# Patient Record
Sex: Male | Born: 1935
Health system: Southern US, Community
[De-identification: ages and names within clinical notes are randomized; demographics above are authoritative.]

## PROBLEM LIST (undated history)

## (undated) DIAGNOSIS — R7301 Impaired fasting glucose: Secondary | ICD-10-CM

## (undated) DIAGNOSIS — H9190 Unspecified hearing loss, unspecified ear: Secondary | ICD-10-CM

## (undated) DIAGNOSIS — I2510 Atherosclerotic heart disease of native coronary artery without angina pectoris: Secondary | ICD-10-CM

## (undated) DIAGNOSIS — Z973 Presence of spectacles and contact lenses: Secondary | ICD-10-CM

## (undated) DIAGNOSIS — I629 Nontraumatic intracranial hemorrhage, unspecified: Secondary | ICD-10-CM

## (undated) DIAGNOSIS — K5790 Diverticulosis of intestine, part unspecified, without perforation or abscess without bleeding: Secondary | ICD-10-CM

## (undated) DIAGNOSIS — I447 Left bundle-branch block, unspecified: Secondary | ICD-10-CM

## (undated) DIAGNOSIS — G479 Sleep disorder, unspecified: Secondary | ICD-10-CM

## (undated) DIAGNOSIS — M4306 Spondylolysis, lumbar region: Secondary | ICD-10-CM

## (undated) DIAGNOSIS — K2970 Gastritis, unspecified, without bleeding: Secondary | ICD-10-CM

## (undated) DIAGNOSIS — I5032 Chronic diastolic (congestive) heart failure: Secondary | ICD-10-CM

## (undated) DIAGNOSIS — G43909 Migraine, unspecified, not intractable, without status migrainosus: Secondary | ICD-10-CM

## (undated) DIAGNOSIS — N289 Disorder of kidney and ureter, unspecified: Secondary | ICD-10-CM

## (undated) DIAGNOSIS — D696 Thrombocytopenia, unspecified: Secondary | ICD-10-CM

## (undated) DIAGNOSIS — Z87898 Personal history of other specified conditions: Secondary | ICD-10-CM

## (undated) DIAGNOSIS — R51 Headache: Secondary | ICD-10-CM

## (undated) DIAGNOSIS — I77810 Thoracic aortic ectasia: Secondary | ICD-10-CM

## (undated) DIAGNOSIS — K279 Peptic ulcer, site unspecified, unspecified as acute or chronic, without hemorrhage or perforation: Secondary | ICD-10-CM

## (undated) DIAGNOSIS — E785 Hyperlipidemia, unspecified: Secondary | ICD-10-CM

## (undated) DIAGNOSIS — I119 Hypertensive heart disease without heart failure: Secondary | ICD-10-CM

## (undated) DIAGNOSIS — K635 Polyp of colon: Secondary | ICD-10-CM

## (undated) DIAGNOSIS — I10 Essential (primary) hypertension: Secondary | ICD-10-CM

## (undated) DIAGNOSIS — R251 Tremor, unspecified: Secondary | ICD-10-CM

## (undated) DIAGNOSIS — I639 Cerebral infarction, unspecified: Secondary | ICD-10-CM

## (undated) DIAGNOSIS — G4700 Insomnia, unspecified: Secondary | ICD-10-CM

## (undated) DIAGNOSIS — K449 Diaphragmatic hernia without obstruction or gangrene: Secondary | ICD-10-CM

## (undated) DIAGNOSIS — R338 Other retention of urine: Secondary | ICD-10-CM

## (undated) DIAGNOSIS — G20A1 Parkinson's disease without dyskinesia, without mention of fluctuations: Secondary | ICD-10-CM

## (undated) DIAGNOSIS — M1990 Unspecified osteoarthritis, unspecified site: Secondary | ICD-10-CM

## (undated) DIAGNOSIS — G20 Parkinson's disease: Secondary | ICD-10-CM

## (undated) DIAGNOSIS — N390 Urinary tract infection, site not specified: Secondary | ICD-10-CM

## (undated) DIAGNOSIS — N401 Enlarged prostate with lower urinary tract symptoms: Secondary | ICD-10-CM

## (undated) HISTORY — PX: ESOPHAGOGASTRODUODENOSCOPY: SHX1529

## (undated) HISTORY — DX: Cerebral infarction, unspecified: I63.9

## (undated) HISTORY — DX: Chronic diastolic (congestive) heart failure: I50.32

## (undated) HISTORY — DX: Diverticulosis of intestine, part unspecified, without perforation or abscess without bleeding: K57.90

## (undated) HISTORY — DX: Peptic ulcer, site unspecified, unspecified as acute or chronic, without hemorrhage or perforation: K27.9

## (undated) HISTORY — DX: Tremor, unspecified: R25.1

## (undated) HISTORY — DX: Personal history of other specified conditions: Z87.898

## (undated) HISTORY — DX: Atherosclerotic heart disease of native coronary artery without angina pectoris: I25.10

## (undated) HISTORY — DX: Thrombocytopenia, unspecified: D69.6

## (undated) HISTORY — DX: Urinary tract infection, site not specified: N39.0

## (undated) HISTORY — DX: Hypertensive heart disease without heart failure: I11.9

## (undated) HISTORY — DX: Essential (primary) hypertension: I10

## (undated) HISTORY — DX: Parkinson's disease without dyskinesia, without mention of fluctuations: G20.A1

## (undated) HISTORY — DX: Hyperlipidemia, unspecified: E78.5

## (undated) HISTORY — DX: Migraine, unspecified, not intractable, without status migrainosus: G43.909

## (undated) HISTORY — PX: CORONARY ANGIOPLASTY: SHX604

## (undated) HISTORY — DX: Parkinson's disease: G20

## (undated) HISTORY — DX: Left bundle-branch block, unspecified: I44.7

## (undated) HISTORY — DX: Disorder of kidney and ureter, unspecified: N28.9

## (undated) HISTORY — DX: Gastritis, unspecified, without bleeding: K29.70

## (undated) HISTORY — DX: Insomnia, unspecified: G47.00

## (undated) HISTORY — PX: WISDOM TOOTH EXTRACTION: SHX21

## (undated) HISTORY — DX: Nontraumatic intracranial hemorrhage, unspecified: I62.9

## (undated) HISTORY — DX: Sleep disorder, unspecified: G47.9

## (undated) HISTORY — PX: CATARACT EXTRACTION, BILATERAL: SHX1313

## (undated) HISTORY — DX: Thoracic aortic ectasia: I77.810

## (undated) HISTORY — DX: Spondylolysis, lumbar region: M43.06

## (undated) HISTORY — DX: Diaphragmatic hernia without obstruction or gangrene: K44.9

## (undated) HISTORY — DX: Impaired fasting glucose: R73.01

## (undated) HISTORY — PX: OTHER SURGICAL HISTORY: SHX169

## (undated) HISTORY — DX: Polyp of colon: K63.5

---

## 1958-06-29 HISTORY — PX: APPENDECTOMY: SHX54

## 1996-06-29 HISTORY — PX: CHOLECYSTECTOMY: SHX55

## 2001-10-27 HISTORY — PX: COLONOSCOPY: SHX174

## 2001-10-31 ENCOUNTER — Ambulatory Visit (HOSPITAL_COMMUNITY): Admission: RE | Admit: 2001-10-31 | Discharge: 2001-10-31 | Payer: Self-pay | Admitting: *Deleted

## 2002-06-02 ENCOUNTER — Encounter: Payer: Self-pay | Admitting: Internal Medicine

## 2002-06-03 ENCOUNTER — Inpatient Hospital Stay (HOSPITAL_COMMUNITY): Admission: EM | Admit: 2002-06-03 | Discharge: 2002-06-07 | Payer: Self-pay | Admitting: Emergency Medicine

## 2002-06-04 ENCOUNTER — Encounter: Payer: Self-pay | Admitting: Internal Medicine

## 2002-06-06 ENCOUNTER — Encounter: Payer: Self-pay | Admitting: Internal Medicine

## 2002-06-08 ENCOUNTER — Emergency Department (HOSPITAL_COMMUNITY): Admission: EM | Admit: 2002-06-08 | Discharge: 2002-06-09 | Payer: Self-pay | Admitting: Emergency Medicine

## 2003-03-04 ENCOUNTER — Encounter: Payer: Self-pay | Admitting: Family Medicine

## 2003-03-04 ENCOUNTER — Encounter: Admission: RE | Admit: 2003-03-04 | Discharge: 2003-03-04 | Payer: Self-pay | Admitting: Family Medicine

## 2003-03-14 ENCOUNTER — Encounter: Admission: RE | Admit: 2003-03-14 | Discharge: 2003-06-12 | Payer: Self-pay | Admitting: Obstetrics and Gynecology

## 2003-06-30 HISTORY — PX: CORONARY ARTERY BYPASS GRAFT: SHX141

## 2003-07-30 ENCOUNTER — Ambulatory Visit (HOSPITAL_COMMUNITY): Admission: RE | Admit: 2003-07-30 | Discharge: 2003-07-30 | Payer: Self-pay | Admitting: Family Medicine

## 2003-11-30 ENCOUNTER — Inpatient Hospital Stay (HOSPITAL_COMMUNITY): Admission: EM | Admit: 2003-11-30 | Discharge: 2003-12-11 | Payer: Self-pay | Admitting: Emergency Medicine

## 2004-01-07 ENCOUNTER — Encounter
Admission: RE | Admit: 2004-01-07 | Discharge: 2004-01-07 | Payer: Self-pay | Admitting: Thoracic Surgery (Cardiothoracic Vascular Surgery)

## 2004-01-14 ENCOUNTER — Encounter (HOSPITAL_COMMUNITY): Admission: RE | Admit: 2004-01-14 | Discharge: 2004-04-13 | Payer: Self-pay | Admitting: Cardiology

## 2004-01-22 ENCOUNTER — Inpatient Hospital Stay (HOSPITAL_COMMUNITY): Admission: EM | Admit: 2004-01-22 | Discharge: 2004-01-24 | Payer: Self-pay | Admitting: *Deleted

## 2004-05-07 ENCOUNTER — Ambulatory Visit (HOSPITAL_COMMUNITY): Admission: RE | Admit: 2004-05-07 | Discharge: 2004-05-07 | Payer: Self-pay | Admitting: Specialist

## 2004-05-10 ENCOUNTER — Inpatient Hospital Stay (HOSPITAL_COMMUNITY): Admission: EM | Admit: 2004-05-10 | Discharge: 2004-05-15 | Payer: Self-pay | Admitting: Emergency Medicine

## 2004-06-25 ENCOUNTER — Ambulatory Visit (HOSPITAL_COMMUNITY): Admission: RE | Admit: 2004-06-25 | Discharge: 2004-06-25 | Payer: Self-pay | Admitting: Specialist

## 2004-06-30 ENCOUNTER — Observation Stay (HOSPITAL_COMMUNITY): Admission: EM | Admit: 2004-06-30 | Discharge: 2004-07-01 | Payer: Self-pay | Admitting: Emergency Medicine

## 2004-09-26 IMAGING — CR DG CHEST 2V
2 series · 2 of 2 positions shown · non-contrast
Comparison: none

CLINICAL DATA: 68 year old status post CABG.  
 TWO VIEW CHEST 
 Comparison 12/08/03.
 Patient has had median sternotomy and CABG.  The cardiac size is upper limits of normal.  No focal consolidations or pleural effusions are identified.  There is minimal bibasilar atelectasis.
 IMPRESSION
 Minimal bibasilar atelectasis.

[view not recorded (1 of 2)]
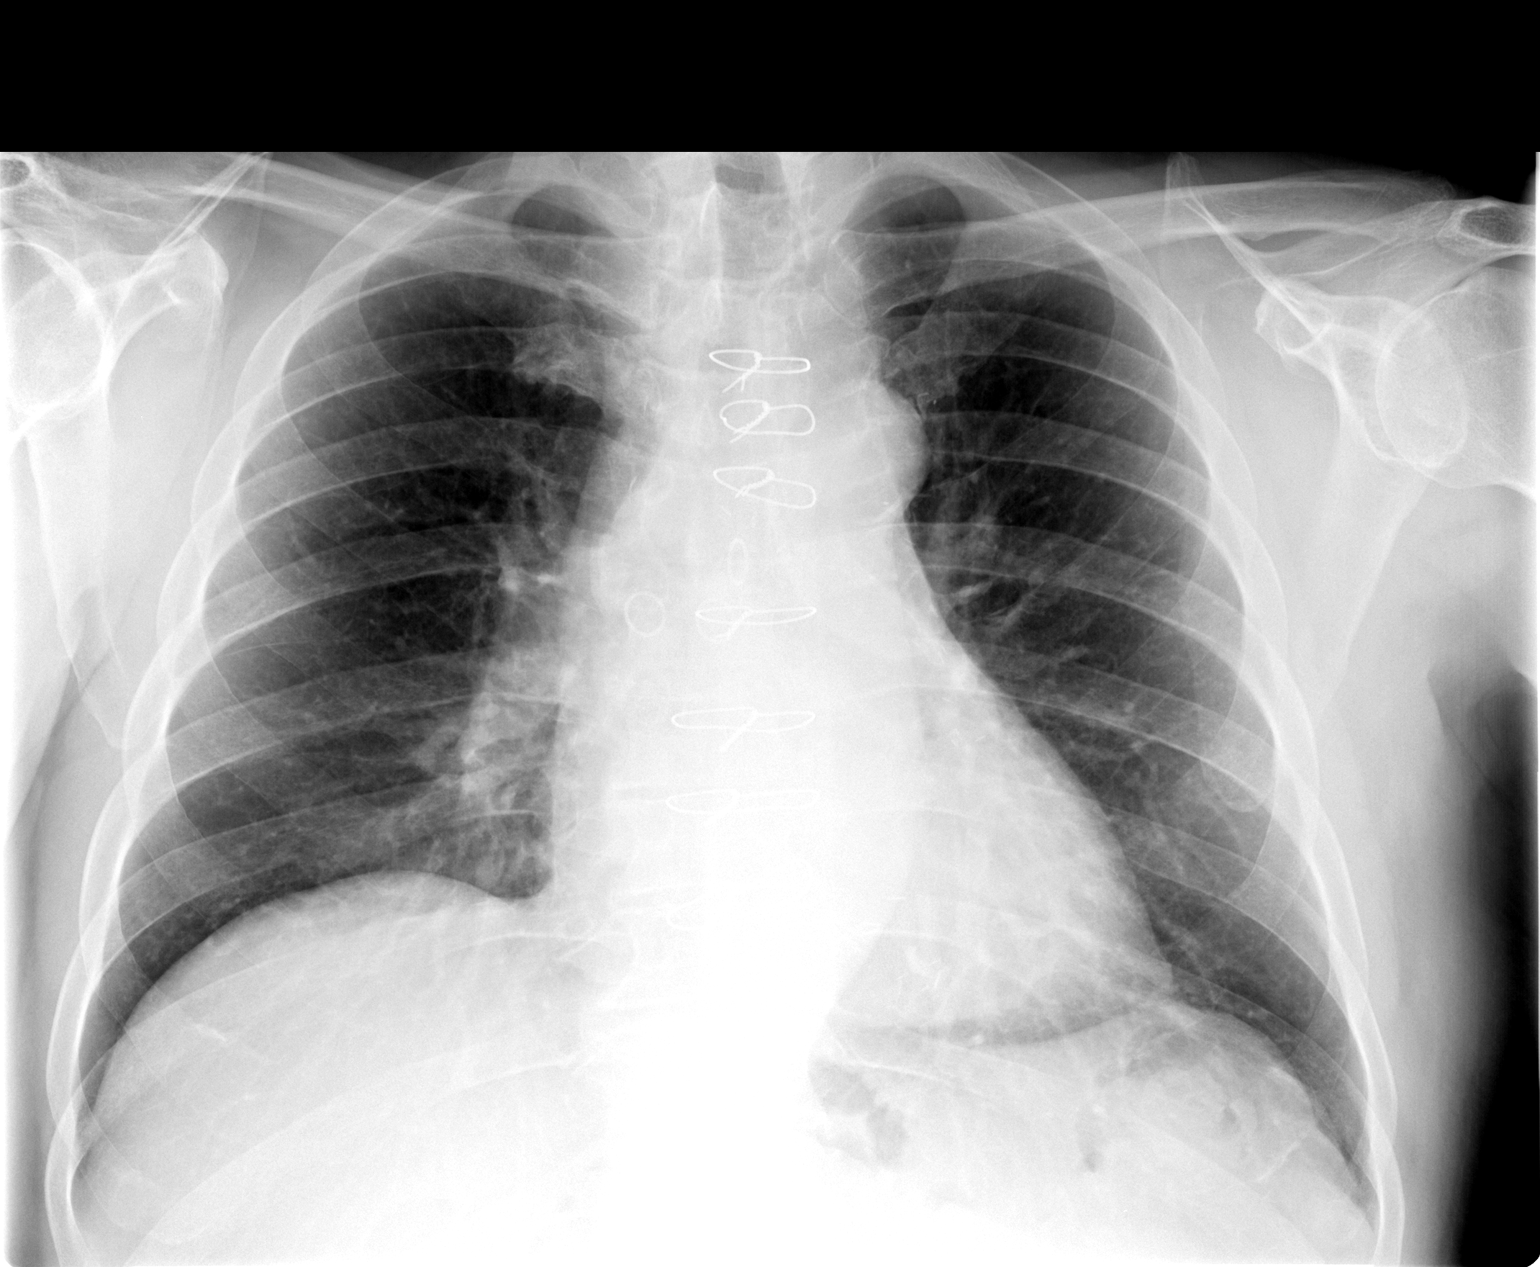

[view not recorded (2 of 2)]
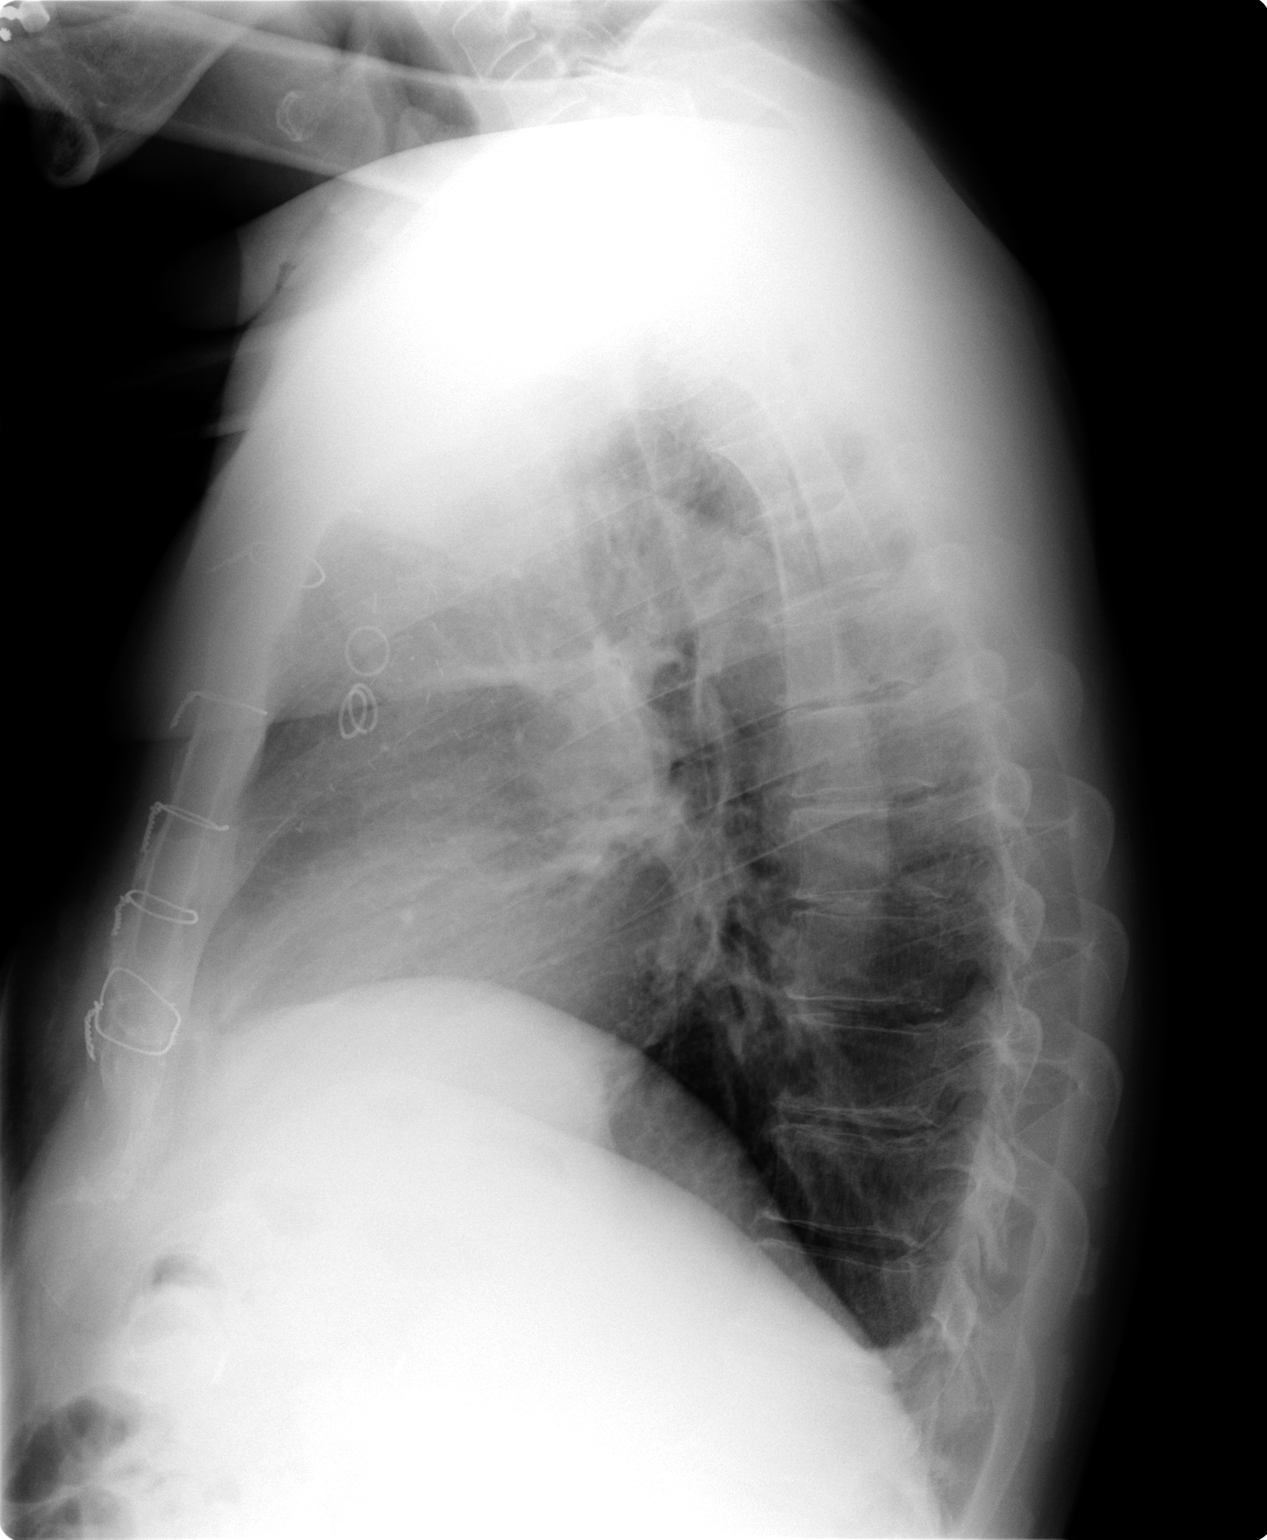

[2 of 2 positions shown; findings below may reference images not displayed]

## 2004-10-06 ENCOUNTER — Observation Stay (HOSPITAL_COMMUNITY): Admission: EM | Admit: 2004-10-06 | Discharge: 2004-10-07 | Payer: Self-pay | Admitting: Emergency Medicine

## 2008-03-08 ENCOUNTER — Encounter: Admission: RE | Admit: 2008-03-08 | Discharge: 2008-03-08 | Payer: Self-pay | Admitting: Gastroenterology

## 2008-03-14 HISTORY — PX: COLON SURGERY: SHX602

## 2008-04-08 ENCOUNTER — Encounter: Admission: RE | Admit: 2008-04-08 | Discharge: 2008-04-08 | Payer: Self-pay | Admitting: Gastroenterology

## 2008-11-15 ENCOUNTER — Encounter: Admission: RE | Admit: 2008-11-15 | Discharge: 2008-11-15 | Payer: Self-pay | Admitting: Cardiology

## 2008-11-19 ENCOUNTER — Ambulatory Visit (HOSPITAL_COMMUNITY): Admission: RE | Admit: 2008-11-19 | Discharge: 2008-11-19 | Payer: Self-pay | Admitting: Cardiology

## 2008-11-29 ENCOUNTER — Ambulatory Visit (HOSPITAL_COMMUNITY): Admission: RE | Admit: 2008-11-29 | Discharge: 2008-11-30 | Payer: Self-pay | Admitting: Interventional Cardiology

## 2009-02-27 HISTORY — PX: SHOULDER ARTHROSCOPY: SHX128

## 2009-03-28 ENCOUNTER — Ambulatory Visit (HOSPITAL_COMMUNITY): Admission: RE | Admit: 2009-03-28 | Discharge: 2009-03-28 | Payer: Self-pay | Admitting: Orthopaedic Surgery

## 2009-03-29 ENCOUNTER — Emergency Department (HOSPITAL_COMMUNITY): Admission: EM | Admit: 2009-03-29 | Discharge: 2009-03-29 | Payer: Self-pay | Admitting: Emergency Medicine

## 2010-07-19 ENCOUNTER — Encounter: Payer: Self-pay | Admitting: Family Medicine

## 2010-07-20 ENCOUNTER — Encounter: Payer: Self-pay | Admitting: Endocrinology

## 2010-09-03 ENCOUNTER — Other Ambulatory Visit: Payer: Self-pay | Admitting: Family Medicine

## 2010-09-03 DIAGNOSIS — R1030 Lower abdominal pain, unspecified: Secondary | ICD-10-CM

## 2010-09-08 ENCOUNTER — Ambulatory Visit
Admission: RE | Admit: 2010-09-08 | Discharge: 2010-09-08 | Disposition: A | Discharge status: Home / Self Care | Payer: 59 | Source: Ambulatory Visit | Attending: Family Medicine | Admitting: Family Medicine

## 2010-09-08 DIAGNOSIS — R1030 Lower abdominal pain, unspecified: Secondary | ICD-10-CM

## 2010-09-08 MED ORDER — IOHEXOL 300 MG/ML  SOLN
125.0000 mL | Freq: Once | INTRAMUSCULAR | Status: AC | PRN
  Administered 2010-09-08: 125 mL via INTRAVENOUS

## 2010-10-02 LAB — URINALYSIS, ROUTINE W REFLEX MICROSCOPIC
Glucose, UA: NEGATIVE mg/dL
Ketones, ur: NEGATIVE mg/dL
pH: 6 (ref 5.0–8.0)

## 2010-10-03 LAB — BASIC METABOLIC PANEL
BUN: 20 mg/dL (ref 6–23)
Chloride: 104 mEq/L (ref 96–112)
GFR calc Af Amer: >60 mL/min (ref >60)
Potassium: 4.4 mEq/L (ref 3.5–5.1)
Sodium: 141 mEq/L (ref 135–145)

## 2010-10-03 LAB — CBC
HCT: 46.9 % (ref 39.0–52.0)
Hemoglobin: 16.3 g/dL (ref 13.0–17.0)
MCV: 95.7 fL (ref 78.0–100.0)
RBC: 4.9 MIL/uL (ref 4.22–5.81)
WBC: 7 10*3/uL (ref 4.0–10.5)

## 2010-10-06 LAB — BASIC METABOLIC PANEL
BUN: 16 mg/dL (ref 6–23)
CO2: 28 mEq/L (ref 19–32)
Chloride: 109 mEq/L (ref 96–112)
Creatinine, Ser: 1.33 mg/dL (ref 0.4–1.5)

## 2010-10-06 LAB — CBC
HCT: 41.1 % (ref 39.0–52.0)
MCHC: 34.8 g/dL (ref 30.0–36.0)
MCV: 95.4 fL (ref 78.0–100.0)
Platelets: 107 10*3/uL — ABNORMAL LOW (ref 150–400)

## 2010-10-07 LAB — CREATININE, SERUM: GFR calc Af Amer: >60 mL/min (ref >60)

## 2010-11-11 NOTE — Cardiovascular Report (Signed)
NAME:  Darryl [name], DIPIERO NO.:  [id number]   MEDICAL RECORD NO.:  [id number]          PATIENT TYPE:  INP   LOCATION:  2506                         FACILITY:  [facility]   PHYSICIAN:  Lyn Records, M.D.   DATE OF BIRTH:  09/08/1934   DATE OF PROCEDURE:  DATE OF DISCHARGE:                            CARDIAC CATHETERIZATION   INDICATION FOR THIS PROCEDURE:  Abnormal Cardiolite with inferior wall  ischemia in the distribution of a large obtuse marginal branch in the  distal circumflex territory supplied by a 90% obstructed vessel.   PROCEDURE PERFORMED:  Bare-metal stent distal circumflex/obtuse marginal  #3.   DESCRIPTION:  The patient and wife had conference with me approximately  1 hour prior to the procedure.  He received 100 mg of Plavix in the  holding area/short stay.  We discussed the possibility of drug-eluting  versus bare-metal stents.  He preferred to do whatever would be best for  him and left it to my judgment.   We placed a 6-French sheath using the modified Seldinger technique into  the right femoral artery after 1% Xylocaine.  A 6-French CLS coronary  guide system was used for the left coronary intervention.  An Angiomax  bolus and infusion was started.  ACT was appropriately elevated above  300.   Asahi Prowater wire was then used to cross the stenosis in the distal  circumflex/third obtuse marginal.  This wire was positioned distally.  We predilated with the 20-mm long Apex 2.5-mm diameter balloon.  We then  deployed a 28-mm long Liberte bare-metal stent.  We postdilated with a  3.25 x 20 mm long Voyager [place] balloon to 13 atmospheres.  We also  postdilated the proximal one half of the stent with a 12-mm long, 3.5-mm  diameter Voyager Nord to 13 atmospheres.  Flow was brisk.  After much  discussion, we did not perform PCI on the mid circumflex because of  multiple branches that arise from this region and also because we did  not feel this lesion was  clinically significant.  The patient tolerated  the procedure without complications.  Occlusion of this vessel produced  no symptoms.  Angio-Seal was used with hemostasis.   CONCLUSION:  1. Successful distal circumflex/obtuse marginal #3 bare-metal stenting      up to 3.5 mm in diameter in the proximal portion of the stent and      3.25 distally with TIMI grade 3 flow.  2. Residual 50% midcircumflex stenosis, not felt to be hemodynamically      significant.  3. No development of symptoms with vessel occlusion.   PLAN:  Aspirin and Plavix for 60 days.  Plavix therapy could then  potentially be discontinued, and the patient's shoulder could be  operated upon.  Further management per Dr. Armanda Magic.      Lyn Records, M.D.  Electronically Signed     HWS/MEDQ  D:  11/29/2008  T:  11/30/2008  Job:  [id number]   cc:   Armanda Magic, M.D.  Anna Genre Little, M.D.

## 2010-11-11 NOTE — Discharge Summary (Signed)
NAME:  Darryl [name], Darryl [name] NO.:  [id number]   MEDICAL RECORD NO.:  [id number]          PATIENT TYPE:  OIB   LOCATION:  2506                         FACILITY:  [facility]   PHYSICIAN:  Lyn Records, M.D.   DATE OF BIRTH:  1934-12-07   DATE OF ADMISSION:  11/29/2008  DATE OF DISCHARGE:  06/74/2010                               DISCHARGE SUMMARY   DISCHARGE DIAGNOSES:  1. Coronary artery disease status post percutaneous coronary      intervention to the circumflex with a bare-metal stent on November 29, 2008, by Dr. Verdis Prime.  2. Dyslipidemia.  3. Hypertension.  4. Known coronary artery disease.  5. History of gastric ulcer.  6. Chronic left bundle-branch block.  7. Normal ejection fraction.   Mr. Code is a 76 year old male patient who had recently been having  exertional fatigue.  He had a nuclear stress test which showed  reversible defect in the inferior wall extending from the base of the  heart.  He initially came into the hospital for cardiac catheterization  on Nov 19, 2008, and this showed three-vessel disease.  Of note, the  patient has coronary artery bypass grafts:  LIMA to LAD, SVG to  diagonal, and these were both patent.  The saphenous vein graft to the  PDA was occluded.  The radial artery to the obtuse marginal 2 was also  occluded.  There was obstruction of the distal circumflex of 90%.  The  patient was brought back to the hospital on June 3, 74 2010, and a bare-  metal stent was implanted in to this area without difficulty.   The patient remained in the hospital overnight and was ready for  discharge to home the following day.   DISCHARGE LABORATORY DATA:  Sodium 140, potassium 4.1, BUN 6, creatinine  1.3.  Hemoglobin 14.3, hematocrit 41.9, white count 6.8, platelets 107.  Last chest x-ray was on Nov 15, 2008, that showed no active  cardiopulmonary disease.   DISCHARGE MEDICATIONS:  1. Plavix 75 mg a day.  2. Enteric-coated aspirin 325 mg a  day.  3. Protonix 40 mg a day.  4. Xanax p.r.n.  5. Avapro daily.  6. Hydrochlorothiazide 25 mg.  7. Lopressor as before.  8. Lisinopril daily.  9. Hytrin 1 a day.  10.Clonidine as before.  11.Fish oil daily.  12.Temazepam 15 mg at bedtime.  13.All vitamins daily.  14.Sublingual nitroglycerin as needed.  15.Imdur 30 mg a day.   The patient is to remain on a low-sodium, heart-healthy diet.  Clean  cath site gently with soap and water.  No scrubbing.  Increase activity  slowly as per Cardiac Rehabilitation.  No lifting over 1 week over 10  pounds.  No driving for 2 days.  Call Dr. Mayford Knife with any further  questions or concerns.  The patient has a followup appointment with her  on December 14, 2008, at 12:45 p.m.  The patient is being discharged to home  in stable but improved condition.      Guy Franco, P.A.      Lyn Records,  M.D.  Electronically Signed    LB/MEDQ  D:  06/74/2010  T:  06/74/2010  Job:  [id number]   cc:   Armanda Magic, M.D.  Anna Genre Little, M.D.

## 2010-11-11 NOTE — Cardiovascular Report (Signed)
NAME:  Darryl [name], Darryl [name] NO.:  [id number]   MEDICAL RECORD NO.:  [id number]          PATIENT TYPE:  OIB   LOCATION:  2899                         FACILITY:  [facility]   PHYSICIAN:  Armanda Magic, M.D.     DATE OF BIRTH:  03-10-1935   DATE OF PROCEDURE:  11/19/2008  DATE OF DISCHARGE:  11/19/2008                            CARDIAC CATHETERIZATION   PROCEDURES:  Left heart catheterization, coronary angiography, LIMA  angiography, saphenous vein graft angiography.   OPERATOR:  Armanda Magic, M.D.   INDICATIONS:  Exertional fatigue, CAD, abnormal myocardial perfusion  scan.   COMPLICATIONS:  None.   IV ACCESS:  Via right femoral artery 6-French sheath.   IV MEDICATIONS:  Versed 1 mg, fentanyl 25 mcg.   This is a very pleasant 76 year old male who has a history of coronary  artery disease, status post coronary artery bypass grafting in early  2005 and then 6 months later was found to have 2 occluded grafts and  underwent radial artery to the OM, PCI as well as saphenous vein graft  to the PDA, PCI by Dr. Katrinka Blazing.  He now presents with exertional fatigue  and a myocardial perfusion scan which showed a reversible defect in the  inferior wall.  He presents now for cardiac catheterization.   The patient was brought to the cardiac catheterization laboratory in the  fasting nonsedated state.  Informed consent was obtained.  The patient  was connected to continuous heart rate and pulse oximetry monitoring and  intermittent blood pressure monitoring.  The right groin was prepped and  draped in a sterile fashion.  Xylocaine 1% was used for local  anesthesia.  Using the modified Seldinger technique, a 5-French sheath  was placed in right femoral artery.  Under fluoroscopic guidance, a 5-  [place] JL4 catheter was placed in the vicinity of the left coronary  artery, but could not engage the coronary ostium.  The catheter was  exchanged out for a 5-French JL5 catheter which  successfully engaged the  coronary ostium.  Multiple cine films were taken at 30-degree RAO and 40-  degree LAO views.  This catheter was then exchanged out over a guidewire  for a 5-French JR4 catheter which engaged the right coronary ostium.  Multiple cine films were taken at 30-degree RAO and 40-degree LAO views.  This catheter was then pulled back into the ostium of the saphenous vein  graft to the obtuse marginal and cine angiography revealed an occluded  vein graft.  Catheter was then pulled out of that graft and placed into  the saphenous vein graft to the diagonal.  Multiple cine films were  taken at 30-degree RAO and 40-degree LAO views.  This catheter was then  pulled out of the vein graft ostium and attempted to be placed into the  vein graft to the PDA without success.  The catheter was then pulled  into the aortic arch and advanced into the left subclavian artery.  It  could not be advanced further than about a centimeter and a long  exchange wire was used again with difficulty in trying to extend the  wire into the subclavian artery.  The catheter was removed over a wire.  A 5-French pigtail catheter was placed under fluoroscopic guidance in  the left ventricular cavity.  Left ventricular pressure was measured.  The catheter was then pulled back across the aortic valve with no  significant gradient noted.  The catheter was then removed over a  guidewire.  Left ventriculography was not performed because of the  amount of contrast that was used for this procedure and the need to  still find the LIMA graft as well as the saphenous vein graft to the  PDA.  After multiple attempts with a 5-French RCB catheter, the vein  graft to the PDA could not be cannulated.  My colleague, Dr. Katrinka Blazing,  stepped in and placed a 5-French multipurpose catheter into the vicinity  of the saphenous vein graft to the PDA which successfully cannulated and  multiple cine films were taken at 30-degree RAO  and 40-degree LAO views  revealing an occluded ostium to the vein graft.  This catheter was then  exchanged out over a guidewire and the femoral sheath was upgraded to a  6-French sheath.  A 6-French JR4 catheter was placed into the aortic  arch and advanced into the left subclavian artery.  Still after multiple  attempts at passing a guidewire, it could not be passed through the  sheath in the subclavian artery.  The wire was exchanged out for a  glidewire which successfully was able to extend into the left subclavian  artery and the catheter was exchanged out for a 5-French LIMA catheter.  After multiple attempts at cannulating the LIMA graft, it was  successfully cannulated.  Multiple cine films were taken at 30-degree  RAO and 40-degree LAO views.  This catheter was then removed over a  guidewire.  At the end of procedure, all catheters and sheaths were  removed.  Manual compression was performed, adequate hemostasis was  obtained.  The patient was transferred back to room in stable condition.   RESULTS:  Left ventricular pressure 157/70 mmHg, aortic pressure 151/72  mmHg, LVEDP 14 mmHg.   The right coronary artery was diffusely diseased throughout the entire  vessel.  In the proximal portion, there was a large aneurysmal component  and proximal and distal to this was diffusely diseased up to 80%.  In  the distal portion, there was an 80% discrete lesion.   The left main coronary artery was widely patent and bifurcated into left  anterior descending artery and left circumflex artery.   Left anterior descending artery had 2 proximal sequential 80% stenoses  and then gave rise to a first diagonal branch.  The diagonal branch was  heavily diseased up to 80% in the proximal portion.  There was no  evidence of retrograde flow through this via saphenous vein graft to the  diagonal graft.  The ongoing LAD was occluded just past the diagonal.   The left circumflex traversed the AV groove  and gave rise to a first  obtuse marginal branch which was with a moderate-sized vessel and widely  patent.  The second obtuse marginal had a 50% ostial stenosis and then  an aneurysmal segment which was rather long and then a 70% stenosis  distal to that.  The left circumflex then appeared to terminate in a  very large posterior descending artery branch where at least a vessel  that had septal perforators coming off of this and extended across the  inferior wall.   The  LIMA to the LAD was widely patent and the LAD was patent past the  graft insertion site.  This radial artery to the OM-2 was occluded.  The  saphenous vein graft to the PDA was occluded.  The saphenous vein graft  to the diagonal was widely patent.   ASSESSMENT:  1. Severe three-vessel coronary artery disease, status post coronary      artery bypass graft, left internal mammary artery graft to the left      anterior descending patent, saphenous vein graft to the diagonal      patent, saphenous vein graft to the posterior descending artery      occluded, radial artery to the obtuse marginal 2 occluded.  2. Mild left ventricular dysfunction by Cardiolite, ejection fraction      48%.  3. Exertional fatigue secondary to severe three-vessel coronary artery      disease.   PLAN:  Films were reviewed with Dr. Katrinka Blazing.  The patient has not had any  angina and only has exertional fatigue.  At this time, since his LIMA  graft and diagonal are patent and there does appear to be flow down the  circumflex and OM system, would manage with anti-anginal medications at  this time.  We will continue beta-blocker, ARB, and  ACE inhibitor.  I will add Imdur 30 mg a day to maximize coronary blood  flow.  He will be discharged to home after IV fluid and bedrest, and  follow up with me in 2 weeks.  If he did not get significant improvement  after adding Imdur, may consider Ranexa.      Armanda Magic, M.D.  Electronically Signed      TT/MEDQ  D:  11/20/2008  T:  11/21/2008  Job:  [id number]   cc:   Caryn Bee L. Little, M.D.

## 2010-11-14 NOTE — H&P (Signed)
NAME:  [name], [name] NO.:  [id number]   MEDICAL RECORD NO.:  [id number]          PATIENT TYPE:  INP   LOCATION:  1832                         FACILITY:  [facility]   PHYSICIAN:  Ulyses Amor, MD DATE OF BIRTH:  Apr 13, 1935   DATE OF ADMISSION:  10/06/2004  DATE OF DISCHARGE:                                HISTORY & PHYSICAL   HISTORY OF PRESENT ILLNESS:  Darryl [name] is a 76 year old white man who  was admitted to [facility] for further evaluation of chest pain.  The patient has a history of coronary artery disease which dates back to  June of 2005.  At that time, he underwent four vessel coronary artery bypass  graft surgery.  In November of 2005, he returned with chest pain.  He  underwent stent placement in the radial graft to the second obtuse marginal  as well as the saphenous vein graft to the right coronary artery.  His  cardiac course has been uncomplicated since then, though, he has continued  to complain of chest pain.  The chest pain that he has complained of in  recent months is a soreness in a focal area in his left anterior upper  chest.  The patient experienced a similar such pain tonight.  However, it  was associated with an ache in the center of his chest.  This began this  morning while he was sitting and lasted throughout the course of the day.  There were no exacerbating or ameliorating factors.  The discomfort appeared  not to be related to position, activity, meals, or respirations.  He  presented to the emergency department.  His discomfort has largely resolved  since then.  What prompted the patient to present to the emergency  department was the associated central component which was a new addition to  his usual left anterior chest discomfort.   The patient has no history of congestive heart failure or arrhythmia.  He  did have a heart attack prior to his coronary artery bypass graft surgery.   The patient has a history of  hypertension.  There is also a strong family  history of early coronary artery disease (father in his 62's as well as a  sister).  There is no history of diabetes mellitus, smoking, or  dyslipidemia.   PAST MEDICAL HISTORY:  Also notable for a gastrointestinal bleed in July of  2005 which was found to be due to gastric ulcers.  He was hospitalized in  January of this year for gastritis, though, he did not bleed at that time.   Also notable for a basal ganglia stroke.   PAST SURGICAL HISTORY:  Cholecystectomy, appendectomy, coronary artery  bypass graft surgery.   FAMILY HISTORY:  His mother's history is unknown.  His father died of a  myocardial infarction in his 35s as noted above.  His sister also has  coronary artery disease.   SOCIAL HISTORY:  The patient lives with his wife.  He neither smokes nor  drinks.  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   MEDICATIONS:  1.  Avapro.  2.  Metoprolol.  3.  Terazosin.  4.  Hydrochlorothiazide.  5.  Plavix.  6.  Protonix.  7.  Darvocet-N 100.   ALLERGIES:  No known drug allergies.   REVIEW OF SYSTEMS:  No new problems related to his head, eyes, ears, nose,  mouth, throat, lungs, gastrointestinal system, genitourinary system, or  extremities.  There is no history of neurologic or psychiatric disorder.  There is no history of fever, chills, or weight loss.   PHYSICAL EXAMINATION:  VITAL SIGNS:  Blood pressure 132/82, pulse 71 and  regular, respirations 18, temperature 98.7.  GENERAL:  The patient is an older white man in no discomfort.  He is alert,  oriented, appropriate, and responsive.  HEENT:  Normal.  NECK:  Without thyromegaly or adenopathy.  Carotid pulses were palpable  bilaterally and without bruits.  HEART:  Normal S1 and S2.  There was no S3, S4, murmur, rub, or click.  Cardiac rhythm was regular.  Palpation of the left anterior upper chest  reproduced the patient's chest discomfort exactly.  LUNGS:  Clear.  ABDOMEN:  Soft and  nontender.  There was no mass, hepatosplenomegaly, bruit,  distention, rebound, guarding, or rigidity.  Bowel sounds were normal.  RECTAL:  GENITOURINARY:  Not performed as they were not pertinent to the  reason for acute care hospitalization.  EXTREMITIES:  Without edema, deviation, or deformity.  Radial and dorsalis  pedal pulses were palpable bilaterally.  NEUROLOGY:  Brief screening neurologic survey was unremarkable.   The electrocardiogram revealed normal sinus rhythm and left bundle branch  block.  Left axis deviation was noted.  The chest radiograph, according to  the radiologist, was normal.   LABORATORY DATA:  Potassium 3.5, BUN 28, and creatinine 2.0.  Initial set of  cardiac markers revealed CK-MB of 1.6, myoglobin 188, and troponin less than  0.05.  The second set revealed the CK-MB of 1.6, myoglobin 171, and troponin  less than 0.05.  The third set revealed a CK-MB of 1.4, myoglobin 176, and  troponin less than 0.05.  The remaining studies were pending at the time of  this dictation.   IMPRESSION:  1.  Chest pain; rule out cardiac ischemia.  There is a musculoskeletal      component.  2.  Coronary artery disease, status post myocardial infarction, status post      coronary artery bypass graft surgery with four grafts in June of 2005,      status post stents to the saphenous vein graft to radial artery graft in      November of 2005.  3.  Hypertension.  4.  Status post gastrointestinal bleed in July of 2005 due to gastric      ulcers; status post gastritis in January of 2006.  5.  Status post basal ganglia stroke.  6.  Renal insufficiency.   PLAN:  1.  Telemetry.  2.  Serial cardiac enzymes.  3.  Aspirin.  4.  Lovenox.  5.  Continue metoprolol and Plavix.  6.  Intravenous nitroglycerin.  7.  Further measures per Dr. Mayford Knife.      MSC/MEDQ  D:  10/06/2004  T:  10/06/2004  Job:  [id number]   cc:   Armanda Magic, M.D. [address], [address]  [place],  [place] [address]  Fax: [phone]

## 2010-11-14 NOTE — H&P (Signed)
NAME:  Darryl [name], Darryl [name] NO.:  [id number]   MEDICAL RECORD NO.:  [id number]                   PATIENT TYPE:  INP   LOCATION:  1825                                 FACILITY:  [facility]   PHYSICIAN:  Melissa L. Ladona Ridgel, MD               DATE OF BIRTH:  1935/06/02   DATE OF ADMISSION:  01/22/2004  DATE OF DISCHARGE:                                HISTORY & PHYSICAL   PRIMARY CARE PHYSICIAN:  C. Duane Lope, M.D.   CHIEF COMPLAINT:  Vomiting coffee-grounds x1 and black stools x3.   HISTORY OF PRESENT ILLNESS:  The patient is a 76 year old white male with a  past medical history for bleeding ulcers approximately four years ago,  related to Celebrex use.  He presents today with feeling quite badly this  morning, followed by two bowel movements that were black in color, followed  around 8:15 to 8:30 a.m. by a third movement and nausea with vomiting of  coffee-grounds.  He states last night he had a headache but felt generally  okay.  The patient is status post a coronary artery bypass graft surgery on  December 05, 2003, and had been taking a baby aspirin since his operation.  It  does not appear that he was taking any PPI's postoperatively.   REVIEW OF SYSTEMS:  Negative for fever, chills, nausea, vomiting.  He did  have diaphoresis with the events today.  He has had no constipation or  diarrhea.  He is sleeping okay.  He has had a weight loss of about 25-30  pounds over the last six months, which has been purposeful.  He has been  participating in cardiac rehab and denies all other review of systems, being  negative.  In particular, he states he has had no shortness of breath or  chest pain.  He does have occasional headaches.   PAST MEDICAL HISTORY:  1. Bleeding ulcers about three to four years ago, seen by Dr. Hyacinth Meeker.  This     was related to Celebrex use.  2. He recently had an MRI of his renal artery to rule out renal artery     stenosis as a secondary cause for  his hypertension.  It was reported by     the patient to be negative.  3. Hypertension.  No diabetes.  4. He has a history of a basal ganglia stroke.   PAST SURGICAL HISTORY:  1. He had a coronary artery bypass graft surgery on December 05, 2003, of two     vessels.  2. His gallbladder was removed five years ago.  3. He has had an appendectomy at age 24.  4. He had a facial melanoma removed.   ALLERGIES:  No known drug allergies.   MEDICATIONS:  1. Centrum one daily.  2. Fish oil 1000 mg daily.  3. Darvocet p.r.n.  4. Aspirin 81 mg daily.  5. Vitamin E just started this  week.  6. Restoril p.r.n.  7. Avapro 300 mg daily.  8. Clonidine 0.3 mg b.i.d.  9. Metoprolol 25 mg b.i.d.  10.      Sular 20 mg p.o. daily at lunch time.   PHYSICAL EXAMINATION:  VITAL SIGNS:  Temperature 98.3 degrees, blood  pressure 146/89, pulse 60, respirations 16.  Saturation 100% on 2 L.  Orthostatics have not been performed at this time.  GENERAL:  He is in no acute distress.  HEENT:  Normocephalic, atraumatic.  Pupils equal, round, reactive to light.  Extraocular muscles intact.  Mucous membranes moist.  His posterior pharynx  is minimally erythematous, status post vomiting.  NECK:  Supple.  No jugular venous distention, no lymph nodes.  CHEST:  Clear to auscultation.  No rales, rhonchi, or wheezes.  Please note  that he has a well-healed sternotomy scar related to his coronary artery  bypass graft surgery.  CARDIOVASCULAR:  A regular rate and rhythm.  Positive S1 and S2.  No S3 or  S4.  No murmurs, rubs, or gallops.  ABDOMEN:  Soft, nontender, nondistended, with positive bowel sounds.  No  guarding or rebound.  RECTAL:  Black stool, no masses noted.  Prostate is smooth.  There is a  little bit of perirectal irritation.  EXTREMITIES:  Show no clubbing, cyanosis, or edema.  He has a well-healed  left forearm scar and right inner thigh scar, related to his coronary artery  bypass graft surgery.   NEUROLOGIC:  Power is 5/5.  Deep tendon reflexes are 2+.  Sensation is  intact grossly.   LABORATORY DATA:  Sodium 141, potassium 4.6, chloride 108, CO2 of 23.9, BUN  43, creatinine 1.1, glucose 106.  White count 10.9, hemoglobin 11.6,  hematocrit 33.1, platelets 143.  PT and INR 13.7 and 1.1 respectively, PTT  25.  His electrocardiogram is pending.   ASSESSMENT/PLAN:  This is a 76 year old white male with new onset melena and  coffee-ground emesis, with a history of bleeding ulcers many years ago,  related to using Celebrex.  Recently he had to restart the aspirin, status  post coronary artery bypass graft surgery, and this week started vitamin E,  when today he developed melanotic stool and nausea with one episode of  vomiting.  1. Gastrointestinal:  The patient is typed and screened.  We will replete     his hemoglobin and hematocrit if his hematocrit drops to less than 25.     We have consulted [facility] for a possible     esophagogastroduodenoscopy.  Protonix intravenously has been started     b.i.d.  He is n.p.o. except for medications now.  2. Genitourinary:  There are no current issues.  I do not feel that a     urinalysis is necessary at this time.  3. Endocrine:  I spoke with Dr. Reather Littler who will hold off on any testing     for adrenal causes for elevation of his blood pressure at this time,     because the patient is under stress.  We will send a discharge summary to     Dr. Lucianne Muss on discharge, so that he can continue to work up the patient's     secondary causes for hypertension.  4. Cardiovascular:  Will continue his home medications with parameters, to     hold for decreased blood pressure or heart rate.  I have spoken with Dr.     Armanda Magic, who has okay'd him for an esophagogastroduodenoscopy at  this time.  Obviously will have to hold his aspirin. 5. Pulmonary:  There appear to be no active issues; however, with the     vomiting episode, I would  like to check a chest x-ray, to rule out     aspiration.  6. PAS hose for deep vein thrombosis prophylaxis at this time.  7. The patient is a full code and full care.                                                Melissa L. Ladona Ridgel, MD    MLT/MEDQ  D:  01/22/2004  T:  01/22/2004  Job:  [id number]   cc:   C. Duane Lope, M.D.  [address]  [place]  [place] [address]  Fax: [phone]   Armanda Magic, M.D.  [address]  [place], [place] [address]  Fax: [phone]   Petra Kuba, M.D.  [address], [address]  [place]  [place] [address]  Fax: [phone]   Reather Littler, M.D.  [address]  [place]  [place] [address]  Fax: [phone]

## 2010-11-14 NOTE — Consult Note (Signed)
NAME:  Darryl [name], Darryl [name] NO.:  [id number]   MEDICAL RECORD NO.:  [id number]                   PATIENT TYPE:  INP   LOCATION:  2313                                 FACILITY:  [facility]   PHYSICIAN:  Salvatore Decent. Cornelius Moras, M.D.              DATE OF BIRTH:  Oct 20, 1934   DATE OF CONSULTATION:  12/04/2003  DATE OF DISCHARGE:                                   CONSULTATION   REFERRING PHYSICIAN:  Dr. Armanda Magic.   PRIMARY CARE PHYSICIAN:  Dr. Vianne Bulls.   REASON FOR CONSULTATION:  Severe three-vessel coronary artery disease with  class IV unstable angina.   HISTORY OF PRESENT ILLNESS:  Darryl [name] is a 76 year old gentleman from  [place] with no previous history of coronary artery disease but risk  factors including history of hypertension, borderline hyperlipidemia, and a  strong family history of coronary artery disease.  He was in his usual state  of good health until this past Friday.  That afternoon, he had been working  in his yard, gardening, doing some fairly strenuous work.  He developed a  prolonged episode of tingling and sharp pain radiating across his chest to  both arms.  He stopped and rested and the pain seemed to go away.  He again  started to work and the pain returned.  It was associated with severe  shortness of breath and prolonged episode of pain.  He subsequently  presented to his primary care physician's office and was referred directly  to the emergency department for further management.  On arrival, he was  noted to have mildly elevated cardiac enzymes with a new left bundle branch  block on 12-lead electrocardiogram.  He was admitted to the hospital.  He  was also notably severely hypertensive.  His blood pressure was brought  under control and he was treated with heparin.  His chest pain symptoms all  resolved.  His cardiac enzymes peaked and were mildly abnormal with a peak  CK-MB fraction of 5.2, with a peak troponin I of 1.14.   Darryl [name]  underwent elective cardiac catheterization yesterday by Dr. Mayford Knife.  Findings at the time of catheterization are notable for severe three-vessel  coronary artery disease with mild left ventricular dysfunction.  This  afternoon, cardiac surgical consultation has been requested to consider  surgical revascularization.   REVIEW OF SYSTEMS:  GENERAL:  The patient reports feeling well up until this  past Friday.  He has had a good appetite.  He has lost over 20 pounds in  weight over the last 6 months on a new diet program.  CARDIAC:  The patient  denies any episodes of similar chest pain prior to that which developed this  week.  He remains active physically and has not had exertional chest pain,  chest tightness, nor shortness of breath.  He states that he only gets short  of breath if he pushes himself  with strenuous activity.  He denies any PND,  orthopnea, or lower extremity edema.  He denies any palpitations or syncopal  episode.  RESPIRATORY:  Negative.  The patient has no recent productive  cough, hemoptysis, wheezing.  GASTROINTESTINAL:  Negative.  The patient  reports normal bowel function.  He has no difficulty swallowing.  He denies  recent history of hematochezia, hematemesis, melena.  MUSCULOSKELETAL:  Negative.  The patient reports only mild arthralgias in both hands.  NEUROLOGIC:  Negative.  The patient denies any episodes of transient  monocular blindness or transient numbness or weakness involving either upper  or lower extremities.  The patient denies a history of seizures.  ENDOCRINE:  Negative.  The patient denies a history of diabetes or thyroid disorder.  INFECTIOUS:  Negative.  The patient denies recent fevers or chills.  PSYCHIATRIC:  Negative.  The patient denies recent problems with stress or  anxiety.  HEENT:  Negative.   PAST MEDICAL HISTORY:  1. Hypertension, difficult to control.  2. Remote history of peptic ulcer disease.  3. Borderline  hyperlipidemia.  4. History of reported tiny lacunar cerebral infarction related to     hypertension several years ago with no residual neurologic sequelae.   PAST SURGICAL HISTORY:  1. Appendectomy.  2. Cholecystectomy.   FAMILY HISTORY:  Family history is notable for the patient's father passed  away from acute myocardial infarction at the age of 8.  The patient's  sister has had myocardial infarction and is age 59.   SOCIAL HISTORY:  The patient is married and lives with his wife and 1  daughter here in [place].  He is semi-retired, although he still works  part-time in [employer].  He is a nonsmoker and  reports that he smoked a very brief period of time in his youth but has been  a nonsmoker for most of his life.  He denies history of excessive alcohol  consumption.   MEDICATIONS PRIOR TO ADMISSION:  1. Clonidine 0.3 mg p.o. t.i.d.  2. Avapro 300 mg p.o. daily.  3. Aspirin 81 mg p.o. every Monday, Wednesday and Friday.   DRUG ALLERGIES:  None known.   PHYSICAL EXAM:  GENERAL:  Physical exam is notable for a well-appearing male  who appears his stated age in no acute distress.  VITAL SIGNS:  Blood pressure is currently 150/80 and he is in normal sinus  rhythm by telemetry monitor.  He is 5-foot 8-1/2-inches tall and weighs 210  pounds.  HEENT:  Exam is grossly unrevealing.  NECK:  The neck is supple.  There is no cervical nor supraclavicular  lymphadenopathy.  There is no jugular venous distention.  No carotid bruits.  CHEST:  Auscultation of the chest includes clear and symmetrical breath  sounds bilaterally.  No wheezes or rhonchi are demonstrated.  CARDIOVASCULAR:  Exam reveals a regular rate and rhythm.  No murmurs, rubs,  or gallops are noted.  ABDOMEN:  The abdomen is slightly obese but soft and nontender.  The liver  edge is not palpable.  Bowel sounds are present.  There are no palpable  masses. EXTREMITIES:  The extremities are warm  and well-perfused.  There is no lower  extremity edema.  There is no sign of significant venous insufficiency.  Distal pulses are intact and easily palpable in both lower legs at the  ankle.  There is intact palmar arch circulation in the left hand with brisk  capillary refill during occlusion of the left  radial pulse.  RECTAL AND GU:  Exams are both deferred.  NEUROLOGICAL:  Examination is grossly nonfocal and symmetrical throughout.   DIAGNOSTIC TESTS:  Cardiac catheterization performed by Dr. Mayford Knife has been  reviewed.  This demonstrates critical three-vessel coronary artery disease  with mild left ventricular dysfunction.  Specifically, there is a long-  segment ulcerated 90% proximal stenosis of the left anterior descending  coronary artery, both before and after takeoff of the first diagonal branch.  There is 95% to 99% stenosis of the left anterior descending coronary artery  within the middle of this longer-segment tubular stenosis.  There is 70% to  80% proximal stenosis of a large second circumflex marginal branch.  There  is 50% to 60% proximal stenosis of the first diagonal branch.  There is 80%  to 90% ostial stenosis of the right coronary artery with large aneurysmal  post-stenotic dilatation of the proximal right coronary artery.  There is  70% to 80% stenosis in the midportion of this vessel.  There is 95% to 99%  high-grade stenosis at the proximal portion of the posterior descending  coronary artery.  Left ventricular function is mildly reduced with mild-to-  moderate inferoapical hypokinesis.  There is no mitral regurgitation.   IMPRESSION:  Critical three-vessel coronary artery disease with class IV  unstable angina, mild acute non-Q-wave myocardial infarction, longstanding  hypertension which has not been well-controlled at the time of this  hospitalization.  I believe that Mr. Cerasoli would best be treated with  elective coronary artery bypass grafting.   PLAN:  I  have outlined the options at length with Mr. Sudduth.  Alternative  treatments strategies have been reviewed.  He understands and accepts all  associated risks of surgery including, but not limited to, risks of death,  stroke, myocardial infarction, congestive heart failure, respiratory  failure, pneumonia, bleeding requiring blood transfusion, arrhythmia,  infection, and recurrent coronary artery disease.  All of his questions have  been addressed.                                               Salvatore Decent. Cornelius Moras, M.D.    CHO/MEDQ  D:  12/04/2003  T:  12/05/2003  Job:  [id number]   cc:   Armanda Magic, M.D.  [address]  [place], [place] [address]  Fax: [phone]   C. Duane Lope, M.D.  [address]  [place]  [place] [address]  Fax: [phone]

## 2010-11-14 NOTE — Cardiovascular Report (Signed)
NAME:  Darryl [name], Darryl [name] NO.:  [id number]   MEDICAL RECORD NO.:  [id number]          PATIENT TYPE:  INP   LOCATION:  6527                         FACILITY:  [facility]   PHYSICIAN:  Armanda Magic, M.D.     DATE OF BIRTH:  12/22/1934   DATE OF PROCEDURE:  05/10/2004  DATE OF DISCHARGE:  05/15/2004                              CARDIAC CATHETERIZATION   REFERRING PHYSICIAN:  C. Duane Lope, M.D.   PROCEDURES:  1.  Left heart catheterization.  2.  Coronary angiography.  3.  Left ventriculography.   OPERATOR:  Armanda Magic, M.D.   INDICATIONS:  Chest pain, abnormal Cardiolite.   COMPLICATIONS:  None.   INTRAVENOUS ACCESS:  The right femoral artery, 6-French sheath.   This is a very pleasant 76 year old white male with a previous history of  coronary disease.  He is status post coronary bypass grafting, in June 2005,  and now presents with recurrent chest pain.  A stress Cardiolite study  showed a mild reversible inferior wall defect.   The patient is brought to the cardiac catheterization laboratory in a  fasting nonsedated state.  Informed consent was obtained.  The patient was  connected to the continuous heart rate and pulse oximetry monitoring,  intermittent blood pressure monitoring.  The right groin was prepped and  draped in a sterile fashion.  Lidocaine 1% was used for local anesthesia.  Using the modified Seldinger technique, a 6-French sheath was placed in the  right femoral artery.  Under fluoroscopic guidance, a 6-French JL4 catheter  was placed in the left coronary artery.  Multiple cine films were taken at  30-degree RAO and 40-degree LAO views.  His catheter was then exchanged out  of the guidewire for a 6-French JR-4 catheter which was placed under  fluoroscopic guidance to the right coronary artery.  Multiple cine films  were taken at 30 degree RAO, 40 degree LAO views.  The catheter was then  pulled back and advanced into the saphenous vein graft of  the diagonal  graft.  Multiple cine films were taken at 30-degree RAO and 40-degree LAO  views.  This catheter was then pulled back and advanced into the saphenous  vein graft at the PDA.  Multiple cine films were taken at the 30-degree RAO  and 40-degree LAO views.  The catheter was then pulled back and advanced  into the radial artery graft of the OM-2 and multiple cine films were taken  at the 30-degree RAO and 40-degree LAO views.  The catheter was then  advanced into the left subclavian artery and then into the left internal  mammary artery.  Multiple cine films were taken in the 30-degree RAO and 40-  degree LAO views.  The catheter was then removed over a guidewire and a 6-  French angled pigtail catheter was placed under fluoroscopic guidance in the  left ventricular cavity.  Left ventriculography was performed in the 30  degree RAO view and a total of 30 mL of contrast at 15 mL/sec.  The catheter  was then pulled back across the aortic valve with no significant gradient  noted.  At the end of the procedure, all catheter sheaths were removed.  Manual compression was performed until adequate hemostasis was obtained.  The patient was transferred back to room in stable condition.   ASSESSMENT:  1.  Severe native three-vessel coronary disease.  2.  Saphenous vein graft to the diagonal widely patent with luminal      irregularities.  3.  Saphenous vein graft to the posterior descending artery with a 50-60%      proximal stenosis in the graft.  4.  Radial artery to the obtuse marginal 2, 70% ostial  stenosis at the      insertion of the aorta and the graft.  5.  Left internal mammary artery to the left anterior descending artery      widely patent.  6.  Normal left ventricular systolic function.  7.  Aortic pressure was 162/54-mmHg.  8.  Left ventricular pressure 162/4-mmHg.   ASSESSMENT:  1.  Severe three-vessel native coronary disease.  2.  Borderline stenosis of the saphenous  vein graft to the posterior      descending artery with obstructive lesions at the ostium of the radial      artery to the obtuse marginal 2.  3.  Normal left ventricular function.   PLAN:  1.  PCI of the radial artery to the OM-2 graft on May 13, 2005.  Due to      increased use of IV contrast today, would recommend proceeding in 24      hours, after IV fluid hydration.  2.  We will admit to a telemetry bed.  3.  Dr. Katrinka Blazing will perform PCI of the radial artery graft to the OM and      possible saphenous vein graft to the RCA.  He will determine at the time      of that cath whether that lesion is significant.      TT/MEDQ  D:  07/12/2004  T:  07/12/2004  Job:  [id number]   cc:   C. Duane Lope, M.D.  [address]  [place]  [place] [address]  Fax: [phone]

## 2010-11-14 NOTE — Discharge Summary (Signed)
NAME:  Darryl [name], Darryl [name] NO.:  [id number]   MEDICAL RECORD NO.:  [id number]          PATIENT TYPE:  INP   LOCATION:  6527                         FACILITY:  [facility]   PHYSICIAN:  Armanda Magic, M.D.     DATE OF BIRTH:  03-10-1935   DATE OF ADMISSION:  05/10/2004  DATE OF DISCHARGE:  05/15/2004                                 DISCHARGE SUMMARY   ADMITTING PHYSICIAN:  Dr. Amil Amen   CARDIOLOGIST:  Dr. Armanda Magic   CONSULTANTS:  Dr. Randa Evens with gastroenterology.   CHIEF COMPLAINT AND REASON FOR ADMISSION:  Darryl [name] is a 76 year old male  patient status post coronary artery bypass grafting in June of 2005  presented to the ER with chest tightness spontaneously.  Had had a similar  episode the day before and because he had an episode on the day of admission  he came to the ER.  He was given sublingual nitroglycerin x3 tablets with  very slow relief of chest discomfort.  Upon further questioning, the patient  revealed he had been having these spells of chest pain for at least 2-3  weeks, no associated symptoms, no radiation of the pain.   In addition to having the above-stated coronary history, he also has a  history of peptic ulcer induced by aspirin in July of 2005 and his  medications prior to admission did not include aspirin therapy because of  this.  On physical exam his initial blood pressure was somewhat elevated at  170/87 down to 140/90 after sublingual nitroglycerin but then back up again  to 190/107, heart rate 55, pulse rate 99 and he was saturating 99% on room  air.  Physical exam was otherwise unremarkable.  His EKG showed no ischemic  changes but a left bundle branch block, no significant abnormalities of his  CBC or BMET and point of care markers were negative x2 upon initial  evaluation.  Dr. Amil Amen admitted this patient to the medical telemetry unit  with the following diagnoses:  (1) Known coronary artery disease status post  coronary artery  bypass graft x4 in June of 2005 now with unstable angina  pectoris; (2) intolerance to aspirin secondary to peptic ulcer disease,  questionable contribute to possible early graft closures; (3) hypertension  currently uncontrolled.   HOSPITAL COURSE:  Problem 1.  UNSTABLE ANGINA IN A PATIENT WITH KNOWN  CORONARY ARTERY DISEASE:  The patient was admitted to the medical telemetry  unit.  He was started on transdermal nitroglycerin every 6 hours.  He was  given a dose of hydralazine initially to get his blood pressure down.  He  was started on Lovenox q.12h.  He was given one dose of aspirin and then  started on Plavix as per usual acute coronary syndrome rule out MI protocol.  Cardiac isoenzymes were also checked serially.  These were negative.  He was  still having mild episodes of chest discomfort throughout the weekend.  He  had a Cardiolite study done which showed a mild reversible inferior defect  and recommendations were to proceed with coronary angiography.  On May 12, 2004, Dr.  Turner performed a left heart catheterization on Darryl [name]  which showed severe three-vessel native disease as was known prior to CABG,  borderline stenosis of the saphenous vein graft to the PDA, as well as an  obstructive lesion at the ostium of the radial artery graft to the OM-2.  Because of this, interventional cardiology consult was obtained by Dr. Verdis Prime.  The angiograms were reviewed and again there were issues concerning  safety of antiplatelet treatment and duration given his issue of peptic  ulcer disease.  Therefore, Dr. Katrinka Blazing requested that GI evaluate the patient  to determine if it would be safe to proceed with anticoagulative therapy.  If not there were some concerns about whether PCI and stent placement should  even be considered.   On May 12, 2004, Dr. Randa Evens with gastroenterology did evaluate this  patient and on May 13, 2004 the patient was taken to the endoscopy  lab  where gastric erosion and some mild gastritis was found without significant  ulcers and Dr. Randa Evens deemed the patient appropriate to proceed with low-  dose aspirin, Plavix therapy and to proceed with stent placement as well, as  indicated, and on May 14, 2004 the patient was taken to the cath lab by  Dr. Katrinka Blazing at which point the patient had 2 TAXUS stents placed, one stent to  the radial artery graft of the obtuse marginal and another one to the SVG of  the RCA.  The patient tolerated the procedure well without chest pain or EKG  changes.  He received Angiomax in the cath lab and was started back on low-  dose aspirin and Plavix with recommendations from Dr. Katrinka Blazing to continue  Plavix for a minimum of 6 months preferably for 1 year as long as no  gastrointestinal problems such as GI bleeding or ulcer formation occurred.   Problem 2.  HYPERTENSION:  During the hospitalization, the patient's blood  pressure remained relatively well controlled except as upon initial  admission while he was having chest pain.  Again, he had some issues post  cath where he was unable to void and had a headache.  Both of the  intermittent increases in blood pressure responded well to p.r.n. dosing of  hydralazine.  On date of discharge, the patient's blood pressure was very  well controlled at 115/58.  His heart rate was slightly low at 52, thereby  limiting any possibility of increasing beta-blocker dosages if blood  pressure becomes uncontrolled.  Dr. Mayford Knife has recommended that if his blood  pressure becomes significantly elevated in the future that we can increase  his Hytrin.  We will continue same doses of clonidine, Maxzide, and Avapro  since these doses are at max level.   Problem 3.  HYPOKALEMIA:  The patient has demonstrated intermittent  hypokalemia with potassium's around 3.3 to 3.4 since admission.  He is on  diuretic therapy for hypertension and therefore low-dose potassium  repletion daily has been ordered at discharge.  The patient will have a BMET checked  in 1 week.   Problem 4.  GASTRIC EROSIONS:  Please see dictation under chest pain.  Again, Dr. Randa Evens did perform endoscopy on this patient which revealed  gastric erosions without ulcers.  He has been started on Protonix 40 mg  b.i.d.  The usual standard of care is to at least treat for 4 to 8 weeks and  follow up with endoscopy afterwards to determine if additional PPI therapy  is indicated.  I  suspect with the patient's need for continued aspirin and  Plavix he will at least be on a minimum dosage of Protonix 40 mg daily if  not b.i.d. for the next 12 months.  H. pylori biopsies were also taken in  the endoscopy lab and these were negative for any evidence of H. pylori  overgrowth.   Problem 5.  MILD THROMBOCYTOPENIA:  The patient presented with a normal  platelet count of 153,000.  He briefly began to trend down with a platelet  count as low as 136,000 on November 16th.  On the date of discharge, his  platelet count is 140,000.  No evidence of petechial rashes or any bleeding.  We will follow up on platelet count p.r.n. while the patient is on Plavix.   FINAL DISCHARGE DIAGNOSES:  1.  Unstable angina pectoris in a patient with known coronary artery bypass      grafting in June of 2005.  2.  Status post percutaneous coronary intervention and TAXUS stent placement      x2 to the radial graft to the second obtuse marginal as well as the      saphenous vein graft of the right coronary artery.  3.  Hypertension currently, controlled.  4.  Mild hypokalemia secondary to Maxzide.  5.  Mild thrombocytopenia, improving.  6.  Gastric erosions, resumed on b.i.d. proton-pump inhibitor by Dr.      Randa Evens.   DISCHARGE MEDICATIONS:  1.  Hytrin 1 mg at bedtime.  2.  Plavix 75 mg daily.  3.  Colace 100 mg daily, hold if loose stools, can probably change to p.r.n.  4.  Catapres 0.3 mg b.i.d.  5.  Maxzide  37.5/25 one daily.  6.  Avapro 300 mg daily.  7.  Lopressor 25 mg twice daily.  8.  Protonix 40 mg twice daily.  9.  Aspirin 81 mg daily.  10. K-Dur 20 mEq daily.   ACTIVITY:  No lifting greater than 10 pounds for the next 2 days.  Pending  discussion with Dr. Mayford Knife, the patient is awaiting decision if he can go  ahead and travel over the Thanksgiving holiday weekend.   DIET:  Heart healthy.   WOUND CARE:  Call our office if there is any increased swelling or bruising  at the catheterization site.   FOLLOW UP APPOINTMENTS:  You are to call Dr. Randa Evens office regarding follow  up appointments within the next 4-6 weeks.  With Dr. Mayford Knife, you have an  appointment to see her on Monday, November 28th, at 3 p.m.  Before that, you  are to report to the [facility] lab to have a BMET checked at 2:30 p.m.   ADDITIONAL RECOMMENDATIONS:  I do not have all the records from the office  but unclear if this patient has ever been on statin therapy in the past. Will defer follow up on this to Dr. Mayford Knife at the office.  If has not had a  fasting lipid panel checked recently, would recommend doing that at a time  to be determined later by Dr. Mayford Knife.      Alli   ALE/MEDQ  D:  05/15/2004  T:  05/15/2004  Job:  [id number]   cc:   Lyn Records III, M.D.  [address]  [place]  [place] [address]  Fax: [phone]   Llana Aliment. Malon Kindle., M.D.  [address]  [place]  [place] [address]  Fax: [phone]

## 2010-11-14 NOTE — Op Note (Signed)
NAME:  Darryl [name], Darryl [name] NO.:  [id number]   MEDICAL RECORD NO.:  [id number]                   PATIENT TYPE:  INP   LOCATION:  4732                                 FACILITY:  [facility]   PHYSICIAN:  Petra Kuba, M.D.                 DATE OF BIRTH:  [birth year]   DATE OF PROCEDURE:  01/22/2004  DATE OF DISCHARGE:                                 OPERATIVE REPORT   PROCEDURE:  Esophagogastroduodenoscopy with biopsy.   INDICATIONS FOR PROCEDURE:  Upper GI bleeding.  Consent was signed after  risks, benefits, methods, and options were thoroughly discussed.   PROCEDURE:  The video endoscope was inserted by direct vision.  The  esophagus was normal.  He did have a small hiatal hernia.  The scope was  passed into the stomach, no blood was seen.  It was advanced to the antrum  where two small distal antral ulcers were seen with no visible vessel.  These ulcers were washed and watched multiple times and could not be made to  bleed.  The water that was injected was then suctioned from the proximal  stomach.  The scope passed through a normal pylorus into a normal duodenal  bulb and around the C-loop to a normal second portion of the duodenum.  No  blood was seen distally.  The scope was then withdrawn back to the bulb.  I  could look there and rule out ulcers in that location.  The scope was  withdrawn back to the stomach and retroflexed.  Angularis, cardia, fundus,  lesser, and greater curve were all normal on retroflex visualization.  Straight visualization of the stomach did not reveal any additional  findings.  Again, we rewashed and watched the ulcers but could not make them  bleed.  Water was suctioned, the scope was slowly withdrawn again, a good  look at the esophagus was normal.  Prior to withdrawing the scope, we did  take one biopsy of the antrum for the CLOtest to rule out Helicobacter but  he was CLOtest negative a few years ago during his last endoscopy.   The  scope was removed.  The patient tolerated the procedure well.  There was no  obvious immediate complications.   ENDOSCOPIC DIAGNOSIS:  1. Small hiatal hernia.  2. Two antral ulcers with flat, white bases, minimal erythema around the     edges.  3. Otherwise, normal EGD without any signs of bleeding status post CLOtest     biopsy of the antrum.   PLAN:  Hold aspirin and begin pump inhibitors, await CLOtest biopsy, clear  liquids OK and if no signs of further bleeding, can advance tomorrow.  Repeat EGD p.r.n.  Will have to discuss with cardiology and CVTS how long it  is  OK to hold his aspirin for and then as long as on aspirin in the future,  will need pump inhibitors to prevent recurrence.  Possibly, if we can hold  the aspirin for a month or two, we can document healing with repeat  endoscopy and then restart it with pump inhibitor protection.                                               Petra Kuba, M.D.    MEM/MEDQ  D:  01/22/2004  T:  01/22/2004  Job:  [id number]   cc:   Armanda Magic, M.D.  [address]  [place], [place] [address]  Fax: [phone]   Salvatore Decent. Cornelius Moras, M.D.  [address]  [place]  [place] [address]

## 2010-11-14 NOTE — Discharge Summary (Signed)
NAME:  Darryl [name], Darryl [name] NO.:  [id number]   MEDICAL RECORD NO.:  [id number]                   PATIENT TYPE:  INP   LOCATION:  4732                                 FACILITY:  [facility]   PHYSICIAN:  Isla Pence, M.D.             DATE OF BIRTH:  Oct 20, 1934   DATE OF ADMISSION:  01/22/2004  DATE OF DISCHARGE:  01/24/2004                                 DISCHARGE SUMMARY   DISCHARGE DIAGNOSES:  1. Upper gastrointestinal bleed with two antral ulcers noted on     esophagogastroduodenoscopy that were not actively bleeding.  Note that     this current problem is also noted to be secondary to recent aspirin use.     Dr. Regino Schultze had recommended holding off the aspirin for a month or two if     possible, and if it needs to be restarted they can repeat his endoscopy     to document healing with him needing to be on proton pump inhibitors,     such as Protonix.  2. Previous history of bleeding ulcers about 3 or 4 years ago related to     Celebrex use.  3. History of hypertension with periods of uncontrolled hypertension that is     in the midst of being worked up by Dr. Reather Littler.  The patient has had a     MRI recently.  Please refer to the initial H&P dictated by Dr. Ladona Ridgel for     details.  4. History of basal ganglia stroke.  5. History of headaches for which he takes Darvocet.   PAST SURGICAL HISTORY:  1. Status post coronary artery bypass surgery on December 05, 2003.  2. History of gallbladder surgery, removed about five years ago.  3. Status post appendectomy at age 76.  4. Status post removal of facial melanoma.   DISCHARGE MEDICATIONS:  The patient is to resume all of his home medications  consisting of:  1. Centrum p.o. daily.  2. Fish oil 1000 mg p.o. daily.  3. Darvocet-N 100 one to two tablets p.o. q.4-6h. p.r.n. for headache.  4. Vitamin E that he had just started on daily.  5. Restoril p.r.n.  6. Avapro 300 mg p.o. daily.  7. Clonidine 0.3 mg  p.o. b.i.d.  8. Metoprolol 25 mg p.o. b.i.d.  9. Sular 20 mg p.o. daily at lunchtime.  10.      Protonix 40 mg p.o. b.i.d. x2 weeks, and then after that once a     day, and he will need to stay on this.  If the patient is going to be     placed back on aspirin he will need to be on Protonix for the entire     duration of aspirin or any anti-inflammatory medications.  11.      The patient has been advised to hold off on aspirin.  Further     discussion as for the  need for aspirin will be made with him and Dr.     Mayford Knife.  I have recommended to the patient in light of his coronary     artery disease that he would benefit from being on low dose aspirin after     the initial month or so.  Gastroenterology, Dr. Regino Schultze, who had     initially seen him and done his scope had also recommended being off of     aspirin for a month or two if that is possible.   ACTIVITY:  As tolerated.   DIET:  Low fat, low salt.   FOLLOWUP:  1. The patient is to keep his appointment with Dr. Armanda Magic with     cardiology this Monday, which is January 28, 2004.  2. The patient is to have a followup with his primary care physician, Dr.     Duane Lope, in one week.  3. The patient is to follow up with Dr. Reather Littler in about 3 to 4 weeks.   CONSULTATIONS:  [facility].  Dr. Regino Schultze had seen him and had done his EGD.  Endoscopy findings showed a small hiatal hernia, and two antral ulcers with  flat white spaces with minimal erythema around the edges.  Otherwise, it was  normal EGD without any signs of bleeding.  The patient is status post CLO  test biopsy of the antrum.  The CLO test biopsy is still pending at the time  of discharge, and this needs to be followed up by Dr. Tenny Craw, his primary care  physician.   HOSPITAL COURSE:  This 76 year old gentleman was admitted to the [facility] Service status post coffee-ground emesis and also melena.  Please refer to the initial H&P dictated by Dr. Derenda Mis  for details  of that presentation.  The patient was admitted to the [facility], was placed on telemetry, and had serial H&H done.  His initial  hemoglobin was 11.6 and 33.1.  He was also started on IV fluids, although  his blood pressure was holding up sufficiently with his initial blood  pressure of 146/89.  Gastroenterology was consulted on that first day  itself, and Dr. Regino Schultze who had seen him that same day took him to the  endoscopy suite for his upper GI endoscopy with findings as previously  listed.  His hemoglobin did trend downward to the lowest being 9.1, and in  light of this downward trend and in light of the coronary artery disease, he  was transfused 1 unit of blood.  Dr. Luther Parody from [facility] had seen him in  followup on January 23, 2004, and had recommended because the H&H was stable he  could be discharged.  The patient was not really symptomatic when his  hemoglobin had dropped to 9.1 last night at about 9 p.m.  The patient's  stools have lightened up in color and he has only had one very small bowel  movement today, and he reports that it is very light in color and it is  certainly not black and tarry as it was.  He has not had any further emesis  during his stay here.  Once again as mentioned, he did receive 1 unit of  packed red blood cells with his hemoglobin response being more than  sufficient.  His repeat H&H this morning post the 1 unit is 11.9 and 33.7.  This is higher than the expected result with 1 unit, therefore, suggesting  stabilization of  his bleed.  Once again, the patient is asymptomatic, denies  any chest pain or shortness of breath.  His blood pressure is holding up at  147/88, and he is stable for discharge.  Also of significance, his PT and  INR at the time of admission were normal at 13.7 and 1.1.  His PTT was  normal at 25.  The rest of his electrolytes on admission were fairly unremarkable.   In regards to his hypertension, this  has not been an issue during his  hospital stay here.  We have kept him on all of his usual blood pressure  medications.  It was held one day post-admission based on the parameters  that Dr. Ladona Ridgel had given if his blood pressure was low, but he has  subsequently been restarted back on that and is tolerated this very well.  Dr. Ladona Ridgel had spoken to Dr. Lucianne Muss when the patient was first admitted, and  he recommended just follow up as an outpatient.   Once again in regards to his need for aspirin use, this will have to be  discussed with Dr. Mayford Knife and the patient in light of his coronary artery  disease.  It it is the recommendation of GI to if possible hold the aspirin  for a month or two until the healing of his ulcers and if needed they are  willing to do a repeat endoscopy to document healing of the ulcers prior to  restarting back on his aspirin.  If he does get started back on aspirin, the  recommendation is that the patient will need to stay on Protonix for the  duration of his aspirin or any other anti-inflammatory use.  The patient has  been told this information of the fact that if he ever needs anti-  inflammatories, he will need to be on a Protonix-like medication.                                                Isla Pence, M.D.    RRV/MEDQ  D:  01/24/2004  T:  01/24/2004  Job:  [id number]   cc:   Armanda Magic, M.D.  [address]  [place], [place] [address]  Fax: [phone]   C. Duane Lope, M.D.  [address]  [place]  [place] [address]  Fax: [phone]   Reather Littler, M.D.  [address]  [place]  [place] [address]  Fax: [phone]   Petra Kuba, M.D.  [address]  [place]  [place] [address]  Fax: [phone]   Althea Grimmer. Luther Parody, M.D.  [address], [address]  [place]  [place] [address]  Fax: [phone]

## 2010-11-14 NOTE — Op Note (Signed)
NAME:  Darryl [name], HY NO.:  [id number]   MEDICAL RECORD NO.:  [id number]          PATIENT TYPE:  INP   LOCATION:  3730                         FACILITY:  [facility]   PHYSICIAN:  James L. Malon Kindle., M.D.DATE OF BIRTH:  1935-02-12   DATE OF PROCEDURE:  05/13/2004  DATE OF DISCHARGE:                                 OPERATIVE REPORT   PROCEDURE:  Esophagogastroduodenoscopy with biopsy.   MEDICATIONS GIVEN:  Fentanyl 50 mcg, Versed 5 mg IV, Cetacaine spray.   INDICATIONS FOR PROCEDURE:  The patient has coronary artery disease and will  likely need a stent placed.  He has had a history of ulcer disease in the  past and endoscopy is performed to rule out ulcers prior to initiating  anticoagulation.   DESCRIPTION OF PROCEDURE:  The procedure was explained to the patient and  consent obtained.  With the patient in the left lateral decubitus position,  the Olympus endoscope was inserted and advanced into the esophagus.  We were  able to advance down into the antrum and then advance over to the pyloric  channel and into the duodenum.  The duodenum was completely free of  ulceration and inflammation.  The scope was withdrawn back into the antrum.  Several small erosions were seen.  There were no large ulcers, no active  bleeding.  Biopsies were taken for Helicobacter.  The fundus and cardia were  seen well on the retroflex view and were normal.  The scope was withdrawn.  The patient tolerated the procedure well.   ASSESSMENT:  Gastritis and erosions, 535.00.   PLAN:  Will use b.i.d. PPIs for several weeks and go ahead with the stent  and angioplasty.       JLE/MEDQ  D:  05/13/2004  T:  05/13/2004  Job:  [id number]   cc:   Armanda Magic, M.D.  [address]  [place], [place] [address]  Fax: [phone]   Althea Grimmer. Luther Parody, M.D.  [address]  [place]  [place] [address]  Fax: [phone]

## 2010-11-14 NOTE — Discharge Summary (Signed)
NAME:  Darryl [name], Darryl [name] NO.:  [id number]   MEDICAL RECORD NO.:  [id number]                   PATIENT TYPE:  INP   LOCATION:  0364                                 FACILITY:  [facility]   PHYSICIAN:  Darryl [name], M.D.             DATE OF BIRTH:  12-30-1934   DATE OF ADMISSION:  06/02/2002  DATE OF DISCHARGE:  06/07/2002                                 DISCHARGE SUMMARY   PRIMARY CARE PHYSICIAN:  Dr. Tenny Craw of [facility].   DISCHARGE DIAGNOSES:  1. Hypertensive crisis:  resolved.  2. Uncontrolled hypertension:  Early followup.  3. Pontine intracranial hemorrhage:  Stable.  4. Obesity.   DISCHARGE MEDICATIONS:  1. Avapro 300 mg daily.  2. Catapres patch 0.2 mg in 24 hours, change every 7 days.  3. Norvasc 10 mg daily.  4. Darvocet-N 100 one to two tabs q.4-6 hours as needed for pain.   ALLERGIES:  NKDA.   PROCEDURES:  None.   HISTORY OF PRESENT ILLNESS:  A 76 year old man with a history of  hypertension which has always been difficult to control presents to the  emergency room with a 5-6 day history of worsening headache.  He saw his eye  doctor on day of presentation and at that time his systolic blood pressure  was noted to be in the 250 range.  The patient was sent to the emergency  department by the above-named doctor.  At the emergency department, initial  blood pressure was 230/138.  The patient received a total of 0.4 mg of  clonidine and Avapro 300 mg.  He denies chest pain, palpitations, shortness  of breath, extremity swelling, extremity weakness, or loss of consciousness.  His urine output has been baseline.  No new visual disturbances.  He is  admitted initially for treatment of hypertension for urgency.   HOSPITAL COURSE:  Vital signs as were noted above.  Two-view chest showed  low-volume film with pulmonary vascular congestion.  A CT of the head  demonstrated tiny pontine hemorrhage at the corticomedullary junction of  the  inferior parietal lobe.  Hemorrhage is not identified elsewhere.  Moderate  to severe changes of small vessel disease of the white matter.  Due to the  findings of his head CT, the patient's diagnosis was then changed to  hypertensive crisis.  He was admitted to telemetry bed, continued on Avapro  and clonidine as well as HCTZ.  Provided with supplemental oxygen.   The patient did not demonstrate any acute EKG changes in ST wave and/or  ectopy.  Maintained sinus rhythm throughout his admission.  Cardiac enzymes  on this visit were negative.  The patient's BNP was 75.7.   The patient's blood pressure was gradually brought to a more reasonable  level but is still poorly controlled at time of discharge.  Blood pressure  on day of discharge is 156/85.  The patient will need close followup with  his primary care [name] regarding his hypertension.  He has no  neurological deficit at time of discharge, and the pontine hemorrhage noted  on his CT is stable.  At time of discharge, the patient is free of headache,  chest pain, shortness of breath, dizziness, or palpitations.   DISCHARGE LABORATORY DATA:  WBC 7.8, hemoglobin 15.7, hematocrit 44.9,  platelet count 147,000.  Sodium 135, potassium 4.3, chloride 107, CO2 28,  glucose 110, BUN 17, creatinine 1.3, calcium 8.9.  UA on this visit was  negative.   CONSULTATIONS:  None.   CONDITION ON DISCHARGE:  Substantially improved.  Stable.   DISPOSITION:  Discharged to home with early followup.   FOLLOW UP:  The patient to see Dr. Tenny Craw on June 12, 2002 at 10:30 a.m.     Ellender Hose. Christian Mate, M.D.    SMD/MEDQ  D:  06/08/2002  T:  06/08/2002  Job:  [id number]   cc:   Dr. Tenny Craw at [facility]

## 2010-11-14 NOTE — Cardiovascular Report (Signed)
NAME:  Darryl [name], Darryl [name] NO.:  [id number]   MEDICAL RECORD NO.:  [id number]                   PATIENT TYPE:  INP   LOCATION:  2908                                 FACILITY:  [facility]   PHYSICIAN:  Armanda Magic, M.D.                  DATE OF BIRTH:  03-May-1935   DATE OF PROCEDURE:  12/03/2003  DATE OF DISCHARGE:                              CARDIAC CATHETERIZATION   PROCEDURE:  1. Left heart catheterization.  2. Coronary angiography.  3. Left ventriculography.   OPERATOR:  Armanda Magic, M.D.   INDICATIONS:  Chest pain, non-Q-wave myocardial infarction.   COMPLICATIONS:  None.   IV ACCESS:  Via right femoral artery with 6 French sheath.   This is a 76 year old white male with a previous history of poorly  controlled hypertension who presented with an episode of chest pain and  ruled in for a non-Q-wave myocardial infarction.  He now presents for  cardiac catheterization.   The patient is brought to the cardiac catheterization laboratory in a  fasting nonsedated state.  Informed consent was obtained.  The patient was  connected to continuous heart rate and pulse oximetry monitoring,  intermittent blood pressure monitoring.  The right groin was prepped and  draped in a sterile fashion.  1% Xylocaine was used for local anesthesia.  Using modified Seldinger technique, a 6 French sheath was placed in the  right femoral artery.  Under fluoroscopic guidance, a 6 [place] JL-4 catheter  was placed in the coronary ostium.  Adequate engagement was unable to be  performed and therefore the catheter was exchanged out for a 6 [place] JL-5  which successfully engaged in the coronary ostium.  Multiple cine films were  taken at 30-degree RAO, 40-degree LAO views.  This catheter was then  exchanged out over guide wire for 6 [place] JR-4 catheter which was placed  under fluoroscopic guidance in the right coronary artery.  Multiple cine  films were taken at 30-degree RAO,  40-degree LAO views.  The catheter was  then exchanged over guide wire for 6 French angled pigtail catheter which  was placed under fluoroscopic guidance in the left ventricular cavity.  Left  ventriculography was performed in 30-degree RAO view using total 30 ml  contrast at 15 ml per second.  At the end of the procedure, all catheters  and sheaths were removed.  Manual compression was performed until adequate  hemostasis was obtained.  The patient was transferred back to the room in  stable condition.   RESULTS:  Left main coronary artery is widely patent and bifurcates into a  left anterior descending artery and left circumflex artery.   The left anterior descending artery has a 90% proximal narrowing just after  the takeoff of the first diagonal branch.  The first diagonal has a 40%  proximal narrowing and it bifurcates into two daughter branches, both of  which are widely patent.  The mid and distal LAD are diffusely diseased up  to 50%.  There is a second diagonal which is patent.  The left circumflex  gives rise to a small obtuse marginal one which is patent.  The second  obtuse marginal branch is a very large branch with a 60-70% proximal  narrowing.  The ongoing circumflex has luminal irregularities up to 20%.  The right coronary artery has a very large aneurysm at its ostium and it was  difficult to determine if there was an ostial lesion, but appeared to be  proximal to the aneurysm.  Obscured most of the ostium from visualization.  The mid and distal RCA was diffusely diseased up to 50-60%.  It then  bifurcated into a posterior descending artery and posterior lateral artery.  The posterior descending artery was very small with an 80-90% narrowing.   Left ventricular function showed EF 55% with inferior apical akinesis.  Left  ventricular pressure 196 mmHg.  Aortic pressure 203/118 mmHg.  LVEDP 11  mmHg.   ASSESSMENT:  1. Severe two-vessel obstructive coronary disease.  2.  Normal ejection fraction with inferior apical akinesis.  3. Poorly controlled hypertension.   LABORATORY DATA:  CVTS consult and aggressive blood pressure management.                                               Armanda Magic, M.D.    TT/MEDQ  D:  12/03/2003  T:  12/03/2003  Job:  [id number]

## 2010-11-14 NOTE — Discharge Summary (Signed)
NAME:  JAIRON, RIPBERGER NO.:  [id number]   MEDICAL RECORD NO.:  [id number]                   PATIENT TYPE:  INP   LOCATION:  2024                                 FACILITY:  [facility]   PHYSICIAN:  Salvatore Decent. Cornelius Moras, M.D.              DATE OF BIRTH:  1934/08/12   DATE OF ADMISSION:  11/30/2003  DATE OF DISCHARGE:                                 DISCHARGE SUMMARY   CHIEF COMPLAINT:  Chest pain.   PAST MEDICAL HISTORY:  1. Hypertension, difficult to control.  2. Remote history of peptic ulcer disease.  3. Hyperlipidemia.  4. History of reported lacunar cerebral infarction related to hypertension     several years ago with no residual neurological effects.   PAST SURGICAL HISTORY:  1. Appendectomy.  2. Cholecystectomy.   ALLERGIES:  No known drug allergies.   DISCHARGE DIAGNOSES:  1. Severe three vessel coronary artery disease with class IV unstable     angina, status post coronary artery bypass grafting x 3.  2. Second degree arteriovenous block.  3. Left bundle branch block preoperative and postoperatively.   HISTORY OF PRESENT ILLNESS:  The patient is a 76 year old male from  [place] with no previous history of coronary artery disease.  Risk  factors include hypertension, hyperlipidemia, and a strong family history of  coronary artery disease.  On the Friday prior to admission, the patient had  been working in his yard gardening and doing fairly strenuous work when he  developed prolonged episode of tingling and sharp pain which radiated across  his chest to both arms.  The patient stopped and rested and the pain was  relieved; however, when he started to work again, the pain returned.  It was  associated with shortness of breath and prolonged pain.  The patient  subsequently presented to his primary care physician and was referred  directly to the emergency department for further management.   After arrival, the patient was noted to have mildly  elevated cardiac enzymes  with a new left bundle branch block found on 12-lead EKG.  He was admitted  to the hospital and was also noted to be severely hypertensive at the time.  The patient's blood pressure was brought under control and he was started on  heparin.  His chest pain resolved and his cardiac enzymes peaked with a CK-  MB fraction of 5.2 and a peak troponin I of 1.14.   The patient underwent elective cardiac catheterization on December 03, 2003 by  Dr. Armanda Magic.  This revealed severe three vessel coronary artery disease  with mild left ventricular dysfunction.   Dr. Salvatore Decent. Cornelius Moras of [facility] was subsequently consulted regarding surgical  revascularization.  It was Dr. Deliah Boston opinion that the patient  should undergo coronary artery bypass graft surgery.   HOSPITAL COURSE:  The patient presented to the emergency department on November 30, 2003 and was evaluated by  Dr. November 30, 2003 and was evaluated by Dr. Armanda Magic.  The patient complained of chest pain while working in his garden.  The patient was admitted to telemetry and cardiac enzymes were obtained.  EKG revealed a left bundle branch block.  The patient was subsequently set  up for cardiac catheterization. The patient was started on heparin and  maintained on routine hospital care.   The patient was found to have only elevated cardiac enzymes and was  diagnosed with a non-Q-wave myocardial infarction.  The patient's blood  pressure has remained elevated throughout the hospital stay.  It has been  somewhat difficult to control.   Secondary to the patient's elevated blood pressure and history of a small  intracranial bleed, a head CT was obtained.  This was found to be negative.   The patient was taken to the cardiac catheterization by Dr. Armanda Magic on  December 03, 2003.  This revealed severe three vessel coronary artery disease.  Dr. Salvatore Decent. Cornelius Moras of [facility] was subsequently called regarding surgical   revascularization.  Dr. Salvatore Decent. Cornelius Moras evaluated the patient on December 04, 2003 and it was his opinion that the patient should proceed with elective  coronary artery bypass graft surgery.   The patient was taken to the OR on December 05, 2003 for coronary artery bypass  grafting x 4.  The left internal mammary artery was grafted to the LAD,  saphenous vein was grafted to the diagonal, saphenous vein was grafted to  the PDA, and the left radial artery was grafted to the circumflex marginal.  The patient tolerated the procedure well.  He was hemodynamically  immediately postoperatively.  The patient was transferred from the OR to  SICU in stable condition.   The patient was extubated without complication.  He woke up from anesthesia  neurologically intact.  Postoperative day one, the patient was in stable  condition.  He was afebrile and maintained a normal sinus rhythm.  His blood  pressure was 130 to 170 systolic  and his cardiac index was greater than  4.2.  Physical exam was within normal limits.   Due to the patient's persistently elevated blood pressure, the patient was  restarted on his Clonidine.  The patient was also started on Imdur on  postoperative day one.  The patient maintained a secondary AV block  postoperatively.  This subsequently resolved approximately postoperative day  three.   Postoperative day four, the patient was in stable condition.  On telemetry,  he was converting between a sinus rhythm and a sinus tachycardia.  There  were no episodes of bradycardia or heart block evident.   The patient's CBGs remained elevated postoperatively.  On postoperative day  four, they were 141 and 166.  The patient's hemoglobin A1C was 5.0  preoperatively; therefore, the patient was told to monitor his diet.   On postoperative day five, the patient was feeling well and was wanting to go home.  The patient was voiding without difficulty and was maintained in  normal sinus rhythm at  the time.  The patient subsequently had 12 beats of  ventricular tachycardia on postoperative day five with a heart rate of  approximately 200.  The patient's blood pressure at the time was 166/112.  The patient was asymptomatic and resting comfortably in the chair.  A 12-  lead EKG was obtained and cardiac enzymes were ordered.   The patient's blood pressure was rechecked several hours later, after  multiple blood pressure  medications have been given, and it was 144/98.  The  patient has been in stable condition and maintained a normal sinus rhythm  since that time.  The patient is ambulating well and tolerates it without  difficulty.   If the patient is able to maintain a normal sinus rhythm for the next 24  hours, he will be felt to be ready for discharge within one to two days.   LABORATORY DATA:  BNP on December 08, 2003 shows a sodium of 139, potassium 3.7,  BUN 15, creatinine 1.2, glucose 119.  CBC on December 08, 2003 shows a  hemoglobin 9.7, hematocrit 28.3, white count 12.1, platelets 96,000.   CONDITION ON DISCHARGE:  Improved.   DISCHARGE MEDICATIONS:  1. Aspirin 81 mg p.o. q.d.  2. Toprol XL 50 mg p.o. q.d.  3. Clonidine 0.3 mg b.i.d.  4. Avapro 300 mg q.d.  5. Colace 100 mg 2 tablets q.d. p.r.n. constipation.  6. Tylox 1-2 p.o. q.4-6h. p.r.n. pain.   ACTIVITY:  No driving, no lifting more than 10 pounds, and the patient is to  continue daily breathing and walking exercises.   DIET:  Low salt and low fat with attention paid to carbohydrate modification  secondary to elevated blood sugars, status post CABG.   WOUND CARE:  The patient may shower daily and clean the incisions with soap  and water.  If the incisions become red, swollen, drain, or if the patient  has a fever of 101 degrees, he is to call the CVTS office at [phone].   FOLLOW UP:  The patient is to call Dr. Lindley Magnus office for an  appointment two weeks after discharge.  A chest x-ray will be taken at  the  appointment with Dr. Armanda Magic.  Follow-up with Dr. Salvatore Decent. Cornelius Moras on  December 08, 2003 at 1 p.m.  The patient is to bring the chest x-ray from Dr.  Armanda Magic to the appointment with Dr. Purcell Nails.      Pecola Leisure, PA                      Salvatore Decent. Cornelius Moras, M.D.    AY/MEDQ  D:  12/10/2003  T:  12/11/2003  Job:  [id number]   cc:   Salvatore Decent. Cornelius Moras, M.D.  [address]  [place]  [place] [address]   Armanda Magic, M.D.  [address], [address]  [place], [place] [address]  Fax: [phone]   Patient's chart

## 2010-11-14 NOTE — Consult Note (Signed)
NAME:  Darryl [name], CUTHRELL NO.:  [id number]   MEDICAL RECORD NO.:  [id number]                   PATIENT TYPE:  INP   LOCATION:  1825                                 FACILITY:  [facility]   PHYSICIAN:  Petra Kuba, M.D.                 DATE OF BIRTH:  January 01, 1935   DATE OF CONSULTATION:  01/22/2004  DATE OF DISCHARGE:                                   CONSULTATION   HISTORY:  The patient seen at the request of Derenda Mis, MD, for upper  GI bleeding.  He has had ulcers in the past, followed by Dr. Luther Parody, had  a CABG about a month ago and has been on an aspirin a day.  He has not had  any GI symptoms until he started throwing up some coffee-grounds and having  melena.  He is stable otherwise and has no other GI symptoms.  His BUN was  43.  His hemoglobin was 11.6, and I was asked to see the patient.  He has  had no previous bleeding problems.   PAST MEDICAL HISTORY:  1. Pertinent for the recent CABG.  2. Appendectomy.  3. Cholecystectomy.  4. Hypertension.  5. History of ulcers.  6. He also had a small intracranial hemorrhage in the past.   MEDICATIONS:  Lopressor, Avapro, Clonidine, and an aspirin a day.  No known  allergies.   SOCIAL HISTORY:  Does not drink and does not smoke.  Minimizes other over-  the-counter medicine use.   FAMILY HISTORY:  Negative for any GI problems.   REVIEW OF SYSTEMS:  Negative except above.   PHYSICAL EXAMINATION:  GENERAL:  No acute distress, lying comfortable in the  bed.  VITAL SIGNS:  Stable, afebrile.  LUNGS:  Clear.  HEART:  Regular rate and rhythm.  ABDOMEN:  Soft, nontender.  RECTAL:  Guaiac positive melena per Dr. Ladona Ridgel.   LABORATORY DATA:  Pertinent for white count of 10.9, hemoglobin 11.6, MCV  90, platelets 243, PT 13.7, BUN 43.  Sodium and potassium are normal.   ASSESSMENT:  Upper gastrointestinal bleed in a patient with history of  ulcers on an aspirin a day, status post recent CABG.   PLAN:   The risks, benefits, methods of endoscopy were discussed, and we will  proceed this afternoon with further work-up and plans pending those  findings.                                               Petra Kuba, M.D.    MEM/MEDQ  D:  01/22/2004  T:  01/22/2004  Job:  [id number]   cc:   Armanda Magic, M.D.  [address], [address]  [place], [place] [address]  Fax: [phone]   Melissa L. Ladona Ridgel, MD  [address], [place] [address]  Fax: [phone]   Salvatore Decent. Cornelius Moras, M.D.  [address]  [place]  [place] [address]

## 2010-11-14 NOTE — H&P (Signed)
NAME:  Darryl, [name] NO.:  [id number]   MEDICAL RECORD NO.:  [id number]          PATIENT TYPE:  EMS   LOCATION:  MAJO                         FACILITY:  [facility]   PHYSICIAN:  Francisca December, M.D.  DATE OF BIRTH:  12-May-1935   DATE OF ADMISSION:  05/10/2004  DATE OF DISCHARGE:                                HISTORY & PHYSICAL   REASON FOR ADMISSION AND CHIEF COMPLAINT:  Chest tightness.   HISTORY OF PRESENT ILLNESS:  Mr. Darryl [name] is a pleasant 76 year old  man who is now six months status post CABG.  This was a three-vessel bypass  grafting by Dr. Tressie Stalker.  He presented in early June 2005 with chest  pain and ruled in for non-ST-segment-elevation MI.  Subsequent cardiac  catheterization revealed severe two-vessel coronary artery disease with 90%  proximal LAD stenosis and a 60 to 70% stenosis in the marginal branch.  He  underwent bypass grafting of the left anterior descending artery and first  diagonal branch.  The latter was a vein graft.  He had a right radial free  graft placed to the second marginal branch.   Over the past two to three weeks, the patient has been having spontaneous  episodes of chest tightness.  This was in the upper mid chest, spreading  bilaterally.  It did not reach his shoulder or arms, does not radiation  through the back or into the neck, not associated with nausea, diaphoresis,  or significant dyspnea.  He has been engaging in his usual activity  including walking with arm exercises and not had symptoms with that.  However, he did develop a spontaneous episode beginning yesterday afternoon  that lasted through the evening and was present this morning.  Because of  that, he presented to the emergency room here at [facility], arriving at  0900.  He received sublingual nitroglycerin x 3 over approximately 1-1/2  hours with slow resolution of the discomfort.  At the time of my evaluation  at 1400, he has no discomfort.   He has not used nitroglycerin at home.  He  has not had any.  He has been having management of hypertension by Dr.  Mayford Knife.  She recently gave him a home blood pressure cuff because of chronic  elevation.   PAST MEDICAL HISTORY:  1.  Hypertension with history of poor control.  2.  Coronary artery bypass grafting x 3 as noted above June 2005.  3.  Peptic ulcer disease remotely as well as in July 2005 thought to be      induced or exacerbated by aspirin use.  No aspirin since.  4.  Remote history of basal ganglia stroke and excision of facial melanoma.   PAST SURGICAL HISTORY:  As above.  Remotely, he has had cholecystectomy and  appendectomy.   MEDICATIONS:  1.  Avapro 300 mg p.o. daily.  2.  Clonidine 0.3 mg p.o. daily.  3.  Maxzide generic 25/37.5 one p.o. daily.  4.  Metoprolol 25 mg p.o. b.i.d.  5.  Terazosin 1 mg p.o. q.h.s.  6.  Fish oil 1000 mg  p.o. daily.   DRUG ALLERGIES:  None, but he is sensitive to ASPIRIN as above.  SULAR and  NORVASC are known to cause lower extremity edema.   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  Does not use alcohol or tobacco.  Accompanied by his wife here in the  emergency room today.   FAMILY HISTORY:  Noncontributory.   REVIEW OF SYSTEMS:  Denies any cough or hemoptysis.  No fever or chills, no  visual changes or headaches.  No abdominal pain, diarrhea, constipation,  hematochezia or melena.  No dysuria, hematuria, or nocturia.  No muscle  aches or weakness.  No joint pain or swelling.  He has no neurologic  sequelae from remote CVA.   PHYSICAL EXAMINATION:  VITAL SIGNS:  Blood pressure on arrival in the  emergency room was 170/87, falling to 140/90 after sublingual nitroglycerin.  At the time of my evaluation, it was 190/107.  Pulse 55 and regular,  respiratory rate 16, temperature 99.0, O2 saturation on room air 99%.  GENERAL:  Well-appearing, 76 year old man, pleasant and in no distress.  HEENT:   Unremarkable.  Head is atraumatic and normocephalic.  Pupils equal,  round, and reactive to light and accommodation.  Extraocular movements are  intact.  Sclerae anicteric.  Oral mucosa pink and moist.  Teeth and gums in  good repair.  NECK:  Supple without thyromegaly or masses.  Carotid upstrokes are normal.  There is no bruit.  There is no jugular venous distention.  CHEST:  Clear with adequate excursion.  Normal vesicular breath sounds are  heard throughout.  The precordium is quiet. Normal S1 and S2 is heard.  No  S3, S4, murmur, click, or rub noted.  PMI is over left ventricular apex,  signal discrete and nondisplaced.  ABDOMEN:  Soft, flat, and nontender.  No hepatosplenomegaly or midline  pulsatile mass.  Bowel sounds are present in all quadrants.  EXTERNAL GENITALIA:  Normal male phallus, descended testicles, no lesions.  RECTAL:  Not performed.  EXTREMITIES:  Full range of motion, no edema, intact distal pulses.  NEUROLOGIC:  Cranial nerves II-XII intact.  Motor and sensory grossly  intact.  Gait not tested.  SKIN:  Warm, dry, and clear.   ACCESSORY CLINICAL DATA:  Electrocardiogram shows sinus bradycardia and left  bundle branch block.   White blood cell count 8500, hemoglobin 16.1, hematocrit 45%, platelet count  150,000.  Serum electrolytes:  BUN, creatinine, and glucose all within  normal limits.  Liver-associated enzymes normal.  Point-of-care cardiac  markers:  Myoglobin, CK-MB, and troponin negative x 2.   Chest x-ray results pending.   IMPRESSION:  1.  Symptoms are compatible with unstable angina pectoris.  Rather atypical      as I cannot discern it through exertional component.  2.  History of two-vessel coronary artery disease status post coronary      artery bypass grafting, right radial free graft place.  3.  ASPIRIN sensitivity with peptic ulcer disease.  Absence of aspirin in     his regimen may have increased his risk for early graft closure.  4.   Hypertension.  Certainly demonstrating this today, needs further      control.  5.  Peptic ulcer disease as noted.  Was treated with Protonix for six to      eight weeks.  6.  Sensitivity to DIHYDROPYRIDINE CALCIUM CHANNEL BLOCKERS with lower      extremity edema.  7.  On two alpha  blockers, high-dose clonidine and low-dose terazosin.   PLAN:  1.  Admit to telemetry for monitoring.  2.  Repeat CK-MB and troponin x 2.  3.  Repeat EKG x 1.  4.  Topical nitroglycerin 1 inch paste q.6h.  5.  Hydralazine 10 mg IV now.  6.  Otherwise continue outpatient medications.  7.  Lovenox 90 mg subcutaneously q.12h.  8.  A single dose of coated aspirin 325 mg.  9.  Begin Plavix 75 mg p.o. daily.  10. If patient rules out for myocardial infarction, then would plan for      exercise myocardial perfusion scintigraphy within the next 24 hours.       JHE/MEDQ  D:  05/10/2004  T:  05/10/2004  Job:  [id number]   cc:   Armanda Magic, M.D.  [address], [address]  [place], [place] [address]  Fax: [phone]   Miguel Aschoff, M.D.  [address], [address]  [place]  [place] [address]  Fax: [phone]

## 2010-11-14 NOTE — Op Note (Signed)
NAME:  Darryl [name], Darryl [name] NO.:  [id number]   MEDICAL RECORD NO.:  [id number]                   PATIENT TYPE:  INP   LOCATION:  2313                                 FACILITY:  [facility]   PHYSICIAN:  Salvatore Decent. Cornelius Moras, M.D.              DATE OF BIRTH:  [birth year]   DATE OF PROCEDURE:  12/05/2003  DATE OF DISCHARGE:                                 OPERATIVE REPORT   PREOPERATIVE DIAGNOSES:  Severe three-vessel coronary artery disease with  class I stable angina.   POSTOPERATIVE DIAGNOSES:  Severe three-vessel coronary artery disease with  class I stable angina.   PROCEDURE:  Median sternotomy for coronary artery bypass grafting x3 (left  internal mammary artery to distal left anterior descending coronary artery,  left radial artery to second circumflex marginal branch, saphenous vein  graft to first diagonal branch, saphenous vein graft to posterior descending  coronary artery, endoscopic saphenous vein harvest from right thigh).   SURGEON:  Salvatore Decent. Cornelius Moras, M.D.   ASSISTANT:  Toribio Harbour, N.P.   ANESTHESIA:  General.   BRIEF CLINICAL NOTE:  The patient is a 76 year old male with a history of  hypertension, who is followed by Dr. Judithann Sauger.  He presents with new-  onset symptoms of severe chest pain prompting admission to the hospital on  June 3.  The patient had mildly positive cardiac enzymes with a clinical  picture consistent with class I stable angina.  He underwent cardiac  catheterization by Dr. Armanda Magic and catheterization revealed the  presence of critical 3-vessel coronary artery disease with mild left  ventricular dysfunction.  A full consultation note has been dictated  previously.   OPERATIVE CONSENT:  The patient has been counseled at length regarding the  indications and potential benefits of coronary artery bypass grafting.  Alternative treatment strategies have been discussed.  He understands and  accepts all associated  risks of surgery and desires to proceed as described.   OPERATIVE NOTE IN DETAIL:  The patient is brought to the operating room on  the above mentioned date and central monitoring is established by the  anesthesia service under the care and direction of Dr. Bedelia Person,  specifically, a Swan-Ganz catheter is placed through the right internal  jugular approach.  A right radial arterial line is placed.  Intravenous  antibiotics are administered.  While the patient remains in the preoperative  holding area a pulse oximetry probe is placed on the patient's left index  finger.  The pulse oximetry wave form is continuously monitored while the  left radial pulse is briefly occluded.  There remains normal pulse oximetry  wave form, confirming the presence of intact palmar arch circulation.   The patient was brought to the operating room and general endotracheal  anesthesia is induced uneventfully under the care and direction of Dr.  Gypsy Balsam.  A Foley catheter is placed.  The patient's chest, left upper  extremity, abdomen, both groins, and both lower extremities are prepared and  draped in a sterile manner.   The left radial artery is harvested to be utilized as a conduit for bypass  grafting from the volar aspect of the left forearm.  Initially, an attempted  endoscopic radial artery harvest is performed, but this is aborted due to  some bleeding encountered during the dissection.  Subsequently the left  radial artery is harvested using an open technique through a longitudinal  incision on the volar aspect of the forearm.  All intervening side branches  of the radial artery and its associated veins are divided using the harmonic  scalpel.  The radial artery is harvested from just distal of the antecubital  fossa to just above the wrist.  It appears to be excellent quality vessel.  The distal end of the artery is transected and the distal stump is oversewn  with suture.  The proximal is then  transected and the proximal stump  oversewn with suture after the graft is flushed with heparinized normal  sterile solution.  The arterial conduit is then placed in a small container  of the patient's own heparinized blood with a small amount of papaverine for  storage.  The form is irrigated with saline solution, inspected for  hemostasis and subsequently closed using a 2 layer closure of running  absorbable suture.   Median sternotomy incision is performed and the left internal mammary artery  is dissected from the chest wall and prepared for bypass grafting.  The left  internal mammary artery is good-quality conduit. Simultaneously, saphenous  veins obtained from the patient's right thigh, using the endoscopic vein  harvest technique.  The saphenous vein is good quality conduit.  the  saphenous vein is removed from the thigh and the small surgical incision in  the thigh is closed in multiple layers with absorbable suture.  The patient  is heparinized systemically.   The pericardium is opened.  The ascending aorta and the right atrium are  cannulated for cardiopulmonary bypass.  Adequate heparinization is verified.  Cardiopulmonary bypass is begun and the surface of the heart is inspected.  There is moderate left ventricular hypertrophy and cardiomegaly.  Distal  sights are selected for coronary artery bypass grafting.  Portions of  saphenous vein, the left radial artery and the left internal mammary artery  are all trimmed and prepared for grafting.  A temperature probe is placed in  the left ventricular septum and a cardioplegia catheter is placed in the  ascending aorta.   The patient is allowed to cool passively to 32 degrees systemic temperature.  The aortic cross clamp is applied and cardioplegia is delivered initially in  an antegrade fashion through the aortic root.  Saline slush is applied for topical hypothermia.  The initial cardioplegic rest and myocardial cooling  are  felt to be excellent.  Repeat of doses of cardioplegia are administered  intermittently throughout the cross-clamp portion of the operation.  Both  through the aortic root and down the subsequently placed vein grafts to  maintain septal temperature above 15 degrees centigrade.  The following  distal coronary anastomoses are performed:  1)  The second circumflex  marginal branch is grafted with the left radial artery in an end-to-side  fashion.  This coordinate measures 2.0 mm in diameter and is of good  quality.  2)  The posterior descending coronary artery off of the distal  right coronary artery is grafted with the saphenous vein graft  in an end-to-  side fashion.  This coordinate measured 1.5 mm in diameter and is of fair  quality at the site of distal bypass.  It is diffusely diseased proximally  and distally.  3)  The first diagonal branch off the left anterior  descending coronary artery is grafted with the saphenous vein graft in an  end-to-side fashion.  This coordinate measures 1.5 mm in diameter and is of  good quality at the sight of distal bypass.  4)  The distal left anterior  descending coronary artery is grafted with the left internal mammary artery  in an end-to-side fashion.  This vessel measures 2.0 mm in diameter, and is  of good quality.   All three proximal anastomoses are performed directly to the ascending aorta  prior to removal of the aortic cross clamp.  A short segment of saphenous  vein is utilized to construct the proximal end of the left radial artery  graft, due to the fact that the left radial artery graft is not long enough  to reach all the way to the aorta.  The septal temperature is noted to rise  rapidly and dramatically upon reperfusion of the left internal mammary  artery.  The aortic cross-clamp is removed after a total cross-clamp time of  62 minutes.  Heart begins to beat spontaneously and a slow junctional rhythm  resumes.  All proximal and distal  anastomoses are inspected for hemostasis  and appropriate correct orientation.  After the cardiac pacing wires are  fixed to the right ventricular outflow tract into the right atrial  appendage, the patient is re-warmed at 37 degrees centigrade temperature.  The patient is weaned from cardiopulmonary bypass without difficulty.  The  patient's rhythm at separation from bypass is a slow junctional rhythm.  The  AB sequential pacing is employed.  The total cardiopulmonary bypass time for  the operation is 95 minutes.  No inotropic support is required.   The venous and arterial __________  are removed uneventfully. Protamine is  administered to her as the anticoagulation.  The mediastinum and the left  chest are irrigated with saline solution containing Vancomycin.  Meticulous  surgical hemostasis was ascertained.  The mediastinum and the left chest are drained with three __________  placed through separate stab incisions  inferiorly.  The median sternotomy is closed in a routine fashion.  The soft  tissue anterior to the sternum are closed in multiple layers in a routine  fashion.  All skin incisions were closed with subcuticular skin closures.   The patient tolerated the procedure well and is transported to the surgical  intensive care unit in stable condition.  There were no intraoperative  complications.  All sponge, instrument and needle counts are verified to be  correct at the completion of the operation.  No blood products were  administered.                                               Salvatore Decent. Cornelius Moras, M.D.    CHO/MEDQ  D:  12/05/2003  T:  12/06/2003  Job:  [id number]   cc:   Armanda Magic, M.D.  [address], [address]  [place], [place] [address]  Fax: [phone]   C. Duane Lope, M.D.  [address]  [place]  [place] [address]  Fax: [phone]

## 2010-11-14 NOTE — Discharge Summary (Signed)
NAME:  Darryl [name], Darryl [name] NO.:  [id number]   MEDICAL RECORD NO.:  [id number]          PATIENT TYPE:  INP   LOCATION:  3731                         FACILITY:  [facility]   PHYSICIAN:  Jackie Plum, M.D.DATE OF BIRTH:  05-10-1935   DATE OF ADMISSION:  06/29/2004  DATE OF DISCHARGE:  06/30/2004                                 DISCHARGE SUMMARY   PRIMARY CARE PHYSICIAN:  Miguel Aschoff, M.D.   DIAGNOSES:  1.  Acute gastritis significantly improved at the time of discharge.  2.  Dehydration.   HISTORY:  This patient has a history of coronary artery disease status post  bypass surgery and history of upper GI bleeding in 2005 and hypertension.   DISCHARGE MEDICATIONS:  The patient is to resume preadmission medications  including Protonix 40 daily.   DISCHARGE LABS:  CBC was requested to be repeated at 4:30 this morning so  that patient could leave by 7 in the morning as requested.  However, results  are still pending at 6:45 a.m.  Therefore, results are not available by the  time of discharge.   CONDITION ON DISCHARGE:  Improved.   REASON FOR ADMISSION:  Nausea and vomiting.  The patient presented with  nausea and vomiting which was not alleviated by symptomatic medication  control.  History and physical by Dr. Nehemiah Settle.  The patient's blood sugars  initially 151 with 102 and he was afebrile.  Examination was notable for  clinical dehydration which was mild.  X-ray was negative for acute  infiltrate.  The patient did not have leukocytosis and potassium was 3.7.  He had a mild leukocyte count of 12.6 which was probably secondary to stress  margination .  The patient was therefore, admitted for nausea and vomiting  with a diagnosis of acute gastritis versus gastroenteritis and dehydration.   HOSPITAL COURSE:  The patient was admitted to the hospital and started on IV  fluids with supportive measures.  This morning, the patient is fine and does  not have fever or chills.   The patient has been ambulatory without any  dizziness.  The patient has not had nausea or vomiting since yesterday  afternoon.  The patient is now ready for discharge.  The patient is not  dehydrated, lungs are clear with no gallops in cardiac rhythm.  The abdomen  remains soft and nontender without organomegaly.  Bowel sounds were present.  They were normoactive.  Extremities are negative for edema.  Neurological  exam was nonfocal.   The patient is therefore, being discharged home early this morning so he  could be followed up as an outpatient as needed.      Geor   GO/MEDQ  D:  07/01/2004  T:  07/01/2004  Job:  [id number]   cc:   Miguel Aschoff, M.D.  [address], [address]  [place]  [place] [address]  Fax: [phone]

## 2010-11-14 NOTE — Cardiovascular Report (Signed)
NAME:  Darryl [name], Darryl [name] NO.:  [id number]   MEDICAL RECORD NO.:  [id number]          PATIENT TYPE:  INP   LOCATION:  6527                         FACILITY:  [facility]   PHYSICIAN:  Lyn Records III, M.D.DATE OF BIRTH:  1934/11/08   DATE OF PROCEDURE:  05/14/2004  DATE OF DISCHARGE:                              CARDIAC CATHETERIZATION   INDICATIONS FOR PROCEDURE:  Identification of high grade obstructive lesions  in the saphenous vein graft to the right coronary artery and the radial  artery free graft to the obtuse marginal.   PROCEDURE:  1.  Taxus drug-eluding stent implantation into the saphenous vein graft to      the right.  2.  Taxus stent implantation in the radial artery bypass graft to the obtuse      marginal.   DESCRIPTION OF PROCEDURE:  After informed consent, the patient was brought  to the catheterization lab and a 6 French sheath started in the right  femoral artery after Xylocaine local infiltration.  A 6 [place] multipurpose  catheter was used to reangiogram the saphenous vein graft to the right  coronary artery which appeared to have a tight eccentric proximal stenosis  of at least 75%.  We then proceeded with the interventional procedure  starting first with the radial artery bypass graft, free, to the obtuse  marginal.  We used a 6 French side-hole left coronary bypass graft catheter  and used a Voyager 15 mm long x 2.5 mm diameter predilatation balloon.  Asahi medium wire was used.  We then stented with a 20 mm long x 3.0 mm  Taxus stent to 13 atmospheres with a beautiful angiographic result.  We then  turned our attention to the saphenous vein graft to the right coronary  artery.  The graft was direct stented with a 3.0 x 32 mm Taxus stent to 13  atmospheres with a beautiful angiographic result.  Angiomax was used for  antithrombotic therapy.  ACT was greater than 300.  Angiomax was  discontinued post procedure.  Angioseal arteriotomy closure was  performed  with success.   CONCLUSION:  1.  Successful drug-eluding stent implantation in the saphenous vein graft      to the right coronary artery, reducing the stenosis from 70% to 0%.  2.  Successful Taxus drug-eluding stent implantation in the proximal portion      of the free radiograph to the obtuse      marginal reducing the stenosis from greater than 80% to 0%.  3.  TIMI grade III flow was maintained in both bypass grafts.   PLAN:  Aspirin and Plavix for a year.  Further management per Dr. Mayford Knife.       HWS/MEDQ  D:  05/14/2004  T:  05/15/2004  Job:  [id number]   cc:   Miguel Aschoff, M.D.  [address]  [place]  [place] [address]  Fax: [phone]   Llana Aliment. Malon Kindle., M.D.  [address]  [place]  [place] [address]  Fax: [phone]   Armanda Magic, M.D.  [address], [address]  [place], [place] [address]  Fax: [phone]

## 2010-11-14 NOTE — H&P (Signed)
NAME:  Darryl, [name] NO.:  [id number]   MEDICAL RECORD NO.:  [id number]          PATIENT TYPE:  INP   LOCATION:  1824                         FACILITY:  [facility]   PHYSICIAN:  Deirdre Peer. Polite, M.D. DATE OF BIRTH:  Nov 05, 1934   DATE OF ADMISSION:  06/29/2004  DATE OF DISCHARGE:                                HISTORY & PHYSICAL   CHIEF COMPLAINT:  Nausea and vomiting x24 hours.   HISTORY OF PRESENT ILLNESS:  Darryl [name] is a 76 year old male with a known  history of hypertension, coronary artery disease, history of upper GI bleed  who presented to the ED with complaints of nausea and vomiting x24 hours.  The patient stated symptoms started Sunday morning.  Essentially he has had  nausea and vomiting greater than 10 times.  The patient denies any bloody  emesis.  Denies any unusual intakes, however, did have some fried seafood  which his wife had.  However, she has not had any symptoms.  Denies any  recent antibiotics.  Denies any similar symptoms like this before either.  The patient denies any chest pain, shortness of breath, just nausea and  vomiting.  No change in stool, no change in his urine.  He also denies any  new medications.  The patient was seen in the walk in clinic at another ED  facility and was given Phenergan.  Despite giving that, the patient  continued to have symptoms.  Therefore, the patient presented to the  emergency department for evaluation.  In the emergency department, the  patient was evaluated and found to be somewhat hypotensive with blood  pressure 100/51, pulse 102, and afebrile.  The patient had screening labs  which showed mild leukocytosis of 12.6 and elevated creatinine of 1.8.  We  were called for further evaluation and admission.   At the time of my evaluation, the patient is alert and oriented, no apparent  distress.  She has not had any emesis while in the emergency department.   The patient's labs show leukocytosis and  azotemia with creatinine of 1.8 on  admission and it was deemed necessary for further evaluation and treatment.   PAST MEDICAL HISTORY:  1.  As stated above.  2.  Significant for coronary artery disease, status CABG in June 2005,      status post stent  November 2005.  1.  Hypertension.  2.  History of upper GI bleed in July 2005.   MEDICATIONS:  1.  Avapro 30 mg every day.  2.  Clonidine 0.1 mg every day.  3.  Metoprolol 25 mg b.i.d.  4.  Hydrochlorothiazide 25 mg every day.  5.  Terazosin 1 mg every day.  6.  _________ mg every day.  7.  Protonix 40 mg every day.  8.  Darvocet p.r.n.  9.  Enteric-coated aspirin every day.   SOCIAL HISTORY:  Negative for tobacco, alcohol or drugs.   PAST SURGICAL HISTORY:  Significant for CABG in June 2005.  Status post  cholecystectomy  approximately 10 years ago.  Status post appendectomy  approximately 25 years ago.  Recent cataract surgery  on the left eye less  than one week ago.   ALLERGIES:  No known drug allergies.   FAMILY HISTORY:  Mother unknown.  Father deceased secondary to myocardial  infarction.  The patient has one sister with known history of coronary  artery disease.   REVIEW OF SYSTEMS:  As stated in the HPI.   PHYSICAL EXAMINATION:  VITAL SIGNS:  Temperature 97.0, blood pressure  100/51, pulse 102, respiratory rate of 18.  O2 saturation 98%.  HEENT:  Pupils equal, round, reactive to light.  Nonicteric.  The patient  does have mild subconjunctival hemorrhage in the left eye from prior  cataract surgery.  Oral mucosa is dry.  NECK:  No JVD.  CHEST:  Clear to auscultation bilaterally.  CARDIOVASCULAR:  Regular rate and rhythm.  No S3.  ABDOMEN:  Soft, nontender, no pain in the right upper quadrant or left lower  quadrant.  No hepatosplenomegaly.  EXTREMITIES:  No clubbing, cyanosis or edema.  Pulses 2+.  RECTAL:  Exam deferred.  NEUROLOGICAL:  Nonfocal.   LABORATORY DATA:  Chest x-ray showed signs of prior CABG,  otherwise without  infiltrate.  No CHF.  B-MET:  Sodium 139, potassium 3.7, chloride 107, BUN  31, creatinine 1.8.  CBC:  White count 12.6, hemoglobin 15.2, platelets  155,000.  Neutrophil count 93%.  Enzymes:  Myoglobin 163, CK-MB 1, troponin  less than 0.05.  Urinalysis is pending at the time of this dictation.   ASSESSMENT:  1.  Nausea and vomiting x24 hours.  Differential diagnosis includes      gastroenteritis versus gastritis versus gallbladder versus an occult      infection.  2.  Dehydration secondary to #1.  3.  Acute renal failure, creatinine 1.8.  4.  Coronary artery disease, status post coronary artery bypass graft in      June 2005 with repeat catheterization in November 2005 with stent      placement.  5.  History of upper gastrointestinal bleed July 2005.  Please note repeat      esophagogastroduodenoscopy November 2005 only showed gastric erosions.  6.  Hypertension.  7.  Leukocytosis.   PLAN:  The patient will be admitted to a telemetry floor bed.  The patient  will have serial cardiac enzymes and will be provided with antiemetics and  b.i.d. PPI.  We will also rule out an infectious etiology, a pneumonia  versus UTI.  The patient will be given cautious IV fluids.  Will have  followup B-MET.  We will obtain a complete C-MET and possibly abdominal  ultrasound to evaluate possible gallbladder disease although the patient  does not have any pain in his belly, particularly in the right upper  quadrant.  Currently, the patient's CBG is 182.  We will obtain a fasting  CBG and may need to update hemoglobin A1c to rule out possibility of  diabetes.  We will make further recommendations after review of above  studies.  In addition, we will also hold the patient's diuretic as he has  significant azotemia at this time.      Joen Laura   RDP/MEDQ  D:  06/30/2004  T:  06/30/2004  Job:  [id number]   cc:   _________ __________, M.D. [facility]

## 2011-07-15 DIAGNOSIS — Z125 Encounter for screening for malignant neoplasm of prostate: Secondary | ICD-10-CM | POA: Diagnosis not present

## 2011-07-15 DIAGNOSIS — I10 Essential (primary) hypertension: Secondary | ICD-10-CM | POA: Diagnosis not present

## 2011-07-15 DIAGNOSIS — Z0000 Encounter for general adult medical examination without abnormal findings: Secondary | ICD-10-CM | POA: Diagnosis not present

## 2011-07-15 DIAGNOSIS — Z23 Encounter for immunization: Secondary | ICD-10-CM | POA: Diagnosis not present

## 2011-07-15 DIAGNOSIS — Z79899 Other long term (current) drug therapy: Secondary | ICD-10-CM | POA: Diagnosis not present

## 2011-07-15 DIAGNOSIS — E7800 Pure hypercholesterolemia, unspecified: Secondary | ICD-10-CM | POA: Diagnosis not present

## 2011-07-15 DIAGNOSIS — R7301 Impaired fasting glucose: Secondary | ICD-10-CM | POA: Diagnosis not present

## 2011-07-15 DIAGNOSIS — R259 Unspecified abnormal involuntary movements: Secondary | ICD-10-CM | POA: Diagnosis not present

## 2012-04-09 DIAGNOSIS — Z23 Encounter for immunization: Secondary | ICD-10-CM | POA: Diagnosis not present

## 2012-05-19 DIAGNOSIS — H04129 Dry eye syndrome of unspecified lacrimal gland: Secondary | ICD-10-CM | POA: Diagnosis not present

## 2012-07-19 DIAGNOSIS — R6889 Other general symptoms and signs: Secondary | ICD-10-CM | POA: Diagnosis not present

## 2012-07-19 DIAGNOSIS — D696 Thrombocytopenia, unspecified: Secondary | ICD-10-CM | POA: Diagnosis not present

## 2012-07-19 DIAGNOSIS — H6120 Impacted cerumen, unspecified ear: Secondary | ICD-10-CM | POA: Diagnosis not present

## 2012-07-19 DIAGNOSIS — M549 Dorsalgia, unspecified: Secondary | ICD-10-CM | POA: Diagnosis not present

## 2012-07-19 DIAGNOSIS — I10 Essential (primary) hypertension: Secondary | ICD-10-CM | POA: Diagnosis not present

## 2012-07-19 DIAGNOSIS — Z125 Encounter for screening for malignant neoplasm of prostate: Secondary | ICD-10-CM | POA: Diagnosis not present

## 2012-07-19 DIAGNOSIS — Z0000 Encounter for general adult medical examination without abnormal findings: Secondary | ICD-10-CM | POA: Diagnosis not present

## 2012-07-19 DIAGNOSIS — E782 Mixed hyperlipidemia: Secondary | ICD-10-CM | POA: Diagnosis not present

## 2012-07-19 DIAGNOSIS — R7301 Impaired fasting glucose: Secondary | ICD-10-CM | POA: Diagnosis not present

## 2012-08-30 DIAGNOSIS — L819 Disorder of pigmentation, unspecified: Secondary | ICD-10-CM | POA: Diagnosis not present

## 2012-08-30 DIAGNOSIS — L570 Actinic keratosis: Secondary | ICD-10-CM | POA: Diagnosis not present

## 2012-10-05 DIAGNOSIS — G5710 Meralgia paresthetica, unspecified lower limb: Secondary | ICD-10-CM | POA: Diagnosis not present

## 2012-10-05 DIAGNOSIS — M5137 Other intervertebral disc degeneration, lumbosacral region: Secondary | ICD-10-CM | POA: Diagnosis not present

## 2012-10-05 DIAGNOSIS — IMO0002 Reserved for concepts with insufficient information to code with codable children: Secondary | ICD-10-CM | POA: Diagnosis not present

## 2012-10-05 DIAGNOSIS — M47817 Spondylosis without myelopathy or radiculopathy, lumbosacral region: Secondary | ICD-10-CM | POA: Diagnosis not present

## 2012-10-09 DIAGNOSIS — M545 Low back pain: Secondary | ICD-10-CM | POA: Diagnosis not present

## 2012-10-09 DIAGNOSIS — M47817 Spondylosis without myelopathy or radiculopathy, lumbosacral region: Secondary | ICD-10-CM | POA: Diagnosis not present

## 2012-10-09 DIAGNOSIS — M5126 Other intervertebral disc displacement, lumbar region: Secondary | ICD-10-CM | POA: Diagnosis not present

## 2012-10-19 DIAGNOSIS — IMO0002 Reserved for concepts with insufficient information to code with codable children: Secondary | ICD-10-CM | POA: Diagnosis not present

## 2012-10-19 DIAGNOSIS — M5137 Other intervertebral disc degeneration, lumbosacral region: Secondary | ICD-10-CM | POA: Diagnosis not present

## 2012-10-19 DIAGNOSIS — M47817 Spondylosis without myelopathy or radiculopathy, lumbosacral region: Secondary | ICD-10-CM | POA: Diagnosis not present

## 2012-11-02 DIAGNOSIS — IMO0002 Reserved for concepts with insufficient information to code with codable children: Secondary | ICD-10-CM | POA: Diagnosis not present

## 2012-11-02 DIAGNOSIS — M47817 Spondylosis without myelopathy or radiculopathy, lumbosacral region: Secondary | ICD-10-CM | POA: Diagnosis not present

## 2012-11-02 DIAGNOSIS — M5137 Other intervertebral disc degeneration, lumbosacral region: Secondary | ICD-10-CM | POA: Diagnosis not present

## 2012-11-30 DIAGNOSIS — IMO0002 Reserved for concepts with insufficient information to code with codable children: Secondary | ICD-10-CM | POA: Diagnosis not present

## 2012-11-30 DIAGNOSIS — M5137 Other intervertebral disc degeneration, lumbosacral region: Secondary | ICD-10-CM | POA: Diagnosis not present

## 2012-11-30 DIAGNOSIS — M47817 Spondylosis without myelopathy or radiculopathy, lumbosacral region: Secondary | ICD-10-CM | POA: Diagnosis not present

## 2012-12-14 DIAGNOSIS — M47817 Spondylosis without myelopathy or radiculopathy, lumbosacral region: Secondary | ICD-10-CM | POA: Diagnosis not present

## 2012-12-14 DIAGNOSIS — IMO0002 Reserved for concepts with insufficient information to code with codable children: Secondary | ICD-10-CM | POA: Diagnosis not present

## 2012-12-14 DIAGNOSIS — M5137 Other intervertebral disc degeneration, lumbosacral region: Secondary | ICD-10-CM | POA: Diagnosis not present

## 2013-01-06 DIAGNOSIS — M47817 Spondylosis without myelopathy or radiculopathy, lumbosacral region: Secondary | ICD-10-CM | POA: Diagnosis not present

## 2013-01-06 DIAGNOSIS — M5137 Other intervertebral disc degeneration, lumbosacral region: Secondary | ICD-10-CM | POA: Diagnosis not present

## 2013-01-06 DIAGNOSIS — IMO0002 Reserved for concepts with insufficient information to code with codable children: Secondary | ICD-10-CM | POA: Diagnosis not present

## 2013-01-24 DIAGNOSIS — IMO0002 Reserved for concepts with insufficient information to code with codable children: Secondary | ICD-10-CM | POA: Diagnosis not present

## 2013-01-27 DIAGNOSIS — IMO0002 Reserved for concepts with insufficient information to code with codable children: Secondary | ICD-10-CM | POA: Diagnosis not present

## 2013-01-27 DIAGNOSIS — M6281 Muscle weakness (generalized): Secondary | ICD-10-CM | POA: Diagnosis not present

## 2013-02-02 DIAGNOSIS — M6281 Muscle weakness (generalized): Secondary | ICD-10-CM | POA: Diagnosis not present

## 2013-02-02 DIAGNOSIS — IMO0002 Reserved for concepts with insufficient information to code with codable children: Secondary | ICD-10-CM | POA: Diagnosis not present

## 2013-02-07 DIAGNOSIS — IMO0002 Reserved for concepts with insufficient information to code with codable children: Secondary | ICD-10-CM | POA: Diagnosis not present

## 2013-02-17 DIAGNOSIS — M47817 Spondylosis without myelopathy or radiculopathy, lumbosacral region: Secondary | ICD-10-CM | POA: Diagnosis not present

## 2013-02-17 DIAGNOSIS — IMO0002 Reserved for concepts with insufficient information to code with codable children: Secondary | ICD-10-CM | POA: Diagnosis not present

## 2013-02-17 DIAGNOSIS — M5137 Other intervertebral disc degeneration, lumbosacral region: Secondary | ICD-10-CM | POA: Diagnosis not present

## 2013-03-26 DIAGNOSIS — R350 Frequency of micturition: Secondary | ICD-10-CM | POA: Diagnosis not present

## 2013-03-26 DIAGNOSIS — R319 Hematuria, unspecified: Secondary | ICD-10-CM | POA: Diagnosis not present

## 2013-03-31 DIAGNOSIS — N402 Nodular prostate without lower urinary tract symptoms: Secondary | ICD-10-CM | POA: Diagnosis not present

## 2013-03-31 DIAGNOSIS — E039 Hypothyroidism, unspecified: Secondary | ICD-10-CM | POA: Diagnosis not present

## 2013-03-31 DIAGNOSIS — R7301 Impaired fasting glucose: Secondary | ICD-10-CM | POA: Diagnosis not present

## 2013-03-31 DIAGNOSIS — R300 Dysuria: Secondary | ICD-10-CM | POA: Diagnosis not present

## 2013-03-31 DIAGNOSIS — E7800 Pure hypercholesterolemia, unspecified: Secondary | ICD-10-CM | POA: Diagnosis not present

## 2013-04-06 DIAGNOSIS — M5126 Other intervertebral disc displacement, lumbar region: Secondary | ICD-10-CM | POA: Diagnosis not present

## 2013-04-10 ENCOUNTER — Encounter: Payer: Self-pay | Admitting: *Deleted

## 2013-04-10 ENCOUNTER — Encounter: Payer: Self-pay | Admitting: Cardiology

## 2013-04-13 ENCOUNTER — Encounter: Payer: Self-pay | Admitting: Cardiology

## 2013-04-13 ENCOUNTER — Ambulatory Visit (INDEPENDENT_AMBULATORY_CARE_PROVIDER_SITE_OTHER): Payer: Medicare Other | Admitting: Cardiology

## 2013-04-13 VITALS — BP 132/80 | HR 56 | Ht 68.0 in | Wt 201.0 lb

## 2013-04-13 DIAGNOSIS — Z01818 Encounter for other preprocedural examination: Secondary | ICD-10-CM | POA: Diagnosis not present

## 2013-04-13 DIAGNOSIS — I2510 Atherosclerotic heart disease of native coronary artery without angina pectoris: Secondary | ICD-10-CM | POA: Diagnosis not present

## 2013-04-13 DIAGNOSIS — E785 Hyperlipidemia, unspecified: Secondary | ICD-10-CM | POA: Insufficient documentation

## 2013-04-13 DIAGNOSIS — I10 Essential (primary) hypertension: Secondary | ICD-10-CM | POA: Insufficient documentation

## 2013-04-13 DIAGNOSIS — M47816 Spondylosis without myelopathy or radiculopathy, lumbar region: Secondary | ICD-10-CM | POA: Insufficient documentation

## 2013-04-13 DIAGNOSIS — I447 Left bundle-branch block, unspecified: Secondary | ICD-10-CM | POA: Insufficient documentation

## 2013-04-13 MED ORDER — FAMOTIDINE 20 MG PO TABS: 20.0000 mg | ORAL_TABLET | Freq: Every day | ORAL | Status: DC

## 2013-04-13 NOTE — Patient Instructions (Addendum)
Your physician recommends that you continue on your current medications as directed. Please refer to the Current Medication list given to you today.  Your physician has requested that you have a lexiscan myoview. For further information please visit [email or web address] Please follow instruction sheet, as given. Please Schedule at [facility] for Tuesday 10/21 since Dr. Mayford Knife is DOD that day.   Your physician recommends that you schedule a follow-up appointment in: 1 year with Dr. Mayford Knife

## 2013-04-13 NOTE — Addendum Note (Signed)
Addended by: Nita Sells on: 04/13/2013 12:03 PM   Modules accepted: Orders, Medications

## 2013-04-13 NOTE — Addendum Note (Signed)
Addended by: Nita Sells on: 04/13/2013 02:09 PM   Modules accepted: Orders

## 2013-04-13 NOTE — Progress Notes (Signed)
[address] [place], [place]  [address] Phone: [phone] Fax:  [phone]  Date:  04/13/2013   ID:  Darryl [name], DOB 25-May-1935, MRN [id number]  PCP:  No primary provider on file.  Cardiologist:  Armanda Magic, MD    History of Present Illness: Darryl [name] is a 78 y.o. male with a history of CAD/HTN/chronic LBBB/dyslipidemia who presents today for preoperative clearance for back surgery due to back pain from DJD of the lumbar spine.  He denies any chest pain, SOB, DOE, LE edema, dizziness, palpitations or syncope.  His activity level is limited by his back pain.     Wt Readings from Last 3 Encounters:  No data found for Wt     Past Medical History  Diagnosis Date  . Hypercholesteremia   . LBBB (left bundle branch block)   . Sleep disturbance   . Insomnia   . Tremor   . HTN (hypertension)   . Dyslipidemia   . Thrombocytopenia   . Colon polyp   . Diverticulosis   . Renal insufficiency     renal duplex scan in 2012 showed no renal artery stenosis  . Coronary atherosclerosis of native coronary artery     ASCAD s/p CABG 6/200 a5, s/p PCI of the SVG to PDA and radial artery graft to OM-2 11/05, PCI left circumflex spring of 2010,   . Gastric ulcer   . Lumbar spondylolysis     multilevel DJD with mild spinal stenosis L3-L5    Current Outpatient Prescriptions  Medication Sig Dispense Refill  . ALPRAZolam (XANAX) 0.25 MG tablet Take 1 tablet by mouth daily as needed.      [name] aspirin 325 MG tablet Take 325 mg by mouth daily.      . cloNIDine (CATAPRES) 0.2 MG tablet Take 1 tablet by mouth 2 (two) times daily.      [name] HYDROcodone-acetaminophen (NORCO) 7.5-325 MG per tablet Take 1 tablet by mouth as needed.      . irbesartan (AVAPRO) 300 MG tablet Take 1 tablet by mouth daily.      [name] lisinopril (PRINIVIL,ZESTRIL) 20 MG tablet Take 1 tablet by mouth 2 (two) times daily.       . metoprolol tartrate (LOPRESSOR) 25 MG tablet Take 1 tablet by mouth 2 (two)  times daily.      . Omega-3 Fatty Acids (FISH OIL PO) Take by mouth daily.      . pantoprazole (PROTONIX) 40 MG tablet Take 1 tablet by mouth daily.      . temazepam (RESTORIL) 15 MG capsule Take 1 capsule by mouth daily.      [name] terazosin (HYTRIN) 1 MG capsule Take 1 capsule by mouth daily.      . traMADol (ULTRAM) 50 MG tablet Take 1 tablet by mouth as needed.       No current facility-administered medications for this visit.    Allergies:    Allergies  Allergen Reactions  . Altace [Ramipril]   . Biaxin [Clarithromycin]   . Celebrex [Celecoxib]   . Nsaids   . Tetracyclines & Related   . Toprol Xl [Metoprolol Tartrate]     Social History:  The patient     Family History:  The patient's family history includes CAD in an other family member.   ROS:  Please see the history of present illness.      All other systems reviewed and negative.   PHYSICAL EXAM: VS:  There were no vitals taken  for this visit. Well nourished, well developed, in no acute distress HEENT: normal Neck: no JVD Cardiac:  normal S1, S2; RRR; no murmur Lungs:  clear to auscultation bilaterally, no wheezing, rhonchi or rales Abd: soft, nontender, no hepatomegaly Ext: no edema Skin: warm and dry Neuro:  CNs 2-12 intact, no focal abnormalities noted  EKG:  Sinus bradycardia with LBBB     ASSESSMENT AND PLAN:  1. ASCAD s/p remote CABG and subsequent PCI  - continue ASA 2. Chronic LBBB 3. Dyslipidemia - LDL was 51 on labs 03/2013  - continue fish oil (statin intolerant) 4. HTN  - continue Clonidine/Avapro/Lisinopril/Metoprolol/Hytrin 5. Lumbar DJD with back pain 6. Preoperative cardiac clearance  - He has not had any noninvasive ischemic workup in sometime.  Since he is going to undergo general anesthesia I have recommended proceeding with Clarene Duke to rule out ischemia prior to undergoing surgery.  Followup with me in 1 year  Signed, Armanda Magic, MD 04/13/2013 11:10 AM

## 2013-04-14 ENCOUNTER — Other Ambulatory Visit: Payer: Self-pay | Admitting: Neurosurgery

## 2013-04-14 DIAGNOSIS — Z23 Encounter for immunization: Secondary | ICD-10-CM | POA: Diagnosis not present

## 2013-04-18 ENCOUNTER — Encounter (HOSPITAL_COMMUNITY)
Admission: RE | Admit: 2013-04-18 | Discharge: 2013-04-18 | Disposition: A | Discharge status: Home / Self Care | Payer: Medicare Other | Source: Ambulatory Visit | Attending: Cardiology | Admitting: Cardiology

## 2013-04-18 ENCOUNTER — Other Ambulatory Visit: Payer: Self-pay

## 2013-04-18 DIAGNOSIS — M5126 Other intervertebral disc displacement, lumbar region: Secondary | ICD-10-CM | POA: Insufficient documentation

## 2013-04-18 DIAGNOSIS — I2510 Atherosclerotic heart disease of native coronary artery without angina pectoris: Secondary | ICD-10-CM

## 2013-04-18 DIAGNOSIS — I10 Essential (primary) hypertension: Secondary | ICD-10-CM | POA: Diagnosis not present

## 2013-04-18 DIAGNOSIS — Z951 Presence of aortocoronary bypass graft: Secondary | ICD-10-CM | POA: Diagnosis not present

## 2013-04-18 DIAGNOSIS — R079 Chest pain, unspecified: Secondary | ICD-10-CM | POA: Insufficient documentation

## 2013-04-18 DIAGNOSIS — Z01818 Encounter for other preprocedural examination: Secondary | ICD-10-CM | POA: Diagnosis not present

## 2013-04-18 DIAGNOSIS — Z01810 Encounter for preprocedural cardiovascular examination: Secondary | ICD-10-CM | POA: Insufficient documentation

## 2013-04-18 DIAGNOSIS — I252 Old myocardial infarction: Secondary | ICD-10-CM | POA: Insufficient documentation

## 2013-04-18 MED ORDER — REGADENOSON 0.4 MG/5ML IV SOLN
INTRAVENOUS | Status: AC
  Filled 2013-04-18: qty 5

## 2013-04-18 MED ORDER — TECHNETIUM TC 99M SESTAMIBI GENERIC - CARDIOLITE
10.0000 | Freq: Once | INTRAVENOUS | Status: AC | PRN
  Administered 2013-04-18: 10 via INTRAVENOUS

## 2013-04-18 MED ORDER — TECHNETIUM TC 99M SESTAMIBI GENERIC - CARDIOLITE
30.0000 | Freq: Once | INTRAVENOUS | Status: AC | PRN
  Administered 2013-04-18: 30 via INTRAVENOUS

## 2013-04-18 MED ORDER — REGADENOSON 0.4 MG/5ML IV SOLN
0.4000 mg | Freq: Once | INTRAVENOUS | Status: AC
  Administered 2013-04-18: 0.4 mg via INTRAVENOUS

## 2013-04-27 ENCOUNTER — Other Ambulatory Visit (HOSPITAL_COMMUNITY): Payer: Self-pay | Admitting: *Deleted

## 2013-04-27 ENCOUNTER — Encounter (HOSPITAL_COMMUNITY)
Admission: RE | Admit: 2013-04-27 | Discharge: 2013-04-27 | Disposition: A | Discharge status: Home / Self Care | Payer: Medicare Other | Source: Ambulatory Visit | Attending: Anesthesiology | Admitting: Anesthesiology

## 2013-04-27 ENCOUNTER — Encounter (HOSPITAL_COMMUNITY)
Admission: RE | Admit: 2013-04-27 | Discharge: 2013-04-27 | Disposition: A | Discharge status: Home / Self Care | Payer: Medicare Other | Source: Ambulatory Visit | Attending: Neurosurgery | Admitting: Neurosurgery

## 2013-04-27 ENCOUNTER — Encounter (HOSPITAL_COMMUNITY): Payer: Self-pay

## 2013-04-27 DIAGNOSIS — Z01818 Encounter for other preprocedural examination: Secondary | ICD-10-CM | POA: Diagnosis not present

## 2013-04-27 DIAGNOSIS — R9389 Abnormal findings on diagnostic imaging of other specified body structures: Secondary | ICD-10-CM | POA: Diagnosis not present

## 2013-04-27 DIAGNOSIS — Z01812 Encounter for preprocedural laboratory examination: Secondary | ICD-10-CM | POA: Insufficient documentation

## 2013-04-27 HISTORY — DX: Headache: R51

## 2013-04-27 HISTORY — DX: Unspecified osteoarthritis, unspecified site: M19.90

## 2013-04-27 LAB — CBC
Hemoglobin: 15.2 g/dL (ref 13.0–17.0)
MCH: 32.9 pg (ref 26.0–34.0)
MCHC: 37.3 g/dL — ABNORMAL HIGH (ref 30.0–36.0)
Platelets: 114 10*3/uL — ABNORMAL LOW (ref 150–400)
RDW: 13.1 % (ref 11.5–15.5)

## 2013-04-27 LAB — BASIC METABOLIC PANEL
BUN: 17 mg/dL (ref 6–23)
Chloride: 106 mEq/L (ref 96–112)
Creatinine, Ser: 1.34 mg/dL (ref 0.50–1.35)
GFR calc Af Amer: 58 mL/min — ABNORMAL LOW (ref >90)
Sodium: 139 mEq/L (ref 135–145)

## 2013-04-27 LAB — SURGICAL PCR SCREEN: MRSA, PCR: NEGATIVE

## 2013-04-27 NOTE — Progress Notes (Signed)
Anesthesia Chart Review:  Patient is a 78 year old male posted for one level lumbar laminectomy/decompression microdiscectomy on 05/03/14 by Dr. Wynetta Emery.  History includes former smoker, hypercholesterolemia, left BBB, HTN, tremor, diverticulosis, renal insufficiency, thrombocytopenia, CAD s/p CABG (LIMA-LAD, left RA-OM2, SVG-D1) 11/2003 with DES to SVG-PDA and RA-OM2 04/2004 and BMS to distal CX/OM3 11/2008, gastric ulcer, arthritis, headaches, insomnia.  PCP is listed in Epic as Dr. Catha Gosselin.  Cardiologist is Dr. Armanda Magic who cleared patient following a non-ischemic stress test on 04/18/13 (see below).  EKG on 04/13/13 showed SR, left BBB.  Nuclear stress test on 04/18/13 showed: 1. No evidence for inducible ischemia.  2. Remote infarct involving the septum and inferior wall resulting and akinetic (septum and inferior wall) and dyskinetic (distal septum) segments.  3. Estimated ejection fraction of 58%, similar to the prior exam.   Cardiac cath on 11/19/08 showed severe 3V CAD with patent LIMA-LAD, patent SVG-DIAG, occluded SVG-PDA, and occluded RA-OM2.  EF 48%.  He subsequently underwent successful BMS to distal CX/OM3.  There was 50% mid-CX stenosis, but it was not felt to be hemodynamically significant. (Both cath reports found in E-chart and not in Epic.)  CXR on 04/27/13 showed no acute cardiopulmonary process.  Preoperative labs noted.  Cr 1.34, glucose 105.  WBC 6.5, H/H 15.2/40.8. PLT 114K which appears stable when compared to labs from 2010.  If no acute changes then I would anticipate that he could proceed as planned.  Velna Ochs [facility]/Anesthesiology Phone [phone] 04/27/2013 4:41 PM

## 2013-04-27 NOTE — Pre-Procedure Instructions (Addendum)
AADON GORELIK  04/27/2013   Your procedure is scheduled on:  05/03/13  Report to [facility]  2 * 3 at 630 AM.  Call this number if you have problems the morning of surgery: [phone]   Remember:   Do not eat food or drink liquids after midnight.   Take these medicines the morning of surgery with A SIP OF WATER: xanax, pain med if needed,clonidine,metoprolol,terazosin, protonix           STOP omega 3, aspirin 04/28/13   Do not wear jewelry, make-up or nail polish.  Do not wear lotions, powders, or perfumes. You may wear deodorant.  Do not shave 48 hours prior to surgery. Men may shave face and neck.  Do not bring valuables to the hospital.  [facility] is not responsible                  for any belongings or valuables.               Contacts, dentures or bridgework may not be worn into surgery.  Leave suitcase in the car. After surgery it may be brought to your room.  For patients admitted to the hospital, discharge time is determined by your                treatment team.               Patients discharged the day of surgery will not be allowed to drive  home.  Name and phone number of your driver:   Special Instructions: Shower using CHG 2 nights before surgery and the night before surgery.  If you shower the day of surgery use CHG.  Use special wash - you have one bottle of CHG for all showers.  You should use approximately 1/3 of the bottle for each shower.   Please read over the following fact sheets that you were given: Pain Booklet, Coughing and Deep Breathing, MRSA Information and Surgical Site Infection Prevention

## 2013-05-02 ENCOUNTER — Encounter (HOSPITAL_COMMUNITY): Payer: Self-pay | Admitting: Certified Registered Nurse Anesthetist

## 2013-05-02 MED ORDER — CEFAZOLIN SODIUM-DEXTROSE 2-3 GM-% IV SOLR
2.0000 g | INTRAVENOUS | Status: AC
  Administered 2013-05-03: 2 g via INTRAVENOUS
  Filled 2013-05-02 (×2): qty 50

## 2013-05-03 ENCOUNTER — Encounter (HOSPITAL_COMMUNITY)
Admission: RE | Disposition: A | Discharge status: Home / Self Care | Payer: Self-pay | Source: Ambulatory Visit | Attending: Neurosurgery

## 2013-05-03 ENCOUNTER — Encounter (HOSPITAL_COMMUNITY): Payer: Self-pay | Admitting: *Deleted

## 2013-05-03 ENCOUNTER — Encounter (HOSPITAL_COMMUNITY): Payer: Medicare Other | Admitting: Vascular Surgery

## 2013-05-03 ENCOUNTER — Inpatient Hospital Stay (HOSPITAL_COMMUNITY)
Admission: RE | Admit: 2013-05-03 | Discharge: 2013-05-04 | DRG: 520 | Disposition: A | Discharge status: Home / Self Care | Payer: Medicare Other | Source: Ambulatory Visit | Attending: Neurosurgery | Admitting: Neurosurgery

## 2013-05-03 ENCOUNTER — Inpatient Hospital Stay (HOSPITAL_COMMUNITY): Payer: Medicare Other | Admitting: Certified Registered Nurse Anesthetist

## 2013-05-03 ENCOUNTER — Inpatient Hospital Stay (HOSPITAL_COMMUNITY): Payer: Medicare Other

## 2013-05-03 DIAGNOSIS — Z881 Allergy status to other antibiotic agents status: Secondary | ICD-10-CM

## 2013-05-03 DIAGNOSIS — E7800 Pure hypercholesterolemia, unspecified: Secondary | ICD-10-CM | POA: Diagnosis present

## 2013-05-03 DIAGNOSIS — Z9089 Acquired absence of other organs: Secondary | ICD-10-CM | POA: Diagnosis not present

## 2013-05-03 DIAGNOSIS — Z79899 Other long term (current) drug therapy: Secondary | ICD-10-CM | POA: Diagnosis not present

## 2013-05-03 DIAGNOSIS — Z951 Presence of aortocoronary bypass graft: Secondary | ICD-10-CM | POA: Diagnosis not present

## 2013-05-03 DIAGNOSIS — E785 Hyperlipidemia, unspecified: Secondary | ICD-10-CM | POA: Diagnosis present

## 2013-05-03 DIAGNOSIS — M5126 Other intervertebral disc displacement, lumbar region: Principal | ICD-10-CM | POA: Diagnosis present

## 2013-05-03 DIAGNOSIS — Z7982 Long term (current) use of aspirin: Secondary | ICD-10-CM | POA: Diagnosis not present

## 2013-05-03 DIAGNOSIS — I10 Essential (primary) hypertension: Secondary | ICD-10-CM | POA: Diagnosis present

## 2013-05-03 DIAGNOSIS — Z886 Allergy status to analgesic agent status: Secondary | ICD-10-CM | POA: Diagnosis not present

## 2013-05-03 DIAGNOSIS — F411 Generalized anxiety disorder: Secondary | ICD-10-CM | POA: Diagnosis present

## 2013-05-03 DIAGNOSIS — I2510 Atherosclerotic heart disease of native coronary artery without angina pectoris: Secondary | ICD-10-CM | POA: Diagnosis present

## 2013-05-03 DIAGNOSIS — Z8249 Family history of ischemic heart disease and other diseases of the circulatory system: Secondary | ICD-10-CM | POA: Diagnosis not present

## 2013-05-03 DIAGNOSIS — Z888 Allergy status to other drugs, medicaments and biological substances status: Secondary | ICD-10-CM

## 2013-05-03 DIAGNOSIS — G4700 Insomnia, unspecified: Secondary | ICD-10-CM | POA: Diagnosis present

## 2013-05-03 DIAGNOSIS — Q762 Congenital spondylolisthesis: Secondary | ICD-10-CM | POA: Diagnosis not present

## 2013-05-03 DIAGNOSIS — Z9861 Coronary angioplasty status: Secondary | ICD-10-CM

## 2013-05-03 DIAGNOSIS — M519 Unspecified thoracic, thoracolumbar and lumbosacral intervertebral disc disorder: Secondary | ICD-10-CM | POA: Diagnosis not present

## 2013-05-03 DIAGNOSIS — D126 Benign neoplasm of colon, unspecified: Secondary | ICD-10-CM | POA: Diagnosis not present

## 2013-05-03 DIAGNOSIS — Z87891 Personal history of nicotine dependence: Secondary | ICD-10-CM | POA: Diagnosis not present

## 2013-05-03 DIAGNOSIS — M79609 Pain in unspecified limb: Secondary | ICD-10-CM | POA: Diagnosis not present

## 2013-05-03 HISTORY — PX: LUMBAR LAMINECTOMY/DECOMPRESSION MICRODISCECTOMY: SHX5026

## 2013-05-03 SURGERY — LUMBAR LAMINECTOMY/DECOMPRESSION MICRODISCECTOMY 1 LEVEL
Anesthesia: General | Site: Back | Wound class: Clean

## 2013-05-03 MED ORDER — ACETAMINOPHEN 325 MG PO TABS: 650.0000 mg | ORAL_TABLET | ORAL | Status: DC | PRN

## 2013-05-03 MED ORDER — ROCURONIUM BROMIDE 100 MG/10ML IV SOLN
INTRAVENOUS | Status: DC | PRN
  Administered 2013-05-03: 50 mg via INTRAVENOUS

## 2013-05-03 MED ORDER — IRBESARTAN 300 MG PO TABS
300.0000 mg | ORAL_TABLET | Freq: Every morning | ORAL | Status: DC
  Administered 2013-05-03: 300 mg via ORAL
  Filled 2013-05-03 (×2): qty 1

## 2013-05-03 MED ORDER — FENTANYL CITRATE 0.05 MG/ML IJ SOLN
INTRAMUSCULAR | Status: DC | PRN
  Administered 2013-05-03: 200 ug via INTRAVENOUS
  Administered 2013-05-03: 50 ug via INTRAVENOUS

## 2013-05-03 MED ORDER — PROPOFOL 10 MG/ML IV BOLUS
INTRAVENOUS | Status: DC | PRN
  Administered 2013-05-03: 200 mg via INTRAVENOUS

## 2013-05-03 MED ORDER — HYDROMORPHONE HCL PF 1 MG/ML IJ SOLN
0.2500 mg | INTRAMUSCULAR | Status: DC | PRN
  Administered 2013-05-03 (×2): 0.5 mg via INTRAVENOUS

## 2013-05-03 MED ORDER — THROMBIN 5000 UNITS EX SOLR
CUTANEOUS | Status: DC | PRN
  Administered 2013-05-03 (×2): 5000 [IU] via TOPICAL

## 2013-05-03 MED ORDER — TRAMADOL HCL 50 MG PO TABS: 50.0000 mg | ORAL_TABLET | Freq: Four times a day (QID) | ORAL | Status: DC | PRN

## 2013-05-03 MED ORDER — ONDANSETRON HCL 4 MG/2ML IJ SOLN
INTRAMUSCULAR | Status: DC | PRN
  Administered 2013-05-03: 4 mg via INTRAVENOUS

## 2013-05-03 MED ORDER — ONDANSETRON HCL 4 MG/2ML IJ SOLN: 4.0000 mg | INTRAMUSCULAR | Status: DC | PRN

## 2013-05-03 MED ORDER — OXYCODONE HCL 5 MG PO TABS: 5.0000 mg | ORAL_TABLET | Freq: Once | ORAL | Status: DC | PRN

## 2013-05-03 MED ORDER — CLONIDINE HCL 0.2 MG PO TABS
0.2000 mg | ORAL_TABLET | Freq: Two times a day (BID) | ORAL | Status: DC
  Administered 2013-05-03: 0.2 mg via ORAL
  Filled 2013-05-03 (×3): qty 1

## 2013-05-03 MED ORDER — BUPIVACAINE HCL (PF) 0.25 % IJ SOLN
INTRAMUSCULAR | Status: DC | PRN
  Administered 2013-05-03: 10 mL

## 2013-05-03 MED ORDER — PROMETHAZINE HCL 25 MG/ML IJ SOLN: 6.2500 mg | INTRAMUSCULAR | Status: DC | PRN

## 2013-05-03 MED ORDER — LIDOCAINE-EPINEPHRINE 1 %-1:100000 IJ SOLN
INTRAMUSCULAR | Status: DC | PRN
  Administered 2013-05-03: 10 mL

## 2013-05-03 MED ORDER — GLYCOPYRROLATE 0.2 MG/ML IJ SOLN
INTRAMUSCULAR | Status: DC | PRN
  Administered 2013-05-03: 0.6 mg via INTRAVENOUS

## 2013-05-03 MED ORDER — PHENOL 1.4 % MT LIQD: 1.0000 | OROMUCOSAL | Status: DC | PRN

## 2013-05-03 MED ORDER — LIDOCAINE HCL 2 % EX GEL
Freq: Once | CUTANEOUS | Status: DC
  Filled 2013-05-03: qty 5

## 2013-05-03 MED ORDER — CYCLOBENZAPRINE HCL 10 MG PO TABS
10.0000 mg | ORAL_TABLET | Freq: Three times a day (TID) | ORAL | Status: DC | PRN
  Administered 2013-05-03 – 2013-05-04 (×2): 10 mg via ORAL
  Filled 2013-05-03 (×2): qty 1

## 2013-05-03 MED ORDER — PANTOPRAZOLE SODIUM 40 MG PO TBEC: 40.0000 mg | DELAYED_RELEASE_TABLET | Freq: Every morning | ORAL | Status: DC

## 2013-05-03 MED ORDER — HYDROCODONE-ACETAMINOPHEN 5-325 MG PO TABS
1.0000 | ORAL_TABLET | Freq: Every day | ORAL | Status: DC | PRN
  Administered 2013-05-04: 1 via ORAL
  Filled 2013-05-03: qty 1

## 2013-05-03 MED ORDER — MIDAZOLAM HCL 5 MG/5ML IJ SOLN
INTRAMUSCULAR | Status: DC | PRN
  Administered 2013-05-03: 1 mg via INTRAVENOUS

## 2013-05-03 MED ORDER — LISINOPRIL 40 MG PO TABS
40.0000 mg | ORAL_TABLET | Freq: Two times a day (BID) | ORAL | Status: DC
  Administered 2013-05-03 (×2): 40 mg via ORAL
  Filled 2013-05-03 (×4): qty 1

## 2013-05-03 MED ORDER — HYDROMORPHONE HCL PF 1 MG/ML IJ SOLN: 0.5000 mg | INTRAMUSCULAR | Status: DC | PRN

## 2013-05-03 MED ORDER — HYDROMORPHONE HCL PF 1 MG/ML IJ SOLN
INTRAMUSCULAR | Status: AC
  Filled 2013-05-03: qty 1

## 2013-05-03 MED ORDER — CEFAZOLIN SODIUM 1-5 GM-% IV SOLN
1.0000 g | Freq: Three times a day (TID) | INTRAVENOUS | Status: AC
  Administered 2013-05-03 – 2013-05-04 (×2): 1 g via INTRAVENOUS
  Filled 2013-05-03 (×2): qty 50

## 2013-05-03 MED ORDER — BACITRACIN 50000 UNITS IM SOLR
INTRAMUSCULAR | Status: DC | PRN
  Administered 2013-05-03: 08:00:00

## 2013-05-03 MED ORDER — OXYCODONE-ACETAMINOPHEN 5-325 MG PO TABS
1.0000 | ORAL_TABLET | ORAL | Status: DC | PRN
  Administered 2013-05-03 – 2013-05-04 (×3): 2 via ORAL
  Filled 2013-05-03 (×3): qty 2

## 2013-05-03 MED ORDER — SODIUM CHLORIDE 0.9 % IV SOLN: 250.0000 mL | INTRAVENOUS | Status: DC

## 2013-05-03 MED ORDER — HEMOSTATIC AGENTS (NO CHARGE) OPTIME
TOPICAL | Status: DC | PRN
  Administered 2013-05-03: 1 via TOPICAL

## 2013-05-03 MED ORDER — MEPERIDINE HCL 25 MG/ML IJ SOLN: 6.2500 mg | INTRAMUSCULAR | Status: DC | PRN

## 2013-05-03 MED ORDER — MENTHOL 3 MG MT LOZG: 1.0000 | LOZENGE | OROMUCOSAL | Status: DC | PRN

## 2013-05-03 MED ORDER — ALPRAZOLAM 0.25 MG PO TABS
0.2500 mg | ORAL_TABLET | Freq: Every day | ORAL | Status: DC
  Filled 2013-05-03: qty 1

## 2013-05-03 MED ORDER — ACETAMINOPHEN 650 MG RE SUPP: 650.0000 mg | RECTAL | Status: DC | PRN

## 2013-05-03 MED ORDER — METOPROLOL TARTRATE 50 MG PO TABS
50.0000 mg | ORAL_TABLET | Freq: Two times a day (BID) | ORAL | Status: DC
  Administered 2013-05-03: 50 mg via ORAL
  Filled 2013-05-03 (×3): qty 1

## 2013-05-03 MED ORDER — SODIUM CHLORIDE 0.9 % IJ SOLN
3.0000 mL | Freq: Two times a day (BID) | INTRAMUSCULAR | Status: DC
  Administered 2013-05-03: 3 mL via INTRAVENOUS

## 2013-05-03 MED ORDER — OXYCODONE HCL 5 MG/5ML PO SOLN: 5.0000 mg | Freq: Once | ORAL | Status: DC | PRN

## 2013-05-03 MED ORDER — SODIUM CHLORIDE 0.9 % IJ SOLN: 3.0000 mL | INTRAMUSCULAR | Status: DC | PRN

## 2013-05-03 MED ORDER — TERAZOSIN HCL 1 MG PO CAPS
1.0000 mg | ORAL_CAPSULE | Freq: Every evening | ORAL | Status: DC
  Administered 2013-05-03: 1 mg via ORAL
  Filled 2013-05-03 (×2): qty 1

## 2013-05-03 MED ORDER — NEOSTIGMINE METHYLSULFATE 1 MG/ML IJ SOLN
INTRAMUSCULAR | Status: DC | PRN
  Administered 2013-05-03: 4 mg via INTRAVENOUS

## 2013-05-03 MED ORDER — ASPIRIN EC 325 MG PO TBEC
325.0000 mg | DELAYED_RELEASE_TABLET | Freq: Every day | ORAL | Status: DC
  Administered 2013-05-03: 325 mg via ORAL
  Filled 2013-05-03 (×3): qty 1

## 2013-05-03 MED ORDER — TAMSULOSIN HCL 0.4 MG PO CAPS
0.4000 mg | ORAL_CAPSULE | Freq: Every day | ORAL | Status: DC
  Administered 2013-05-03: 0.4 mg via ORAL
  Filled 2013-05-03 (×2): qty 1

## 2013-05-03 MED ORDER — EPHEDRINE SULFATE 50 MG/ML IJ SOLN
INTRAMUSCULAR | Status: DC | PRN
  Administered 2013-05-03: 10 mg via INTRAVENOUS
  Administered 2013-05-03: 5 mg via INTRAVENOUS

## 2013-05-03 MED ORDER — LACTATED RINGERS IV SOLN
INTRAVENOUS | Status: DC | PRN
  Administered 2013-05-03 (×2): via INTRAVENOUS

## 2013-05-03 MED ORDER — NITROGLYCERIN 0.4 MG SL SUBL
0.4000 mg | SUBLINGUAL_TABLET | SUBLINGUAL | Status: DC | PRN
Start: 2013-05-03 — End: 2013-05-04

## 2013-05-03 MED ORDER — LIDOCAINE HCL (CARDIAC) 20 MG/ML IV SOLN
INTRAVENOUS | Status: DC | PRN
  Administered 2013-05-03: 60 mg via INTRAVENOUS

## 2013-05-03 MED ORDER — TEMAZEPAM 15 MG PO CAPS: 15.0000 mg | ORAL_CAPSULE | Freq: Every day | ORAL | Status: DC

## 2013-05-03 SURGICAL SUPPLY — 51 items
ADH SKN CLS APL DERMABOND .7 (GAUZE/BANDAGES/DRESSINGS) ×1
APL SKNCLS STERI-STRIP NONHPOA (GAUZE/BANDAGES/DRESSINGS) ×1
BAG DECANTER FOR FLEXI CONT (MISCELLANEOUS) ×2 IMPLANT
BENZOIN TINCTURE PRP APPL 2/3 (GAUZE/BANDAGES/DRESSINGS) ×2 IMPLANT
BLADE SURG 11 STRL SS (BLADE) ×2 IMPLANT
BLADE SURG ROTATE 9660 (MISCELLANEOUS) IMPLANT
BRUSH SCRUB EZ PLAIN DRY (MISCELLANEOUS) ×2 IMPLANT
BUR MATCHSTICK NEURO 3.0 LAGG (BURR) ×2 IMPLANT
BUR PRECISION FLUTE 6.0 (BURR) ×2 IMPLANT
CANISTER SUCT 3000ML (MISCELLANEOUS) ×2 IMPLANT
CONT SPEC 4OZ CLIKSEAL STRL BL (MISCELLANEOUS) ×2 IMPLANT
DECANTER SPIKE VIAL GLASS SM (MISCELLANEOUS) ×2 IMPLANT
DERMABOND ADVANCED (GAUZE/BANDAGES/DRESSINGS) ×1
DERMABOND ADVANCED .7 DNX12 (GAUZE/BANDAGES/DRESSINGS) ×1 IMPLANT
DRAPE LAPAROTOMY 100X72X124 (DRAPES) ×2 IMPLANT
DRAPE MICROSCOPE ZEISS OPMI (DRAPES) ×2 IMPLANT
DRAPE POUCH INSTRU U-SHP 10X18 (DRAPES) ×2 IMPLANT
DRAPE PROXIMA HALF (DRAPES) IMPLANT
DRAPE SURG 17X23 STRL (DRAPES) ×2 IMPLANT
DRSG OPSITE 4X5.5 SM (GAUZE/BANDAGES/DRESSINGS) ×2 IMPLANT
DURAPREP 26ML APPLICATOR (WOUND CARE) ×2 IMPLANT
ELECT REM PT RETURN 9FT ADLT (ELECTROSURGICAL) ×2
ELECTRODE REM PT RTRN 9FT ADLT (ELECTROSURGICAL) ×1 IMPLANT
GAUZE SPONGE 4X4 16PLY XRAY LF (GAUZE/BANDAGES/DRESSINGS) IMPLANT
GLOVE BIO SURGEON STRL SZ8 (GLOVE) ×2 IMPLANT
GLOVE EXAM NITRILE LRG STRL (GLOVE) IMPLANT
GLOVE EXAM NITRILE MD LF STRL (GLOVE) IMPLANT
GLOVE EXAM NITRILE XL STR (GLOVE) IMPLANT
GLOVE EXAM NITRILE XS STR PU (GLOVE) IMPLANT
GLOVE INDICATOR 8.5 STRL (GLOVE) ×2 IMPLANT
GOWN BRE IMP SLV AUR LG STRL (GOWN DISPOSABLE) ×2 IMPLANT
GOWN BRE IMP SLV AUR XL STRL (GOWN DISPOSABLE) ×4 IMPLANT
GOWN STRL REIN 2XL LVL4 (GOWN DISPOSABLE) IMPLANT
KIT BASIN OR (CUSTOM PROCEDURE TRAY) ×2 IMPLANT
KIT ROOM TURNOVER OR (KITS) ×2 IMPLANT
NEEDLE HYPO 22GX1.5 SAFETY (NEEDLE) ×2 IMPLANT
NEEDLE SPNL 22GX3.5 QUINCKE BK (NEEDLE) ×2 IMPLANT
NS IRRIG 1000ML POUR BTL (IV SOLUTION) ×2 IMPLANT
PACK LAMINECTOMY NEURO (CUSTOM PROCEDURE TRAY) ×2 IMPLANT
RUBBERBAND STERILE (MISCELLANEOUS) ×4 IMPLANT
SPONGE GAUZE 4X4 12PLY (GAUZE/BANDAGES/DRESSINGS) IMPLANT
SPONGE SURGIFOAM ABS GEL SZ50 (HEMOSTASIS) ×2 IMPLANT
STRIP CLOSURE SKIN 1/2X4 (GAUZE/BANDAGES/DRESSINGS) ×2 IMPLANT
SUT VIC AB 0 CT1 18XCR BRD8 (SUTURE) ×1 IMPLANT
SUT VIC AB 0 CT1 8-18 (SUTURE) ×2
SUT VIC AB 2-0 CT1 18 (SUTURE) ×2 IMPLANT
SUT VICRYL 4-0 PS2 18IN ABS (SUTURE) ×2 IMPLANT
SYR 20ML ECCENTRIC (SYRINGE) ×2 IMPLANT
TOWEL OR 17X24 6PK STRL BLUE (TOWEL DISPOSABLE) ×2 IMPLANT
TOWEL OR 17X26 10 PK STRL BLUE (TOWEL DISPOSABLE) ×2 IMPLANT
WATER STERILE IRR 1000ML POUR (IV SOLUTION) ×2 IMPLANT

## 2013-05-03 NOTE — Anesthesia Postprocedure Evaluation (Signed)
  Anesthesia Post-op Note  Patient: Darryl [name]  Procedure(s) Performed: Procedure(s): LUMBAR LAMINECTOMY/DCOMPRESSION MICRODISCECTOMY RIGHT LUMBAR FOUR FIVE (N/A)  Patient Location: PACU  Anesthesia Type:General  Level of Consciousness: sedated, patient cooperative and responds to stimulation and voice  Airway and Oxygen Therapy: Patient Spontanous Breathing and Patient connected to nasal cannula oxygen  Post-op Pain: mild  Post-op Assessment: Post-op Vital signs reviewed, Patient's Cardiovascular Status Stable, Respiratory Function Stable, Patent Airway, No signs of Nausea or vomiting and Pain level controlled  Post-op Vital Signs: Reviewed and stable  Complications: No apparent anesthesia complications

## 2013-05-03 NOTE — Op Note (Signed)
Preoperative diagnosis: Right L4 radiculopathy from herniated nucleus pulposis extraforaminal he L4-5 right  Postoperative diagnosis: Same  Procedure: Extra foraminal discectomy right L4-5 with microdissection of the right L4 nerve root microscopic discectomy  Surgeon: Jillyn Hidden Zaiah Credeur  Anesthesia: Gen.  EBL: Minimal  History of present illness: Patient is a very pleasant 78 year old gentleman is a progress worsening back and right hip and leg pain rating down at L4 nerve root pattern workup revealed a large extra foraminal disc initial stenosis on right L4 patient's failed all forms of conservative treatment was recommended extraforaminal discectomy and decompression on the right at L4-5 extensively went over the risks and benefits of the operation the patient as well as perioperative course and expectations of outcome of her surgery he understood and agreed to proceed forward.  Operative procedure: Patient brought into the or was induced under general anesthesia positioned prone the Wilson frame his back was prepped and draped in routine sterile fashion preoperative x-ray localize the appropriate levels after infiltration of 10 cc lidocaine with epi a midline incision is made over the eye was taken subcutaneous tissue subperiosteal dissection carried lamina of L4 and L5 on the right the TPS of L4 was identified with intraoperative x-ray. Then the lateral pars superior aspect L4-67facet and inferior aspect of the L34  facet was drilled down a high-speed drill. The plane between the undersurface of the lateral pars was developed from the intertransverse ligament with a 4 Penfield and then the undersurface the lateral pars was aggressively under been with a 2 mm Kerrison punch and inner transverse ligament was removed exposing the L4 nerve root. The L4 nerve was then freed up of a large disc herniations still partially contained with a ligament this this was then incised lumbar scalpel and pituitary rongeurs  used to clean out the disc space with a large fragment removed my the facet complex. At the discectomy there is no further stenosis of the L4 nerve root the foramen was widely patent easily excepting a coronary dilator both proximally and distally. Was in to proceed irrigated meticulous hemostasis was maintained Gelfoam was overlaid top of the dura and the muscle fascia pressure layers with after Vicryl the skin was closed with a running 4 subcuticular. Benzoin and Steri-Strips were applied patient recovered in stable condition. At the end of case on it counts sponge counts were correct.

## 2013-05-03 NOTE — Preoperative (Signed)
Beta Blockers   Reason not to administer Beta Blockers:Not Applicable 

## 2013-05-03 NOTE — Progress Notes (Signed)
Pt unable to void, bladder scan >867mL. I&O cath attempted with no success, continued to meet resistance. Coude Cath placed. Will continue to monitor Pt. Rema Fendt, RN

## 2013-05-03 NOTE — H&P (Signed)
Darryl [name] is an 78 y.o. male.   Chief Complaint: Back and right leg pain HPI: Patient is a very pleasant 78 year old some is a progress worsening pain would radiate from the right side his back or his hip down the outside of his right thigh down below his knee the front of his shin consistent with an L4 nerve root pattern. Patient is been refractory to all forms of conservative treatment imaging findings showed a progression of this disc disease over 2000 and 2014 with foraminal compression of the L4 nerve root of the right with a broad-based disc bulge and severe stenosis with displacement of the L4 nerve. Due to patient's failure conservative treatment progression of clinical syndrome and imaging findings have recommended extraforaminal discectomy and decompression of the L4 nerve root on the right I extensively reviewed the risks and benefits of the operation with the patient as well as perioperative course expectations about alternatives of surgery and he understood and agreed to proceed forward.  Past Medical History  Diagnosis Date  . Hypercholesteremia   . LBBB (left bundle branch block)   . Sleep disturbance   . Insomnia   . Tremor   . HTN (hypertension)   . Dyslipidemia   . Thrombocytopenia   . Colon polyp   . Diverticulosis   . Renal insufficiency     renal duplex scan in 2012 showed no renal artery stenosis  . Coronary atherosclerosis of native coronary artery     ASCAD s/p CABG 6/200 a5, s/p PCI of the SVG to PDA and radial artery graft to OM-2 11/05, PCI left circumflex spring of 2010,   . Gastric ulcer   . Lumbar spondylolysis     multilevel DJD with mild spinal stenosis L3-L5  . Headache(784.0)   . Arthritis     Past Surgical History  Procedure Laterality Date  . Wisdom tooth extraction    . Esophagogastroduodenoscopy    . Coronary artery bypass graft  2005  . Shoulder arthroscopy Left 9/10  . Appendectomy  62  . Stones      hx  . Coronary angioplasty   6/10,10/05  . Cholecystectomy  89    Family History  Problem Relation Age of Onset  . CAD     Social History:  reports that he quit smoking about 54 years ago. His smoking use included Cigarettes. He has a 10 pack-year smoking history. He does not have any smokeless tobacco history on file. He reports that he does not drink alcohol or use illicit drugs.  Allergies:  Allergies  Allergen Reactions  . Aspirin Other (See Comments)    ulcers  . Altace [Ramipril] Other (See Comments)    REACTION: unknown  . Biaxin [Clarithromycin] Nausea Only  . Celebrex [Celecoxib] Other (See Comments)    Due to ulcer.  . Nsaids Other (See Comments)    Due to ulcer.  . Tetracyclines & Related Nausea Only  . Toprol Xl [Metoprolol Tartrate] Swelling    Medications Prior to Admission  Medication Sig Dispense Refill  . ALPRAZolam (XANAX) 0.25 MG tablet Take 0.25 mg by mouth daily.      [name] aspirin EC 325 MG tablet Take 325 mg by mouth daily.      . cloNIDine (CATAPRES) 0.2 MG tablet Take 0.2 mg by mouth 2 (two) times daily.      [name] HYDROcodone-acetaminophen (NORCO/VICODIN) 5-325 MG per tablet Take 1 tablet by mouth daily as needed for pain.      [name] irbesartan (AVAPRO)  300 MG tablet Take 300 mg by mouth every morning.      [name] lisinopril (PRINIVIL,ZESTRIL) 20 MG tablet Take 40 mg by mouth 2 (two) times daily.      . metoprolol tartrate (LOPRESSOR) 25 MG tablet Take 50 mg by mouth 2 (two) times daily.      . Omega-3 Fatty Acids (FISH OIL PO) Take 6 g by mouth daily. Carlson's Liquid      . pantoprazole (PROTONIX) 40 MG tablet Take 40 mg by mouth every morning.      . temazepam (RESTORIL) 15 MG capsule Take 15 mg by mouth daily.      [name] terazosin (HYTRIN) 1 MG capsule Take 1 mg by mouth every evening.      . traMADol (ULTRAM) 50 MG tablet Take 50 mg by mouth every 6 (six) hours as needed for pain.      . nitroGLYCERIN (NITROSTAT) 0.4 MG SL tablet Place 0.4 mg under the tongue every 5 (five) minutes as needed for  chest pain.        No results found for this or any previous visit (from the past 48 hour(s)). No results found.  Review of Systems  Constitutional: Negative.   HENT: Negative.   Eyes: Negative.   Respiratory: Negative.   Cardiovascular: Negative.   Gastrointestinal: Negative.   Genitourinary: Negative.   Musculoskeletal: Positive for back pain, joint pain and myalgias.  Skin: Negative.   Neurological: Positive for tingling.  Endo/Heme/Allergies: Negative.   Psychiatric/Behavioral: Negative.     Blood pressure 178/96, pulse 54, temperature 97.7 F (36.5 C), temperature source Oral, resp. rate 16, SpO2 99.00%. Physical Exam  Constitutional: He is oriented to person, place, and time. He appears well-developed and well-nourished.  HENT:  Head: Normocephalic.  Eyes: Pupils are equal, round, and reactive to light.  Neck: Normal range of motion.  Respiratory: Effort normal.  GI: Soft.  Neurological: He is alert and oriented to person, place, and time. He has normal strength. GCS eye subscore is 4. GCS verbal subscore is 5. GCS motor subscore is 6.  Reflex Scores:      Patellar reflexes are 0 on the right side and 0 on the left side.      Achilles reflexes are 0 on the right side and 0 on the left side. Strength is 5 out of 5 in his iliopsoas, quads, hamstrings, gastrocs, anterior tibialis, and EHL.  Skin: Skin is warm and dry.     Assessment/Plan 78 year old presents for right-sided L4-5 extraforaminal decompression discectomy  Kierah Goatley P 05/03/2013, 8:01 AM

## 2013-05-03 NOTE — Transfer of Care (Signed)
Immediate Anesthesia Transfer of Care Note  Patient: Darryl [name]  Procedure(s) Performed: Procedure(s): LUMBAR LAMINECTOMY/DCOMPRESSION MICRODISCECTOMY RIGHT LUMBAR FOUR FIVE (N/A)  Patient Location: PACU  Anesthesia Type:General  Level of Consciousness: awake, alert  and oriented  Airway & Oxygen Therapy: Patient Spontanous Breathing and Patient connected to face mask oxygen  Post-op Assessment: Report given to PACU RN  Post vital signs: Reviewed and stable  Complications: No apparent anesthesia complications

## 2013-05-03 NOTE — Anesthesia Preprocedure Evaluation (Addendum)
Anesthesia Evaluation  Patient identified by MRN, date of birth, ID band Patient awake    Reviewed: Allergy & Precautions, H&P , NPO status , Patient's Chart, lab work & pertinent test results, reviewed documented beta blocker date and time   History of Anesthesia Complications Negative for: history of anesthetic complications  Airway Mallampati: II TM Distance: >3 FB Neck ROM: Full    Dental  (+) Teeth Intact and Dental Advisory Given   Pulmonary neg pulmonary ROS, former smoker,    Pulmonary exam normal       Cardiovascular hypertension, Pt. on medications + CAD, + Cardiac Stents (Cx stent '10) and + CABG ('05) Rhythm:Regular Rate:Bradycardia  10/14 stress test: EF 58%, no ischemia   Neuro/Psych  Headaches, Anxiety    GI/Hepatic negative GI ROS, Neg liver ROS,   Endo/Other  negative endocrine ROS  Renal/GU Renal InsufficiencyRenal disease (creat 1.34)     Musculoskeletal   Abdominal Normal abdominal exam  (+) + obese,   Peds  Hematology   Anesthesia Other Findings   Reproductive/Obstetrics                          Anesthesia Physical Anesthesia Plan  ASA: III  Anesthesia Plan: General   Post-op Pain Management:    Induction: Intravenous  Airway Management Planned: Oral ETT  Additional Equipment:   Intra-op Plan:   Post-operative Plan: Extubation in OR  Informed Consent: I have reviewed the patients History and Physical, chart, labs and discussed the procedure including the risks, benefits and alternatives for the proposed anesthesia with the patient or authorized representative who has indicated his/her understanding and acceptance.   Dental advisory given  Plan Discussed with: CRNA, Anesthesiologist and Surgeon  Anesthesia Plan Comments: (Plan routine monitors, GETA)       Anesthesia Quick Evaluation

## 2013-05-04 MED ORDER — TAMSULOSIN HCL 0.4 MG PO CAPS: 0.4000 mg | ORAL_CAPSULE | Freq: Every day | ORAL | Status: DC

## 2013-05-04 MED ORDER — HYDROCODONE-ACETAMINOPHEN 5-325 MG PO TABS: 1.0000 | ORAL_TABLET | Freq: Every day | ORAL | Status: DC | PRN

## 2013-05-04 NOTE — Progress Notes (Signed)
Patient ID: Darryl [name], male   DOB: 1934-10-02, 77 y.o.   MRN: [id number] Doing well history of voiding difficulty will discharge her with the catheter in place the catheter care and arranged followup with urology otherwise his strength is 5 out of 5 his wound is clean and dry and is ready for discharge

## 2013-05-04 NOTE — Progress Notes (Signed)
Pt. Alert and oriented,follows simple instructions, denies pain. Incision area without swelling, redness or S/S of infection. Voiding adequate clear yellow urine. Moving all extremities well and vitals stable and documented. Patient discharged home with spouse.  Lumbar surgery notes instructions given to patient and family member for home safety and precautions. Pt. and family stated understanding of instructions given. Pain medication given to patient prior to discharged. 

## 2013-05-04 NOTE — Discharge Summary (Signed)
  Physician Discharge Summary  Patient ID: Darryl [name] MRN: [id number] DOB/AGE: 09/20/1934 78 y.o.  Admit date: 05/03/2013 Discharge date: 05/04/2013  Admission Diagnoses: Right L4 radiculopathy from herniated disc L4-5 extraforaminal  Discharge Diagnoses:  Active Problems:   * No active hospital problems. *   Discharged Condition: good  Hospital Course: Patient admitted hospital underwent a right L4-5 extraforaminal discectomy postop patient did very well had some initial difficulty voiding a catheter replaced has some chronic difficulty with this will discharge her with catheter placed each catheter care and arranged followup with urology. Patient is stable for discharge home.  Consults: Significant Diagnostic Studies Treatments: Right L4-5 extraforaminal discectomy Discharge Exam: Blood pressure 145/93, pulse 69, temperature 99.4 F (37.4 C), temperature source Oral, resp. rate 20, SpO2 94.00%. Strength out of 5 wound clean and dry  Disposition: Home     Medication List         ALPRAZolam 0.25 MG tablet  Commonly known as:  XANAX  Take 0.25 mg by mouth daily.     aspirin EC 325 MG tablet  Take 325 mg by mouth daily.     cloNIDine 0.2 MG tablet  Commonly known as:  CATAPRES  Take 0.2 mg by mouth 2 (two) times daily.     FISH OIL PO  Take 6 g by mouth daily. Carlson's Liquid     HYDROcodone-acetaminophen 5-325 MG per tablet  Commonly known as:  NORCO/VICODIN  Take 1 tablet by mouth daily as needed for moderate pain.     HYDROcodone-acetaminophen 5-325 MG per tablet  Commonly known as:  NORCO/VICODIN  Take 1 tablet by mouth daily as needed for pain.     irbesartan 300 MG tablet  Commonly known as:  AVAPRO  Take 300 mg by mouth every morning.     lisinopril 20 MG tablet  Commonly known as:  PRINIVIL,ZESTRIL  Take 40 mg by mouth 2 (two) times daily.     metoprolol tartrate 25 MG tablet  Commonly known as:  LOPRESSOR  Take 50 mg by mouth 2 (two)  times daily.     nitroGLYCERIN 0.4 MG SL tablet  Commonly known as:  NITROSTAT  Place 0.4 mg under the tongue every 5 (five) minutes as needed for chest pain.     pantoprazole 40 MG tablet  Commonly known as:  PROTONIX  Take 40 mg by mouth every morning.     temazepam 15 MG capsule  Commonly known as:  RESTORIL  Take 15 mg by mouth daily.     terazosin 1 MG capsule  Commonly known as:  HYTRIN  Take 1 mg by mouth every evening.     traMADol 50 MG tablet  Commonly known as:  ULTRAM  Take 50 mg by mouth every 6 (six) hours as needed for pain.           Follow-up Information   Follow up with [facility], MD.   Specialty:  Neurosurgery   Contact information:   [address] [place] [address] [phone]       Signed: Draden Cottingham P 05/04/2013, 7:18 AM

## 2013-05-05 ENCOUNTER — Encounter (HOSPITAL_COMMUNITY): Payer: Self-pay | Admitting: Neurosurgery

## 2013-05-08 DIAGNOSIS — N402 Nodular prostate without lower urinary tract symptoms: Secondary | ICD-10-CM | POA: Diagnosis not present

## 2013-05-08 DIAGNOSIS — N401 Enlarged prostate with lower urinary tract symptoms: Secondary | ICD-10-CM | POA: Diagnosis not present

## 2013-05-08 DIAGNOSIS — N139 Obstructive and reflux uropathy, unspecified: Secondary | ICD-10-CM | POA: Diagnosis not present

## 2013-05-08 DIAGNOSIS — R339 Retention of urine, unspecified: Secondary | ICD-10-CM | POA: Diagnosis not present

## 2013-05-16 DIAGNOSIS — N402 Nodular prostate without lower urinary tract symptoms: Secondary | ICD-10-CM | POA: Diagnosis not present

## 2013-05-16 DIAGNOSIS — R339 Retention of urine, unspecified: Secondary | ICD-10-CM | POA: Diagnosis not present

## 2013-05-16 DIAGNOSIS — N139 Obstructive and reflux uropathy, unspecified: Secondary | ICD-10-CM | POA: Diagnosis not present

## 2013-05-16 DIAGNOSIS — N401 Enlarged prostate with lower urinary tract symptoms: Secondary | ICD-10-CM | POA: Diagnosis not present

## 2013-06-13 ENCOUNTER — Other Ambulatory Visit: Payer: Self-pay | Admitting: Neurosurgery

## 2013-06-13 DIAGNOSIS — M545 Low back pain, unspecified: Secondary | ICD-10-CM

## 2013-06-21 ENCOUNTER — Ambulatory Visit
Admission: RE | Admit: 2013-06-21 | Discharge: 2013-06-21 | Disposition: A | Discharge status: Home / Self Care | Payer: Medicare Other | Source: Ambulatory Visit | Attending: Neurosurgery | Admitting: Neurosurgery

## 2013-06-21 DIAGNOSIS — M545 Low back pain: Secondary | ICD-10-CM | POA: Diagnosis not present

## 2013-06-21 MED ORDER — GADOBENATE DIMEGLUMINE 529 MG/ML IV SOLN
19.0000 mL | Freq: Once | INTRAVENOUS | Status: AC | PRN
  Administered 2013-06-21: 19 mL via INTRAVENOUS

## 2013-06-27 ENCOUNTER — Other Ambulatory Visit: Payer: Self-pay | Admitting: Neurosurgery

## 2013-06-27 DIAGNOSIS — M545 Low back pain: Secondary | ICD-10-CM

## 2013-07-03 ENCOUNTER — Ambulatory Visit
Admission: RE | Admit: 2013-07-03 | Discharge: 2013-07-03 | Disposition: A | Discharge status: Home / Self Care | Payer: Medicare Other | Source: Ambulatory Visit | Attending: Neurosurgery | Admitting: Neurosurgery

## 2013-07-03 DIAGNOSIS — M545 Low back pain, unspecified: Secondary | ICD-10-CM | POA: Diagnosis not present

## 2013-07-03 MED ORDER — IOHEXOL 180 MG/ML  SOLN
1.0000 mL | Freq: Once | INTRAMUSCULAR | Status: AC | PRN
  Administered 2013-07-03: 1 mL via EPIDURAL

## 2013-07-03 MED ORDER — METHYLPREDNISOLONE ACETATE 40 MG/ML INJ SUSP (RADIOLOG
120.0000 mg | Freq: Once | INTRAMUSCULAR | Status: AC
  Administered 2013-07-03: 120 mg via EPIDURAL

## 2013-07-03 NOTE — Discharge Instructions (Signed)

## 2013-07-04 ENCOUNTER — Other Ambulatory Visit: Payer: Medicare Other

## 2013-07-26 DIAGNOSIS — Z0000 Encounter for general adult medical examination without abnormal findings: Secondary | ICD-10-CM | POA: Diagnosis not present

## 2013-07-26 DIAGNOSIS — D696 Thrombocytopenia, unspecified: Secondary | ICD-10-CM | POA: Diagnosis not present

## 2013-07-26 DIAGNOSIS — E782 Mixed hyperlipidemia: Secondary | ICD-10-CM | POA: Diagnosis not present

## 2013-07-26 DIAGNOSIS — I447 Left bundle-branch block, unspecified: Secondary | ICD-10-CM | POA: Diagnosis not present

## 2013-07-26 DIAGNOSIS — Z23 Encounter for immunization: Secondary | ICD-10-CM | POA: Diagnosis not present

## 2013-07-26 DIAGNOSIS — N402 Nodular prostate without lower urinary tract symptoms: Secondary | ICD-10-CM | POA: Diagnosis not present

## 2013-07-26 DIAGNOSIS — R259 Unspecified abnormal involuntary movements: Secondary | ICD-10-CM | POA: Diagnosis not present

## 2013-07-26 DIAGNOSIS — I10 Essential (primary) hypertension: Secondary | ICD-10-CM | POA: Diagnosis not present

## 2013-07-26 DIAGNOSIS — R7301 Impaired fasting glucose: Secondary | ICD-10-CM | POA: Diagnosis not present

## 2013-08-11 ENCOUNTER — Encounter: Payer: Self-pay | Admitting: Neurology

## 2013-08-14 ENCOUNTER — Encounter (INDEPENDENT_AMBULATORY_CARE_PROVIDER_SITE_OTHER): Payer: Self-pay

## 2013-08-14 ENCOUNTER — Encounter: Payer: Self-pay | Admitting: Neurology

## 2013-08-14 ENCOUNTER — Ambulatory Visit (INDEPENDENT_AMBULATORY_CARE_PROVIDER_SITE_OTHER): Payer: Medicare Other | Admitting: Neurology

## 2013-08-14 VITALS — BP 152/102 | HR 64 | Ht 69.5 in | Wt 209.0 lb

## 2013-08-14 DIAGNOSIS — R259 Unspecified abnormal involuntary movements: Secondary | ICD-10-CM

## 2013-08-14 DIAGNOSIS — R251 Tremor, unspecified: Secondary | ICD-10-CM

## 2013-08-14 NOTE — Patient Instructions (Signed)
Overall you are doing fairly well but I do want to suggest a few things today:   Remember to drink plenty of fluid, eat healthy meals and do not skip any meals. Try to eat protein with a every meal and eat a healthy snack such as fruit or nuts in between meals. Try to keep a regular sleep-wake schedule and try to exercise daily, particularly in the form of walking, 20-30 minutes a day, if you can.   As far as diagnostic testing:  1)We will order a brain MRI. Someone should call you within the next few days to schedule this  We will hold off on starting any medication at this time. Please try to exercise on a regular basis, this is a great way to slow down the progression of your symptoms.   I would like to see you back in 4 months, sooner if we need to. Please call us with any interim questions, concerns, problems, updates or refill requests.   My clinical assistant and will answer any of your questions and relay your messages to me and also relay most of my messages to you.   Our phone number is [phone]. We also have an after hours call service for urgent matters and there is a physician on-call for urgent questions. For any emergencies you know to call 911 or go to the nearest emergency room

## 2013-08-14 NOTE — Progress Notes (Signed)
[facility]    Provider:  Dr Janann Colonel Referring Provider: Hulan Fess, MD Primary Care Physician:  Gennette Pac, MD  CC:  tremor  HPI:  Darryl [name] is a 79 y.o. male here as a referral from Dr. Rex Kras for tremor evaluation  First noticed tremor around 3 years ago, started on right but now involves both hands but is R>L. Described as both a rest and action/postural tremor. Bothers him the most when trying to eat. Handwriting has gotten messier, no change in size. Wife notes some generalized slowing down, some shuffling of his feet. No muscle stiffness or cramps. He does not some gait instability, has some trouble getting in and out of the car. Feels very unsteady going down stairs. Has not noticed anything that makes the tremor worse. No intake of caffeine or EtOH. Sleeps well, wife notes some REM behavior disorder (talking in his sleep). No known difficulty with sense of smell. No known stroke or TIA, has had CAD with CABG x 4.   He thinks his father may have had a tremor.    Review of Systems: Out of a complete 14 system review, the patient complains of only the following symptoms, and all other reviewed systems are negative. Positive fatigue easy bruising headache weakness tremor sleepiness restless legs anxiety decreased energy ringing in ears  History   Social History  . Marital Status: Married    Spouse Name: [name]    Number of Children: 4  . Years of Education: college   Occupational History  . Not on file.   Social History Main Topics  . Smoking status: Former Smoker -- 2.00 packs/day for 5 years    Types: Cigarettes    Quit date: 04/28/1959  . Smokeless tobacco: Never Used     Comment: 55+ yrs ago  . Alcohol Use: No  . Drug Use: No  . Sexual Activity: Not on file   Other Topics Concern  . Not on file   Social History Narrative   Patient is married to [name]), has 4 children   Patient is right handed   Education level is  college   Caffeine consumption is none    Family History  Problem Relation Age of Onset  . CAD Father 38    ASCAD  . CAD Sister     Past Medical History  Diagnosis Date  . Hypercholesteremia   . LBBB (left bundle branch block)   . Sleep disturbance   . Insomnia   . Tremor   . HTN (hypertension)     orthostatic  . Dyslipidemia   . Thrombocytopenia   . Colon polyp   . Diverticulosis   . Renal insufficiency     renal duplex scan in 2012 showed no renal artery stenosis  . Coronary atherosclerosis of native coronary artery     ASCAD s/p CABG 6/200 a5, s/p PCI of the SVG to PDA and radial artery graft to OM-2 11/05, PCI left circumflex spring of 2010,   . Gastric ulcer   . Lumbar spondylolysis     multilevel DJD with mild spinal stenosis L3-L5  . Headache(784.0)   . Arthritis   . Thrombocytopenia   . Elevated fasting glucose   . Gastritis   . Hiatal hernia   . Migraines   . H/O urinary retention     post surgical procedures    Past Surgical History  Procedure Laterality Date  . Wisdom tooth extraction    . Esophagogastroduodenoscopy  10/2001, 12/2003, 04/2004  . Coronary artery bypass graft  2005  . Shoulder arthroscopy Left 9/10  . Appendectomy  62    1963  . Stones      hx  . Coronary angioplasty  6/10,10/05  . Cholecystectomy  1998  . Lumbar laminectomy/decompression microdiscectomy N/A 05/03/2013    Procedure: LUMBAR LAMINECTOMY/DCOMPRESSION MICRODISCECTOMY RIGHT LUMBAR FOUR FIVE;  Surgeon: [name], MD;  Location: [facility];  Service: Neurosurgery;  Laterality: N/A;  . Colonoscopy  10/2001  . Colon surgery  03/14/2008    Current Outpatient Prescriptions  Medication Sig Dispense Refill  . ALPRAZolam (XANAX) 0.25 MG tablet Take 0.25 mg by mouth daily.      [name] aspirin EC 325 MG tablet Take 325 mg by mouth daily.      . cloNIDine (CATAPRES) 0.2 MG tablet Take 0.2 mg by mouth 2 (two) times daily.      [name] HYDROcodone-acetaminophen (NORCO/VICODIN)  5-325 MG per tablet Take 1 tablet by mouth daily as needed for pain.      [name] HYDROcodone-acetaminophen (NORCO/VICODIN) 5-325 MG per tablet Take 1 tablet by mouth daily as needed for moderate pain.  80 tablet  0  . irbesartan (AVAPRO) 300 MG tablet Take 300 mg by mouth every morning.      [name] lisinopril (PRINIVIL,ZESTRIL) 20 MG tablet Take 40 mg by mouth 2 (two) times daily.      . metoprolol tartrate (LOPRESSOR) 25 MG tablet Take 50 mg by mouth 2 (two) times daily.      . nitroGLYCERIN (NITROSTAT) 0.4 MG SL tablet Place 0.4 mg under the tongue every 5 (five) minutes as needed for chest pain.      . Omega-3 Fatty Acids (FISH OIL PO) Take 6 g by mouth daily. Carlson's Liquid      . pantoprazole (PROTONIX) 40 MG tablet Take 40 mg by mouth every morning.      . tamsulosin (FLOMAX) 0.4 MG CAPS capsule Take 1 capsule (0.4 mg total) by mouth daily.  40 capsule  0  . temazepam (RESTORIL) 15 MG capsule Take 15 mg by mouth daily.      [name] terazosin (HYTRIN) 1 MG capsule Take 1 mg by mouth every evening.      . traMADol (ULTRAM) 50 MG tablet Take 50 mg by mouth every 6 (six) hours as needed for pain.       No current facility-administered medications for this visit.    Allergies as of 08/14/2013 - Review Complete 08/14/2013  Allergen Reaction Noted  . Aspirin Other (See Comments) 04/27/2013  . Celebrex [celecoxib] Other (See Comments) 04/10/2013  . Nsaids Other (See Comments) 04/10/2013  . Toprol xl [metoprolol tartrate] Swelling 04/10/2013  . Altace [ramipril]  04/10/2013  . Levothyroxine Other (See Comments) 08/11/2013  . Biaxin [clarithromycin] Nausea Only 04/10/2013  . Tetracyclines & related Nausea Only 04/10/2013    Vitals: BP 152/102  Pulse 64  Ht 5' 9.5" (1.765 m)  Wt 209 lb (94.802 kg)  BMI 30.43 kg/m2 Last Weight:  Wt Readings from Last 1 Encounters:  08/14/13 209 lb (94.802 kg)   Last Height:   Ht Readings from Last 1 Encounters:  08/14/13 5' 9.5" (1.765 m)     Physical  exam: Exam: Gen: NAD, conversant Eyes: anicteric sclerae, moist conjunctivae, minimally decreased blink rate HENT: Atraumatic, oropharynx clear Neck: Trachea midline; supple,  Lungs: CTA, no wheezing, rales, rhonic  CV: RRR, no MRG Abdomen: Soft, non-tender;  Extremities: No peripheral edema  Skin: Normal temperature, no rash,  Psych: Appropriate affect, pleasant  Neuro: MS: AA&Ox3, appropriately interactive, normal affect   Speech: fluent w/o paraphasic error  Memory: good recent and remote recall  CN: PERRL, EOMI no nystagmus, no ptosis, sensation intact to LT V1-V3 bilat, face symmetric, no weakness, hearing grossly intact, palate elevates symmetrically, shoulder shrug 5/5 bilat,  tongue protrudes midline, no fasiculations noted.  Motor: Mild cogwheeling rigidity bilateral wrists Strength: 5/5  In all extremities  Coord: Rest tremor noted right hand greater than left, bilateral intention and postural tremor. Bradykinesia noted with finger tapping left greater than right, hand opening closing left greater than right and rapid alternating movement. Bradykinesia with tapping of left foot, right within normal limits. Patient's writing sample notable for normal size a legible signature, large irregular Archimedes spiral   Reflexes: symmetrical, bilat downgoing toes  Sens: LT intact in all extremities  Gait: With arms crossed able to stand slowly but without assistance, decreased arm swing on the left, mild shuffling of steps, re emergent tremor in the right, Romberg negative, multiple steps back with pull test    Assessment:  After physical and neurologic examination, review of laboratory studies, imaging, neurophysiology testing and pre-existing records, assessment will be reviewed on the problem list.  Plan:  Treatment plan and additional workup will be reviewed under Problem List.  110)Tremor  79 year old gentleman presenting for initial evaluation of  tremor, which is been ongoing for multiple years and is getting progressively worse. He describes both a rest and action component, he feels action component is causing the most difficulty. History also notable for some slowing down of walking, shuffling steps, generalized bradykinesia. Physical exam is pertinent for bilateral rest tremor, action tremor, postural tremor, bradykinesia, some cogwheeling rigidity, shuffling steps with decreased armswing. Patient has clinical features consistent with both a parkinsonism and an essentiatremor, but overall based on history and exam suspect this likely is a parkinsonism. Based on age, and overall slow progression this likely represent idiopathic Parkinson's disease. Discussed this diagnosis with patient and his wife extensively, all questions were answered. We discussed different therapeutic options, explained to them that at this time the medications are only able to treat the symptoms, there are no medications available that will reverse or fully resolve the symptoms. They expressed understanding. They wish to hold off on starting any medication at this time. At future visit will consider use of dopamine agonist vs L-dopa. Counseled patient on the importance of exercise. Will check brain MRI, follow up in 4 months, earlier if needed.    Jim Like, DO  [facility] [address] [place], [place] [address]  Phone [phone] Fax [phone]

## 2013-08-17 ENCOUNTER — Encounter: Payer: Self-pay | Admitting: Neurology

## 2013-08-22 ENCOUNTER — Inpatient Hospital Stay: Admission: RE | Admit: 2013-08-22 | Payer: Medicare Other | Source: Ambulatory Visit

## 2013-08-26 ENCOUNTER — Ambulatory Visit
Admission: RE | Admit: 2013-08-26 | Discharge: 2013-08-26 | Disposition: A | Discharge status: Home / Self Care | Payer: Medicare Other | Source: Ambulatory Visit | Attending: Neurology | Admitting: Neurology

## 2013-08-26 DIAGNOSIS — R251 Tremor, unspecified: Secondary | ICD-10-CM

## 2013-08-26 DIAGNOSIS — R279 Unspecified lack of coordination: Secondary | ICD-10-CM

## 2013-08-26 DIAGNOSIS — R259 Unspecified abnormal involuntary movements: Secondary | ICD-10-CM | POA: Diagnosis not present

## 2013-08-28 DIAGNOSIS — K5730 Diverticulosis of large intestine without perforation or abscess without bleeding: Secondary | ICD-10-CM | POA: Diagnosis not present

## 2013-08-28 DIAGNOSIS — Z09 Encounter for follow-up examination after completed treatment for conditions other than malignant neoplasm: Secondary | ICD-10-CM | POA: Diagnosis not present

## 2013-08-28 DIAGNOSIS — Z86010 Personal history of colonic polyps: Secondary | ICD-10-CM | POA: Diagnosis not present

## 2013-08-29 ENCOUNTER — Encounter: Payer: Self-pay | Admitting: Neurology

## 2013-09-13 ENCOUNTER — Ambulatory Visit (INDEPENDENT_AMBULATORY_CARE_PROVIDER_SITE_OTHER): Payer: Medicare Other | Admitting: Neurology

## 2013-09-13 ENCOUNTER — Encounter: Payer: Self-pay | Admitting: Neurology

## 2013-09-13 VITALS — BP 162/91 | HR 53 | Ht 69.75 in | Wt 210.0 lb

## 2013-09-13 DIAGNOSIS — R251 Tremor, unspecified: Secondary | ICD-10-CM

## 2013-09-13 DIAGNOSIS — R259 Unspecified abnormal involuntary movements: Secondary | ICD-10-CM

## 2013-09-13 DIAGNOSIS — I619 Nontraumatic intracerebral hemorrhage, unspecified: Secondary | ICD-10-CM

## 2013-09-13 NOTE — Progress Notes (Addendum)
[facility]    Provider:  Dr Janann Colonel Referring Provider: Hulan Fess, MD Primary Care Physician:  Gennette Pac, MD  CC:  tremor  HPI:  Darryl [name] is a 79 y.o. male here as a follow up from Dr. Rex Kras for tremor evaluation. Last visit was 07/2013 at which time there was concern for a parkinsonism vs essential tremor. A MRI of the brain was ordered to rule out vascular parkinsonism. MRI reviewed and showed multiple mico-bleeds concerning for possible amyloid angiopathy. Patient returns today reporting overall stability of his symptoms. Continues to have difficulty with tremor, predominantly when trying to eat. Notes a history of poorly controlled BP on multiple medications. Takes ASA daily, reports history of MI, CAD with CABG x 4 in the past.   Initial visit 07/2013: First noticed tremor around 3 years ago, started on right but now involves both hands but is R>L. Described as both a rest and action/postural tremor. Bothers him the most when trying to eat. Handwriting has gotten messier, no change in size. Wife notes some generalized slowing down, some shuffling of his feet. No muscle stiffness or cramps. He does not some gait instability, has some trouble getting in and out of the car. Feels very unsteady going down stairs. Has not noticed anything that makes the tremor worse. No intake of caffeine or EtOH. Sleeps well, wife notes some REM behavior disorder (talking in his sleep). No known difficulty with sense of smell. No known stroke or TIA, has had CAD with CABG x 4.   He thinks his father may have had a tremor.    Review of Systems: Out of a complete 14 system review, the patient complains of only the following symptoms, and all other reviewed systems are negative. Positive fatigue easy bruising headache weakness tremor   History   Social History  . Marital Status: Married    Spouse Name: [name]    Number of Children: 4  . Years of Education:  college   Occupational History  . Not on file.   Social History Main Topics  . Smoking status: Former Smoker -- 2.00 packs/day for 5 years    Types: Cigarettes    Quit date: 04/28/1959  . Smokeless tobacco: Never Used     Comment: 55+ yrs ago  . Alcohol Use: No  . Drug Use: No  . Sexual Activity: Not on file   Other Topics Concern  . Not on file   Social History Narrative   Patient is married to [name]), has 4 children   Patient is right handed   Education level is college   Caffeine consumption is none    Family History  Problem Relation Age of Onset  . CAD Father 16    ASCAD  . CAD Sister     Past Medical History  Diagnosis Date  . Hypercholesteremia   . LBBB (left bundle branch block)   . Sleep disturbance   . Insomnia   . Tremor   . HTN (hypertension)     orthostatic  . Dyslipidemia   . Thrombocytopenia   . Colon polyp   . Diverticulosis   . Renal insufficiency     renal duplex scan in 2012 showed no renal artery stenosis  . Coronary atherosclerosis of native coronary artery     ASCAD s/p CABG 6/200 a5, s/p PCI of the SVG to PDA and radial artery graft to OM-2 11/05, PCI left circumflex spring of 2010,   . Gastric ulcer   .  Lumbar spondylolysis     multilevel DJD with mild spinal stenosis L3-L5  . Headache(784.0)   . Arthritis   . Thrombocytopenia   . Elevated fasting glucose   . Gastritis   . Hiatal hernia   . Migraines   . H/O urinary retention     post surgical procedures    Past Surgical History  Procedure Laterality Date  . Wisdom tooth extraction    . Esophagogastroduodenoscopy      10/2001, 12/2003, 04/2004  . Coronary artery bypass graft  2005  . Shoulder arthroscopy Left 9/10  . Appendectomy  62    1963  . Stones      hx  . Coronary angioplasty  6/10,10/05  . Cholecystectomy  1998  . Lumbar laminectomy/decompression microdiscectomy N/A 05/03/2013    Procedure: LUMBAR LAMINECTOMY/DCOMPRESSION MICRODISCECTOMY RIGHT LUMBAR FOUR  FIVE;  Surgeon: [name], MD;  Location: [facility];  Service: Neurosurgery;  Laterality: N/A;  . Colonoscopy  10/2001  . Colon surgery  03/14/2008    Current Outpatient Prescriptions  Medication Sig Dispense Refill  . ALPRAZolam (XANAX) 0.25 MG tablet Take 0.25 mg by mouth daily.      [name] aspirin EC 325 MG tablet Take 325 mg by mouth daily.      . cloNIDine (CATAPRES) 0.2 MG tablet Take 0.2 mg by mouth 2 (two) times daily.      [name] HYDROcodone-acetaminophen (NORCO/VICODIN) 5-325 MG per tablet Take 1 tablet by mouth daily as needed for moderate pain.  80 tablet  0  . irbesartan (AVAPRO) 300 MG tablet Take 300 mg by mouth every morning.      [name] lisinopril (PRINIVIL,ZESTRIL) 20 MG tablet Take 40 mg by mouth 2 (two) times daily.      . metoprolol tartrate (LOPRESSOR) 25 MG tablet Take 50 mg by mouth 2 (two) times daily.      . nitroGLYCERIN (NITROSTAT) 0.4 MG SL tablet Place 0.4 mg under the tongue every 5 (five) minutes as needed for chest pain.      . Omega-3 Fatty Acids (FISH OIL PO) Take 6 g by mouth daily. Carlson's Liquid      . pantoprazole (PROTONIX) 40 MG tablet Take 40 mg by mouth every morning.      . tamsulosin (FLOMAX) 0.4 MG CAPS capsule Take 1 capsule (0.4 mg total) by mouth daily.  40 capsule  0  . temazepam (RESTORIL) 15 MG capsule Take 15 mg by mouth daily.      [name] terazosin (HYTRIN) 1 MG capsule Take 1 mg by mouth every evening.      . traMADol (ULTRAM) 50 MG tablet Take 50 mg by mouth every 6 (six) hours as needed for pain.       No current facility-administered medications for this visit.    Allergies as of 09/13/2013 - Review Complete 09/13/2013  Allergen Reaction Noted  . Aspirin Other (See Comments) 04/27/2013  . Celebrex [celecoxib] Other (See Comments) 04/10/2013  . Nsaids Other (See Comments) 04/10/2013  . Toprol xl [metoprolol tartrate] Swelling 04/10/2013  . Altace [ramipril]  04/10/2013  . Levothyroxine Other (See Comments) 08/11/2013  . Biaxin  [clarithromycin] Nausea Only 04/10/2013  . Tetracyclines & related Nausea Only 04/10/2013    Vitals: BP 162/91  Pulse 53  Ht 5' 9.75" (1.772 m)  Wt 210 lb (95.255 kg)  BMI 30.34 kg/m2 Last Weight:  Wt Readings from Last 1 Encounters:  09/13/13 210 lb (95.255 kg)   Last Height:   Ht Readings from  Last 1 Encounters:  09/13/13 5' 9.75" (1.772 m)     Physical exam: Exam: Gen: NAD, conversant Eyes: anicteric sclerae, moist conjunctivae, minimally decreased blink rate HENT: Atraumatic, oropharynx clear Neck: Trachea midline; supple,  Lungs: CTA, no wheezing, rales, rhonic                          CV: RRR, no MRG Abdomen: Soft, non-tender;  Extremities: No peripheral edema  Skin: Normal temperature, no rash,  Psych: Appropriate affect, pleasant  Neuro: MS: AA&Ox3, appropriately interactive, normal affect   Speech: fluent w/o paraphasic error  Memory: good recent and remote recall  CN: PERRL, EOMI no nystagmus, no ptosis, sensation intact to LT V1-V3 bilat, face symmetric, no weakness, hearing grossly intact, palate elevates symmetrically, shoulder shrug 5/5 bilat,  tongue protrudes midline, no fasiculations noted.  Motor: Mild cogwheeling rigidity bilateral wrists Strength: 5/5  In all extremities  Coord: Rest tremor noted right hand greater than left, bilateral intention and postural tremor. Bradykinesia noted with finger tapping left greater than right, hand opening closing left greater than right and rapid alternating movement. Bradykinesia with tapping of left foot, right within normal limits. Patient's writing sample notable for normal size a legible signature, large irregular Archimedes spiral   Reflexes: symmetrical, bilat downgoing toes  Sens: LT intact in all extremities  Gait: With arms crossed able to stand slowly but without assistance, decreased arm swing on the left, mild shuffling of steps, re emergent tremor in the right, Romberg negative, multiple steps  back with pull test    Assessment:  After physical and neurologic examination, review of laboratory studies, imaging, neurophysiology testing and pre-existing records, assessment will be reviewed on the problem list.  Plan:  Treatment plan and additional workup will be reviewed under Problem List.  1)Tremor 13)ICH  79 year old gentleman presenting for follow up evaluation of tremor, which is been ongoing for multiple years and is getting progressively worse. He describes both a rest and action component, he feels action component is causing the most difficulty. MRI brain reviewed showed findings concerning for possible amyloid angiopathy. He notes a history of poorly controlled BP on multiple medications. Counseled him on the importance of improved control of BP. He will follow up with Dr Rex Kras for BP medication management. From a neurological standpoint patient would benefit from discontinuing daily ASA, history of MI/CABG in the past makes this a more difficult decision. Will refer patient to stroke specialist, Dr Leonie Man, for 2nd opinion. Will also need to discuss with patients cardiologist after input from Dr Leonie Man. Follow up after appointment with Dr Leonie Man.   Addendum 09/15/2013:  Case briefly discussed with primary care physician, Dr Hulan Fess. Goal blood pressure control will be <130/80. Will call patient and discuss ASA, after review of imaging with stroke specialist I feel best course of action is to keep patient on low dose ASA, 81mg  daily. Will call patient and discuss plan.    Jim Like, DO  [facility] [address] [place], [place] [address]  Phone [phone] Fax [phone]

## 2013-09-19 DIAGNOSIS — L820 Inflamed seborrheic keratosis: Secondary | ICD-10-CM | POA: Diagnosis not present

## 2013-09-19 DIAGNOSIS — L821 Other seborrheic keratosis: Secondary | ICD-10-CM | POA: Diagnosis not present

## 2013-09-19 DIAGNOSIS — L570 Actinic keratosis: Secondary | ICD-10-CM | POA: Diagnosis not present

## 2013-09-19 DIAGNOSIS — D692 Other nonthrombocytopenic purpura: Secondary | ICD-10-CM | POA: Diagnosis not present

## 2013-10-19 DIAGNOSIS — N189 Chronic kidney disease, unspecified: Secondary | ICD-10-CM | POA: Diagnosis not present

## 2013-10-19 DIAGNOSIS — R7301 Impaired fasting glucose: Secondary | ICD-10-CM | POA: Diagnosis not present

## 2013-10-19 DIAGNOSIS — D696 Thrombocytopenia, unspecified: Secondary | ICD-10-CM | POA: Diagnosis not present

## 2013-10-19 DIAGNOSIS — R930 Abnormal findings on diagnostic imaging of skull and head, not elsewhere classified: Secondary | ICD-10-CM | POA: Diagnosis not present

## 2013-10-19 DIAGNOSIS — I10 Essential (primary) hypertension: Secondary | ICD-10-CM | POA: Diagnosis not present

## 2013-10-30 ENCOUNTER — Encounter: Payer: Self-pay | Admitting: Neurology

## 2013-10-30 ENCOUNTER — Ambulatory Visit (INDEPENDENT_AMBULATORY_CARE_PROVIDER_SITE_OTHER): Payer: Medicare Other | Admitting: Neurology

## 2013-10-30 VITALS — Ht 68.0 in | Wt 204.0 lb

## 2013-10-30 DIAGNOSIS — I619 Nontraumatic intracerebral hemorrhage, unspecified: Secondary | ICD-10-CM | POA: Diagnosis not present

## 2013-10-30 DIAGNOSIS — I6789 Other cerebrovascular disease: Secondary | ICD-10-CM

## 2013-10-30 DIAGNOSIS — I679 Cerebrovascular disease, unspecified: Secondary | ICD-10-CM | POA: Diagnosis not present

## 2013-10-30 NOTE — Patient Instructions (Signed)
I had a long discussion with the patient and his wife regarding the results of his brain MRI scan showing multiple cerebral micro-bleeds which are likely related to his long-standing uncontrolled hypertension related small vessel disease. Amyloid   angiopathy and multiple cavernous angiomas are less likely given his clinical presentation and absence of any other ongoing focal neurological symptoms.I encouraged him to see his family doctor and Dr. Radford Pax his cardiologist and discuss aggressive blood pressure management with goal to keep blood pressure below 130/90. I also advised him to change his aspirin to 81 mg every other day given the significant bruising that he is having. Check screening carotid ultrasound and after Doppler studies. Follow up with Dr. Janann Colonel for his tremors. No followup appointment is necessary with me.

## 2013-10-30 NOTE — Progress Notes (Signed)
[facility] [address] [place]. [place] [address] [phone]       OFFICE CONSULT NOTE  Mr. DRAY DENTE Date of Birth:  08/15/1934 Medical Record Number:  [id number]   Referring MD:  Dr Janann Colonel  Reason for Referral:  Cerebral microbleeds  HPI: 34 year Caucasian male referred by Dr. Janann Colonel for vascular neurologic opinion for abnormal MRI scan on 08/26/13 showing multiple cerebral micro-bleeds. Patient denies any known history of strokes, TIAs, significant neurovascular problems. He was evaluated for upper extremity tremors and MRI scan as per workup revealed multiple supratentorial as well as infratentorial cerebral micro-bleeds there is a concern for hypertensive small vessel disease, multiple cavernoma  Or amyloid angiopathy. The patient denies significant memory loss, history of migraines or seizures. He in fact has no prior neurological complaints except tremors. He does have vascular risk factors of uncontrolled hypertension and heart disease. He has no family history of brain hemorrhage or dementia at a young. He has been on aspirin 325 mg in the past which has now been reduced to 81 mg.  ROS:   14 system review of systems is positive for ringing in the ears, easy bruising, headache, tremor, anxiety, restless legs  PMH:  Past Medical History  Diagnosis Date  . Hypercholesteremia   . LBBB (left bundle branch block)   . Sleep disturbance   . Insomnia   . Tremor   . HTN (hypertension)     orthostatic  . Dyslipidemia   . Thrombocytopenia   . Colon polyp   . Diverticulosis   . Renal insufficiency     renal duplex scan in 2012 showed no renal artery stenosis  . Coronary atherosclerosis of native coronary artery     ASCAD s/p CABG 6/200 a5, s/p PCI of the SVG to PDA and radial artery graft to OM-2 11/05, PCI left circumflex spring of 2010,   . Gastric ulcer   . Lumbar spondylolysis     multilevel DJD with mild spinal stenosis L3-L5  . Headache(784.0)    . Arthritis   . Thrombocytopenia   . Elevated fasting glucose   . Gastritis   . Hiatal hernia   . Migraines   . H/O urinary retention     post surgical procedures    Social History:  History   Social History  . Marital Status: Married    Spouse Name: [name]    Number of Children: 4  . Years of Education: college   Occupational History  . Not on file.   Social History Main Topics  . Smoking status: Former Smoker -- 2.00 packs/day for 5 years    Types: Cigarettes    Quit date: 04/28/1959  . Smokeless tobacco: Never Used     Comment: 55+ yrs ago  . Alcohol Use: No  . Drug Use: No  . Sexual Activity: Not on file   Other Topics Concern  . Not on file   Social History Narrative   Patient is married to [name]), has 4 children   Patient is right handed   Education level is college   Caffeine consumption is none    Medications:   Current Outpatient Prescriptions on File Prior to Visit  Medication Sig Dispense Refill  . ALPRAZolam (XANAX) 0.25 MG tablet Take 0.25 mg by mouth daily.      [name] aspirin EC 325 MG tablet Take 325 mg by mouth daily.      . cloNIDine (CATAPRES) 0.2 MG tablet Take 0.2 mg by mouth  2 (two) times daily.      [name] HYDROcodone-acetaminophen (NORCO/VICODIN) 5-325 MG per tablet Take 1 tablet by mouth daily as needed for moderate pain.  80 tablet  0  . irbesartan (AVAPRO) 300 MG tablet Take 300 mg by mouth every morning.      [name] lisinopril (PRINIVIL,ZESTRIL) 20 MG tablet Take 40 mg by mouth 2 (two) times daily.      . metoprolol tartrate (LOPRESSOR) 25 MG tablet Take 50 mg by mouth 2 (two) times daily.      . Omega-3 Fatty Acids (FISH OIL PO) Take 6 g by mouth daily. Carlson's Liquid      . pantoprazole (PROTONIX) 40 MG tablet Take 40 mg by mouth every morning.      . tamsulosin (FLOMAX) 0.4 MG CAPS capsule Take 1 capsule (0.4 mg total) by mouth daily.  40 capsule  0  . temazepam (RESTORIL) 15 MG capsule Take 15 mg by mouth daily.      [name] terazosin (HYTRIN)  1 MG capsule Take 1 mg by mouth every evening.      . traMADol (ULTRAM) 50 MG tablet Take 50 mg by mouth every 6 (six) hours as needed for pain.       No current facility-administered medications on file prior to visit.    Allergies:   Allergies  Allergen Reactions  . Aspirin Other (See Comments)    ulcers  . Celebrex [Celecoxib] Other (See Comments)    Due to ulcer.  . Nsaids Other (See Comments)    Due to ulcer.  . Toprol Xl [Metoprolol Tartrate] Swelling  . Altace [Ramipril]   . Levothyroxine Other (See Comments)    "made him feel bad"  . Biaxin [Clarithromycin] Nausea Only  . Tetracyclines & Related Nausea Only    Physical Exam General: well developed, well nourished, seated, in no evident distress Head: head normocephalic and atraumatic. Orohparynx benign Neck: supple with no carotid or supraclavicular bruits Cardiovascular: regular rate and rhythm, no murmurs Musculoskeletal: no deformity Skin:  no rash/petichiae Vascular:  Normal pulses all extremities  Vitals ; pulse 60/min. BP 187/92. Wt  204 lbs. Ht 68 inches Neurologic Exam Mental Status: Awake and fully alert. Oriented to place and time. Recent and remote memory intact. Attention span, concentration and fund of knowledge appropriate. Mood and affect appropriate.  Cranial Nerves: Fundoscopic exam reveals sharp disc margins. Pupils equal, briskly reactive to light. Extraocular movements full without nystagmus. Visual fields full to confrontation. Hearing intact. Facial sensation intact. Face, tongue, palate moves normally and symmetrically.  Motor: Normal bulk and tone. Normal strength in all tested extremity muscles. Mild action tremor of outstretched upper extremity is which is absent at rest. Rapid repetitive movements are diminished on the left compared to the right. Minimal cogwheel rigidity left wrist. Sensory.: intact to tough and pinprick and vibratory.  Coordination: Rapid alternating movements normal in all  extremities. Finger-to-nose and heel-to-shin performed accurately bilaterally. Stable with pull test. No festination the Gait and Station: Arises from chair without difficulty. Stance is normal. Gait demonstrates normal stride length and balance . Able to heel, toe and tandem walk without difficulty.  Reflexes: 1+ and symmetric. Toes downgoing.   NIHSS  0 Modified Rankin 0  ASSESSMENT: 75 year Caucasian male with abnormal brain MRI scan showing multiple cortical and subcortical micro-hemorrhages likely related to chronic microvascular disease related to uncontrolled hypertension. Her blood angiopathy and multiple cavernoma is a less likely based on his clinical scenarios. Fortunately he has been asymptomatic  from these. Vascular risks factors of uncontrolled hypertension, heart disease and age. Mild upper extremity tremors possibly related to silent cerebrovascular disease.    PLAN: I had a long discussion with the patient and his wife regarding the results of his brain MRI scan showing multiple cerebral micro-bleeds which are likely related to his long-standing uncontrolled hypertension related small vessel disease. Amyloid   angiopathy and multiple cavernous angiomas are less likely given his clinical presentation and absence of any other ongoing focal neurological symptoms.I encouraged him to see his family doctor and Dr. Radford Pax his cardiologist and discuss aggressive blood pressure management with goal to keep blood pressure below 130/90. I also advised him to change his aspirin to 81 mg every other day given the significant bruising that he is having. Check screening carotid ultrasound and after Doppler studies. Follow up with Dr. Janann Colonel for his tremors. No followup appointment is necessary with me.    Note: This document was prepared with digital dictation and possible smart phrase technology. Any transcriptional errors that result from this process are unintentional.

## 2013-10-30 NOTE — Progress Notes (Signed)
Please have patient check BP daily for 1 week and call with results.  I received a message from his neurologist concerning needing tight BP control.  His BP was controlled on last OV

## 2013-10-31 NOTE — Progress Notes (Signed)
Pt is aware and agreed to Check BP daily and call us with results in a week.

## 2013-11-10 ENCOUNTER — Ambulatory Visit (INDEPENDENT_AMBULATORY_CARE_PROVIDER_SITE_OTHER): Payer: Medicare Other

## 2013-11-10 DIAGNOSIS — G9389 Other specified disorders of brain: Secondary | ICD-10-CM | POA: Diagnosis not present

## 2013-11-10 DIAGNOSIS — I619 Nontraumatic intracerebral hemorrhage, unspecified: Secondary | ICD-10-CM

## 2013-11-10 DIAGNOSIS — R0989 Other specified symptoms and signs involving the circulatory and respiratory systems: Secondary | ICD-10-CM

## 2013-11-10 DIAGNOSIS — I6789 Other cerebrovascular disease: Secondary | ICD-10-CM

## 2013-11-10 DIAGNOSIS — Z0289 Encounter for other administrative examinations: Secondary | ICD-10-CM

## 2013-11-22 DIAGNOSIS — F411 Generalized anxiety disorder: Secondary | ICD-10-CM | POA: Diagnosis not present

## 2013-11-22 DIAGNOSIS — I2510 Atherosclerotic heart disease of native coronary artery without angina pectoris: Secondary | ICD-10-CM | POA: Diagnosis not present

## 2013-11-22 DIAGNOSIS — G8929 Other chronic pain: Secondary | ICD-10-CM | POA: Diagnosis not present

## 2013-11-22 DIAGNOSIS — R51 Headache: Secondary | ICD-10-CM | POA: Diagnosis not present

## 2013-11-22 DIAGNOSIS — I10 Essential (primary) hypertension: Secondary | ICD-10-CM | POA: Diagnosis not present

## 2013-11-22 DIAGNOSIS — G4700 Insomnia, unspecified: Secondary | ICD-10-CM | POA: Diagnosis not present

## 2013-11-24 ENCOUNTER — Encounter: Payer: Self-pay | Admitting: Neurology

## 2013-11-27 ENCOUNTER — Encounter: Payer: Self-pay | Admitting: Neurology

## 2013-12-05 ENCOUNTER — Telehealth: Payer: Self-pay | Admitting: *Deleted

## 2013-12-05 NOTE — Telephone Encounter (Signed)
LMVM for pt that carotid doppler normal, TCD, limited study, due to absent temporal bony windows.  Mild hardening of the arteries, age related changes. (not worrisome finding) per Dr. Leonie Man.  I realize Dr. Janann Colonel gave you results thru email.  If questions to call back.

## 2013-12-06 DIAGNOSIS — N401 Enlarged prostate with lower urinary tract symptoms: Secondary | ICD-10-CM | POA: Diagnosis not present

## 2013-12-06 DIAGNOSIS — N402 Nodular prostate without lower urinary tract symptoms: Secondary | ICD-10-CM | POA: Diagnosis not present

## 2013-12-06 DIAGNOSIS — N139 Obstructive and reflux uropathy, unspecified: Secondary | ICD-10-CM | POA: Diagnosis not present

## 2013-12-06 DIAGNOSIS — N138 Other obstructive and reflux uropathy: Secondary | ICD-10-CM | POA: Diagnosis not present

## 2013-12-08 DIAGNOSIS — I10 Essential (primary) hypertension: Secondary | ICD-10-CM | POA: Diagnosis not present

## 2013-12-08 DIAGNOSIS — G4700 Insomnia, unspecified: Secondary | ICD-10-CM | POA: Diagnosis not present

## 2013-12-08 DIAGNOSIS — F411 Generalized anxiety disorder: Secondary | ICD-10-CM | POA: Diagnosis not present

## 2013-12-12 ENCOUNTER — Encounter: Payer: Self-pay | Admitting: Neurology

## 2013-12-12 ENCOUNTER — Ambulatory Visit (INDEPENDENT_AMBULATORY_CARE_PROVIDER_SITE_OTHER): Payer: Medicare Other | Admitting: Neurology

## 2013-12-12 VITALS — BP 189/90 | HR 57 | Ht 68.0 in | Wt 222.0 lb

## 2013-12-12 DIAGNOSIS — N402 Nodular prostate without lower urinary tract symptoms: Secondary | ICD-10-CM | POA: Diagnosis not present

## 2013-12-12 DIAGNOSIS — R259 Unspecified abnormal involuntary movements: Secondary | ICD-10-CM | POA: Diagnosis not present

## 2013-12-12 DIAGNOSIS — I619 Nontraumatic intracerebral hemorrhage, unspecified: Secondary | ICD-10-CM

## 2013-12-12 DIAGNOSIS — R972 Elevated prostate specific antigen [PSA]: Secondary | ICD-10-CM | POA: Diagnosis not present

## 2013-12-12 DIAGNOSIS — N139 Obstructive and reflux uropathy, unspecified: Secondary | ICD-10-CM | POA: Diagnosis not present

## 2013-12-12 DIAGNOSIS — N138 Other obstructive and reflux uropathy: Secondary | ICD-10-CM | POA: Diagnosis not present

## 2013-12-12 DIAGNOSIS — N401 Enlarged prostate with lower urinary tract symptoms: Secondary | ICD-10-CM | POA: Diagnosis not present

## 2013-12-12 DIAGNOSIS — R251 Tremor, unspecified: Secondary | ICD-10-CM

## 2013-12-12 NOTE — Progress Notes (Signed)
[facility]    Provider:  Dr Janann Colonel Referring Provider: Hulan Fess, MD Primary Care Physician:  Gennette Pac, MD  CC:  tremor  HPI:  Darryl [name] is a 79 y.o. male here as a follow up from Dr. Rex Kras for tremor evaluation. Last visit was 08/2013 at which time there was concern over possible amyloid angiopathy. He was evaluated by Dr Leonie Man with findings likely related to chronic HTN/small vessel disease. His ASA 81mg  was changed to every other day due to bruising. Overall doing well. Continues to have difficulties with his blood pressure. Currently working with Dr [name] (cardiology).   The tremor is stable, no worsening, remains stable. He currently reports some pain radiating from lower back down the posterior surface of his right leg. He has a history of lumbar disc herniation s/p surgery.    Initial visit 07/2013: First noticed tremor around 3 years ago, started on right but now involves both hands but is R>L. Described as both a rest and action/postural tremor. Bothers him the most when trying to eat. Handwriting has gotten messier, no change in size. Wife notes some generalized slowing down, some shuffling of his feet. No muscle stiffness or cramps. He does not some gait instability, has some trouble getting in and out of the car. Feels very unsteady going down stairs. Has not noticed anything that makes the tremor worse. No intake of caffeine or EtOH. Sleeps well, wife notes some REM behavior disorder (talking in his sleep). No known difficulty with sense of smell. No known stroke or TIA, has had CAD with CABG x 4.   He thinks his father may have had a tremor.    Review of Systems: Out of a complete 14 system review, the patient complains of only the following symptoms, and all other reviewed systems are negative. Positive fatigue easy bruising headache weakness tremor   History   Social History  . Marital Status: Married    Spouse Name:  [name]    Number of Children: 4  . Years of Education: college   Occupational History  . Not on file.   Social History Main Topics  . Smoking status: Former Smoker -- 2.00 packs/day for 5 years    Types: Cigarettes    Quit date: 04/28/1959  . Smokeless tobacco: Never Used     Comment: 55+ yrs ago  . Alcohol Use: No  . Drug Use: No  . Sexual Activity: Not on file   Other Topics Concern  . Not on file   Social History Narrative   Patient is married to [name]), has 4 children   Patient is right handed   Education level is college   Caffeine consumption is none    Family History  Problem Relation Age of Onset  . CAD Father 48    ASCAD  . CAD Sister     Past Medical History  Diagnosis Date  . Hypercholesteremia   . LBBB (left bundle branch block)   . Sleep disturbance   . Insomnia   . Tremor   . HTN (hypertension)     orthostatic  . Dyslipidemia   . Thrombocytopenia   . Colon polyp   . Diverticulosis   . Renal insufficiency     renal duplex scan in 2012 showed no renal artery stenosis  . Coronary atherosclerosis of native coronary artery     ASCAD s/p CABG 6/200 a5, s/p PCI of the SVG to PDA and radial artery graft to  OM-2 11/05, PCI left circumflex spring of 2010,   . Gastric ulcer   . Lumbar spondylolysis     multilevel DJD with mild spinal stenosis L3-L5  . Headache(784.0)   . Arthritis   . Thrombocytopenia   . Elevated fasting glucose   . Gastritis   . Hiatal hernia   . Migraines   . H/O urinary retention     post surgical procedures    Past Surgical History  Procedure Laterality Date  . Wisdom tooth extraction    . Esophagogastroduodenoscopy      10/2001, 12/2003, 04/2004  . Coronary artery bypass graft  2005  . Shoulder arthroscopy Left 9/10  . Appendectomy  62    1963  . Stones      hx  . Coronary angioplasty  6/10,10/05  . Cholecystectomy  1998  . Lumbar laminectomy/decompression microdiscectomy N/A 05/03/2013    Procedure: LUMBAR  LAMINECTOMY/DCOMPRESSION MICRODISCECTOMY RIGHT LUMBAR FOUR FIVE;  Surgeon: [name], MD;  Location: [facility];  Service: Neurosurgery;  Laterality: N/A;  . Colonoscopy  10/2001  . Colon surgery  03/14/2008    Current Outpatient Prescriptions  Medication Sig Dispense Refill  . aspirin EC 325 MG tablet Take 325 mg by mouth daily.      . citalopram (CELEXA) 20 MG tablet Take 20 mg by mouth daily.      . cloNIDine (CATAPRES) 0.2 MG tablet Take 0.2 mg by mouth 2 (two) times daily.      . irbesartan (AVAPRO) 300 MG tablet Take 300 mg by mouth every morning.      . metoprolol tartrate (LOPRESSOR) 25 MG tablet Take 50 mg by mouth 2 (two) times daily.      . Omega-3 Fatty Acids (FISH OIL PO) Take 6 g by mouth daily. Carlson's Liquid      . pantoprazole (PROTONIX) 40 MG tablet Take 40 mg by mouth every morning.      . tamsulosin (FLOMAX) 0.4 MG CAPS capsule Take 1 capsule (0.4 mg total) by mouth daily.  40 capsule  0  . terazosin (HYTRIN) 1 MG capsule Take 1 mg by mouth every evening.      . traMADol (ULTRAM) 50 MG tablet Take 50 mg by mouth every 6 (six) hours as needed for pain.       No current facility-administered medications for this visit.    Allergies as of 12/12/2013 - Review Complete 12/12/2013  Allergen Reaction Noted  . Aspirin Other (See Comments) 04/27/2013  . Celebrex [celecoxib] Other (See Comments) 04/10/2013  . Nsaids Other (See Comments) 04/10/2013  . Toprol xl [metoprolol tartrate] Swelling 04/10/2013  . Altace [ramipril]  04/10/2013  . Levothyroxine Other (See Comments) 08/11/2013  . Biaxin [clarithromycin] Nausea Only 04/10/2013  . Tetracyclines & related Nausea Only 04/10/2013    Vitals: BP 189/90  Pulse 57  Ht 5\' 8"  (1.727 m)  Wt 222 lb (100.699 kg)  BMI 33.76 kg/m2 Last Weight:  Wt Readings from Last 1 Encounters:  12/12/13 222 lb (100.699 kg)   Last Height:   Ht Readings from Last 1 Encounters:  12/12/13 5\' 8"  (1.727 m)     Physical  exam: Exam: Gen: NAD, conversant Eyes: anicteric sclerae, moist conjunctivae, minimally decreased blink rate HENT: Atraumatic, oropharynx clear Neck: Trachea midline; supple,  Lungs: CTA, no wheezing, rales, rhonic                          CV: RRR, no MRG  Abdomen: Soft, non-tender;  Extremities: No peripheral edema  Skin: Normal temperature, no rash,  Psych: Appropriate affect, pleasant  Neuro: MS: AA&Ox3, appropriately interactive, normal affect   Speech: fluent w/o paraphasic error  Memory: good recent and remote recall  CN: PERRL, EOMI no nystagmus, no ptosis, sensation intact to LT V1-V3 bilat, face symmetric, no weakness, hearing grossly intact, palate elevates symmetrically, shoulder shrug 5/5 bilat,  tongue protrudes midline, no fasiculations noted.  Motor: Mild cogwheeling rigidity bilateral wrists Strength: 5/5  In all extremities  Coord: Minimal rest tremor noted right hand greater than left, bilateral intention and postural tremor. Bradykinesia noted with finger tapping left greater than right, hand opening closing left greater than right and rapid alternating movement. Bradykinesia with tapping of left foot, right within normal limits. Patient's writing sample notable for normal size a legible signature, large irregular Archimedes spiral   Reflexes: symmetrical, bilat downgoing toes  Sens: LT intact in all extremities  Gait: With arms crossed able to stand slowly but without assistance, decreased arm swing on the left, mild shuffling of steps, re emergent tremor in the right, Romberg negative, multiple steps back with pull test    Assessment:  After physical and neurologic examination, review of laboratory studies, imaging, neurophysiology testing and pre-existing records, assessment will be reviewed on the problem list.  Plan:  Treatment plan and additional workup will be reviewed under Problem List.  1)Tremor 69)ICH  79 year old gentleman presenting for follow  up evaluation of tremor, which is been ongoing for multiple years and is getting progressively worse. He describes both a rest and action component, he feels action component is causing the most difficulty. MRI brain reviewed showed multiple micro-hemorrhages. Was evaluated by Dr Leonie Man who suggested aggressive BP control of <130/90. Currently working with Dr [name] to manage BP. Will hold off on starting medication to treat tremor at this time. Will follow up in 6 months or earlier if needed.    Jim Like, DO  [facility] [address] [place],  [address]  Phone [phone] Fax [phone]

## 2013-12-13 ENCOUNTER — Encounter: Payer: Self-pay | Admitting: Neurology

## 2013-12-25 DIAGNOSIS — K259 Gastric ulcer, unspecified as acute or chronic, without hemorrhage or perforation: Secondary | ICD-10-CM | POA: Diagnosis not present

## 2013-12-25 DIAGNOSIS — IMO0002 Reserved for concepts with insufficient information to code with codable children: Secondary | ICD-10-CM | POA: Diagnosis not present

## 2013-12-25 DIAGNOSIS — M5126 Other intervertebral disc displacement, lumbar region: Secondary | ICD-10-CM | POA: Diagnosis not present

## 2013-12-27 DIAGNOSIS — IMO0002 Reserved for concepts with insufficient information to code with codable children: Secondary | ICD-10-CM | POA: Diagnosis not present

## 2014-01-05 DIAGNOSIS — I10 Essential (primary) hypertension: Secondary | ICD-10-CM | POA: Diagnosis not present

## 2014-01-05 DIAGNOSIS — F411 Generalized anxiety disorder: Secondary | ICD-10-CM | POA: Diagnosis not present

## 2014-02-02 DIAGNOSIS — F411 Generalized anxiety disorder: Secondary | ICD-10-CM | POA: Diagnosis not present

## 2014-02-02 DIAGNOSIS — I10 Essential (primary) hypertension: Secondary | ICD-10-CM | POA: Diagnosis not present

## 2014-02-19 DIAGNOSIS — IMO0002 Reserved for concepts with insufficient information to code with codable children: Secondary | ICD-10-CM | POA: Diagnosis not present

## 2014-02-19 DIAGNOSIS — M5126 Other intervertebral disc displacement, lumbar region: Secondary | ICD-10-CM | POA: Diagnosis not present

## 2014-02-19 DIAGNOSIS — R51 Headache: Secondary | ICD-10-CM | POA: Diagnosis not present

## 2014-02-19 DIAGNOSIS — I10 Essential (primary) hypertension: Secondary | ICD-10-CM | POA: Diagnosis not present

## 2014-02-21 DIAGNOSIS — IMO0002 Reserved for concepts with insufficient information to code with codable children: Secondary | ICD-10-CM | POA: Diagnosis not present

## 2014-03-09 ENCOUNTER — Encounter: Payer: Self-pay | Admitting: *Deleted

## 2014-03-13 DIAGNOSIS — R972 Elevated prostate specific antigen [PSA]: Secondary | ICD-10-CM | POA: Diagnosis not present

## 2014-03-15 DIAGNOSIS — I10 Essential (primary) hypertension: Secondary | ICD-10-CM | POA: Diagnosis not present

## 2014-03-15 DIAGNOSIS — IMO0002 Reserved for concepts with insufficient information to code with codable children: Secondary | ICD-10-CM | POA: Diagnosis not present

## 2014-03-15 DIAGNOSIS — M5126 Other intervertebral disc displacement, lumbar region: Secondary | ICD-10-CM | POA: Diagnosis not present

## 2014-03-15 DIAGNOSIS — R51 Headache: Secondary | ICD-10-CM | POA: Diagnosis not present

## 2014-04-04 DIAGNOSIS — M5410 Radiculopathy, site unspecified: Secondary | ICD-10-CM | POA: Diagnosis not present

## 2014-04-04 DIAGNOSIS — M5116 Intervertebral disc disorders with radiculopathy, lumbar region: Secondary | ICD-10-CM | POA: Diagnosis not present

## 2014-04-16 ENCOUNTER — Ambulatory Visit: Payer: Medicare Other | Admitting: Cardiology

## 2014-04-17 ENCOUNTER — Ambulatory Visit (INDEPENDENT_AMBULATORY_CARE_PROVIDER_SITE_OTHER): Payer: Medicare Other | Admitting: Cardiology

## 2014-04-17 ENCOUNTER — Encounter: Payer: Self-pay | Admitting: Cardiology

## 2014-04-17 VITALS — BP 190/120 | HR 51 | Ht 68.0 in | Wt 205.0 lb

## 2014-04-17 DIAGNOSIS — E785 Hyperlipidemia, unspecified: Secondary | ICD-10-CM

## 2014-04-17 DIAGNOSIS — I447 Left bundle-branch block, unspecified: Secondary | ICD-10-CM

## 2014-04-17 DIAGNOSIS — I2584 Coronary atherosclerosis due to calcified coronary lesion: Secondary | ICD-10-CM

## 2014-04-17 DIAGNOSIS — I2510 Atherosclerotic heart disease of native coronary artery without angina pectoris: Secondary | ICD-10-CM | POA: Diagnosis not present

## 2014-04-17 DIAGNOSIS — I10 Essential (primary) hypertension: Secondary | ICD-10-CM

## 2014-04-17 DIAGNOSIS — G20 Parkinson's disease: Secondary | ICD-10-CM | POA: Insufficient documentation

## 2014-04-17 MED ORDER — TERAZOSIN HCL 1 MG PO CAPS: 2.0000 mg | ORAL_CAPSULE | Freq: Every evening | ORAL | Status: DC

## 2014-04-17 NOTE — Progress Notes (Signed)
[place], [place], [place]  [address] Phone: [phone] Fax:  [phone]  Date:  04/17/2014   ID:  Darryl [name], DOB July 21, 1934, MRN [id number]  PCP:  Gennette Pac, MD  Cardiologist:  [name], MD    History of Present Illness: Darryl [name] is a 79 y.o. male with a history of CAD/HTN/chronic LBBB/dyslipidemia who presents today for preoperative clearance for back surgery due to back pain from DJD of the lumbar spine. He denies any chest pain, LE edema, dizziness, palpitations or syncope. He has noticed some increase in DOE and it is intermittent and does not occur daily.  He says it does not occur often.  He walks for exercise but is limited by back problems.  He will walk about 15 minutes daily.      Wt Readings from Last 3 Encounters:  04/17/14 205 lb (92.987 kg)  12/12/13 222 lb (100.699 kg)  10/30/13 204 lb (92.534 kg)     Past Medical History  Diagnosis Date  . Hypercholesteremia   . LBBB (left bundle branch block)   . Sleep disturbance   . Insomnia   . Tremor   . HTN (hypertension)     orthostatic  . Dyslipidemia   . Thrombocytopenia   . Colon polyp   . Diverticulosis   . Renal insufficiency     renal duplex scan in 2012 showed no renal artery stenosis  . Coronary atherosclerosis of native coronary artery     ASCAD s/p CABG 6/200 a5, s/p PCI of the SVG to PDA and radial artery graft to OM-2 11/05, PCI left circumflex spring of 2010,   . Gastric ulcer   . Lumbar spondylolysis     multilevel DJD with mild spinal stenosis L3-L5  . Headache(784.0)   . Arthritis   . Thrombocytopenia   . Elevated fasting glucose   . Gastritis   . Hiatal hernia   . Migraines   . H/O urinary retention     post surgical procedures  . Parkinson's disease     Current Outpatient Prescriptions  Medication Sig Dispense Refill  . aspirin 81 MG tablet Take 81 mg by mouth daily.      . citalopram (CELEXA) 20 MG tablet Take 20 mg by mouth daily.       . cloNIDine (CATAPRES) 0.2 MG tablet Take 0.2 mg by mouth 2 (two) times daily. Takes 1 tablet in am and 1.5 tablets in PM daily      . irbesartan (AVAPRO) 300 MG tablet Take 300 mg by mouth every morning.      . metoprolol (LOPRESSOR) 50 MG tablet Take 50 mg by mouth 2 (two) times daily.      . Omega-3 Fatty Acids (FISH OIL PO) Take 800 mg by mouth daily. Carlson's Liquid      . pantoprazole (PROTONIX) 40 MG tablet Take 40 mg by mouth every morning.      . tamsulosin (FLOMAX) 0.4 MG CAPS capsule Take 1 capsule (0.4 mg total) by mouth daily.  40 capsule  0  . terazosin (HYTRIN) 1 MG capsule Take 1 mg by mouth every evening.      . traMADol (ULTRAM) 50 MG tablet Take 50 mg by mouth daily as needed.        No current facility-administered medications for this visit.    Allergies:    Allergies  Allergen Reactions  . Aspirin Other (See Comments)    ulcers  . Celebrex [Celecoxib] Other (See  Comments)    Due to ulcer.  . Nsaids Other (See Comments)    Due to ulcer.  . Toprol Xl [Metoprolol Tartrate] Swelling  . Altace [Ramipril]   . Levothyroxine Other (See Comments)    "made him feel bad"  . Biaxin [Clarithromycin] Nausea Only  . Tetracyclines & Related Nausea Only    Social History:  The patient  reports that he quit smoking about 55 years ago. His smoking use included Cigarettes. He has a 10 pack-year smoking history. He has never used smokeless tobacco. He reports that he does not drink alcohol or use illicit drugs.   Family History:  The patient's family history includes CAD in his sister; CAD (age of onset: 62) in his father.   ROS:  Please see the history of present illness.      All other systems reviewed and negative.   PHYSICAL EXAM: VS:  BP 190/120  Pulse 51  Ht 5\' 8"  (1.727 m)  Wt 205 lb (92.987 kg)  BMI 31.18 kg/m2 Well nourished, well developed, in no acute distress HEENT: normal Neck: no JVD Cardiac:  normal S1, S2; RRR; no murmur Lungs:  clear to auscultation  bilaterally, no wheezing, rhonchi or rales Abd: soft, nontender, no hepatomegaly Ext: no edema Skin: warm and dry Neuro:  CNs 2-12 intact, no focal abnormalities noted  EKG:    sinus bradycardia with LBBB - unchanged from prior EKG  ASSESSMENT AND PLAN:  1. ASCAD s/p remote CABG and subsequent PCI - continue ASA  2. Chronic LBBB 3. Dyslipidemia - LDL was 51 on labs 03/2013 - continue fish oil (statin intolerant)  - check FLP and ALT 4. HTN - ? White coat HTN - BP at his PCP earlier today was 157/33mmHg.  At home it runs 140/80-43mmHg.  I have asked him to check his BP twice daily for a week and call with the results.  Call if BP >180/178mMHg - continue Clonidine/Avapro/Lisinopril/Metoprolol/Hytrin  - I instructed him to increase his Terazosin 2mg  at bedtime.    Followup with me in 6 months    Signed, [name], MD [facility] 04/17/2014 3:10 PM

## 2014-04-17 NOTE — Patient Instructions (Signed)
Your physician has recommended you make the following change in your medication:  1) INCREASE your Hytrin to 2mg  NIGHTLY  Your physician wants you to follow-up in: 6 months with Dr. Radford Pax. You will receive a reminder letter in the mail two months in advance. If you don't receive a letter, please call our office to schedule the follow-up appointment.  Check BP twice daily and call [phone] with results.   Your physician recommends that you return for lab work in: 4 weeks. (FASTING LABS) ALT, LIPIDS

## 2014-04-20 ENCOUNTER — Other Ambulatory Visit: Payer: Self-pay

## 2014-04-20 ENCOUNTER — Telehealth: Payer: Self-pay | Admitting: Cardiology

## 2014-04-20 DIAGNOSIS — I10 Essential (primary) hypertension: Secondary | ICD-10-CM

## 2014-04-20 MED ORDER — HYDROCHLOROTHIAZIDE 12.5 MG PO CAPS
12.5000 mg | ORAL_CAPSULE | Freq: Every day | ORAL | Status: DC
Start: 2014-04-20 — End: 2014-04-25

## 2014-04-20 NOTE — Telephone Encounter (Signed)
Patient calling with recent BPs since office visit on 04/17/14 when Dr. Radford Pax INCREASED Hytrin to 2 mg qhs.  10/21: 218/108, HR=55 10/22AM: 174/113, HR=57 10/22PM: 187/116, HR=58 10/23: 170's/110's, HR=75.  Routing to Dr. Radford Pax and taking to Dr. Tamala Julian (DOD) for review and recommendations.

## 2014-04-20 NOTE — Telephone Encounter (Signed)
New message      Calling to give bp readings and have questions for the nurse

## 2014-04-20 NOTE — Telephone Encounter (Signed)
Spoke with DOD Dr. Tamala Julian.  Per Dr. Tamala Julian, instructed patient to START 12.5 mg HCTZ daily and made F/U appointment with Dayna Dunn 04/25/14 at 1345.  BMET ordered for 10/28.   Spoke with patient and his wife for clarification of orders. Both agree with treatment plan.

## 2014-04-25 ENCOUNTER — Other Ambulatory Visit (INDEPENDENT_AMBULATORY_CARE_PROVIDER_SITE_OTHER): Payer: Medicare Other | Admitting: *Deleted

## 2014-04-25 ENCOUNTER — Ambulatory Visit (INDEPENDENT_AMBULATORY_CARE_PROVIDER_SITE_OTHER): Payer: Medicare Other | Admitting: Physician Assistant

## 2014-04-25 ENCOUNTER — Encounter: Payer: Self-pay | Admitting: Physician Assistant

## 2014-04-25 VITALS — BP 150/92 | HR 62 | Ht 68.0 in | Wt 201.0 lb

## 2014-04-25 DIAGNOSIS — K279 Peptic ulcer, site unspecified, unspecified as acute or chronic, without hemorrhage or perforation: Secondary | ICD-10-CM | POA: Insufficient documentation

## 2014-04-25 DIAGNOSIS — K2970 Gastritis, unspecified, without bleeding: Secondary | ICD-10-CM | POA: Insufficient documentation

## 2014-04-25 DIAGNOSIS — N183 Chronic kidney disease, stage 3 unspecified: Secondary | ICD-10-CM

## 2014-04-25 DIAGNOSIS — I447 Left bundle-branch block, unspecified: Secondary | ICD-10-CM

## 2014-04-25 DIAGNOSIS — I10 Essential (primary) hypertension: Secondary | ICD-10-CM

## 2014-04-25 DIAGNOSIS — R0600 Dyspnea, unspecified: Secondary | ICD-10-CM

## 2014-04-25 DIAGNOSIS — E785 Hyperlipidemia, unspecified: Secondary | ICD-10-CM

## 2014-04-25 DIAGNOSIS — Z951 Presence of aortocoronary bypass graft: Secondary | ICD-10-CM | POA: Insufficient documentation

## 2014-04-25 DIAGNOSIS — I2510 Atherosclerotic heart disease of native coronary artery without angina pectoris: Secondary | ICD-10-CM

## 2014-04-25 DIAGNOSIS — I2584 Coronary atherosclerosis due to calcified coronary lesion: Secondary | ICD-10-CM

## 2014-04-25 DIAGNOSIS — I639 Cerebral infarction, unspecified: Secondary | ICD-10-CM | POA: Insufficient documentation

## 2014-04-25 LAB — BASIC METABOLIC PANEL
BUN: 17 mg/dL (ref 6–23)
CALCIUM: 9.3 mg/dL (ref 8.4–10.5)
CHLORIDE: 100 meq/L (ref 96–112)
CO2: 22 mEq/L (ref 19–32)
CREATININE: 1.5 mg/dL (ref 0.4–1.5)
GFR: 47.51 mL/min — ABNORMAL LOW (ref >60.00)
Glucose, Bld: 117 mg/dL — ABNORMAL HIGH (ref 70–99)
Potassium: 3.2 mEq/L — ABNORMAL LOW (ref 3.5–5.1)
Sodium: 136 mEq/L (ref 135–145)

## 2014-04-25 LAB — LIPID PANEL
Cholesterol: 160 mg/dL (ref 0–200)
HDL: 28.7 mg/dL — ABNORMAL LOW (ref >39.00)
NONHDL: 131.3
Total CHOL/HDL Ratio: 6
Triglycerides: 371 mg/dL — ABNORMAL HIGH (ref 0.0–149.0)
VLDL: 74.2 mg/dL — AB (ref 0.0–40.0)

## 2014-04-25 LAB — ALT: ALT: 20 U/L (ref 0–53)

## 2014-04-25 LAB — LDL CHOLESTEROL, DIRECT: LDL DIRECT: 45.7 mg/dL

## 2014-04-25 NOTE — Progress Notes (Signed)
Darryl [name], Darryl [name]  [address] Phone: [phone] Fax:  [phone]  Date:  04/25/2014   Patient ID:  Darryl [name] 02/01/1935, MRN [id number]   PCP:  Darryl Pac, MD  Cardiologist:  Dr. Radford [name]  History of Present Illness: Darryl [name] is a 79 y.o. male with history of CAD s/p multiple PCIs as below, HTN, dyslipidemia, prior PUD/gastritis, Parkinson's disease, intracranial hemorrhage who presents to clinic for follow-up.  He saw Dr. Radford [name] on 04/17/14 at which time he was doing well, but BP was elevated. She increased his terazosin. He has not previously tolerated increases in clonidine due to orthostasis-type symptoms. His HR in the 60's makes titration of beta blocker less desirable. His wife called in with increasing BPs in the [id number] systolic thus HCTZ 12.5mg  daily was added. He has been taking this at night. He denies any CP, near syncope, syncope, palpitations or edema. He does get rare intermittent dyspnea without any rhyme or reason - no orthopnea, and this doesn't necessarily always occur with exertion. This has not happened in the last few days. BP in office 150/92 with recheck by me 107/80 in the right arm, 102/75 in the left arm. He currently denies any complaints.  Recent Labs: 04/27/2013: Creatinine 1.34; Hemoglobin 15.2; Potassium 4.4   Wt Readings from Last 3 Encounters:  04/25/14 201 lb (91.173 kg)  04/17/14 205 lb (92.987 kg)  12/12/13 222 lb (100.699 kg)     Past Medical History  Diagnosis Date  . LBBB (left bundle branch block)   . Sleep disturbance   . Insomnia   . Tremor   . HTN (hypertension)     orthostatic  . Dyslipidemia   . Thrombocytopenia   . Colon polyp   . Diverticulosis   . Renal insufficiency     renal duplex scan in 2012 showed no renal artery stenosis  . Coronary atherosclerosis of native coronary artery     a. s/p CABG 11/2003. b. s/p DES to radial-OM and SVG-RCA 11/05. c. s/p BMS to LCx  11/2008.  . Lumbar spondylolysis     multilevel DJD with mild spinal stenosis L3-L5  . Headache(784.0)   . Arthritis   . Thrombocytopenia   . Elevated fasting glucose   . Gastritis   . Hiatal hernia   . Migraines   . H/O urinary retention     post surgical procedures  . Parkinson's disease   . Stroke     a. remote hx of stroke reportedly r/t HTN.  . Intracranial hemorrhage     a. small pontine [place] 2003.  2004 Peptic ulcer disease     a. prior hx of bleeding ulcers, including 12/2003 in setting of aspirin use.  . Gastritis     a. by EGD 04/2004.    Current Outpatient Prescriptions  Medication Sig Dispense Refill  . aspirin 81 MG tablet Take 81 mg by mouth daily.      . citalopram (CELEXA) 20 MG tablet Take 20 mg by mouth daily.      . cloNIDine (CATAPRES) 0.2 MG tablet Take 0.2 mg by mouth as directed. Takes 1 tablet in AM and 1.5 tablets in PM daily      . hydrochlorothiazide (MICROZIDE) 12.5 MG capsule Take 12.5 mg by mouth every morning.      . irbesartan (AVAPRO) 300 MG tablet Take 300 mg by mouth every morning.      . metoprolol (LOPRESSOR) 50 MG tablet Take 50  mg by mouth 2 (two) times daily.      . Omega-3 Fatty Acids (FISH OIL PO) Take 800 mg by mouth daily. Carlson's Liquid      . pantoprazole (PROTONIX) 40 MG tablet Take 40 mg by mouth every morning.      . tamsulosin (FLOMAX) 0.4 MG CAPS capsule Take 1 capsule (0.4 mg total) by mouth daily.  40 capsule  0  . terazosin (HYTRIN) 1 MG capsule Take 2 capsules (2 mg total) by mouth every evening.  60 capsule  6  . traMADol (ULTRAM) 50 MG tablet Take 50 mg by mouth daily as needed.        No current facility-administered medications for this visit.    Allergies:   Aspirin; Celebrex; Nsaids; Toprol xl; Altace; Levothyroxine; Biaxin; and Tetracyclines & related   Social History:  The patient  reports that he quit smoking about 55 years ago. His smoking use included Cigarettes. He has a 10 pack-year smoking history. He has never  used smokeless tobacco. He reports that he does not drink alcohol or use illicit drugs.   Family History:  The patient's family history includes CAD in his sister; CAD (age of onset: 15) in his father.   ROS:  Please see the history of present illness.     All other systems reviewed and negative.   PHYSICAL EXAM:  VS:  BP 150/92  Pulse 62  Ht 5\' 8"  (1.727 m)  Wt 201 lb (91.173 kg)  BMI 30.57 kg/m2 - recheck BP is 107/80 in the right arm, 102/75 in the left arm  Well nourished, well developed WM, in no acute distress HEENT: normal Neck: no JVD Cardiac:  normal S1, S2; RRR; no murmur Lungs:  clear to auscultation bilaterally, no wheezing, rhonchi or rales Abd: soft, nontender, no hepatomegaly Ext: no edema Skin: warm and dry Neuro:  moves all extremities spontaneously, no focal abnormalities noted, no baseline tremor  EKG:  NSR 62bpm LBBB, left axis deviation - no significant change  ASSESSMENT AND PLAN:  1. HTN - initial BP walking in the door was elevated, but recheck by me was on the softer side in low 100's in both arms.  I'm not sure if maybe his BP was lower since he was sitting on the exam table with his legs hanging off. Given these findings as well as his history of Parkinson's, will continue current regimen. Will check baseline echocardiogram to assess EF/LVH as this would guide choice of future agents. BMET pending from today. He was also taking HCTZ at night - since BP's at home have been running higher, will ask him to move this to the morning. His wife will check BPs daily over the next 5 days and call us with readings. 2. CAD s/p multiple PCIs - no recent angina. Continue aspirin and beta blocker. 3. Dyslipidemia - he is statin intolerant. Continue fish oil. Labs drawn today and pending. 4. LBBB - chronic. 5. CKD stage III - await BMET result.   Dispo: F/u 1 month with Dr. Radford [name] or myself for reassessment.  Signed, Darryl Copa, PA-C  04/25/2014 2:32 PM

## 2014-04-25 NOTE — Patient Instructions (Signed)
Your physician recommends that you continue on your current medications as directed. Please refer to the Current Medication list given to you today.    Your physician has requested that you have an echocardiogram. Echocardiography is a painless test that uses sound waves to create images of your heart. It provides your doctor with information about the size and shape of your heart and how well your heart's chambers and valves are working. This procedure takes approximately one hour. There are no restrictions for this procedure.    Your physician recommends that you return for lab work in [place] IN 5 DAYS

## 2014-04-26 ENCOUNTER — Telehealth: Payer: Self-pay

## 2014-04-26 NOTE — Telephone Encounter (Signed)
Gave patient recent lab results and Dr. Theodosia Blender recommendations.  Patient is annoyed and said he does not believe our testing is correct. Patient refuses to be referred to Lipid Clinic.

## 2014-04-26 NOTE — Telephone Encounter (Signed)
Message copied by Theodoro Parma on Thu Apr 26, 2014  5:41 PM ------      Message from: [name] R      Created: Thu Apr 26, 2014  1:30 PM       Lipids still not at goal.  Please forward to lipid clinic for further recommendations. ------

## 2014-05-01 ENCOUNTER — Ambulatory Visit (HOSPITAL_COMMUNITY): Payer: Medicare Other | Attending: Cardiology | Admitting: Cardiology

## 2014-05-01 ENCOUNTER — Other Ambulatory Visit: Payer: Self-pay

## 2014-05-01 ENCOUNTER — Telehealth: Payer: Self-pay

## 2014-05-01 DIAGNOSIS — I2510 Atherosclerotic heart disease of native coronary artery without angina pectoris: Secondary | ICD-10-CM | POA: Diagnosis not present

## 2014-05-01 DIAGNOSIS — I10 Essential (primary) hypertension: Secondary | ICD-10-CM | POA: Insufficient documentation

## 2014-05-01 NOTE — Telephone Encounter (Signed)
Patient in for ECHO and gave list of BP readings to technician. 10/21@1900 : BP 218/108 HR 55 10/22            : BP 174/112 HR 58        187/116 HR 58 10/25@1106 : BP 156/102 HR 69 (had not taken meds) 10/27@1233 : BP 150/105 HR 57 10/28@1400 : BP 150/92 then at rest 110/82 (this was taken at Dr.'s office) 10/29              BP 137/93 HR 62 10/31@1500 : BP 154/108 HR 54 11/1@1210 : BP 134/92 HR 58 (took meds at 1000)  Patient also requesting refills for terazosin and HCTZ. Done.   Routing to Dr. Radford Pax for review.

## 2014-05-01 NOTE — Progress Notes (Signed)
Echo performed. 

## 2014-05-01 NOTE — Telephone Encounter (Signed)
OK to refill Terazosin and HCTZ.  I would like him to come in for a BP check with the nurse and bring in his BP cuff to compare.  I am suspicious that his cuff may be reading high since his BP was normal at MDs office

## 2014-05-02 ENCOUNTER — Other Ambulatory Visit: Payer: Self-pay

## 2014-05-02 DIAGNOSIS — I10 Essential (primary) hypertension: Secondary | ICD-10-CM

## 2014-05-02 MED ORDER — HYDROCHLOROTHIAZIDE 12.5 MG PO CAPS: 12.5000 mg | ORAL_CAPSULE | ORAL | Status: DC

## 2014-05-02 MED ORDER — POTASSIUM CHLORIDE CRYS ER 20 MEQ PO TBCR: 40.0000 meq | EXTENDED_RELEASE_TABLET | Freq: Every day | ORAL | Status: DC

## 2014-05-02 MED ORDER — TERAZOSIN HCL 2 MG PO CAPS: 2.0000 mg | ORAL_CAPSULE | Freq: Every evening | ORAL | Status: DC

## 2014-05-02 NOTE — Telephone Encounter (Signed)
Clarified that Hytrin refill is to be called in to [facility] and HCTZ in to [facility]. Patient is refusing to be referred to Lipid Clinic at this time. St they will correct diet and exercise on their own.  Informed an appointment needs to be made to check BP and compare cuff readings. Pt st he will call back to make appointment.

## 2014-05-03 ENCOUNTER — Telehealth: Payer: Self-pay

## 2014-05-03 ENCOUNTER — Encounter: Payer: Self-pay | Admitting: Physician Assistant

## 2014-05-03 NOTE — Telephone Encounter (Signed)
-----   Message from Sueanne Margarita, MD sent at 05/02/2014  3:14 PM EST ----- Potassium is low.  Please start [place] 7meq - take 2 tablets now and then 2 tablets 4 hours later today then starting tomorrow, take 2 tablets daily and recheck BMET on Friday 11/6

## 2014-05-03 NOTE — Telephone Encounter (Signed)
Late entry.  Informed patient's wife that potassium is low and new prescription is being sent to pharmacy. Instructed to take 2 tablets (40 mEq total) when they get the medication and then 40 mEq 4 hours later. Instructed to take 2 tablets daily after that.  BMET to be rechecked Monday, 11/9. Ms. Costabile says they cannot come Friday.

## 2014-05-04 ENCOUNTER — Telehealth: Payer: Self-pay | Admitting: *Deleted

## 2014-05-04 NOTE — Telephone Encounter (Signed)
Called pt w/echo results; pt asked for me to please call his wife [name] on [phone] w/results. I lmom for wife to rtn my call to go over echo results.

## 2014-05-04 NOTE — Telephone Encounter (Signed)
pt's wife cb and has been notified of pt's echo results, see previos note where pt gave verbal permission to s/w wife about results. Wife verbalized understanding to results given today.

## 2014-05-07 ENCOUNTER — Other Ambulatory Visit: Payer: Self-pay

## 2014-05-07 ENCOUNTER — Other Ambulatory Visit (INDEPENDENT_AMBULATORY_CARE_PROVIDER_SITE_OTHER): Payer: Medicare Other | Admitting: *Deleted

## 2014-05-07 DIAGNOSIS — I10 Essential (primary) hypertension: Secondary | ICD-10-CM | POA: Diagnosis not present

## 2014-05-07 DIAGNOSIS — R7989 Other specified abnormal findings of blood chemistry: Secondary | ICD-10-CM

## 2014-05-07 LAB — BASIC METABOLIC PANEL
BUN: 21 mg/dL (ref 6–23)
CALCIUM: 9.5 mg/dL (ref 8.4–10.5)
CO2: 24 mEq/L (ref 19–32)
Chloride: 104 mEq/L (ref 96–112)
Creatinine, Ser: 1.6 mg/dL — ABNORMAL HIGH (ref 0.4–1.5)
GFR: 46.45 mL/min — ABNORMAL LOW (ref >60.00)
GLUCOSE: 127 mg/dL — AB (ref 70–99)
Potassium: 4.2 mEq/L (ref 3.5–5.1)
SODIUM: 138 meq/L (ref 135–145)

## 2014-05-09 ENCOUNTER — Telehealth: Payer: Self-pay | Admitting: Cardiology

## 2014-05-09 NOTE — Telephone Encounter (Signed)
Confirmed with pt's wife that patient is not to take HCTZ or Kdur per BMET result notes on 11/9. Meds removed from med list.   Per patient's wife, moved lab appointment and nurse visit for BP check to November 17.   Darryl [name] agrees with treatment plan.

## 2014-05-09 NOTE — Telephone Encounter (Signed)
Follow up:    Pt's wife called and would like a call back she has questions about her husband medications please.

## 2014-05-14 ENCOUNTER — Other Ambulatory Visit: Payer: Self-pay

## 2014-05-15 ENCOUNTER — Ambulatory Visit (INDEPENDENT_AMBULATORY_CARE_PROVIDER_SITE_OTHER): Payer: Medicare Other | Admitting: *Deleted

## 2014-05-15 ENCOUNTER — Other Ambulatory Visit: Payer: Medicare Other

## 2014-05-15 ENCOUNTER — Other Ambulatory Visit (INDEPENDENT_AMBULATORY_CARE_PROVIDER_SITE_OTHER): Payer: Medicare Other | Admitting: *Deleted

## 2014-05-15 VITALS — BP 150/78 | HR 60 | Wt 201.5 lb

## 2014-05-15 DIAGNOSIS — R7989 Other specified abnormal findings of blood chemistry: Secondary | ICD-10-CM

## 2014-05-15 DIAGNOSIS — I10 Essential (primary) hypertension: Secondary | ICD-10-CM | POA: Diagnosis not present

## 2014-05-15 DIAGNOSIS — R748 Abnormal levels of other serum enzymes: Secondary | ICD-10-CM | POA: Diagnosis not present

## 2014-05-15 NOTE — Progress Notes (Addendum)
The patient was here today for a BP check and to calibrate his home BP monitor. Left arm - 150/78 (manuel) and Right arm- 142/82 (manuel). His BP is much easier to hear on the left arm. HR- 60. Per the patient's home BP monitor- SBP left arm- 145 HR- 52. Medications verified in EPIC. He has taken his medications this morning. The patient and his wife state they have not checked his BP in a week. He does not complain of any other symptoms. He has a frequent headache, which is not new for him. I advised the patient and his wife I would forward these readings to Dr. Radford Pax for review and we will notify them of any changes that need to be made based off today's readings. I have also encouraged them to continue to monitor the patient's BP readings at home and record those. I advised no more than twice daily and after his AM meds have been taken. The patient had a repeat BMP today. He is scheduled for follow up with Dr. Radford Pax on 05/29/14 at 8:45 am.   Please have patient check BP daily for a week 2 hours after taking am meds and call with results.  Signed: [name], MD [facility] 05/15/2014

## 2014-05-15 NOTE — Progress Notes (Signed)
The patient is aware of Dr. Theodosia Blender recommendations.

## 2014-05-16 LAB — BASIC METABOLIC PANEL
BUN: 21 mg/dL (ref 6–23)
CO2: 25 mEq/L (ref 19–32)
CREATININE: 1.6 mg/dL — AB (ref 0.4–1.5)
Calcium: 9.3 mg/dL (ref 8.4–10.5)
Chloride: 111 mEq/L (ref 96–112)
GFR: 46.44 mL/min — ABNORMAL LOW (ref >60.00)
Glucose, Bld: 134 mg/dL — ABNORMAL HIGH (ref 70–99)
POTASSIUM: 4.4 meq/L (ref 3.5–5.1)
Sodium: 144 mEq/L (ref 135–145)

## 2014-05-17 ENCOUNTER — Telehealth: Payer: Self-pay

## 2014-05-17 DIAGNOSIS — E780 Pure hypercholesterolemia: Secondary | ICD-10-CM | POA: Diagnosis not present

## 2014-05-17 DIAGNOSIS — Z86010 Personal history of colonic polyps: Secondary | ICD-10-CM | POA: Diagnosis not present

## 2014-05-17 DIAGNOSIS — F411 Generalized anxiety disorder: Secondary | ICD-10-CM | POA: Diagnosis not present

## 2014-05-17 DIAGNOSIS — N189 Chronic kidney disease, unspecified: Secondary | ICD-10-CM | POA: Diagnosis not present

## 2014-05-17 DIAGNOSIS — I10 Essential (primary) hypertension: Secondary | ICD-10-CM | POA: Diagnosis not present

## 2014-05-17 DIAGNOSIS — I447 Left bundle-branch block, unspecified: Secondary | ICD-10-CM | POA: Diagnosis not present

## 2014-05-17 DIAGNOSIS — E669 Obesity, unspecified: Secondary | ICD-10-CM | POA: Diagnosis not present

## 2014-05-17 DIAGNOSIS — I2510 Atherosclerotic heart disease of native coronary artery without angina pectoris: Secondary | ICD-10-CM | POA: Diagnosis not present

## 2014-05-17 NOTE — Telephone Encounter (Signed)
Requesting lab work. Faxed to [phone] Attn: Manuela Schwartz.

## 2014-05-18 NOTE — Telephone Encounter (Signed)
Follow up  ° ° ° °Returning call back to nurse  °

## 2014-05-22 ENCOUNTER — Other Ambulatory Visit: Payer: Self-pay

## 2014-05-22 DIAGNOSIS — N183 Chronic kidney disease, stage 3 (moderate): Secondary | ICD-10-CM

## 2014-05-22 DIAGNOSIS — I10 Essential (primary) hypertension: Secondary | ICD-10-CM

## 2014-05-29 ENCOUNTER — Encounter: Payer: Self-pay | Admitting: Cardiology

## 2014-05-29 ENCOUNTER — Ambulatory Visit (INDEPENDENT_AMBULATORY_CARE_PROVIDER_SITE_OTHER): Payer: Medicare Other | Admitting: Cardiology

## 2014-05-29 VITALS — BP 148/90 | HR 54 | Ht 68.0 in | Wt 207.4 lb

## 2014-05-29 DIAGNOSIS — I2584 Coronary atherosclerosis due to calcified coronary lesion: Secondary | ICD-10-CM | POA: Diagnosis not present

## 2014-05-29 DIAGNOSIS — E785 Hyperlipidemia, unspecified: Secondary | ICD-10-CM

## 2014-05-29 DIAGNOSIS — I2510 Atherosclerotic heart disease of native coronary artery without angina pectoris: Secondary | ICD-10-CM | POA: Diagnosis not present

## 2014-05-29 DIAGNOSIS — I119 Hypertensive heart disease without heart failure: Secondary | ICD-10-CM

## 2014-05-29 DIAGNOSIS — I77810 Thoracic aortic ectasia: Secondary | ICD-10-CM | POA: Diagnosis not present

## 2014-05-29 DIAGNOSIS — I10 Essential (primary) hypertension: Secondary | ICD-10-CM

## 2014-05-29 HISTORY — DX: Thoracic aortic ectasia: I77.810

## 2014-05-29 NOTE — Progress Notes (Signed)
[place], [address], [place]  [address] Phone: [phone] Fax:  [phone]  Date:  05/29/2014   ID:  Darryl [name], DOB [birth year], MRN [id number]  PCP:  Gennette Pac, MD  Cardiologist:  [name], MD    History of Present Illness: Darryl [name] is a 79 y.o. male with a history of CAD/HTN/chronic LBBB/dyslipidemia who presents today for followup of his HTN.  He denies any chest pain, LE edema, dizziness, palpitations or syncope. He has some DOE and it is intermittent and does not occur daily. He says it does not occur often. He walks for exercise but is limited by back problems. He will walk about 15 minutes daily.  2D echo showed normal LVF with severe LVH and mildly dilated aortic root.   Wt Readings from Last 3 Encounters:  05/29/14 207 lb 6.4 oz (94.076 kg)  05/15/14 201 lb 8 oz (91.4 kg)  04/25/14 201 lb (91.173 kg)     Past Medical History  Diagnosis Date  . LBBB (left bundle branch block)   . Sleep disturbance   . Insomnia   . Tremor   . HTN (hypertension)     orthostatic  . Dyslipidemia   . Thrombocytopenia   . Colon polyp   . Diverticulosis   . Renal insufficiency     renal duplex scan in 2012 showed no renal artery stenosis  . Coronary atherosclerosis of native coronary artery     a. s/p CABG 11/2003. b. s/p DES to radial-OM and SVG-RCA 11/05. c. s/p BMS to LCx 11/2008.  . Lumbar spondylolysis     multilevel DJD with mild spinal stenosis L3-L5  . Headache(784.0)   . Arthritis   . Thrombocytopenia   . Elevated fasting glucose   . Gastritis   . Hiatal hernia   . Migraines   . H/O urinary retention     post surgical procedures  . Parkinson's disease   . Stroke     a. remote hx of stroke reportedly r/t HTN.  . Intracranial hemorrhage     a. small pontine [place] 2003.  [name] Peptic ulcer disease     a. prior hx of bleeding ulcers, including 12/2003 in setting of aspirin use.  . Gastritis     a. by EGD 04/2004.  [name] Hypertensive  heart disease     Current Outpatient Prescriptions  Medication Sig Dispense Refill  . aspirin 81 MG tablet Take one tablet (81 mg) by mouth daily on Monday, Wednesday, & fridays    . citalopram (CELEXA) 20 MG tablet Take 20 mg by mouth daily.    . cloNIDine (CATAPRES) 0.2 MG tablet Take 0.2 mg by mouth as directed. Takes 1 tablet in AM and 1.5 tablets in PM daily    . hydrochlorothiazide (MICROZIDE) 12.5 MG capsule     . irbesartan (AVAPRO) 300 MG tablet Take 300 mg by mouth every morning.    . metoprolol (LOPRESSOR) 50 MG tablet Take 50 mg by mouth 2 (two) times daily.    . Omega-3 Fatty Acids (FISH OIL PO) Take 800 mg by mouth daily. Carlson's Liquid    . pantoprazole (PROTONIX) 40 MG tablet Take 40 mg by mouth every morning.    . potassium chloride SA (K-DUR,KLOR-CON) 20 MEQ tablet     . tamsulosin (FLOMAX) 0.4 MG CAPS capsule Take 1 capsule (0.4 mg total) by mouth daily. 40 capsule 0  . terazosin (HYTRIN) 2 MG capsule Take 1 capsule (2 mg total) by  mouth every evening. 90 capsule 3  . traMADol (ULTRAM) 50 MG tablet Take 50 mg by mouth daily as needed.      No current facility-administered medications for this visit.    Allergies:    Allergies  Allergen Reactions  . Aspirin Other (See Comments)    ulcers  . Celebrex [Celecoxib] Other (See Comments)    Due to ulcer.  . Nsaids Other (See Comments)    Due to ulcer.  . Toprol Xl [Metoprolol Tartrate] Swelling  . Altace [Ramipril]   . Levothyroxine Other (See Comments)    "made him feel bad"  . Biaxin [Clarithromycin] Nausea Only  . Tetracyclines & Related Nausea Only    Social History:  The patient  reports that he quit smoking about 55 years ago. His smoking use included Cigarettes. He has a 10 pack-year smoking history. He has never used smokeless tobacco. He reports that he does not drink alcohol or use illicit drugs.   Family History:  The patient's family history includes CAD in his sister; CAD (age of onset: 2) in his  father.   ROS:  Please see the history of present illness.      All other systems reviewed and negative.   PHYSICAL EXAM: VS:  BP 148/90 mmHg  Pulse 54  Ht 5\' 8"  (1.727 m)  Wt 207 lb 6.4 oz (94.076 kg)  BMI 31.54 kg/m2  SpO2 97% Well nourished, well developed, in no acute distress HEENT: normal Neck: no JVD Cardiac:  normal S1, S2; RRR; no murmur Lungs:  clear to auscultation bilaterally, no wheezing, rhonchi or rales Abd: soft, nontender, no hepatomegaly Ext: no edema Skin: warm and dry Neuro:  CNs 2-12 intact, no focal abnormalities noted  EKG:       ASSESSMENT AND PLAN:  1. ASCAD s/p remote CABG and subsequent PCI - continue ASA  2. Chronic LBBB 3. Dyslipidemia - LDL was 45 on labs 03/2014.  TAG were elevated - continue fish oil (statin intolerant)        4.  HTN - ? White coat HTN - BP at home was 130/53mmHg - continue Clonidine/Avapro/Lisinopril/Metoprolol/Hytrin  - I instructed him to check his BP daily for a week and call with the results.  Given his LVH and dilated aortic root we need to be aggressive at treating his HTn        5.  Mildly dilated aortic root - recheck echo in 1 year  Followup with me in 6 months   Signed, [name], MD [facility] 05/29/2014 8:58 AM

## 2014-05-29 NOTE — Patient Instructions (Addendum)
Please check your blood pressure daily for one week and call [phone] with results.   Your physician wants you to follow-up in: 6 months with Dr. Radford Pax. You will receive a reminder letter in the mail two months in advance. If you don't receive a letter, please call our office to schedule the follow-up appointment.

## 2014-06-12 ENCOUNTER — Encounter: Payer: Self-pay | Admitting: Neurology

## 2014-06-12 ENCOUNTER — Ambulatory Visit (INDEPENDENT_AMBULATORY_CARE_PROVIDER_SITE_OTHER): Payer: Medicare Other | Admitting: Neurology

## 2014-06-12 VITALS — BP 147/79 | HR 58 | Ht 68.0 in | Wt 208.2 lb

## 2014-06-12 DIAGNOSIS — G20 Parkinson's disease: Secondary | ICD-10-CM

## 2014-06-12 DIAGNOSIS — I2584 Coronary atherosclerosis due to calcified coronary lesion: Secondary | ICD-10-CM

## 2014-06-12 DIAGNOSIS — G20C Parkinsonism, unspecified: Secondary | ICD-10-CM | POA: Insufficient documentation

## 2014-06-12 DIAGNOSIS — I2510 Atherosclerotic heart disease of native coronary artery without angina pectoris: Secondary | ICD-10-CM | POA: Diagnosis not present

## 2014-06-12 NOTE — Progress Notes (Signed)
[facility]    Provider:  Dr Jaynee Eagles Referring Provider: Hulan Fess, MD Primary Care Physician:  Gennette Pac, MD  CC: tremor  Tremor is stable. Sometimes you can see it and other times it is not there. No problems eating, dressing. Right is worse so needs to use left hand. Right >> left. Handwriting is getting worse. Walking is getting a little slower. Has been working with the cardiologist for tight blood pressure control. No muscle stiffness. No falls. Not using a walking aid. No REM sleep disorder. Sense of smell is fine. Has constipation since starting the celexa for anxiety. No orthostatic dizzines.   12/12/2013 Dr. Janann Colonel: Darryl [name] is a 79 y.o. male here as a follow up from Dr. Rex Kras for tremor evaluation. Last visit was 08/2013 at which time there was concern over possible amyloid angiopathy. He was evaluated by Dr Leonie Man with findings likely related to chronic HTN/small vessel disease. His ASA 81mg  was changed to every other day due to bruising. Overall doing well. Continues to have difficulties with his blood pressure. Currently working with Dr [name] (cardiology).   The tremor is stable, no worsening, remains stable. He currently reports some pain radiating from lower back down the posterior surface of his right leg. He has a history of lumbar disc herniation s/p surgery.    Initial visit 07/2013 Dr. Janann Colonel: First noticed tremor around 3 years ago, started on right but now involves both hands but is R>L. Described as both a rest and action/postural tremor. Bothers him the most when trying to eat. Handwriting has gotten messier, no change in size. Wife notes some generalized slowing down, some shuffling of his feet. No muscle stiffness or cramps. He does not some gait instability, has some trouble getting in and out of the car. Feels very unsteady going down stairs. Has not noticed anything that makes the tremor worse. No intake of caffeine or  EtOH. Sleeps well, wife notes some REM behavior disorder (talking in his sleep). No known difficulty with sense of smell. No known stroke or TIA, has had CAD with CABG x 4.   He thinks his father may have had a tremor.   Reviewed notes, labs and imaging from outside physicians, which showed: MRI of the brain 07/2013: Abnormal MRI scan of the brain showing extensive changes of chronic microvascular disease and scattered multiple microhemorrhages of remote age. Possibilities include amyloid angiopathy versus small vessel disease.  Review of Systems: Patient complains of symptoms per HPI as well as the following symptoms: ringing in ears, back pain, tremors, anxiety. Pertinent negatives per HPI. All others negative.   History   Social History  . Marital Status: Married    Spouse Name: [name]    Number of Children: 4  . Years of Education: college   Occupational History  . Not on file.   Social History Main Topics  . Smoking status: Former Smoker -- 2.00 packs/day for 5 years    Types: Cigarettes    Quit date: 04/28/1959  . Smokeless tobacco: Never Used     Comment: 55+ yrs ago  . Alcohol Use: No  . Drug Use: No  . Sexual Activity: Not on file   Other Topics Concern  . Not on file   Social History Narrative   Patient is married to [name]), has 4 children   Patient is right handed   Education level is college   Caffeine consumption is none    Family History  Problem Relation  Age of Onset  . CAD Father 50    ASCAD  . CAD Sister     Past Medical History  Diagnosis Date  . LBBB (left bundle branch block)   . Sleep disturbance   . Insomnia   . Tremor   . HTN (hypertension)     orthostatic  . Dyslipidemia   . Thrombocytopenia   . Colon polyp   . Diverticulosis   . Renal insufficiency     renal duplex scan in 2012 showed no renal artery stenosis  . Coronary atherosclerosis of native coronary artery     a. s/p CABG 11/2003. b. s/p DES to radial-OM and SVG-RCA 11/05.  c. s/p BMS to LCx 11/2008.  . Lumbar spondylolysis     multilevel DJD with mild spinal stenosis L3-L5  . Headache(784.0)   . Arthritis   . Thrombocytopenia   . Elevated fasting glucose   . Gastritis   . Hiatal hernia   . Migraines   . H/O urinary retention     post surgical procedures  . Parkinson's disease   . Stroke     a. remote hx of stroke reportedly r/t HTN.  . Intracranial hemorrhage     a. small pontine [place] 2003.  [name] Peptic ulcer disease     a. prior hx of bleeding ulcers, including 12/2003 in setting of aspirin use.  . Gastritis     a. by EGD 04/2004.  [name] Hypertensive heart disease   . Dilated aortic root 05/29/2014    Past Surgical History  Procedure Laterality Date  . Wisdom tooth extraction    . Esophagogastroduodenoscopy      10/2001, 12/2003, 04/2004  . Coronary artery bypass graft  2005  . Shoulder arthroscopy Left 9/10  . Appendectomy  62    1963  . Stones      hx  . Coronary angioplasty  6/10,10/05  . Cholecystectomy  1998  . Lumbar laminectomy/decompression microdiscectomy N/A 05/03/2013    Procedure: LUMBAR LAMINECTOMY/DCOMPRESSION MICRODISCECTOMY RIGHT LUMBAR FOUR FIVE;  Surgeon: [name], MD;  Location: [facility];  Service: Neurosurgery;  Laterality: N/A;  . Colonoscopy  10/2001  . Colon surgery  03/14/2008    Current Outpatient Prescriptions  Medication Sig Dispense Refill  . aspirin 81 MG tablet Take one tablet (81 mg) by mouth daily on Monday, Wednesday, & fridays    . citalopram (CELEXA) 20 MG tablet Take 20 mg by mouth daily.    . cloNIDine (CATAPRES) 0.2 MG tablet Take 0.2 mg by mouth as directed. Takes 1 tablet in AM and 1.5 tablets in PM daily    . irbesartan (AVAPRO) 300 MG tablet Take 300 mg by mouth every morning.    . metoprolol (LOPRESSOR) 50 MG tablet Take 50 mg by mouth 2 (two) times daily.    . Omega-3 Fatty Acids (FISH OIL PO) Take 800 mg by mouth daily. Carlson's Liquid    . pantoprazole (PROTONIX) 40 MG tablet Take 40 mg by  mouth every morning.    . tamsulosin (FLOMAX) 0.4 MG CAPS capsule Take 1 capsule (0.4 mg total) by mouth daily. 40 capsule 0  . terazosin (HYTRIN) 2 MG capsule Take 1 capsule (2 mg total) by mouth every evening. 90 capsule 3  . traMADol (ULTRAM) 50 MG tablet Take 50 mg by mouth daily as needed.      No current facility-administered medications for this visit.    Allergies as of 06/12/2014 - Review Complete 06/12/2014  Allergen Reaction Noted  .  Aspirin Other (See Comments) 04/27/2013  . Celebrex [celecoxib] Other (See Comments) 04/10/2013  . Nsaids Other (See Comments) 04/10/2013  . Toprol xl [metoprolol tartrate] Swelling 04/10/2013  . Altace [ramipril]  04/10/2013  . Levothyroxine Other (See Comments) 08/11/2013  . Biaxin [clarithromycin] Nausea Only 04/10/2013  . Tetracyclines & related Nausea Only 04/10/2013    Vitals: BP 147/79 mmHg  Pulse 58  Ht 5\' 8"  (1.727 m)  Wt 208 lb 3.2 oz (94.439 kg)  BMI 31.66 kg/m2 Last Weight:  Wt Readings from Last 1 Encounters:  06/12/14 208 lb 3.2 oz (94.439 kg)   Last Height:   Ht Readings from Last 1 Encounters:  06/12/14 5\' 8"  (1.727 m)   Physical exam: Exam: Gen: NAD, conversant, well nourised, obese, well groomed                     CV: RRR, no MRG. No Carotid Bruits. No peripheral edema, warm, nontender Eyes: Conjunctivae clear without exudates or hemorrhage  Neuro: Detailed Neurologic Exam  Speech:    Speech is normal; fluent and spontaneous with normal comprehension.  Cognition:    The patient is oriented to person, place, and time;     recent and remote memory intact;     language fluent;     normal attention, concentration,     fund of knowledge Cranial Nerves:    The pupils are equal, round, and reactive to light. The fundi are normal and spontaneous venous pulsations are present. Visual fields are full to finger confrontation. Extraocular movements are intact. Trigeminal sensation is intact and the muscles of  mastication are normal. The face is symmetric. The palate elevates in the midline. Voice is normal. Shoulder shrug is normal. The tongue has normal motion without fasciculations.   Coordination: Mild bradykinesia noted with finger tapping left greater than right, hand opening closing left greater than right and rapid alternating movement. Bradykinesia with tapping of left foot, right within normal limits.  Gait:    decreased arm swing on the left, minimal shuffling of steps, no re emergent tremor in the right, Romberg negative, multiple steps back with pull test   Motor Observation:    Minimal rest tremor noted right hand greater than left, bilateral intention and postural tremor Tone:    Mild cogwheeling bilateral wrists  Posture:    Posture is normal. normal erect    Strength:    Strength is V/V in the upper and lower limbs.      Sensation: intact to LT     Reflex Exam:  DTR's:    Deep tendon reflexes in the upper and lower extremities are normal bilaterally.   Toes:    The toes are downgoing bilaterally.   Clonus:    Clonus is absent.       Assessment/Plan:  79 year old gentleman presenting for follow up evaluation of tremor, which is been ongoing for multiple years and is getting progressively worse. Symptoms c/w parkinsonism, likely idiopathic but vascular PD also possible.He describes both a rest and action component, he feels action component is causing the most difficulty. MRI brain reviewed showed multiple micro-hemorrhages. Was evaluated by Dr Leonie Man who suggested aggressive BP control of <130/90. Currently working with Dr [name] to manage BP. Will hold off on starting medication to treat tremor at this time, patient feels his symptoms are manageable and doesn't want to try medications. He does look better than was documented by Dr. Janann Colonel at last appointment. He was concerned about his blood  pressure, but I reassured him that 147/79 likely will not cause a hemorrhagic  infarct or cause him to have more microhemorrhages (possibly due to amyloid angiopathy). However he does have extensive changes of chronic microvascular disease and managing blood pressure and other vascular risk factors is important.  Will follow up in 6 months or earlier if needed.    Sarina Ill, MD  [facility] [address] [place], [place] [address]  Phone [phone] Fax [phone]

## 2014-06-13 ENCOUNTER — Ambulatory Visit: Payer: Medicare Other | Admitting: Neurology

## 2014-06-14 ENCOUNTER — Ambulatory Visit: Payer: Medicare Other | Admitting: Neurology

## 2014-07-18 DIAGNOSIS — M47817 Spondylosis without myelopathy or radiculopathy, lumbosacral region: Secondary | ICD-10-CM | POA: Diagnosis not present

## 2014-08-02 DIAGNOSIS — R972 Elevated prostate specific antigen [PSA]: Secondary | ICD-10-CM | POA: Diagnosis not present

## 2014-08-02 DIAGNOSIS — N402 Nodular prostate without lower urinary tract symptoms: Secondary | ICD-10-CM | POA: Diagnosis not present

## 2014-08-07 ENCOUNTER — Telehealth: Payer: Self-pay | Admitting: Cardiology

## 2014-08-07 DIAGNOSIS — N189 Chronic kidney disease, unspecified: Secondary | ICD-10-CM | POA: Diagnosis not present

## 2014-08-07 DIAGNOSIS — E669 Obesity, unspecified: Secondary | ICD-10-CM | POA: Diagnosis not present

## 2014-08-07 DIAGNOSIS — Z0000 Encounter for general adult medical examination without abnormal findings: Secondary | ICD-10-CM | POA: Diagnosis not present

## 2014-08-07 DIAGNOSIS — F411 Generalized anxiety disorder: Secondary | ICD-10-CM | POA: Diagnosis not present

## 2014-08-07 DIAGNOSIS — I10 Essential (primary) hypertension: Secondary | ICD-10-CM | POA: Diagnosis not present

## 2014-08-07 DIAGNOSIS — E780 Pure hypercholesterolemia: Secondary | ICD-10-CM | POA: Diagnosis not present

## 2014-08-07 DIAGNOSIS — R251 Tremor, unspecified: Secondary | ICD-10-CM | POA: Diagnosis not present

## 2014-08-07 DIAGNOSIS — I2510 Atherosclerotic heart disease of native coronary artery without angina pectoris: Secondary | ICD-10-CM | POA: Diagnosis not present

## 2014-08-07 DIAGNOSIS — Z86010 Personal history of colonic polyps: Secondary | ICD-10-CM | POA: Diagnosis not present

## 2014-08-07 DIAGNOSIS — I447 Left bundle-branch block, unspecified: Secondary | ICD-10-CM | POA: Diagnosis not present

## 2014-08-07 DIAGNOSIS — M4727 Other spondylosis with radiculopathy, lumbosacral region: Secondary | ICD-10-CM | POA: Diagnosis not present

## 2014-08-07 DIAGNOSIS — N402 Nodular prostate without lower urinary tract symptoms: Secondary | ICD-10-CM | POA: Diagnosis not present

## 2014-08-07 NOTE — Telephone Encounter (Signed)
Spoke with Manuela Schwartz at Dr. Ammie Ferrier office, pts PCP, she is looking for [place], said that it would have been about 10 years ago. Let her know that oldest results I see is from 2006 and none of them are Abdominal US from what I can tell.  She was going to talk to Dr. Sabra Heck and see how she wanted to proceed from this point.

## 2014-08-07 NOTE — Telephone Encounter (Signed)
Left message with Manuela Schwartz at Dr. Sanjuan Dame office to find out more information regarding what she needs for pt.

## 2014-08-07 NOTE — Telephone Encounter (Signed)
New Prob   Calling to see if we have record of an abdominal ultrasound for aortic aneurysm done possibly several years ago. Please call.

## 2014-08-08 ENCOUNTER — Telehealth: Payer: Self-pay | Admitting: Cardiology

## 2014-08-08 NOTE — Telephone Encounter (Signed)
New message      Has pt ever had an aortic aneurysm test?

## 2014-08-08 NOTE — Telephone Encounter (Addendum)
Yesterday spoke with Dr. Sanjuan Dame nurse Manuela Schwartz she was thinking that test was done years ago and my need a new [place] of abdomen.  Pt wife [name] called today stating that he had Echo done in 04/2014 and that tech viewed abdomen during that test but unsure if put in report.  Told wife I will contact Manuela Schwartz to see if they have medical release on file for Mr. Flannery and will send results once we have proper paperwork. Instructed [name] that Dr. Ammie Ferrier office will call her if the paperwork is needed in order for [place] to release information.  Dr. Rex Kras listed as pt PCP routed to Dr. Rex Kras, Dr. Ammie Ferrier nurse made aware.

## 2014-08-09 ENCOUNTER — Other Ambulatory Visit: Payer: Self-pay | Admitting: Family Medicine

## 2014-08-09 DIAGNOSIS — Z136 Encounter for screening for cardiovascular disorders: Secondary | ICD-10-CM

## 2014-08-09 DIAGNOSIS — I10 Essential (primary) hypertension: Secondary | ICD-10-CM

## 2014-08-09 DIAGNOSIS — Z87891 Personal history of nicotine dependence: Secondary | ICD-10-CM

## 2014-08-21 ENCOUNTER — Ambulatory Visit: Payer: Medicare Other

## 2014-08-21 ENCOUNTER — Ambulatory Visit
Admission: RE | Admit: 2014-08-21 | Discharge: 2014-08-21 | Disposition: A | Discharge status: Home / Self Care | Payer: Medicare Other | Source: Ambulatory Visit | Attending: Family Medicine | Admitting: Family Medicine

## 2014-08-21 DIAGNOSIS — Z136 Encounter for screening for cardiovascular disorders: Secondary | ICD-10-CM

## 2014-08-21 DIAGNOSIS — I10 Essential (primary) hypertension: Secondary | ICD-10-CM

## 2014-08-21 DIAGNOSIS — Z87891 Personal history of nicotine dependence: Secondary | ICD-10-CM

## 2014-08-21 DIAGNOSIS — I77819 Aortic ectasia, unspecified site: Secondary | ICD-10-CM | POA: Diagnosis not present

## 2014-08-23 ENCOUNTER — Other Ambulatory Visit: Payer: Self-pay | Admitting: Family Medicine

## 2014-08-23 ENCOUNTER — Encounter: Payer: Self-pay | Admitting: Cardiology

## 2014-08-23 ENCOUNTER — Ambulatory Visit
Admission: RE | Admit: 2014-08-23 | Discharge: 2014-08-23 | Disposition: A | Discharge status: Home / Self Care | Payer: Medicare Other | Source: Ambulatory Visit | Attending: Family Medicine | Admitting: Family Medicine

## 2014-08-23 DIAGNOSIS — R55 Syncope and collapse: Secondary | ICD-10-CM | POA: Diagnosis not present

## 2014-08-23 DIAGNOSIS — M79651 Pain in right thigh: Secondary | ICD-10-CM

## 2014-08-23 DIAGNOSIS — S79911A Unspecified injury of right hip, initial encounter: Secondary | ICD-10-CM | POA: Diagnosis not present

## 2014-08-23 DIAGNOSIS — M79604 Pain in right leg: Secondary | ICD-10-CM | POA: Diagnosis not present

## 2014-09-04 DIAGNOSIS — R972 Elevated prostate specific antigen [PSA]: Secondary | ICD-10-CM | POA: Diagnosis not present

## 2014-09-26 DIAGNOSIS — M5416 Radiculopathy, lumbar region: Secondary | ICD-10-CM | POA: Diagnosis not present

## 2014-09-26 DIAGNOSIS — M4727 Other spondylosis with radiculopathy, lumbosacral region: Secondary | ICD-10-CM | POA: Diagnosis not present

## 2014-09-30 ENCOUNTER — Emergency Department (HOSPITAL_COMMUNITY)
Admission: EM | Admit: 2014-09-30 | Discharge: 2014-09-30 | Disposition: A | Discharge status: Home / Self Care | Payer: Medicare Other | Source: Home / Self Care | Attending: Emergency Medicine | Admitting: Emergency Medicine

## 2014-09-30 ENCOUNTER — Encounter (HOSPITAL_COMMUNITY): Payer: Self-pay | Admitting: Emergency Medicine

## 2014-09-30 ENCOUNTER — Emergency Department (HOSPITAL_COMMUNITY): Payer: Medicare Other

## 2014-09-30 DIAGNOSIS — M5126 Other intervertebral disc displacement, lumbar region: Secondary | ICD-10-CM | POA: Diagnosis not present

## 2014-09-30 DIAGNOSIS — G20 Parkinson's disease: Secondary | ICD-10-CM | POA: Diagnosis not present

## 2014-09-30 DIAGNOSIS — I2510 Atherosclerotic heart disease of native coronary artery without angina pectoris: Secondary | ICD-10-CM | POA: Diagnosis not present

## 2014-09-30 DIAGNOSIS — A419 Sepsis, unspecified organism: Secondary | ICD-10-CM | POA: Diagnosis not present

## 2014-09-30 DIAGNOSIS — R0602 Shortness of breath: Secondary | ICD-10-CM | POA: Diagnosis not present

## 2014-09-30 DIAGNOSIS — R112 Nausea with vomiting, unspecified: Secondary | ICD-10-CM | POA: Diagnosis not present

## 2014-09-30 DIAGNOSIS — R339 Retention of urine, unspecified: Secondary | ICD-10-CM

## 2014-09-30 DIAGNOSIS — D696 Thrombocytopenia, unspecified: Secondary | ICD-10-CM | POA: Diagnosis not present

## 2014-09-30 DIAGNOSIS — N390 Urinary tract infection, site not specified: Secondary | ICD-10-CM | POA: Diagnosis not present

## 2014-09-30 DIAGNOSIS — M5127 Other intervertebral disc displacement, lumbosacral region: Secondary | ICD-10-CM | POA: Diagnosis not present

## 2014-09-30 DIAGNOSIS — N183 Chronic kidney disease, stage 3 (moderate): Secondary | ICD-10-CM | POA: Diagnosis not present

## 2014-09-30 LAB — I-STAT CHEM 8, ED
BUN: 21 mg/dL (ref 6–23)
CALCIUM ION: 1.22 mmol/L (ref 1.13–1.30)
CHLORIDE: 101 mmol/L (ref 96–112)
CREATININE: 1.2 mg/dL (ref 0.50–1.35)
Glucose, Bld: 159 mg/dL — ABNORMAL HIGH (ref 70–99)
HCT: 48 % (ref 39.0–52.0)
Hemoglobin: 16.3 g/dL (ref 13.0–17.0)
POTASSIUM: 3.6 mmol/L (ref 3.5–5.1)
SODIUM: 138 mmol/L (ref 135–145)
TCO2: 22 mmol/L (ref 0–100)

## 2014-09-30 LAB — URINALYSIS, ROUTINE W REFLEX MICROSCOPIC
BILIRUBIN URINE: NEGATIVE
Glucose, UA: NEGATIVE mg/dL
Ketones, ur: NEGATIVE mg/dL
Leukocytes, UA: NEGATIVE
NITRITE: NEGATIVE
Protein, ur: NEGATIVE mg/dL
SPECIFIC GRAVITY, URINE: 1.01 (ref 1.005–1.030)
UROBILINOGEN UA: 0.2 mg/dL (ref 0.0–1.0)
pH: 6 (ref 5.0–8.0)

## 2014-09-30 LAB — CBC WITH DIFFERENTIAL/PLATELET
BASOS ABS: 0 10*3/uL (ref 0.0–0.1)
BASOS PCT: 0 % (ref 0–1)
EOS PCT: 1 % (ref 0–5)
Eosinophils Absolute: 0.1 10*3/uL (ref 0.0–0.7)
HCT: 45.6 % (ref 39.0–52.0)
Hemoglobin: 16.8 g/dL (ref 13.0–17.0)
LYMPHS PCT: 11 % — AB (ref 12–46)
Lymphs Abs: 1.6 10*3/uL (ref 0.7–4.0)
MCH: 32.3 pg (ref 26.0–34.0)
MCHC: 36.8 g/dL — ABNORMAL HIGH (ref 30.0–36.0)
MCV: 87.7 fL (ref 78.0–100.0)
MONOS PCT: 5 % (ref 3–12)
Monocytes Absolute: 0.7 10*3/uL (ref 0.1–1.0)
NEUTROS ABS: 12 10*3/uL — AB (ref 1.7–7.7)
Neutrophils Relative %: 83 % — ABNORMAL HIGH (ref 43–77)
PLATELETS: UNDETERMINED 10*3/uL (ref 150–400)
RBC: 5.2 MIL/uL (ref 4.22–5.81)
RDW: 13.8 % (ref 11.5–15.5)
WBC: 14.4 10*3/uL — ABNORMAL HIGH (ref 4.0–10.5)

## 2014-09-30 LAB — URINE MICROSCOPIC-ADD ON

## 2014-09-30 MED ORDER — ONDANSETRON HCL 4 MG/2ML IJ SOLN
4.0000 mg | Freq: Once | INTRAMUSCULAR | Status: AC
  Administered 2014-09-30: 4 mg via INTRAVENOUS
  Filled 2014-09-30: qty 2

## 2014-09-30 NOTE — ED Notes (Signed)
Pt c/o unable to fully urinate starting at 0100 today. Pt c/o N/V and abdominal pain. Pt has not taken any of his medications today.

## 2014-09-30 NOTE — ED Notes (Signed)
MD at bedside. 

## 2014-09-30 NOTE — ED Notes (Signed)
Patient transported to MRI 

## 2014-09-30 NOTE — ED Provider Notes (Signed)
CSN: [id number]     Arrival date & time 09/30/14  0840 History   First MD Initiated Contact with Patient 09/30/14 [phone]     Chief Complaint  Patient presents with  . Abdominal Pain  . Urinary Retention  . Emesis     (Consider location/radiation/quality/duration/timing/severity/associated sxs/prior Treatment) Patient is a 80 y.o. male presenting with abdominal pain and vomiting. The history is provided by the patient and the spouse.  Abdominal Pain Pain location:  Suprapubic Pain quality: aching, cramping, sharp and shooting   Pain radiates to:  Does not radiate Pain severity:  Severe Onset quality:  Gradual Duration:  8 hours Timing:  Constant Progression:  Worsening Chronicity:  New Context: awakening from sleep   Relieved by:  Nothing Worsened by:  Nothing tried Ineffective treatments:  None tried Associated symptoms: dysuria, nausea and vomiting   Associated symptoms: no chest pain, no cough, no diarrhea and no fever   Associated symptoms comment:  Unable to urinate.  Urinary frequency and dysuria, but only able to get a few drops of urine out Risk factors: aspirin and being elderly   Risk factors: no recent hospitalization   Risk factors comment:  Had steroid injection in his back on wednesday (5days PTA) Emesis Associated symptoms: abdominal pain   Associated symptoms: no diarrhea     Past Medical History  Diagnosis Date  . LBBB (left bundle branch block)   . Sleep disturbance   . Insomnia   . Tremor   . HTN (hypertension)     orthostatic  . Dyslipidemia   . Thrombocytopenia   . Colon polyp   . Diverticulosis   . Renal insufficiency     renal duplex scan in 2012 showed no renal artery stenosis  . Coronary atherosclerosis of native coronary artery     a. s/p CABG 11/2003. b. s/p DES to radial-OM and SVG-RCA 11/05. c. s/p BMS to LCx 11/2008.  . Lumbar spondylolysis     multilevel DJD with mild spinal stenosis L3-L5  . Headache(784.0)   . Arthritis   .  Thrombocytopenia   . Elevated fasting glucose   . Gastritis   . Hiatal hernia   . Migraines   . H/O urinary retention     post surgical procedures  . Parkinson's disease   . Stroke     a. remote hx of stroke reportedly r/t HTN.  . Intracranial hemorrhage     a. small pontine [place] 2003.  [name] Peptic ulcer disease     a. prior hx of bleeding ulcers, including 12/2003 in setting of aspirin use.  . Gastritis     a. by EGD 04/2004.  [name] Hypertensive heart disease   . Dilated aortic root 05/29/2014   Past Surgical History  Procedure Laterality Date  . Wisdom tooth extraction    . Esophagogastroduodenoscopy      10/2001, 12/2003, 04/2004  . Coronary artery bypass graft  2005  . Shoulder arthroscopy Left 9/10  . Appendectomy  62    1963  . Stones      hx  . Coronary angioplasty  6/10,10/05  . Cholecystectomy  1998  . Lumbar laminectomy/decompression microdiscectomy N/A 05/03/2013    Procedure: LUMBAR LAMINECTOMY/DCOMPRESSION MICRODISCECTOMY RIGHT LUMBAR FOUR FIVE;  Surgeon: [name], MD;  Location: [facility];  Service: Neurosurgery;  Laterality: N/A;  . Colonoscopy  10/2001  . Colon surgery  03/14/2008   Family History  Problem Relation Age of Onset  . CAD Father 37  ASCAD  . CAD Sister    History  Substance Use Topics  . Smoking status: Former Smoker -- 2.00 packs/day for 5 years    Types: Cigarettes    Quit date: 04/28/1959  . Smokeless tobacco: Never Used     Comment: 55+ yrs ago  . Alcohol Use: No    Review of Systems  Constitutional: Negative for fever.  Respiratory: Negative for cough.   Cardiovascular: Negative for chest pain.  Gastrointestinal: Positive for nausea, vomiting and abdominal pain. Negative for diarrhea.  Genitourinary: Positive for dysuria.  All other systems reviewed and are negative.     Allergies  Aspirin; Celebrex; Nsaids; Toprol xl; Altace; Levothyroxine; Biaxin; and Tetracyclines & related  Home Medications   Prior to Admission  medications   Medication Sig Start Date End Date Taking? Authorizing Provider  aspirin 81 MG tablet Take one tablet (81 mg) by mouth daily on Monday, Wednesday, & fridays    Historical Provider, MD  citalopram (CELEXA) 20 MG tablet Take 20 mg by mouth daily.    Historical Provider, MD  cloNIDine (CATAPRES) 0.2 MG tablet Take 0.2 mg by mouth as directed. Takes 1 tablet in AM and 1.5 tablets in PM daily    Historical Provider, MD  irbesartan (AVAPRO) 300 MG tablet Take 300 mg by mouth every morning.    Historical Provider, MD  metoprolol (LOPRESSOR) 50 MG tablet Take 50 mg by mouth 2 (two) times daily.    Historical Provider, MD  Omega-3 Fatty Acids (FISH OIL PO) Take 800 mg by mouth daily. Carlson's Liquid    Historical Provider, MD  pantoprazole (PROTONIX) 40 MG tablet Take 40 mg by mouth every morning.    Historical Provider, MD  tamsulosin (FLOMAX) 0.4 MG CAPS capsule Take 1 capsule (0.4 mg total) by mouth daily. 05/04/13   Kary Kos, MD  terazosin (HYTRIN) 2 MG capsule Take 1 capsule (2 mg total) by mouth every evening. 05/02/14   Sueanne Margarita, MD  traMADol (ULTRAM) 50 MG tablet Take 50 mg by mouth daily as needed.     Historical Provider, MD   BP 212/110 mmHg  Pulse 73  Temp(Src) 97.6 F (36.4 C) (Oral)  Resp 20  Ht 5' 8.5" (1.74 m)  Wt 200 lb (90.719 kg)  BMI 29.96 kg/m2  SpO2 97% Physical Exam  Constitutional: He is oriented to person, place, and time. He appears well-developed and well-nourished. He appears distressed.  Appears uncomfortable  HENT:  Head: Normocephalic and atraumatic.  Mouth/Throat: Oropharynx is clear and moist.  Eyes: Conjunctivae and EOM are normal. Pupils are equal, round, and reactive to light.  Neck: Normal range of motion. Neck supple.  Cardiovascular: Normal rate, regular rhythm and intact distal pulses.   No murmur heard. Pulmonary/Chest: Effort normal and breath sounds normal. No respiratory distress. He has no wheezes. He has no rales.  Abdominal:  Soft. He exhibits no distension. There is tenderness in the suprapubic area. There is no rebound and no guarding.  Musculoskeletal: Normal range of motion. He exhibits no edema or tenderness.  Neurological: He is alert and oriented to person, place, and time.  Skin: Skin is warm and dry. No rash noted. No erythema.  Psychiatric: He has a normal mood and affect. His behavior is normal.  Nursing note and vitals reviewed.   ED Course  Procedures (including critical care time) Labs Review Labs Reviewed  CBC WITH DIFFERENTIAL/PLATELET - Abnormal; Notable for the following:    WBC 14.4 (*)  MCHC 36.8 (*)    Neutrophils Relative % 83 (*)    Lymphocytes Relative 11 (*)    Neutro Abs 12.0 (*)    All other components within normal limits  URINALYSIS, ROUTINE W REFLEX MICROSCOPIC - Abnormal; Notable for the following:    Hgb urine dipstick MODERATE (*)    All other components within normal limits  I-STAT CHEM 8, ED - Abnormal; Notable for the following:    Glucose, Bld 159 (*)    All other components within normal limits  URINE CULTURE  URINE MICROSCOPIC-ADD ON    Imaging Review [name]  09/30/2014   CLINICAL DATA:  Urinary retention with nausea, vomiting and abdominal pain today. History of back surgery. Initial encounter.  EXAM: MRI LUMBAR SPINE WITHOUT CONTRAST  TECHNIQUE: Multiplanar, multisequence MR imaging of the lumbar spine was performed. No intravenous contrast was administered.  COMPARISON:  Lumbar spine MRI 06/21/2013. Abdominal pelvic CT 09/08/2010.  FINDINGS: Five lumbar type vertebral bodies are assumed. The alignment is stable. There are resolving postsurgical changes within the right posterior paraspinal soft tissues at L4-5. Chronic L5 pars defects noted.  The conus medullaris extends to the L1-2 level and appears normal. No acute paraspinal abnormalities identified. Duodenal diverticulum, mild renal cortical thinning and mild aortic ectasia noted.  L1-2:  Stable mild disc bulging, anterior osteophytes and facet/ ligamentous hypertrophy. No significant spinal stenosis or nerve root encroachment.  L2-3: Disc height is relatively maintained. There is stable mild disc bulging eccentric to the left and mild facet and ligamentous hypertrophy. No significant spinal stenosis or nerve root encroachment.  L3-4: Disc height is relatively maintained. There is stable mild disc bulging eccentric to the left and mild facet and ligamentous hypertrophy. Stable mild foraminal narrowing bilaterally.  L4-5: Resolving postsurgical changes on the right status post apparent partial facetectomy and lateral discectomy. Right sided foraminal narrowing appears mildly improved. The left foramen is patent. The spinal canal and lateral recesses are widely patent.  L5-S1: Chronic bilateral pars defects. Minimal disc bulging with relatively maintained height and no significant foraminal compromise or nerve root encroachment.  IMPRESSION: 1. No acute findings or explanation for the patient's symptoms. The distal thoracic cord and conus medullaris appear normal. 2. Resolving postsurgical changes on the right at L4-5. There is some residual right-sided foraminal narrowing which appears improved from the most recent MRI.   Electronically Signed   By: Richardean Sale M.D.   On: 09/30/2014 13:26     EKG Interpretation None      MDM   Final diagnoses:  Urinary retention    Patient presents with abdominal pain and urinary retention that started about 1 AM. Patient has started to have dry heaving and significant suprapubic pain. He is only able to get a few drops of urine out. Bedside ultrasound shows a distended bladder with about 1 L of urine. Patient was feeling his normal self until awaken this morning with symptoms. He denies any change in his medications that states he has had this issue before after surgery.  Patient 5 days ago had a steroid injection in his back but had been doing  well and denies any seizures or bowel problems.  CBC, Chem-8, urinalysis pending. Foley catheter will be placed. Patient is hypertensive here but has not taken any of his home medications  10:49 AM Labs without acute findings. Speaking in depth with the patient and his wife he had a steroid injection done 4-5 days ago. Now he has  urinary retention with an output of 1100. Concerned that this could be cord compression from recent procedure versus hematoma. We will do an MRI of the lumbar spine to further evaluate. Patient is much more comfortable after Foley catheter placed. He now has no abdominal pain  2:31 PM Imaging without acute findings. Will be changed over to a leg bag and patient will be discharged home to follow-up with urology.  Blanchie Dessert, MD 09/30/14 1431

## 2014-09-30 NOTE — Discharge Instructions (Signed)
Acute Urinary Retention °Acute urinary retention is the temporary inability to urinate. °This is a common problem in older men. As men age their prostates become larger and block the flow of urine from the bladder. This is usually a problem that has come on gradually.  °HOME CARE INSTRUCTIONS °If you are sent home with a Foley catheter and a drainage system, you will need to discuss the best course of action with your health care provider. While the catheter is in, maintain a good intake of fluids. Keep the drainage bag emptied and lower than your catheter. This is so that contaminated urine will not flow back into your bladder, which could lead to a urinary tract infection. °There are two main types of drainage bags. One is a large bag that usually is used at night. It has a good capacity that will allow you to sleep through the night without having to empty it. The second type is called a leg bag. It has a smaller capacity, so it needs to be emptied more frequently. However, the main advantage is that it can be attached by a leg strap and can go underneath your clothing, allowing you the freedom to move about or leave your home. °Only take over-the-counter or prescription medicines for pain, discomfort, or fever as directed by your health care provider.  °SEEK MEDICAL CARE IF: °· You develop a low-grade fever. °· You experience spasms or leakage of urine with the spasms. °SEEK IMMEDIATE MEDICAL CARE IF:  °· You develop chills or fever. °· Your catheter stops draining urine. °· Your catheter falls out. °· You start to develop increased bleeding that does not respond to rest and increased fluid intake. °MAKE SURE YOU: °· Understand these instructions. °· Will watch your condition. °· Will get help right away if you are not doing well or get worse. °Document Released: 09/21/2000 Document Revised: 06/20/2013 Document Reviewed: 11/24/2012 °ExitCare® Patient Information ©2015 [employer]. This information is not  intended to replace advice given to you by your health care provider. Make sure you discuss any questions you have with your health care provider. ° °

## 2014-10-01 LAB — URINE CULTURE
Colony Count: NO GROWTH
Culture: NO GROWTH

## 2014-10-02 DIAGNOSIS — R339 Retention of urine, unspecified: Secondary | ICD-10-CM | POA: Diagnosis not present

## 2014-10-02 DIAGNOSIS — N138 Other obstructive and reflux uropathy: Secondary | ICD-10-CM | POA: Diagnosis not present

## 2014-10-02 DIAGNOSIS — N401 Enlarged prostate with lower urinary tract symptoms: Secondary | ICD-10-CM | POA: Diagnosis not present

## 2014-10-03 ENCOUNTER — Other Ambulatory Visit (HOSPITAL_COMMUNITY): Payer: Self-pay

## 2014-10-03 ENCOUNTER — Encounter (HOSPITAL_COMMUNITY): Payer: Self-pay | Admitting: *Deleted

## 2014-10-03 ENCOUNTER — Emergency Department (HOSPITAL_COMMUNITY): Payer: Medicare Other

## 2014-10-03 ENCOUNTER — Inpatient Hospital Stay (HOSPITAL_COMMUNITY)
Admission: EM | Admit: 2014-10-03 | Discharge: 2014-10-05 | DRG: 872 | Disposition: A | Discharge status: Home / Self Care | Payer: Medicare Other | Attending: Internal Medicine | Admitting: Internal Medicine

## 2014-10-03 DIAGNOSIS — I129 Hypertensive chronic kidney disease with stage 1 through stage 4 chronic kidney disease, or unspecified chronic kidney disease: Secondary | ICD-10-CM | POA: Diagnosis present

## 2014-10-03 DIAGNOSIS — D72829 Elevated white blood cell count, unspecified: Secondary | ICD-10-CM | POA: Diagnosis not present

## 2014-10-03 DIAGNOSIS — Z951 Presence of aortocoronary bypass graft: Secondary | ICD-10-CM | POA: Diagnosis present

## 2014-10-03 DIAGNOSIS — N183 Chronic kidney disease, stage 3 unspecified: Secondary | ICD-10-CM | POA: Diagnosis present

## 2014-10-03 DIAGNOSIS — K5710 Diverticulosis of small intestine without perforation or abscess without bleeding: Secondary | ICD-10-CM | POA: Diagnosis present

## 2014-10-03 DIAGNOSIS — G43909 Migraine, unspecified, not intractable, without status migrainosus: Secondary | ICD-10-CM | POA: Diagnosis present

## 2014-10-03 DIAGNOSIS — I10 Essential (primary) hypertension: Secondary | ICD-10-CM | POA: Diagnosis present

## 2014-10-03 DIAGNOSIS — Z8673 Personal history of transient ischemic attack (TIA), and cerebral infarction without residual deficits: Secondary | ICD-10-CM

## 2014-10-03 DIAGNOSIS — I447 Left bundle-branch block, unspecified: Secondary | ICD-10-CM | POA: Diagnosis present

## 2014-10-03 DIAGNOSIS — D696 Thrombocytopenia, unspecified: Secondary | ICD-10-CM | POA: Diagnosis present

## 2014-10-03 DIAGNOSIS — A419 Sepsis, unspecified organism: Principal | ICD-10-CM | POA: Diagnosis present

## 2014-10-03 DIAGNOSIS — Z87891 Personal history of nicotine dependence: Secondary | ICD-10-CM

## 2014-10-03 DIAGNOSIS — R9431 Abnormal electrocardiogram [ECG] [EKG]: Secondary | ICD-10-CM | POA: Diagnosis present

## 2014-10-03 DIAGNOSIS — R339 Retention of urine, unspecified: Secondary | ICD-10-CM | POA: Diagnosis present

## 2014-10-03 DIAGNOSIS — Z7982 Long term (current) use of aspirin: Secondary | ICD-10-CM

## 2014-10-03 DIAGNOSIS — G20 Parkinson's disease: Secondary | ICD-10-CM | POA: Diagnosis present

## 2014-10-03 DIAGNOSIS — Z86010 Personal history of colonic polyps: Secondary | ICD-10-CM | POA: Diagnosis not present

## 2014-10-03 DIAGNOSIS — N400 Enlarged prostate without lower urinary tract symptoms: Secondary | ICD-10-CM | POA: Diagnosis not present

## 2014-10-03 DIAGNOSIS — Z8711 Personal history of peptic ulcer disease: Secondary | ICD-10-CM

## 2014-10-03 DIAGNOSIS — G20A1 Parkinson's disease without dyskinesia, without mention of fluctuations: Secondary | ICD-10-CM | POA: Diagnosis present

## 2014-10-03 DIAGNOSIS — I2510 Atherosclerotic heart disease of native coronary artery without angina pectoris: Secondary | ICD-10-CM | POA: Diagnosis not present

## 2014-10-03 DIAGNOSIS — Z886 Allergy status to analgesic agent status: Secondary | ICD-10-CM | POA: Diagnosis not present

## 2014-10-03 DIAGNOSIS — R112 Nausea with vomiting, unspecified: Secondary | ICD-10-CM | POA: Diagnosis not present

## 2014-10-03 DIAGNOSIS — M1990 Unspecified osteoarthritis, unspecified site: Secondary | ICD-10-CM | POA: Diagnosis present

## 2014-10-03 DIAGNOSIS — E785 Hyperlipidemia, unspecified: Secondary | ICD-10-CM | POA: Diagnosis present

## 2014-10-03 DIAGNOSIS — N401 Enlarged prostate with lower urinary tract symptoms: Secondary | ICD-10-CM | POA: Diagnosis present

## 2014-10-03 DIAGNOSIS — N390 Urinary tract infection, site not specified: Secondary | ICD-10-CM | POA: Diagnosis not present

## 2014-10-03 DIAGNOSIS — I4891 Unspecified atrial fibrillation: Secondary | ICD-10-CM | POA: Diagnosis present

## 2014-10-03 DIAGNOSIS — R0602 Shortness of breath: Secondary | ICD-10-CM | POA: Diagnosis not present

## 2014-10-03 LAB — CBC WITH DIFFERENTIAL/PLATELET
BASOS ABS: 0 10*3/uL (ref 0.0–0.1)
BASOS PCT: 0 % (ref 0–1)
Eosinophils Absolute: 0 10*3/uL (ref 0.0–0.7)
Eosinophils Relative: 0 % (ref 0–5)
HCT: 39.8 % (ref 39.0–52.0)
Hemoglobin: 14.7 g/dL (ref 13.0–17.0)
LYMPHS ABS: 0.6 10*3/uL — AB (ref 0.7–4.0)
LYMPHS PCT: 3 % — AB (ref 12–46)
MCH: 32.7 pg (ref 26.0–34.0)
MCHC: 36.9 g/dL — ABNORMAL HIGH (ref 30.0–36.0)
MCV: 88.4 fL (ref 78.0–100.0)
Monocytes Absolute: 1.5 10*3/uL — ABNORMAL HIGH (ref 0.1–1.0)
Monocytes Relative: 7 % (ref 3–12)
NEUTROS ABS: 19 10*3/uL — AB (ref 1.7–7.7)
NEUTROS PCT: 90 % — AB (ref 43–77)
PLATELETS: 121 10*3/uL — AB (ref 150–400)
RBC: 4.5 MIL/uL (ref 4.22–5.81)
RDW: 13.8 % (ref 11.5–15.5)
WBC: 21.2 10*3/uL — AB (ref 4.0–10.5)

## 2014-10-03 LAB — URINALYSIS, ROUTINE W REFLEX MICROSCOPIC
Bilirubin Urine: NEGATIVE
Glucose, UA: NEGATIVE mg/dL
Ketones, ur: NEGATIVE mg/dL
NITRITE: POSITIVE — AB
PROTEIN: 30 mg/dL — AB
Specific Gravity, Urine: 1.017 (ref 1.005–1.030)
UROBILINOGEN UA: 0.2 mg/dL (ref 0.0–1.0)
pH: 7 (ref 5.0–8.0)

## 2014-10-03 LAB — URINE MICROSCOPIC-ADD ON

## 2014-10-03 LAB — COMPREHENSIVE METABOLIC PANEL
ALT: 26 U/L (ref 0–53)
ANION GAP: 6 (ref 5–15)
AST: 27 U/L (ref 0–37)
Albumin: 3.6 g/dL (ref 3.5–5.2)
Alkaline Phosphatase: 91 U/L (ref 39–117)
BUN: 13 mg/dL (ref 6–23)
CALCIUM: 9.1 mg/dL (ref 8.4–10.5)
CHLORIDE: 103 mmol/L (ref 96–112)
CO2: 27 mmol/L (ref 19–32)
CREATININE: 1.34 mg/dL (ref 0.50–1.35)
GFR, EST AFRICAN AMERICAN: 57 mL/min — AB (ref >90)
GFR, EST NON AFRICAN AMERICAN: 49 mL/min — AB (ref >90)
GLUCOSE: 145 mg/dL — AB (ref 70–99)
Potassium: 3.9 mmol/L (ref 3.5–5.1)
Sodium: 136 mmol/L (ref 135–145)
Total Bilirubin: 1.2 mg/dL (ref 0.3–1.2)
Total Protein: 6.8 g/dL (ref 6.0–8.3)

## 2014-10-03 LAB — I-STAT TROPONIN, ED: Troponin i, poc: 0 ng/mL (ref 0.00–0.08)

## 2014-10-03 LAB — I-STAT CG4 LACTIC ACID, ED: Lactic Acid, Venous: 1.22 mmol/L (ref 0.5–2.0)

## 2014-10-03 LAB — LIPASE, BLOOD: Lipase: 33 U/L (ref 11–59)

## 2014-10-03 MED ORDER — ONDANSETRON HCL 4 MG/2ML IJ SOLN: 4.0000 mg | Freq: Four times a day (QID) | INTRAMUSCULAR | Status: DC | PRN

## 2014-10-03 MED ORDER — OXYCODONE HCL 5 MG PO TABS
5.0000 mg | ORAL_TABLET | ORAL | Status: DC | PRN
  Administered 2014-10-03 – 2014-10-05 (×2): 5 mg via ORAL
  Filled 2014-10-03 (×2): qty 1

## 2014-10-03 MED ORDER — SODIUM CHLORIDE 0.9 % IV SOLN
INTRAVENOUS | Status: DC
  Administered 2014-10-03 – 2014-10-05 (×3): via INTRAVENOUS

## 2014-10-03 MED ORDER — TAMSULOSIN HCL 0.4 MG PO CAPS
0.4000 mg | ORAL_CAPSULE | Freq: Every day | ORAL | Status: DC
  Administered 2014-10-04 – 2014-10-05 (×2): 0.4 mg via ORAL
  Filled 2014-10-03 (×2): qty 1

## 2014-10-03 MED ORDER — ACETAMINOPHEN 650 MG RE SUPP: 650.0000 mg | Freq: Four times a day (QID) | RECTAL | Status: DC | PRN

## 2014-10-03 MED ORDER — OMEGA-3-ACID ETHYL ESTERS 1 G PO CAPS
1.0000 g | ORAL_CAPSULE | Freq: Every day | ORAL | Status: DC
  Administered 2014-10-03 – 2014-10-05 (×3): 1 g via ORAL
  Filled 2014-10-03 (×3): qty 1

## 2014-10-03 MED ORDER — IRBESARTAN 300 MG PO TABS
300.0000 mg | ORAL_TABLET | Freq: Every morning | ORAL | Status: DC
  Administered 2014-10-04 – 2014-10-05 (×2): 300 mg via ORAL
  Filled 2014-10-03 (×2): qty 1

## 2014-10-03 MED ORDER — ACETAMINOPHEN 325 MG PO TABS
650.0000 mg | ORAL_TABLET | Freq: Four times a day (QID) | ORAL | Status: DC | PRN
  Administered 2014-10-04 – 2014-10-05 (×3): 650 mg via ORAL
  Filled 2014-10-03 (×4): qty 2

## 2014-10-03 MED ORDER — CITALOPRAM HYDROBROMIDE 20 MG PO TABS
20.0000 mg | ORAL_TABLET | Freq: Every day | ORAL | Status: DC
  Administered 2014-10-03 – 2014-10-05 (×3): 20 mg via ORAL
  Filled 2014-10-03 (×3): qty 1

## 2014-10-03 MED ORDER — CLONIDINE HCL 0.2 MG PO TABS
0.2000 mg | ORAL_TABLET | Freq: Every day | ORAL | Status: DC
  Administered 2014-10-04 – 2014-10-05 (×2): 0.2 mg via ORAL
  Filled 2014-10-03: qty 1
  Filled 2014-10-03: qty 2
  Filled 2014-10-03: qty 1

## 2014-10-03 MED ORDER — METOPROLOL TARTRATE 50 MG PO TABS
50.0000 mg | ORAL_TABLET | Freq: Two times a day (BID) | ORAL | Status: DC
  Administered 2014-10-03 – 2014-10-05 (×4): 50 mg via ORAL
  Filled 2014-10-03 (×5): qty 1

## 2014-10-03 MED ORDER — CLONIDINE HCL 0.3 MG PO TABS
0.3000 mg | ORAL_TABLET | Freq: Every day | ORAL | Status: DC
  Administered 2014-10-03 – 2014-10-04 (×2): 0.3 mg via ORAL
  Filled 2014-10-03 (×3): qty 1

## 2014-10-03 MED ORDER — ACETAMINOPHEN 325 MG PO TABS
650.0000 mg | ORAL_TABLET | Freq: Once | ORAL | Status: AC
  Administered 2014-10-03: 650 mg via ORAL
  Filled 2014-10-03: qty 2

## 2014-10-03 MED ORDER — SODIUM CHLORIDE 0.9 % IV BOLUS (SEPSIS)
1000.0000 mL | Freq: Once | INTRAVENOUS | Status: AC
  Administered 2014-10-03: 1000 mL via INTRAVENOUS

## 2014-10-03 MED ORDER — HYDROMORPHONE HCL 1 MG/ML IJ SOLN: 0.5000 mg | INTRAMUSCULAR | Status: DC | PRN

## 2014-10-03 MED ORDER — ONDANSETRON HCL 4 MG/2ML IJ SOLN
4.0000 mg | Freq: Once | INTRAMUSCULAR | Status: AC
  Administered 2014-10-03: 4 mg via INTRAVENOUS
  Filled 2014-10-03: qty 2

## 2014-10-03 MED ORDER — DEXTROSE 5 % IV SOLN
1.0000 g | Freq: Once | INTRAVENOUS | Status: AC
  Administered 2014-10-03: 1 g via INTRAVENOUS
  Filled 2014-10-03: qty 10

## 2014-10-03 MED ORDER — ONDANSETRON HCL 4 MG PO TABS: 4.0000 mg | ORAL_TABLET | Freq: Four times a day (QID) | ORAL | Status: DC | PRN

## 2014-10-03 MED ORDER — FINASTERIDE 5 MG PO TABS
5.0000 mg | ORAL_TABLET | Freq: Every day | ORAL | Status: DC
  Administered 2014-10-04 – 2014-10-05 (×2): 5 mg via ORAL
  Filled 2014-10-03 (×2): qty 1

## 2014-10-03 MED ORDER — PANTOPRAZOLE SODIUM 40 MG PO TBEC
40.0000 mg | DELAYED_RELEASE_TABLET | Freq: Every morning | ORAL | Status: DC
  Administered 2014-10-04 – 2014-10-05 (×2): 40 mg via ORAL
  Filled 2014-10-03 (×2): qty 1

## 2014-10-03 MED ORDER — ALUM & MAG HYDROXIDE-SIMETH 200-200-20 MG/5ML PO SUSP: 30.0000 mL | Freq: Four times a day (QID) | ORAL | Status: DC | PRN

## 2014-10-03 NOTE — ED Notes (Signed)
Pt arrives via EMS from home. Was here Sunday with  Urinary retention. ED placed a foley which has been removed by the urologist. Pt c/o nausea and dry heaves today.c/o abd pain this morning which has subsided. Denies CP - EKG with EMS showed LBBB with T wave inversion and ST depression. t has hx of parkinsons. Pt was incontinent during EMS transport.

## 2014-10-03 NOTE — H&P (Signed)
[facility] Admission History and Physical       Darryl [name] BPZ:[id number] DOB: 1935/03/25 DOA: 10/03/2014  Referring physician: EDP PCP: Tawanna Solo, MD  Specialists:   Chief Complaint: Nausea and Vomiting and Lower ABD Pain     HPI: Darryl [name] is a 80 y.o. male with a history of Parkinson's , CAD, HTN, and Stage III CKD who presents to the ED with complaints of nausea and vomiting and Lower ABD Pain x 1 day.    His vomiting was dry heaves.   He also had weakness and fatigue, and near syncope.   He was found to have fever in the ED with a temperature of 102.5, and a Leukocytosis of 21.2 K.   A UA was also positive.  He had been having urinary retention problems and was seen in the ED on 04/03 and a foley catheter was placed and he was seen yesterday by urology and the foley catheter was removed.      Review of Systems:  Constitutional: No Weight Loss, No Weight Gain, Night Sweats, +Fevers, +Chills, Dizziness, Light Headedness, Fatigue, +Generalized Weakness HEENT: No Headaches, Difficulty Swallowing,Tooth/Dental Problems,Sore Throat,  No Sneezing, Rhinitis, Ear Ache, Nasal Congestion, or Post Nasal Drip,  Cardio-vascular:  No Chest pain, Orthopnea, PND, Edema in Lower Extremities, Anasarca, Dizziness, Palpitations  Resp: No Dyspnea, No DOE, No Productive Cough, No Non-Productive Cough, No Hemoptysis, No Wheezing.    GI: No Heartburn, Indigestion, +Abdominal Pain,+ Nausea, +Vomiting, Diarrhea, Constipation, Hematemesis, Hematochezia, Melena, Change in Bowel Habits,  Loss of Appetite  GU: No Dysuria, +Urinary Retention, No Change in Color of Urine, No Urgency or Urinary Frequency, No Flank pain.  Musculoskeletal: No Joint Pain or Swelling, No Decreased Range of Motion, No Back Pain.  Neurologic: No Syncope, No Seizures, Muscle Weakness, Paresthesia, Vision Disturbance or Loss, No Diplopia, No Vertigo, No Difficulty Walking,  Skin: No Rash or Lesions. Psych: No  Change in Mood or Affect, No Depression or Anxiety, No Memory loss, No Confusion, or Hallucinations   Past Medical History  Diagnosis Date  . LBBB (left bundle branch block)   . Sleep disturbance   . Insomnia   . Tremor   . HTN (hypertension)     orthostatic  . Dyslipidemia   . Thrombocytopenia   . Colon polyp   . Diverticulosis   . Renal insufficiency     renal duplex scan in 2012 showed no renal artery stenosis  . Coronary atherosclerosis of native coronary artery     a. s/p CABG 11/2003. b. s/p DES to radial-OM and SVG-RCA 11/05. c. s/p BMS to LCx 11/2008.  . Lumbar spondylolysis     multilevel DJD with mild spinal stenosis L3-L5  . Headache(784.0)   . Arthritis   . Thrombocytopenia   . Elevated fasting glucose   . Gastritis   . Hiatal hernia   . Migraines   . H/O urinary retention     post surgical procedures  . Parkinson's disease   . Stroke     a. remote hx of stroke reportedly r/t HTN.  . Intracranial hemorrhage     a. small pontine [place] 2003.  [name] Peptic ulcer disease     a. prior hx of bleeding ulcers, including 12/2003 in setting of aspirin use.  . Gastritis     a. by EGD 04/2004.  [name] Hypertensive heart disease   . Dilated aortic root 05/29/2014     Past Surgical History  Procedure Laterality Date  . Wisdom  tooth extraction    . Esophagogastroduodenoscopy      10/2001, 12/2003, 04/2004  . Coronary artery bypass graft  2005  . Shoulder arthroscopy Left 9/10  . Appendectomy  62    1963  . Stones      hx  . Coronary angioplasty  6/10,10/05  . Cholecystectomy  1998  . Lumbar laminectomy/decompression microdiscectomy N/A 05/03/2013    Procedure: LUMBAR LAMINECTOMY/DCOMPRESSION MICRODISCECTOMY RIGHT LUMBAR FOUR FIVE;  Surgeon: [name], MD;  Location: [place];  Service: Neurosurgery;  Laterality: N/A;  . Colonoscopy  10/2001  . Colon surgery  03/14/2008      Prior to Admission medications   Medication Sig Start Date End Date Taking? Authorizing  Provider  acetaminophen (TYLENOL) 500 MG tablet Take 1,000 mg by mouth every 8 (eight) hours as needed for mild pain or moderate pain.   Yes Historical Provider, MD  aspirin 81 MG tablet Take one tablet (81 mg) by mouth daily on Monday, Wednesday, & fridays   Yes Historical Provider, MD  citalopram (CELEXA) 20 MG tablet Take 20 mg by mouth daily.   Yes Historical Provider, MD  cloNIDine (CATAPRES) 0.2 MG tablet Take 0.2 mg by mouth as directed. Takes 1 tablet in AM and 1.5 tablets in PM daily   Yes Historical Provider, MD  finasteride (PROSCAR) 5 MG tablet Take 5 mg by mouth daily.   Yes Historical Provider, MD  irbesartan (AVAPRO) 300 MG tablet Take 300 mg by mouth every morning.   Yes Historical Provider, MD  metoprolol (LOPRESSOR) 50 MG tablet Take 50 mg by mouth 2 (two) times daily.   Yes Historical Provider, MD  Omega-3 Fatty Acids (FISH OIL PO) Take 800 mg by mouth daily. Carlson's Liquid   Yes Historical Provider, MD  pantoprazole (PROTONIX) 40 MG tablet Take 40 mg by mouth every morning.   Yes Historical Provider, MD  tamsulosin (FLOMAX) 0.4 MG CAPS capsule Take 1 capsule (0.4 mg total) by mouth daily. 05/04/13  Yes Kary Kos, MD  terazosin (HYTRIN) 2 MG capsule Take 1 capsule (2 mg total) by mouth every evening. Patient not taking: Reported on 09/30/2014 05/02/14   Sueanne Margarita, MD  traMADol (ULTRAM) 50 MG tablet Take 50 mg by mouth daily as needed.     Historical Provider, MD     Allergies  Allergen Reactions  . Aspirin Other (See Comments)    ulcers  . Celebrex [Celecoxib] Other (See Comments)    Due to ulcer.  . Nsaids Other (See Comments)    Due to ulcer.  . Toprol Xl [Metoprolol Tartrate] Swelling  . Altace [Ramipril]   . Levothyroxine Other (See Comments)    "made him feel bad"  . Biaxin [Clarithromycin] Nausea Only  . Tetracyclines & Related Nausea Only    Social History:  reports that he quit smoking about 55 years ago. His smoking use included Cigarettes. He has a 10  pack-year smoking history. He has never used smokeless tobacco. He reports that he does not drink alcohol or use illicit drugs.    Family History  Problem Relation Age of Onset  . CAD Father 59    ASCAD  . CAD Sister        Physical Exam:  GEN:  Pleasant Elderly Obese  80 y.o. Caucasian male examined and in no acute distress; cooperative with exam Filed Vitals:   10/03/14 1815 10/03/14 1830 10/03/14 1915 10/03/14 1930  BP: 187/84 168/76 160/79 160/82  Pulse: 96 93 92 92  Temp:      TempSrc:      Resp: 24 28 27 19   Height:      Weight:      SpO2: 97% 93% 96% 96%   Blood pressure 160/82, pulse 92, temperature 101.8 F (38.8 C), temperature source Oral, resp. rate 19, height 5\' 9"  (1.753 m), weight 90.719 kg (200 lb), SpO2 96 %. PSYCH: He is alert and oriented x4; does not appear anxious does not appear depressed; affect is normal HEENT: Normocephalic and Atraumatic, Mucous membranes pink; PERRLA; EOM intact; Fundi:  Benign;  No scleral icterus, Nares: Patent, Oropharynx: Clear, Fair Dentition,    Neck:  FROM, No Cervical Lymphadenopathy nor Thyromegaly or Carotid Bruit; No JVD; Breasts:: Not examined CHEST WALL: No tenderness CHEST: Normal respiration, clear to auscultation bilaterally HEART: Regular rate and rhythm; no murmurs rubs or gallops BACK: No kyphosis or scoliosis; No CVA tenderness ABDOMEN: Positive Bowel Sounds, Obese, Soft Non-Tender, No Rebound or Guarding; No Masses, No Organomegaly. Rectal Exam: Not done EXTREMITIES: No Cyanosis, Clubbing, or Edema; No Ulcerations. Genitalia: not examined PULSES: 2+ and symmetric SKIN: Normal hydration no rash or ulceration CNS:  Alert and Oriented x 4, No Focal Deficits Vascular: pulses palpable throughout    Labs on Admission:  Basic Metabolic Panel:  Recent Labs Lab 09/30/14 0921 10/03/14 1650  NA 138 136  K 3.6 3.9  CL 101 103  CO2  --  27  GLUCOSE 159* 145*  BUN 21 13  CREATININE 1.20 1.34  CALCIUM  --  9.1    Liver Function Tests:  Recent Labs Lab 10/03/14 1650  AST 27  ALT 26  ALKPHOS 91  BILITOT 1.2  PROT 6.8  ALBUMIN 3.6    Recent Labs Lab 10/03/14 1650  LIPASE 33   No results for input(s): AMMONIA in the last 168 hours. CBC:  Recent Labs Lab 09/30/14 0900 09/30/14 0921 10/03/14 1650  WBC 14.4*  --  21.2*  NEUTROABS 12.0*  --  19.0*  HGB 16.8 16.3 14.7  HCT 45.6 48.0 39.8  MCV 87.7  --  88.4  PLT PLATELET CLUMPS NOTED ON SMEAR, UNABLE TO ESTIMATE  --  121*   Cardiac Enzymes: No results for input(s): CKTOTAL, CKMB, CKMBINDEX, TROPONINI in the last 168 hours.  BNP (last 3 results) No results for input(s): BNP in the last 8760 hours.  ProBNP (last 3 results) No results for input(s): PROBNP in the last 8760 hours.  CBG: No results for input(s): GLUCAP in the last 168 hours.  Radiological Exams on Admission: Dg Chest 2 View  10/03/2014   CLINICAL DATA:  Vomiting, nausea, shortness of breath,  EXAM: CHEST  2 VIEW  COMPARISON:  04/27/2013  FINDINGS: Cardiomediastinal silhouette is stable. Status post CABG again noted. No acute infiltrate or pleural effusion. No pulmonary edema. Bony thorax is stable.  IMPRESSION: No active cardiopulmonary disease.   Electronically Signed   By: Lahoma Crocker M.D.   On: 10/03/2014 18:02     EKG: Independently reviewed.    Assessment/Plan:   80 y.o. male with  Principal Problem:   1.   Sepsis   Blood culture X 2 Sent   Urine Culture Sent   IV Rocephin   Active Problems:   2.   UTI (lower urinary tract infection)   IV Rocephin       3.   HTN (hypertension)   On Metoprolol, Avapro, and Clonidine Rx    hold parameters for SBP < 100  4.   Parkinson's disease- not on Current meds      5.   Coronary atherosclerosis of native coronary artery   On Metoprolol, Avapro, and ASA Rx     6.   Dyslipidemia   On Omega 3 Fatty Acids     7.   CKD (chronic kidney disease), stage III   Monitor BUN/Cr     8.    Leukocytosis- due to #1 and #2   Monitor Trend      9.   DVT Prophylaxis   SCDs          Code Status:     FULL CODE        Family Communication:   Wife at Bedside       Disposition Plan:    Inpatient  Status        Time spent:  [facility] Pager [phone]   If [place] Please Contact the Day Rounding Team MD for [facility]  If 7PM-7AM, Please Contact Night-Floor Coverage  [email or web address] Password [email or web address] 10/03/2014, 7:45 PM     ADDENDUM:   Patient was seen and examined on 10/03/2014

## 2014-10-03 NOTE — ED Notes (Signed)
MD at bedside. 

## 2014-10-03 NOTE — Progress Notes (Signed)
Patient admitted via stretcher from ED. Admitted for UTI/urinary retention. Patient alert and oriented x4. Patient oriented to unit and staff. Patient has generalized weakness. Patient has bruising on arms and back. Patient complains of headache, pain medication given per order. Wife at bedside. Will continue to monitor.

## 2014-10-03 NOTE — ED Notes (Signed)
Patient being transported by [place], [place].

## 2014-10-03 NOTE — ED Notes (Signed)
Phlebotomy and Tech at the bedside.

## 2014-10-03 NOTE — ED Provider Notes (Signed)
CSN: [id number]     Arrival date & time 10/03/14  1635 History   First MD Initiated Contact with Patient 10/03/14 1638     Chief Complaint  Patient presents with  . Nausea     (Consider location/radiation/quality/duration/timing/severity/associated sxs/prior Treatment) HPI  This is a 80 year old male with multiple comorbid conditions documented below, who comes in today with weakness since today associated with nausea and dry heaving. He was recently seen on the third and had urinary retention at that time had Foley catheter placed, he followed-up in urology yesterday foley was removed. Been able to void small amounts of urine since then. He had MRI of his L-spine done at his last ED visit which showed no abnormalities. He denies any cough, shortness of breath, chest pain, abdominal pain, diarrhea.  No known fever at home  Past Medical History  Diagnosis Date  . LBBB (left bundle branch block)   . Sleep disturbance   . Insomnia   . Tremor   . HTN (hypertension)     orthostatic  . Dyslipidemia   . Thrombocytopenia   . Colon polyp   . Diverticulosis   . Renal insufficiency     renal duplex scan in 2012 showed no renal artery stenosis  . Coronary atherosclerosis of native coronary artery     a. s/p CABG 11/2003. b. s/p DES to radial-OM and SVG-RCA 11/05. c. s/p BMS to LCx 11/2008.  . Lumbar spondylolysis     multilevel DJD with mild spinal stenosis L3-L5  . Headache(784.0)   . Arthritis   . Thrombocytopenia   . Elevated fasting glucose   . Gastritis   . Hiatal hernia   . Migraines   . H/O urinary retention     post surgical procedures  . Parkinson's disease   . Stroke     a. remote hx of stroke reportedly r/t HTN.  . Intracranial hemorrhage     a. small pontine [place] 2003.  [name] Peptic ulcer disease     a. prior hx of bleeding ulcers, including 12/2003 in setting of aspirin use.  . Gastritis     a. by EGD 04/2004.  [name] Hypertensive heart disease   . Dilated aortic root 05/29/2014    Past Surgical History  Procedure Laterality Date  . Wisdom tooth extraction    . Esophagogastroduodenoscopy      10/2001, 12/2003, 04/2004  . Coronary artery bypass graft  2005  . Shoulder arthroscopy Left 9/10  . Appendectomy  62    1963  . Stones      hx  . Coronary angioplasty  6/10,10/05  . Cholecystectomy  1998  . Lumbar laminectomy/decompression microdiscectomy N/A 05/03/2013    Procedure: LUMBAR LAMINECTOMY/DCOMPRESSION MICRODISCECTOMY RIGHT LUMBAR FOUR FIVE;  Surgeon: [name], MD;  Location: [facility];  Service: Neurosurgery;  Laterality: N/A;  . Colonoscopy  10/2001  . Colon surgery  03/14/2008   Family History  Problem Relation Age of Onset  . CAD Father 34    ASCAD  . CAD Sister    History  Substance Use Topics  . Smoking status: Former Smoker -- 2.00 packs/day for 5 years    Types: Cigarettes    Quit date: 04/28/1959  . Smokeless tobacco: Never Used     Comment: 55+ yrs ago  . Alcohol Use: No    Review of Systems  Constitutional: Negative for fever and chills.  Eyes: Negative for redness.  Respiratory: Negative for cough and shortness of breath.   Cardiovascular:  Negative for chest pain.  Gastrointestinal: Positive for nausea and vomiting. Negative for abdominal pain and diarrhea.  Genitourinary: Positive for dysuria.  Skin: Negative for rash.  Neurological: Positive for weakness. Negative for headaches.  All other systems reviewed and are negative.     Allergies  Aspirin; Celebrex; Nsaids; Toprol xl; Altace; Levothyroxine; Biaxin; and Tetracyclines & related  Home Medications   Prior to Admission medications   Medication Sig Start Date End Date Taking? Authorizing Provider  acetaminophen (TYLENOL) 500 MG tablet Take 1,000 mg by mouth every 8 (eight) hours as needed for mild pain or moderate pain.   Yes Historical Provider, MD  aspirin 81 MG tablet Take one tablet (81 mg) by mouth daily on Monday, Wednesday, & fridays   Yes Historical  Provider, MD  citalopram (CELEXA) 20 MG tablet Take 20 mg by mouth daily.   Yes Historical Provider, MD  cloNIDine (CATAPRES) 0.2 MG tablet Take 0.2 mg by mouth as directed. Takes 1 tablet in AM and 1.5 tablets in PM daily   Yes Historical Provider, MD  finasteride (PROSCAR) 5 MG tablet Take 5 mg by mouth daily.   Yes Historical Provider, MD  irbesartan (AVAPRO) 300 MG tablet Take 300 mg by mouth every morning.   Yes Historical Provider, MD  metoprolol (LOPRESSOR) 50 MG tablet Take 50 mg by mouth 2 (two) times daily.   Yes Historical Provider, MD  Omega-3 Fatty Acids (FISH OIL PO) Take 800 mg by mouth daily. Carlson's Liquid   Yes Historical Provider, MD  pantoprazole (PROTONIX) 40 MG tablet Take 40 mg by mouth every morning.   Yes Historical Provider, MD  tamsulosin (FLOMAX) 0.4 MG CAPS capsule Take 1 capsule (0.4 mg total) by mouth daily. 05/04/13  Yes Kary Kos, MD  terazosin (HYTRIN) 2 MG capsule Take 1 capsule (2 mg total) by mouth every evening. Patient not taking: Reported on 09/30/2014 05/02/14   Sueanne Margarita, MD  traMADol (ULTRAM) 50 MG tablet Take 50 mg by mouth daily as needed.     Historical Provider, MD   BP 121/73 mmHg  Pulse 81  Temp(Src) 102.8 F (39.3 C) (Rectal)  Resp 23  Ht 5\' 9"  (1.753 m)  Wt 200 lb (90.719 kg)  BMI 29.52 kg/m2  SpO2 96% Physical Exam  Constitutional: He is oriented to person, place, and time. No distress.  HENT:  Head: Normocephalic and atraumatic.  Eyes: EOM are normal. Pupils are equal, round, and reactive to light.  Neck: Normal range of motion. Neck supple.  Cardiovascular: Normal rate.   Pulmonary/Chest: Effort normal. No respiratory distress.  Abdominal: Soft. There is no tenderness.  Genitourinary: Prostate is enlarged. Prostate is not tender.  Musculoskeletal: Normal range of motion.  Neurological: He is alert and oriented to person, place, and time.  Skin: No rash noted. He is not diaphoretic.  Psychiatric: He has a normal mood and  affect.    ED Course  Procedures (including critical care time) Labs Review Labs Reviewed  CBC WITH DIFFERENTIAL/PLATELET  LIPASE, BLOOD  COMPREHENSIVE METABOLIC PANEL  URINALYSIS, ROUTINE W REFLEX MICROSCOPIC  I-STAT TROPOININ, ED    Imaging Review No results found.   EKG Interpretation None      MDM   Final diagnoses:  None    This is a 80 year old male with multiple comorbid conditions documented below, who comes in today with weakness since today associated with nausea and dry heaving  On exam, patient is febrile, tachypnic, normal blood pressure.  Patient without ttp  on abdominal exam.  Prostate enlarged but non-tender.  Patient with difficulty urinating will place foley for urinary retention.  CBC w/ WBC 21.  UA +nitrite, large leuks.  Lactic acid normal.  Have sent urine cx.  Will cover with rocephin.  Will admit for UTI given patient feeling weak and pre-syncopal at home.  Patient stable in the ED      Jarome Matin, MD 10/04/14 2831  Jola Schmidt, MD 10/04/14 [phone]

## 2014-10-04 DIAGNOSIS — R339 Retention of urine, unspecified: Secondary | ICD-10-CM

## 2014-10-04 DIAGNOSIS — R9431 Abnormal electrocardiogram [ECG] [EKG]: Secondary | ICD-10-CM | POA: Diagnosis present

## 2014-10-04 DIAGNOSIS — N400 Enlarged prostate without lower urinary tract symptoms: Secondary | ICD-10-CM | POA: Diagnosis present

## 2014-10-04 LAB — URINE CULTURE: Colony Count: >=100000

## 2014-10-04 LAB — CBC
HCT: 35.8 % — ABNORMAL LOW (ref 39.0–52.0)
HEMOGLOBIN: 12.7 g/dL — AB (ref 13.0–17.0)
MCH: 31.8 pg (ref 26.0–34.0)
MCHC: 35.5 g/dL (ref 30.0–36.0)
MCV: 89.5 fL (ref 78.0–100.0)
Platelets: 117 10*3/uL — ABNORMAL LOW (ref 150–400)
RBC: 4 MIL/uL — AB (ref 4.22–5.81)
RDW: 14.1 % (ref 11.5–15.5)
WBC: 22.5 10*3/uL — ABNORMAL HIGH (ref 4.0–10.5)

## 2014-10-04 LAB — BASIC METABOLIC PANEL
Anion gap: 8 (ref 5–15)
BUN: 16 mg/dL (ref 6–23)
CO2: 19 mmol/L (ref 19–32)
CREATININE: 1.35 mg/dL (ref 0.50–1.35)
Calcium: 8.5 mg/dL (ref 8.4–10.5)
Chloride: 109 mmol/L (ref 96–112)
GFR calc Af Amer: 57 mL/min — ABNORMAL LOW (ref >90)
GFR, EST NON AFRICAN AMERICAN: 49 mL/min — AB (ref >90)
Glucose, Bld: 153 mg/dL — ABNORMAL HIGH (ref 70–99)
Potassium: 3.9 mmol/L (ref 3.5–5.1)
Sodium: 136 mmol/L (ref 135–145)

## 2014-10-04 MED ORDER — ACETAMINOPHEN 325 MG PO TABS
650.0000 mg | ORAL_TABLET | Freq: Once | ORAL | Status: AC
  Administered 2014-10-04: 650 mg via ORAL

## 2014-10-04 MED ORDER — CEFTRIAXONE SODIUM IN DEXTROSE 20 MG/ML IV SOLN
1.0000 g | INTRAVENOUS | Status: DC
  Administered 2014-10-04: 1 g via INTRAVENOUS
  Filled 2014-10-04 (×2): qty 50

## 2014-10-04 NOTE — Progress Notes (Signed)
[facility] PROGRESS NOTE  Darryl [name] KVQ:[id number] DOB: May 10, 1935 DOA: 10/03/2014 PCP: Tawanna Solo, MD  Assessment/Plan:  Principal Problem:   Sepsis: Secondary to UTI/urinary retention: Blood pressure stable. Cultures pending. Continue Rocephin. Continue IV fluids. Active Problems:   UTI (lower urinary tract infection) with BPH and urinary retention: Continue Rocephin. Will need Foley at discharge. Saw Dr. Karsten Ro in the office yesterday. Will notify him of admission. Outpatient follow-up.    Abnormal EKG:  Patient is not on telemetry. EKG computer reading reports atrial fibrillation. Patient has no history of same and on physical exam seems to be in sinus rhythm. I wonder whether this is artifact from his Parkinson's disease/tremor. Will monitor on telemetry and repeat 12-lead EKG.    HTN (hypertension)    Dyslipidemia    Parkinson's disease:  Feels weak. [place] about a month ago. We'll get physical therapy evaluation while here.     Coronary atherosclerosis of native coronary artery:  Followed by Dr. Radford Pax    CKD (chronic kidney disease), stage III  Code Status:  full Family Communication:  wife at bedside  Disposition Plan:    Consultants:    Procedures:     Antibiotics:   ceftriaxone 4/7--  HPI/Subjective:  feels better. Ate a bit. Still weak. No flank pain. No vomiting.   Objective: Filed Vitals:   10/04/14 1000  BP: 134/65  Pulse: 65  Temp: 99.3 F (37.4 C)  Resp: 20    Intake/Output Summary (Last 24 hours) at 10/04/14 1116 Last data filed at 10/04/14 0900  Gross per 24 hour  Intake    477 ml  Output   2250 ml  Net  -1773 ml   Filed Weights   10/03/14 1659 10/03/14 2029  Weight: 90.719 kg (200 lb) 94.2 kg (207 lb 10.8 oz)    Exam:   General:    alert, oriented, mildly tachypneic  Cardiovascular:  Regular rate rhythm without murmurs gallops rubs    Respiratory:  clear to auscultation bilaterally without wheezes  rhonchi or rales  Abdomen: soft nontender nondistended   GU: Foley catheter draining concentrated urine Ext:no clubbing cyanosis or edema  Basic Metabolic Panel:  Recent Labs Lab 09/30/14 0921 10/03/14 1650 10/04/14 0624  NA 138 136 136  K 3.6 3.9 3.9  CL 101 103 109  CO2  --  27 19  GLUCOSE 159* 145* 153*  BUN 21 13 16   CREATININE 1.20 1.34 1.35  CALCIUM  --  9.1 8.5   Liver Function Tests:  Recent Labs Lab 10/03/14 1650  AST 27  ALT 26  ALKPHOS 91  BILITOT 1.2  PROT 6.8  ALBUMIN 3.6    Recent Labs Lab 10/03/14 1650  LIPASE 33   No results for input(s): AMMONIA in the last 168 hours. CBC:  Recent Labs Lab 09/30/14 0900 09/30/14 0921 10/03/14 1650 10/04/14 0624  WBC 14.4*  --  21.2* 22.5*  NEUTROABS 12.0*  --  19.0*  --   HGB 16.8 16.3 14.7 12.7*  HCT 45.6 48.0 39.8 35.8*  MCV 87.7  --  88.4 89.5  PLT PLATELET CLUMPS NOTED ON SMEAR, UNABLE TO ESTIMATE  --  121* PENDING   Cardiac Enzymes: No results for input(s): CKTOTAL, CKMB, CKMBINDEX, TROPONINI in the last 168 hours. BNP (last 3 results) No results for input(s): BNP in the last 8760 hours.  ProBNP (last 3 results) No results for input(s): PROBNP in the last 8760 hours.  CBG: No results for input(s): GLUCAP in the last 168  hours.  Recent Results (from the past 240 hour(s))  Urine culture     Status: None   Collection Time: 09/30/14  9:13 AM  Result Value Ref Range Status   Specimen Description URINE, CATHETERIZED  Final   Special Requests NONE  Final   Colony Count NO GROWTH Performed at [facility]   Final   Culture NO GROWTH Performed at [facility]   Final   Report Status 10/01/2014 FINAL  Final     Studies: Dg Chest 2 View  10/03/2014   CLINICAL DATA:  Vomiting, nausea, shortness of breath,  EXAM: CHEST  2 VIEW  COMPARISON:  04/27/2013  FINDINGS: Cardiomediastinal silhouette is stable. Status post CABG again noted. No acute infiltrate or pleural effusion. No  pulmonary edema. Bony thorax is stable.  IMPRESSION: No active cardiopulmonary disease.   Electronically Signed   By: Lahoma Crocker M.D.   On: 10/03/2014 18:02    Scheduled Meds: . cefTRIAXone (ROCEPHIN)  IV  1 g Intravenous Q24H  . citalopram  20 mg Oral Daily  . cloNIDine  0.2 mg Oral Daily  . cloNIDine  0.3 mg Oral QHS  . finasteride  5 mg Oral Daily  . irbesartan  300 mg Oral q morning - 10a  . metoprolol  50 mg Oral BID  . omega-3 acid ethyl esters  1 g Oral Daily  . pantoprazole  40 mg Oral q morning - 10a  . tamsulosin  0.4 mg Oral Daily   Continuous Infusions: . sodium chloride 75 mL/hr at 10/03/14 2041    Time spent: 35 minutes  [facility] [email or web address], password [facility] 10/04/2014, 11:16 AM  LOS: 1 day

## 2014-10-04 NOTE — Evaluation (Signed)
Physical Therapy Evaluation Patient Details Name: Darryl [name] MRN: [id number] DOB: [birth year] Today's Date: 10/04/2014   History of Present Illness  Patient is a 80 yo male admitted 10/03/14 with N/V, sepsis, UTI, lower abdominal pain.  PMH:  recent fall, Parkinson's disease, CAD, HTN, CKD, spinal stenosis, lumbar laminectomy, stroke/ICH.  Clinical Impression  Patient presents with problems listed below.  Will benefit from acute PT to maximize functional mobility prior to returning home with wife.  Recommend OP PT at discharge for continued therapy for balance and functional mobility.    Follow Up Recommendations Outpatient PT;Supervision/Assistance - 24 hour    Equipment Recommendations   (TBD)    Recommendations for Other Services       Precautions / Restrictions Precautions Precautions: Fall Restrictions Weight Bearing Restrictions: No      Mobility  Bed Mobility Overal bed mobility: Needs Assistance Bed Mobility: Supine to Sit;Sit to Supine     Supine to sit: Min assist Sit to supine: Min guard   General bed mobility comments: Verbal cues for technique.  Assist to raise trunk to sitting position.  Once upright, patient with good sitting balance.  No physical assist required to return to supine.  Transfers Overall transfer level: Needs assistance Equipment used: None Transfers: Sit to/from Stand Sit to Stand: Min guard         General transfer comment: Verbal cues for hand placement.  Good balance in stance.  Ambulation/Gait Ambulation/Gait assistance: Min guard Ambulation Distance (Feet): 24 Feet Assistive device: None Gait Pattern/deviations: Step-through pattern;Decreased stride length;Shuffle Gait velocity: Decreased Gait velocity interpretation: Below normal speed for age/gender General Gait Details: Patient with slow, guarded gait.  Balance fair with gait.  Stairs            Wheelchair Mobility    Modified Rankin (Stroke Patients Only)       Balance Overall balance assessment: Needs assistance;History of Falls Sitting-balance support: No upper extremity supported;Feet supported Sitting balance-Leahy Scale: Good     Standing balance support: No upper extremity supported Standing balance-Leahy Scale: Fair Standing balance comment: Slightly unsteady with dynamic activities                             Pertinent Vitals/Pain Pain Assessment: No/denies pain    Home Living Family/patient expects to be discharged to:: Private residence Living Arrangements: Spouse/significant other Available Help at Discharge: Family;Available 24 hours/day Type of Home: House Home Access: Stairs to enter Entrance Stairs-Rails: None Entrance Stairs-Number of Steps: 2 Home Layout: One level Home Equipment: None      Prior Function Level of Independence: Independent               Hand Dominance   Dominant Hand: Right    Extremity/Trunk Assessment   Upper Extremity Assessment: Overall WFL for tasks assessed;RUE deficits/detail RUE Deficits / Details: Intention tremors noted RUE         Lower Extremity Assessment: Generalized weakness      Cervical / Trunk Assessment: Normal  Communication   Communication: No difficulties  Cognition Arousal/Alertness: Awake/alert Behavior During Therapy: WFL for tasks assessed/performed Overall Cognitive Status: Within Functional Limits for tasks assessed                      General Comments      Exercises        Assessment/Plan    PT Assessment Patient needs continued PT services  PT Diagnosis Difficulty  walking;Abnormality of gait;Generalized weakness   PT Problem List Decreased strength;Decreased activity tolerance;Decreased balance;Decreased mobility;Decreased knowledge of use of DME  PT Treatment Interventions DME instruction;Gait training;Stair training;Functional mobility training;Therapeutic activities;Balance training;Patient/family  education   PT Goals (Current goals can be found in the Care Plan section) Acute Rehab PT Goals Patient Stated Goal: To go  home soon PT Goal Formulation: With patient/family Time For Goal Achievement: 10/11/14 Potential to Achieve Goals: Good    Frequency Min 3X/week   Barriers to discharge        Co-evaluation               End of Session Equipment Utilized During Treatment: Gait belt Activity Tolerance: Patient limited by fatigue Patient left: in bed;with call bell/phone within reach;with family/visitor present           Time: 7680-8811 PT Time Calculation (min) (ACUTE ONLY): 19 min   Charges:   PT Evaluation $Initial PT Evaluation Tier I: 1 Procedure     PT G CodesDespina Pole Oct 30, 2014, 7:34 PM  Carita Pian. Sanjuana Kava, [place] Pager [phone]

## 2014-10-05 LAB — CBC
HCT: 33.9 % — ABNORMAL LOW (ref 39.0–52.0)
Hemoglobin: 12.3 g/dL — ABNORMAL LOW (ref 13.0–17.0)
MCH: 31.9 pg (ref 26.0–34.0)
MCHC: 36.3 g/dL — ABNORMAL HIGH (ref 30.0–36.0)
MCV: 87.8 fL (ref 78.0–100.0)
Platelets: 100 10*3/uL — ABNORMAL LOW (ref 150–400)
RBC: 3.86 MIL/uL — AB (ref 4.22–5.81)
RDW: 14 % (ref 11.5–15.5)
WBC: 18.5 10*3/uL — AB (ref 4.0–10.5)

## 2014-10-05 LAB — BASIC METABOLIC PANEL
Anion gap: 4 — ABNORMAL LOW (ref 5–15)
BUN: 11 mg/dL (ref 6–23)
CALCIUM: 8.4 mg/dL (ref 8.4–10.5)
CO2: 23 mmol/L (ref 19–32)
Chloride: 109 mmol/L (ref 96–112)
Creatinine, Ser: 1.21 mg/dL (ref 0.50–1.35)
GFR, EST AFRICAN AMERICAN: 65 mL/min — AB (ref >90)
GFR, EST NON AFRICAN AMERICAN: 56 mL/min — AB (ref >90)
GLUCOSE: 216 mg/dL — AB (ref 70–99)
POTASSIUM: 3.4 mmol/L — AB (ref 3.5–5.1)
Sodium: 136 mmol/L (ref 135–145)

## 2014-10-05 MED ORDER — POTASSIUM CHLORIDE CRYS ER 20 MEQ PO TBCR
40.0000 meq | EXTENDED_RELEASE_TABLET | Freq: Once | ORAL | Status: AC
  Administered 2014-10-05: 40 meq via ORAL
  Filled 2014-10-05: qty 2

## 2014-10-05 MED ORDER — CEFUROXIME AXETIL 500 MG PO TABS
500.0000 mg | ORAL_TABLET | Freq: Two times a day (BID) | ORAL | Status: DC
  Filled 2014-10-05 (×2): qty 1

## 2014-10-05 MED ORDER — CEFUROXIME AXETIL 500 MG PO TABS: 500.0000 mg | ORAL_TABLET | Freq: Two times a day (BID) | ORAL | Status: DC

## 2014-10-05 MED ORDER — MAGNESIUM SULFATE IN D5W 10-5 MG/ML-% IV SOLN
1.0000 g | Freq: Once | INTRAVENOUS | Status: AC
  Administered 2014-10-05: 1 g via INTRAVENOUS
  Filled 2014-10-05: qty 100

## 2014-10-05 NOTE — Progress Notes (Signed)
UR review completed. 

## 2014-10-05 NOTE — Discharge Summary (Addendum)
Physician Discharge Summary  Darryl [name] HLK:[id number] DOB: 30-Mar-1935 DOA: 10/03/2014  PCP: Tawanna Solo, MD  Admit date: 10/03/2014 Discharge date: 10/05/2014  Time spent: 35 minutes  Recommendations for Outpatient Follow-up:  Cbc, bmp 1 week Schedule follow up with urology D/c with foley Outpatient PT  Discharge Diagnoses:  Principal Problem:   Sepsis Active Problems:   HTN (hypertension)   Dyslipidemia   Parkinson's disease   Coronary atherosclerosis of native coronary artery   CKD (chronic kidney disease), stage III   UTI (lower urinary tract infection)   Abnormal EKG   Urinary retention   BPH (benign prostatic hypertrophy)   Discharge Condition: improved  Diet recommendation: cardiac  Filed Weights   10/03/14 1659 10/03/14 2029 10/04/14 2100  Weight: 90.719 kg (200 lb) 94.2 kg (207 lb 10.8 oz) 93.9 kg (207 lb 0.2 oz)    History of present illness:  Darryl [name] is a 80 y.o. male with a history of Parkinson's , CAD, HTN, and Stage III CKD who presents to the ED with complaints of nausea and vomiting and Lower ABD Pain x 1 day. His vomiting was dry heaves. He also had weakness and fatigue, and near syncope. He was found to have fever in the ED with a temperature of 102.5, and a Leukocytosis of 21.2 K. A UA was also positive. He had been having urinary retention problems and was seen in the ED on 04/03 and a foley catheter was placed and he was seen yesterday by urology and the foley catheter was removed.   Hospital Course:  Sepsis: Secondary to UTI/urinary retention: Blood pressure stable. Cultures NGTD, PO abx for total of 7 days Active Problems:  UTI (lower urinary tract infection) with BPH and urinary retention:  - Foley at discharge. -Dr. Karsten Ro on Thursday  Leukocytosis -cbc next week to ensure resolution -decreasing   8 beats of v tac -Beta blocker -K was 3.4, replace along with Mg- recheck as outpatient   HTN  (hypertension)   Dyslipidemia   Parkinson's disease:outpatient PT   Coronary atherosclerosis of native coronary artery: Followed by Dr. Radford Pax   CKD (chronic kidney disease), stage III  Procedures:    Consultations:    Discharge Exam: Filed Vitals:   10/05/14 0900  BP: 162/73  Pulse: 71  Temp: 99.7 F (37.6 C)  Resp: 18    General: awake, feeling better, wants to go home Cardiovascular: rrr Respiratory: clear  Discharge Instructions   Discharge Instructions    Diet - low sodium heart healthy    Complete by:  As directed      Discharge instructions    Complete by:  As directed   Cbc, bmp 1 week Schedule follow up with urology D/c with foley Outpatient PT     Increase activity slowly    Complete by:  As directed           Current Discharge Medication List    START taking these medications   Details  cefUROXime (CEFTIN) 500 MG tablet Take 1 tablet (500 mg total) by mouth 2 (two) times daily with a meal. Qty: 10 tablet, Refills: 0      CONTINUE these medications which have NOT CHANGED   Details  acetaminophen (TYLENOL) 500 MG tablet Take 1,000 mg by mouth every 8 (eight) hours as needed for mild pain or moderate pain.    aspirin 81 MG tablet Take one tablet (81 mg) by mouth daily on Monday, Wednesday, & fridays    citalopram (CELEXA)  20 MG tablet Take 20 mg by mouth daily.    cloNIDine (CATAPRES) 0.2 MG tablet Take 0.2 mg by mouth as directed. Takes 1 tablet in AM and 1.5 tablets in PM daily    finasteride (PROSCAR) 5 MG tablet Take 5 mg by mouth daily.    irbesartan (AVAPRO) 300 MG tablet Take 300 mg by mouth every morning.    metoprolol (LOPRESSOR) 50 MG tablet Take 50 mg by mouth 2 (two) times daily.    Omega-3 Fatty Acids (FISH OIL PO) Take 800 mg by mouth daily. Carlson's Liquid    pantoprazole (PROTONIX) 40 MG tablet Take 40 mg by mouth every morning.    tamsulosin (FLOMAX) 0.4 MG CAPS capsule Take 1 capsule (0.4 mg total) by mouth  daily. Qty: 40 capsule, Refills: 0    traMADol (ULTRAM) 50 MG tablet Take 50 mg by mouth daily as needed.       STOP taking these medications     terazosin (HYTRIN) 2 MG capsule        Allergies  Allergen Reactions  . Aspirin Other (See Comments)    ulcers  . Celebrex [Celecoxib] Other (See Comments)    Due to ulcer.  . Nsaids Other (See Comments)    Due to ulcer.  . Toprol Xl [Metoprolol Tartrate] Swelling  . Altace [Ramipril]   . Levothyroxine Other (See Comments)    "made him feel bad"  . Biaxin [Clarithromycin] Nausea Only  . Tetracyclines & Related Nausea Only   Follow-up Information    Follow up with Claybon Jabs, MD.   Specialty:  Urology   Why:  April 14th at 8:30   Contact information:   [place] [address] [phone]       Follow up with Tawanna Solo, MD In 1 week.   Specialty:  Family Medicine   Why:  cbc (wbc) and BMP   Contact information:   [place] [address] [phone]        The results of significant diagnostics from this hospitalization (including imaging, microbiology, ancillary and laboratory) are listed below for reference.    Significant Diagnostic Studies: Dg Chest 2 View  10/03/2014   CLINICAL DATA:  Vomiting, nausea, shortness of breath,  EXAM: CHEST  2 VIEW  COMPARISON:  04/27/2013  FINDINGS: Cardiomediastinal silhouette is stable. Status post CABG again noted. No acute infiltrate or pleural effusion. No pulmonary edema. Bony thorax is stable.  IMPRESSION: No active cardiopulmonary disease.   Electronically Signed   By: Lahoma Crocker M.D.   On: 10/03/2014 18:02   [name]  09/30/2014   CLINICAL DATA:  Urinary retention with nausea, vomiting and abdominal pain today. History of back surgery. Initial encounter.  EXAM: MRI LUMBAR SPINE WITHOUT CONTRAST  TECHNIQUE: Multiplanar, multisequence MR imaging of the lumbar spine was performed. No intravenous contrast was administered.   COMPARISON:  Lumbar spine MRI 06/21/2013. Abdominal pelvic CT 09/08/2010.  FINDINGS: Five lumbar type vertebral bodies are assumed. The alignment is stable. There are resolving postsurgical changes within the right posterior paraspinal soft tissues at L4-5. Chronic L5 pars defects noted.  The conus medullaris extends to the L1-2 level and appears normal. No acute paraspinal abnormalities identified. Duodenal diverticulum, mild renal cortical thinning and mild aortic ectasia noted.  L1-2: Stable mild disc bulging, anterior osteophytes and facet/ ligamentous hypertrophy. No significant spinal stenosis or nerve root encroachment.  L2-3: Disc height is relatively maintained. There is stable mild disc  bulging eccentric to the left and mild facet and ligamentous hypertrophy. No significant spinal stenosis or nerve root encroachment.  L3-4: Disc height is relatively maintained. There is stable mild disc bulging eccentric to the left and mild facet and ligamentous hypertrophy. Stable mild foraminal narrowing bilaterally.  L4-5: Resolving postsurgical changes on the right status post apparent partial facetectomy and lateral discectomy. Right sided foraminal narrowing appears mildly improved. The left foramen is patent. The spinal canal and lateral recesses are widely patent.  L5-S1: Chronic bilateral pars defects. Minimal disc bulging with relatively maintained height and no significant foraminal compromise or nerve root encroachment.  IMPRESSION: 1. No acute findings or explanation for the patient's symptoms. The distal thoracic cord and conus medullaris appear normal. 2. Resolving postsurgical changes on the right at L4-5. There is some residual right-sided foraminal narrowing which appears improved from the most recent MRI.   Electronically Signed   By: Richardean Sale M.D.   On: 09/30/2014 13:26    Microbiology: Recent Results (from the past 240 hour(s))  Urine culture     Status: None   Collection Time: 09/30/14   9:13 AM  Result Value Ref Range Status   Specimen Description URINE, CATHETERIZED  Final   Special Requests NONE  Final   Colony Count NO GROWTH Performed at [facility]   Final   Culture NO GROWTH Performed at [facility]   Final   Report Status 10/01/2014 FINAL  Final  Urine culture     Status: None   Collection Time: 10/03/14  4:58 PM  Result Value Ref Range Status   Specimen Description URINE, RANDOM  Final   Special Requests NONE  Final   Colony Count   Final    >=100,000 COLONIES/ML Performed at [facility]    Culture   Final    Multiple bacterial morphotypes present, none predominant. Suggest appropriate recollection if clinically indicated. Performed at [facility]    Report Status 10/04/2014 FINAL  Final  Blood culture (routine x 2)     Status: None (Preliminary result)   Collection Time: 10/03/14  6:58 PM  Result Value Ref Range Status   Specimen Description BLOOD LEFT HAND  Final   Special Requests BOTTLES DRAWN AEROBIC AND ANAEROBIC 5CC  Final   Culture   Final           BLOOD CULTURE RECEIVED NO GROWTH TO DATE CULTURE WILL BE HELD FOR 5 DAYS BEFORE ISSUING A FINAL NEGATIVE REPORT Performed at [facility]    Report Status PENDING  Incomplete  Blood culture (routine x 2)     Status: None (Preliminary result)   Collection Time: 10/03/14  7:56 PM  Result Value Ref Range Status   Specimen Description BLOOD HAND RIGHT  Final   Special Requests   Final    BOTTLES DRAWN AEROBIC AND ANAEROBIC 3CC PT ON ROCEPHIN   Culture   Final           BLOOD CULTURE RECEIVED NO GROWTH TO DATE CULTURE WILL BE HELD FOR 5 DAYS BEFORE ISSUING A FINAL NEGATIVE REPORT Performed at [facility]    Report Status PENDING  Incomplete     Labs: Basic Metabolic Panel:  Recent Labs Lab 09/30/14 0921 10/03/14 1650 10/04/14 0624 10/05/14 1040  NA 138 136 136 136  K 3.6 3.9 3.9 3.4*  CL 101 103 109 109  CO2  --  27 19 23   GLUCOSE  159* 145* 153* 216*  BUN 21 13 16 11   CREATININE 1.20 1.34 1.35 1.21  CALCIUM  --  9.1 8.5 8.4   Liver Function Tests:  Recent Labs Lab 10/03/14 1650  AST 27  ALT 26  ALKPHOS 91  BILITOT 1.2  PROT 6.8  ALBUMIN 3.6    Recent Labs Lab 10/03/14 1650  LIPASE 33   No results for input(s): AMMONIA in the last 168 hours. CBC:  Recent Labs Lab 09/30/14 0900 09/30/14 0921 10/03/14 1650 10/04/14 0624 10/05/14 0415  WBC 14.4*  --  21.2* 22.5* 18.5*  NEUTROABS 12.0*  --  19.0*  --   --   HGB 16.8 16.3 14.7 12.7* 12.3*  HCT 45.6 48.0 39.8 35.8* 33.9*  MCV 87.7  --  88.4 89.5 87.8  PLT PLATELET CLUMPS NOTED ON SMEAR, UNABLE TO ESTIMATE  --  121* 117* 100*   Cardiac Enzymes: No results for input(s): CKTOTAL, CKMB, CKMBINDEX, TROPONINI in the last 168 hours. BNP: BNP (last 3 results) No results for input(s): BNP in the last 8760 hours.  ProBNP (last 3 results) No results for input(s): PROBNP in the last 8760 hours.  CBG: No results for input(s): GLUCAP in the last 168 hours.     SignedEulogio Bear  [facility] 10/05/2014, 2:09 PM

## 2014-10-05 NOTE — Progress Notes (Signed)
10/05/2014 3:58 PM Darryl [name] to be D/C'd Home per MD order.  Discussed prescriptions and follow up appointments with the patient. Prescriptions given to patient, medication list explained in detail. Pt verbalized understanding.    Medication List    STOP taking these medications        terazosin 2 MG capsule  Commonly known as:  HYTRIN      TAKE these medications        acetaminophen 500 MG tablet  Commonly known as:  TYLENOL  Take 1,000 mg by mouth every 8 (eight) hours as needed for mild pain or moderate pain.     aspirin 81 MG tablet  Take one tablet (81 mg) by mouth daily on Monday, Wednesday, & fridays     cefUROXime 500 MG tablet  Commonly known as:  CEFTIN  Take 1 tablet (500 mg total) by mouth 2 (two) times daily with a meal.     citalopram 20 MG tablet  Commonly known as:  CELEXA  Take 20 mg by mouth daily.     cloNIDine 0.2 MG tablet  Commonly known as:  CATAPRES  Take 0.2 mg by mouth as directed. Takes 1 tablet in AM and 1.5 tablets in PM daily     finasteride 5 MG tablet  Commonly known as:  PROSCAR  Take 5 mg by mouth daily.     FISH OIL PO  Take 800 mg by mouth daily. Carlson's Liquid     irbesartan 300 MG tablet  Commonly known as:  AVAPRO  Take 300 mg by mouth every morning.     metoprolol 50 MG tablet  Commonly known as:  LOPRESSOR  Take 50 mg by mouth 2 (two) times daily.     pantoprazole 40 MG tablet  Commonly known as:  PROTONIX  Take 40 mg by mouth every morning.     tamsulosin 0.4 MG Caps capsule  Commonly known as:  FLOMAX  Take 1 capsule (0.4 mg total) by mouth daily.     traMADol 50 MG tablet  Commonly known as:  ULTRAM  Take 50 mg by mouth daily as needed.        Filed Vitals:   10/05/14 0900  BP: 162/73  Pulse: 71  Temp: 99.7 F (37.6 C)  Resp: 18    Skin clean, dry and intact without evidence of skin break down, no evidence of skin tears noted. IV catheter discontinued intact. Site without signs and symptoms of  complications. Dressing and pressure applied. Pt denies pain at this time. No complaints noted. Discharging with foley per MD orders   An After Visit Summary was printed and given to the patient. Patient escorted via [place], and D/C home via private auto.  [name], Darryl Bergdoll 10/05/2014 3:58 PM

## 2014-10-05 NOTE — Care Management Note (Addendum)
CARE MANAGEMENT NOTE 10/05/2014  Patient:  Darryl [name], Darryl [name]   Account Number:  [id number]  Date Initiated:  10/05/2014  Documentation initiated by:  Corretta Munce  Subjective/Objective Assessment:   CM following for progression and d/c planning.     Action/Plan:   10/05/14 Met with pt and wife, re d/c needs, she is able to transport him to Outpatient Rehab when ordered. They request walker for use at home when needed. Order entered.   Anticipated DC Date:  10/07/2014   Anticipated DC Plan:  HOME/SELF CARE         Choice offered to / List presented to:  C-1 Patient   DME arranged  Vassie Moselle      DME agency  [place].        Status of service:  Completed, signed off Medicare Important Message given?  YES (If response is "NO", the following Medicare IM given date fields will be blank) Date Medicare IM given:  10/05/2014 Medicare IM given by:  Danita Proud Date Additional Medicare IM given:   Additional Medicare IM given by:    Discharge Disposition:  HOME/SELF CARE  Per UR Regulation:    If discussed at Long Length of Stay Meetings, dates discussed:    Comments:  10/05/14 Plan for d/c today, now with arrhy , IM given. Pt wife states that she can get him to outpatient PT, this CM will fax info to [facility], they are requesting a walker to use as needed at home. Walker ordered. CRoyal RN MPH case manager, [phone]    PT recommending Outpatient PT for Parkinsons, await order and will send to [facility] re[facility].   CRoyal RN MPH, case manager, [phone]

## 2014-10-05 NOTE — Progress Notes (Signed)
Physical Therapy Treatment Patient Details Name: ERAGON HAMMOND MRN: [id number] DOB: March 05, 1935 Today's Date: 10/05/2014    History of Present Illness Patient is a 80 yo male admitted 10/03/14 with N/V, sepsis, UTI, lower abdominal pain.  PMH:  recent fall, Parkinson's disease, CAD, HTN, CKD, spinal stenosis, lumbar laminectomy, stroke/ICH.    PT Comments    Patient making good gains with mobility.  Recommend OP PT for balance and mobility training. (OP Neuro Rehab has Parkinson's program - educated patient and wife.)  Follow Up Recommendations  Outpatient PT;Supervision/Assistance - 24 hour     Equipment Recommendations  Rolling walker with 5" wheels    Recommendations for Other Services       Precautions / Restrictions Precautions Precautions: Fall Restrictions Weight Bearing Restrictions: No    Mobility  Bed Mobility Overal bed mobility: Needs Assistance Bed Mobility: Supine to Sit;Sit to Supine     Supine to sit: Min assist Sit to supine: Supervision   General bed mobility comments: Assist to raise trunk to sitting position. Verbal cues for technique for patient to use UE's to assist with transition. Once upright, good balance.  No physical assist to return to supine.  Transfers Overall transfer level: Needs assistance Equipment used: None Transfers: Sit to/from Stand Sit to Stand: Supervision         General transfer comment: Verbal cues for hand placement.  Good balance in stance.  Ambulation/Gait Ambulation/Gait assistance: Min guard Ambulation Distance (Feet): 200 Feet Assistive device: None Gait Pattern/deviations: Step-through pattern;Decreased stride length Gait velocity: Decreased Gait velocity interpretation: Below normal speed for age/gender General Gait Details: Patient with slow, guarded gait.  Balance fair with gait.   Stairs            Wheelchair Mobility    Modified Rankin (Stroke Patients Only)       Balance            Standing balance support: No upper extremity supported Standing balance-Leahy Scale: Fair Standing balance comment: Slightly unsteady with high level balance activities - turning head, changing speed, etc.                    Cognition Arousal/Alertness: Awake/alert Behavior During Therapy: WFL for tasks assessed/performed Overall Cognitive Status: Within Functional Limits for tasks assessed                      Exercises      General Comments        Pertinent Vitals/Pain Pain Assessment: No/denies pain    Home Living                      Prior Function            PT Goals (current goals can now be found in the care plan section) Progress towards PT goals: Progressing toward goals    Frequency  Min 3X/week    PT Plan Current plan remains appropriate    Co-evaluation             End of Session Equipment Utilized During Treatment: Gait belt Activity Tolerance: Patient tolerated treatment well Patient left: in bed;with call bell/phone within reach;with family/visitor present     Time: 7106-2694 PT Time Calculation (min) (ACUTE ONLY): 30 min  Charges:  $Gait Training: 8-22 mins $Therapeutic Activity: 8-22 mins                    G Codes:  Despina Pole 10/05/2014, 3:09 PM Carita Pian. Sanjuana Kava, [place] Pager [phone]

## 2014-10-07 ENCOUNTER — Emergency Department (HOSPITAL_COMMUNITY): Payer: Medicare Other

## 2014-10-07 ENCOUNTER — Encounter (HOSPITAL_COMMUNITY): Payer: Self-pay | Admitting: *Deleted

## 2014-10-07 ENCOUNTER — Inpatient Hospital Stay (HOSPITAL_COMMUNITY)
Admission: EM | Admit: 2014-10-07 | Discharge: 2014-10-13 | DRG: 871 | Disposition: A | Discharge status: Home / Self Care | Payer: Medicare Other | Attending: Internal Medicine | Admitting: Internal Medicine

## 2014-10-07 DIAGNOSIS — Z9049 Acquired absence of other specified parts of digestive tract: Secondary | ICD-10-CM | POA: Diagnosis present

## 2014-10-07 DIAGNOSIS — E876 Hypokalemia: Secondary | ICD-10-CM | POA: Diagnosis present

## 2014-10-07 DIAGNOSIS — N390 Urinary tract infection, site not specified: Secondary | ICD-10-CM | POA: Diagnosis present

## 2014-10-07 DIAGNOSIS — Z951 Presence of aortocoronary bypass graft: Secondary | ICD-10-CM | POA: Diagnosis not present

## 2014-10-07 DIAGNOSIS — R338 Other retention of urine: Secondary | ICD-10-CM | POA: Diagnosis present

## 2014-10-07 DIAGNOSIS — E860 Dehydration: Secondary | ICD-10-CM | POA: Diagnosis present

## 2014-10-07 DIAGNOSIS — D696 Thrombocytopenia, unspecified: Secondary | ICD-10-CM | POA: Diagnosis present

## 2014-10-07 DIAGNOSIS — Z87891 Personal history of nicotine dependence: Secondary | ICD-10-CM | POA: Diagnosis not present

## 2014-10-07 DIAGNOSIS — G43909 Migraine, unspecified, not intractable, without status migrainosus: Secondary | ICD-10-CM | POA: Diagnosis present

## 2014-10-07 DIAGNOSIS — I129 Hypertensive chronic kidney disease with stage 1 through stage 4 chronic kidney disease, or unspecified chronic kidney disease: Secondary | ICD-10-CM | POA: Diagnosis present

## 2014-10-07 DIAGNOSIS — Z8711 Personal history of peptic ulcer disease: Secondary | ICD-10-CM

## 2014-10-07 DIAGNOSIS — N401 Enlarged prostate with lower urinary tract symptoms: Secondary | ICD-10-CM | POA: Diagnosis present

## 2014-10-07 DIAGNOSIS — N3000 Acute cystitis without hematuria: Secondary | ICD-10-CM | POA: Diagnosis not present

## 2014-10-07 DIAGNOSIS — R339 Retention of urine, unspecified: Secondary | ICD-10-CM | POA: Diagnosis not present

## 2014-10-07 DIAGNOSIS — Z86010 Personal history of colonic polyps: Secondary | ICD-10-CM | POA: Diagnosis not present

## 2014-10-07 DIAGNOSIS — E669 Obesity, unspecified: Secondary | ICD-10-CM | POA: Diagnosis present

## 2014-10-07 DIAGNOSIS — Z6831 Body mass index (BMI) 31.0-31.9, adult: Secondary | ICD-10-CM | POA: Diagnosis not present

## 2014-10-07 DIAGNOSIS — E785 Hyperlipidemia, unspecified: Secondary | ICD-10-CM | POA: Diagnosis present

## 2014-10-07 DIAGNOSIS — G20 Parkinson's disease: Secondary | ICD-10-CM | POA: Diagnosis not present

## 2014-10-07 DIAGNOSIS — M4306 Spondylolysis, lumbar region: Secondary | ICD-10-CM | POA: Diagnosis present

## 2014-10-07 DIAGNOSIS — I5033 Acute on chronic diastolic (congestive) heart failure: Secondary | ICD-10-CM | POA: Diagnosis present

## 2014-10-07 DIAGNOSIS — Z8673 Personal history of transient ischemic attack (TIA), and cerebral infarction without residual deficits: Secondary | ICD-10-CM

## 2014-10-07 DIAGNOSIS — I447 Left bundle-branch block, unspecified: Secondary | ICD-10-CM | POA: Diagnosis present

## 2014-10-07 DIAGNOSIS — R404 Transient alteration of awareness: Secondary | ICD-10-CM | POA: Diagnosis not present

## 2014-10-07 DIAGNOSIS — G4700 Insomnia, unspecified: Secondary | ICD-10-CM | POA: Diagnosis present

## 2014-10-07 DIAGNOSIS — I10 Essential (primary) hypertension: Secondary | ICD-10-CM | POA: Diagnosis not present

## 2014-10-07 DIAGNOSIS — N183 Chronic kidney disease, stage 3 unspecified: Secondary | ICD-10-CM | POA: Diagnosis present

## 2014-10-07 DIAGNOSIS — R509 Fever, unspecified: Secondary | ICD-10-CM | POA: Diagnosis not present

## 2014-10-07 DIAGNOSIS — I5032 Chronic diastolic (congestive) heart failure: Secondary | ICD-10-CM | POA: Diagnosis not present

## 2014-10-07 DIAGNOSIS — R0602 Shortness of breath: Secondary | ICD-10-CM | POA: Diagnosis not present

## 2014-10-07 DIAGNOSIS — Z886 Allergy status to analgesic agent status: Secondary | ICD-10-CM

## 2014-10-07 DIAGNOSIS — N189 Chronic kidney disease, unspecified: Secondary | ICD-10-CM | POA: Diagnosis not present

## 2014-10-07 DIAGNOSIS — M4806 Spinal stenosis, lumbar region: Secondary | ICD-10-CM | POA: Diagnosis present

## 2014-10-07 DIAGNOSIS — R319 Hematuria, unspecified: Secondary | ICD-10-CM | POA: Diagnosis present

## 2014-10-07 DIAGNOSIS — I252 Old myocardial infarction: Secondary | ICD-10-CM

## 2014-10-07 DIAGNOSIS — D72829 Elevated white blood cell count, unspecified: Secondary | ICD-10-CM | POA: Diagnosis present

## 2014-10-07 DIAGNOSIS — R17 Unspecified jaundice: Secondary | ICD-10-CM | POA: Diagnosis present

## 2014-10-07 DIAGNOSIS — M1990 Unspecified osteoarthritis, unspecified site: Secondary | ICD-10-CM | POA: Diagnosis present

## 2014-10-07 DIAGNOSIS — N179 Acute kidney failure, unspecified: Secondary | ICD-10-CM | POA: Diagnosis present

## 2014-10-07 DIAGNOSIS — N2889 Other specified disorders of kidney and ureter: Secondary | ICD-10-CM | POA: Diagnosis not present

## 2014-10-07 DIAGNOSIS — Z955 Presence of coronary angioplasty implant and graft: Secondary | ICD-10-CM

## 2014-10-07 DIAGNOSIS — Z888 Allergy status to other drugs, medicaments and biological substances status: Secondary | ICD-10-CM

## 2014-10-07 DIAGNOSIS — Z7982 Long term (current) use of aspirin: Secondary | ICD-10-CM

## 2014-10-07 DIAGNOSIS — R05 Cough: Secondary | ICD-10-CM | POA: Diagnosis not present

## 2014-10-07 DIAGNOSIS — I5189 Other ill-defined heart diseases: Secondary | ICD-10-CM | POA: Diagnosis present

## 2014-10-07 DIAGNOSIS — A419 Sepsis, unspecified organism: Secondary | ICD-10-CM | POA: Diagnosis not present

## 2014-10-07 DIAGNOSIS — I2510 Atherosclerotic heart disease of native coronary artery without angina pectoris: Secondary | ICD-10-CM | POA: Diagnosis not present

## 2014-10-07 DIAGNOSIS — R531 Weakness: Secondary | ICD-10-CM | POA: Diagnosis not present

## 2014-10-07 LAB — URINALYSIS, ROUTINE W REFLEX MICROSCOPIC
Bilirubin Urine: NEGATIVE
Glucose, UA: NEGATIVE mg/dL
Ketones, ur: NEGATIVE mg/dL
Nitrite: NEGATIVE
Protein, ur: 30 mg/dL — AB
Specific Gravity, Urine: 1.019 (ref 1.005–1.030)
Urobilinogen, UA: 0.2 mg/dL (ref 0.0–1.0)
pH: 5.5 (ref 5.0–8.0)

## 2014-10-07 LAB — URINE MICROSCOPIC-ADD ON

## 2014-10-07 LAB — CBC WITH DIFFERENTIAL/PLATELET
Basophils Absolute: 0 10*3/uL (ref 0.0–0.1)
Basophils Relative: 0 % (ref 0–1)
Eosinophils Absolute: 0 10*3/uL (ref 0.0–0.7)
Eosinophils Relative: 0 % (ref 0–5)
HCT: 35.5 % — ABNORMAL LOW (ref 39.0–52.0)
Hemoglobin: 13.1 g/dL (ref 13.0–17.0)
Lymphocytes Relative: 4 % — ABNORMAL LOW (ref 12–46)
Lymphs Abs: 0.5 10*3/uL — ABNORMAL LOW (ref 0.7–4.0)
MCH: 32.2 pg (ref 26.0–34.0)
MCHC: 36.9 g/dL — ABNORMAL HIGH (ref 30.0–36.0)
MCV: 87.2 fL (ref 78.0–100.0)
Monocytes Absolute: 1 10*3/uL (ref 0.1–1.0)
Monocytes Relative: 9 % (ref 3–12)
Neutro Abs: 10.2 10*3/uL — ABNORMAL HIGH (ref 1.7–7.7)
Neutrophils Relative %: 86 % — ABNORMAL HIGH (ref 43–77)
Platelets: 145 10*3/uL — ABNORMAL LOW (ref 150–400)
RBC: 4.07 MIL/uL — ABNORMAL LOW (ref 4.22–5.81)
RDW: 13.7 % (ref 11.5–15.5)
WBC: 11.8 10*3/uL — ABNORMAL HIGH (ref 4.0–10.5)

## 2014-10-07 LAB — COMPREHENSIVE METABOLIC PANEL
ALT: 32 U/L (ref 0–53)
AST: 55 U/L — ABNORMAL HIGH (ref 0–37)
Albumin: 2.8 g/dL — ABNORMAL LOW (ref 3.5–5.2)
Alkaline Phosphatase: 102 U/L (ref 39–117)
Anion gap: 9 (ref 5–15)
BUN: 11 mg/dL (ref 6–23)
CO2: 23 mmol/L (ref 19–32)
Calcium: 8.6 mg/dL (ref 8.4–10.5)
Chloride: 103 mmol/L (ref 96–112)
Creatinine, Ser: 1.41 mg/dL — ABNORMAL HIGH (ref 0.50–1.35)
GFR calc Af Amer: 54 mL/min — ABNORMAL LOW (ref >90)
GFR calc non Af Amer: 47 mL/min — ABNORMAL LOW (ref >90)
Glucose, Bld: 143 mg/dL — ABNORMAL HIGH (ref 70–99)
Potassium: 4.3 mmol/L (ref 3.5–5.1)
Sodium: 135 mmol/L (ref 135–145)
Total Bilirubin: 1.9 mg/dL — ABNORMAL HIGH (ref 0.3–1.2)
Total Protein: 6.2 g/dL (ref 6.0–8.3)

## 2014-10-07 LAB — I-STAT TROPONIN, ED: TROPONIN I, POC: 0.07 ng/mL (ref 0.00–0.08)

## 2014-10-07 LAB — I-STAT CG4 LACTIC ACID, ED
Lactic Acid, Venous: 1.48 mmol/L (ref 0.5–2.0)
Lactic Acid, Venous: 0.77 mmol/L (ref 0.5–2.0)

## 2014-10-07 MED ORDER — SODIUM CHLORIDE 0.9 % IV SOLN
Freq: Once | INTRAVENOUS | Status: AC
  Administered 2014-10-07: 15:00:00 via INTRAVENOUS

## 2014-10-07 MED ORDER — CLONIDINE HCL 0.3 MG PO TABS
0.3000 mg | ORAL_TABLET | Freq: Every day | ORAL | Status: DC
  Administered 2014-10-07 – 2014-10-08 (×2): 0.3 mg via ORAL
  Filled 2014-10-07 (×4): qty 1

## 2014-10-07 MED ORDER — ASPIRIN 81 MG PO CHEW
81.0000 mg | CHEWABLE_TABLET | Freq: Every day | ORAL | Status: DC
  Filled 2014-10-07: qty 1

## 2014-10-07 MED ORDER — SODIUM CHLORIDE 0.9 % IJ SOLN
3.0000 mL | Freq: Two times a day (BID) | INTRAMUSCULAR | Status: DC
Start: 2014-10-07 — End: 2014-10-13
  Administered 2014-10-07 – 2014-10-13 (×8): 3 mL via INTRAVENOUS

## 2014-10-07 MED ORDER — ZOLPIDEM TARTRATE 5 MG PO TABS
5.0000 mg | ORAL_TABLET | Freq: Once | ORAL | Status: AC
  Administered 2014-10-07: 5 mg via ORAL
  Filled 2014-10-07: qty 1

## 2014-10-07 MED ORDER — SODIUM CHLORIDE 0.9 % IV SOLN: INTRAVENOUS | Status: AC

## 2014-10-07 MED ORDER — CITALOPRAM HYDROBROMIDE 20 MG PO TABS
20.0000 mg | ORAL_TABLET | Freq: Every day | ORAL | Status: DC
  Administered 2014-10-07 – 2014-10-13 (×7): 20 mg via ORAL
  Filled 2014-10-07 (×7): qty 1

## 2014-10-07 MED ORDER — METOPROLOL TARTRATE 50 MG PO TABS
50.0000 mg | ORAL_TABLET | Freq: Two times a day (BID) | ORAL | Status: DC
  Administered 2014-10-07 – 2014-10-13 (×11): 50 mg via ORAL
  Filled 2014-10-07 (×13): qty 1

## 2014-10-07 MED ORDER — TRAMADOL HCL 50 MG PO TABS: 50.0000 mg | ORAL_TABLET | Freq: Every day | ORAL | Status: DC | PRN

## 2014-10-07 MED ORDER — SODIUM CHLORIDE 0.9 % IV SOLN
INTRAVENOUS | Status: DC
  Administered 2014-10-07: 18:00:00 via INTRAVENOUS

## 2014-10-07 MED ORDER — DEXTROSE 5 % IV SOLN
1.0000 g | Freq: Once | INTRAVENOUS | Status: AC
  Administered 2014-10-07: 1 g via INTRAVENOUS
  Filled 2014-10-07: qty 10

## 2014-10-07 MED ORDER — DEXTROSE 5 % IV SOLN
2.0000 g | INTRAVENOUS | Status: DC
  Administered 2014-10-07 – 2014-10-08 (×2): 2 g via INTRAVENOUS
  Filled 2014-10-07 (×3): qty 2

## 2014-10-07 MED ORDER — CETYLPYRIDINIUM CHLORIDE 0.05 % MT LIQD
7.0000 mL | Freq: Two times a day (BID) | OROMUCOSAL | Status: DC
  Administered 2014-10-07 – 2014-10-12 (×10): 7 mL via OROMUCOSAL

## 2014-10-07 MED ORDER — HEPARIN SODIUM (PORCINE) 5000 UNIT/ML IJ SOLN
5000.0000 [IU] | Freq: Three times a day (TID) | INTRAMUSCULAR | Status: DC
  Administered 2014-10-07 – 2014-10-11 (×10): 5000 [IU] via SUBCUTANEOUS
  Filled 2014-10-07 (×14): qty 1

## 2014-10-07 MED ORDER — CLONIDINE HCL 0.2 MG PO TABS: 0.2000 mg | ORAL_TABLET | Freq: Two times a day (BID) | ORAL | Status: DC

## 2014-10-07 MED ORDER — FINASTERIDE 5 MG PO TABS
5.0000 mg | ORAL_TABLET | Freq: Every day | ORAL | Status: DC
  Administered 2014-10-07 – 2014-10-13 (×7): 5 mg via ORAL
  Filled 2014-10-07 (×7): qty 1

## 2014-10-07 MED ORDER — TAMSULOSIN HCL 0.4 MG PO CAPS
0.4000 mg | ORAL_CAPSULE | Freq: Every day | ORAL | Status: DC
  Administered 2014-10-08 – 2014-10-13 (×6): 0.4 mg via ORAL
  Filled 2014-10-07 (×6): qty 1

## 2014-10-07 MED ORDER — SODIUM CHLORIDE 0.9 % IV SOLN
Freq: Once | INTRAVENOUS | Status: AC
  Administered 2014-10-07: 13:00:00 via INTRAVENOUS

## 2014-10-07 MED ORDER — PANTOPRAZOLE SODIUM 40 MG PO TBEC
40.0000 mg | DELAYED_RELEASE_TABLET | Freq: Every morning | ORAL | Status: DC
  Administered 2014-10-08 – 2014-10-13 (×6): 40 mg via ORAL
  Filled 2014-10-07 (×5): qty 1

## 2014-10-07 MED ORDER — VANCOMYCIN HCL 10 G IV SOLR
1250.0000 mg | Freq: Once | INTRAVENOUS | Status: AC
  Administered 2014-10-07: 1250 mg via INTRAVENOUS
  Filled 2014-10-07 (×2): qty 1250

## 2014-10-07 MED ORDER — ASPIRIN EC 81 MG PO TBEC
81.0000 mg | DELAYED_RELEASE_TABLET | ORAL | Status: DC
  Administered 2014-10-08 – 2014-10-12 (×3): 81 mg via ORAL
  Filled 2014-10-07 (×3): qty 1

## 2014-10-07 MED ORDER — CLONIDINE HCL 0.2 MG PO TABS
0.2000 mg | ORAL_TABLET | Freq: Every day | ORAL | Status: DC
  Administered 2014-10-08: 0.2 mg via ORAL
  Filled 2014-10-07 (×2): qty 1

## 2014-10-07 MED ORDER — ASPIRIN EC 81 MG PO TBEC
81.0000 mg | DELAYED_RELEASE_TABLET | Freq: Every day | ORAL | Status: DC
  Filled 2014-10-07: qty 1

## 2014-10-07 MED ORDER — DEXTROSE 5 % IV SOLN
2.0000 g | INTRAVENOUS | Status: DC
  Filled 2014-10-07: qty 2

## 2014-10-07 MED ORDER — VANCOMYCIN HCL 10 G IV SOLR
1500.0000 mg | INTRAVENOUS | Status: DC
  Administered 2014-10-08 – 2014-10-09 (×2): 1500 mg via INTRAVENOUS
  Filled 2014-10-07 (×2): qty 1500

## 2014-10-07 MED ORDER — ACETAMINOPHEN 325 MG PO TABS
650.0000 mg | ORAL_TABLET | Freq: Three times a day (TID) | ORAL | Status: DC | PRN
  Administered 2014-10-07 – 2014-10-13 (×4): 650 mg via ORAL
  Filled 2014-10-07 (×4): qty 2

## 2014-10-07 NOTE — H&P (Signed)
History and Physical    Darryl [name]:[id number] DOB: 1934-12-04 DOA: 10/07/2014  Referring physician: Dr. Vanita Panda PCP: Tawanna Solo, MD  Specialists: none  Chief Complaint: weakness, fever  HPI: Darryl [name] is a 80 y.o. male has a past medical history significant for Parkinson's disease, coronary artery disease, hypertension, stage III chronic kidney disease, he was just hospitalized and discharged with his ago, he presented with nausea vomiting and lower abdominal pain, he was diagnosed with a UTI in the setting of urinary retention and was discharged home with a Foley catheter with outpatient urology follow-up as well as on by mouth Ceftin. Patient did well yesterday, had a low-grade temp of 99.1, however this morning he was increasingly weak and was febrile to 100 at home, he felt very fatigued and his wife decided to bring him to the hospital. He denies any chest pain, denies any respiratory distress, he endorses on and off wheezing that's been there for a long time. He denies any abdominal pain, has mild nausea, denies vomiting or diarrhea. He denies any lightheadedness or dizziness. He denies any numbness or tingling. He endorses feeling fatigued, endorses a poor appetite and wasn't able to eat or drink much since yesterday. In the emergency room, patient was found to have mild acute kidney injury with a creatinine of 1.4 from previous normal baseline at time of discharge, leukocytosis of 11.8 which is actually improved from the time of discharge, but he was febrile to 100.9. He is not tachycardic, his blood pressure stable. His urinalysis was positive for urinary tract infection. [facility] was asked for admission for UTI.   Review of Systems: as per HPI otherwise negative  Past Medical History  Diagnosis Date  . LBBB (left bundle branch block)   . Sleep disturbance   . Insomnia   . Tremor   . HTN (hypertension)     orthostatic  . Dyslipidemia   . Thrombocytopenia   .  Colon polyp   . Diverticulosis   . Renal insufficiency     renal duplex scan in 2012 showed no renal artery stenosis  . Coronary atherosclerosis of native coronary artery     a. s/p CABG 11/2003. b. s/p DES to radial-OM and SVG-RCA 11/05. c. s/p BMS to LCx 11/2008.  . Lumbar spondylolysis     multilevel DJD with mild spinal stenosis L3-L5  . Headache(784.0)   . Arthritis   . Thrombocytopenia   . Elevated fasting glucose   . Gastritis   . Hiatal hernia   . Migraines   . H/O urinary retention     post surgical procedures  . Parkinson's disease   . Stroke     a. remote hx of stroke reportedly r/t HTN.  . Intracranial hemorrhage     a. small pontine [place] 2003.  [name] Peptic ulcer disease     a. prior hx of bleeding ulcers, including 12/2003 in setting of aspirin use.  . Gastritis     a. by EGD 04/2004.  [name] Hypertensive heart disease   . Dilated aortic root 05/29/2014   Past Surgical History  Procedure Laterality Date  . Wisdom tooth extraction    . Esophagogastroduodenoscopy      10/2001, 12/2003, 04/2004  . Coronary artery bypass graft  2005  . Shoulder arthroscopy Left 9/10  . Appendectomy  62    1963  . Stones      hx  . Coronary angioplasty  6/10,10/05  . Cholecystectomy  1998  . Lumbar  laminectomy/decompression microdiscectomy N/A 05/03/2013    Procedure: LUMBAR LAMINECTOMY/DCOMPRESSION MICRODISCECTOMY RIGHT LUMBAR FOUR FIVE;  Surgeon: [name], MD;  Location: [facility];  Service: Neurosurgery;  Laterality: N/A;  . Colonoscopy  10/2001  . Colon surgery  03/14/2008   Social History:  reports that he quit smoking about 55 years ago. His smoking use included Cigarettes. He has a 10 pack-year smoking history. He has never used smokeless tobacco. He reports that he does not drink alcohol or use illicit drugs.  Allergies  Allergen Reactions  . Aspirin Other (See Comments)    ulcers  . Celebrex [Celecoxib] Other (See Comments)    Due to ulcer.  . Nsaids Other (See  Comments)    Due to ulcer.  . Toprol Xl [Metoprolol Tartrate] Swelling  . Altace [Ramipril]   . Levothyroxine Other (See Comments)    "made him feel bad"  . Biaxin [Clarithromycin] Nausea Only  . Tetracyclines & Related Nausea Only    Family History  Problem Relation Age of Onset  . CAD Father 30    ASCAD  . CAD Sister     Prior to Admission medications   Medication Sig Start Date End Date Taking? Authorizing Provider  acetaminophen (TYLENOL) 500 MG tablet Take 1,000 mg by mouth every 8 (eight) hours as needed for mild pain or moderate pain.   Yes Historical Provider, MD  aspirin 81 MG tablet Take one tablet (81 mg) by mouth daily on Monday, Wednesday, & fridays   Yes Historical Provider, MD  cefUROXime (CEFTIN) 500 MG tablet Take 1 tablet (500 mg total) by mouth 2 (two) times daily with a meal. 10/05/14  Yes Geradine Girt, DO  citalopram (CELEXA) 20 MG tablet Take 20 mg by mouth daily.   Yes Historical Provider, MD  cloNIDine (CATAPRES) 0.2 MG tablet Take 0.2-0.3 mg by mouth 2 (two) times daily. Takes 1 tablet in am and 1.5 tablets in PM daily   Yes Historical Provider, MD  finasteride (PROSCAR) 5 MG tablet Take 5 mg by mouth daily.   Yes Historical Provider, MD  irbesartan (AVAPRO) 300 MG tablet Take 300 mg by mouth every morning.   Yes Historical Provider, MD  metoprolol (LOPRESSOR) 50 MG tablet Take 50 mg by mouth 2 (two) times daily.   Yes Historical Provider, MD  Omega-3 Fatty Acids (FISH OIL PO) Take 800 mg by mouth daily. Carlson's Liquid   Yes Historical Provider, MD  pantoprazole (PROTONIX) 40 MG tablet Take 40 mg by mouth every morning.   Yes Historical Provider, MD  tamsulosin (FLOMAX) 0.4 MG CAPS capsule Take 1 capsule (0.4 mg total) by mouth daily. Patient taking differently: Take 0.8 mg by mouth daily.  05/04/13  Yes Kary Kos, MD  traMADol (ULTRAM) 50 MG tablet Take 50 mg by mouth daily as needed (pain).    Yes Historical Provider, MD   Physical Exam: Filed Vitals:    10/07/14 1400 10/07/14 1430 10/07/14 1500 10/07/14 1533  BP: 158/80 162/75 168/89 125/56  Pulse: 70 66 67   Temp:    100.9 F (38.3 C)  TempSrc:    Rectal  Resp: 24 21 19 16   Height:      Weight:      SpO2: 98% 99% 100% 100%     General:  No apparent distress, flushed  Eyes: PERRL, EOMI, no scleral icterus  ENT: dry oropharynx  Neck: supple, no lymphadenopathy  Cardiovascular: regular rate, 2/6 SEM; 2+ peripheral pulses, no JVD, no peripheral edema  Respiratory: CTA biL, good air movement without wheezing, rhonchi or crackled. Upper airway wheezing heard  Abdomen: soft, non tender to palpation, positive bowel sounds, no guarding, no rebound  Skin: no rashes  Musculoskeletal: normal bulk and tone, no joint swelling  Psychiatric: normal mood and affect  Neurologic: CN 2-12 grossly intact, MS 5/5 in all 4  Labs on Admission:  Basic Metabolic Panel:  Recent Labs Lab 10/03/14 1650 10/04/14 0624 10/05/14 1040 10/07/14 1305  NA 136 136 136 135  K 3.9 3.9 3.4* 4.3  CL 103 109 109 103  CO2 27 19 23 23   GLUCOSE 145* 153* 216* 143*  BUN 13 16 11 11   CREATININE 1.34 1.35 1.21 1.41*  CALCIUM 9.1 8.5 8.4 8.6   Liver Function Tests:  Recent Labs Lab 10/03/14 1650 10/07/14 1305  AST 27 55*  ALT 26 32  ALKPHOS 91 102  BILITOT 1.2 1.9*  PROT 6.8 6.2  ALBUMIN 3.6 2.8*    Recent Labs Lab 10/03/14 1650  LIPASE 33   CBC:  Recent Labs Lab 10/03/14 1650 10/04/14 0624 10/05/14 0415 10/07/14 1305  WBC 21.2* 22.5* 18.5* 11.8*  NEUTROABS 19.0*  --   --  10.2*  HGB 14.7 12.7* 12.3* 13.1  HCT 39.8 35.8* 33.9* 35.5*  MCV 88.4 89.5 87.8 87.2  PLT 121* 117* 100* 145*   Radiological Exams on Admission: Dg Chest Port 1 View  10/07/2014   CLINICAL DATA:  Cough, shortness of breath.  EXAM: PORTABLE CHEST - 1 VIEW  COMPARISON:  None.  FINDINGS: The heart size and mediastinal contours are within normal limits. No pneumothorax or pleural effusion is noted. Status post  coronary artery bypass graft. Both lungs are clear. The visualized skeletal structures are unremarkable.  IMPRESSION: No acute cardiopulmonary abnormality seen.   Electronically Signed   By: Marijo Conception, M.D.   On: 10/07/2014 14:38    EKG: Independently reviewed. LBBB  Assessment/Plan Principal Problem:   UTI (urinary tract infection) Active Problems:   HTN (hypertension)   Dyslipidemia   LBBB (left bundle branch block)   Parkinson's disease   Coronary atherosclerosis of native coronary artery   CKD (chronic kidney disease), stage III   Sepsis   Urinary retention  Urinary tract infection - he has fever, leukocytosis, is mentating at baseline. He has a Foley catheter in, seems to not responded well to Ceftin, we'll send urine cultures, blood cultures, place patient on empiric Vancomycin and Cefepime - urine cultures - blood cultures obtained in the ED  CAD - no chest pain, continue home medications - LBBB which is not new - few beats of VT last hospitalization, keep pn tele for 24 h, consider d/c 4/11 if no events.   CKD stage III - slightly dehydrated, provide gentle IVF and repeat BMP in a  Parkinson's disease - stable, followed as an outpatient  HTN - hold ARB due to AKI  BPH with urinary retention - supposed to have outpatient follow up with Urology this Thursday.   Diet: heart healthy Fluids: NS DVT Prophylaxis: heparin  Code Status: Full  Family Communication: d/w wife bedside  Disposition Plan: admit to tele    Costin M. Cruzita Lederer, MD [facility] Pager [phone]  If 7PM-7AM, please contact night-coverage [email or web address] Password [email or web address] 10/07/2014, 4:05 PM

## 2014-10-07 NOTE — ED Notes (Signed)
X RAY at bedside 

## 2014-10-07 NOTE — Progress Notes (Signed)
Report received from [place], [place] for admission to [id number]

## 2014-10-07 NOTE — ED Notes (Signed)
Pt arrives from home via [facility]. Pt was feeling weak while outside today and fell into a rose bush. Pt denies hitting head or loc. Pt states he was d/c from inpatient on Friday for a UTI and currently has a catheter in. Pt upon arrival is lethargic and falls asleep easily. Pt is aroused by stimulation. Pt has a skin tear on his right forearm. EMS was called for sob and upon arrival pt 02 sat was 93% on RA. Pt is now on 2L per [place] and is at 98%. Pt has been febrile and had a temperature of 101.5 F this morning and took tylenol to relieve fever.

## 2014-10-07 NOTE — ED Provider Notes (Signed)
CSN: [id number]     Arrival date & time 10/07/14  1226 History   First MD Initiated Contact with Patient 10/07/14 1227     No chief complaint on file.    HPI   80 year old male presents today with... She was recent seen in ED on April 3rd 2016 with urinary retention symptoms. He recently had a steroid injection in his back that proceeded the urinary retention. Significant MRI study that showed no acute findings, full catheter was placed and he was instructed follow-up with urology outpatient. He followed up with urology who removed his catheter at that time. In the ED on 10/03/2014 and admitted for sepsis. Review of laboratory results show no growth in one cultures, he was discharged on 10/05/2014 sent home on Ceftin and instructions to follow-up with urologist once again. Patient reports that yesterday patient again started feeling ill and weak, today he had a fever and was breathing more rapid with reported wheezing. He became too weak and collapsed in the yard. On presentation patient reports fever, chest pressure with deep inspiration; no pain without movement. He endorses extreme fatigue, denies abdominal distention or urinary retention symptoms and reports that his urine output via Foley has been normal. Patient's wife is present at time of evaluation reports that she has noticed the wheezing, and a change in his mental status, reporting that he has been "not quite himself". Patient has a significant past medical history of MI and stent placement in the late 2000. She reports that she gave him his antibiotics this morning along with Tylenol for the fever.   Past Medical History  Diagnosis Date  . LBBB (left bundle branch block)   . Sleep disturbance   . Insomnia   . Tremor   . HTN (hypertension)     orthostatic  . Dyslipidemia   . Thrombocytopenia   . Colon polyp   . Diverticulosis   . Renal insufficiency     renal duplex scan in 2012 showed no renal artery stenosis  . Coronary  atherosclerosis of native coronary artery     a. s/p CABG 11/2003. b. s/p DES to radial-OM and SVG-RCA 11/05. c. s/p BMS to LCx 11/2008.  . Lumbar spondylolysis     multilevel DJD with mild spinal stenosis L3-L5  . Headache(784.0)   . Arthritis   . Thrombocytopenia   . Elevated fasting glucose   . Gastritis   . Hiatal hernia   . Migraines   . H/O urinary retention     post surgical procedures  . Parkinson's disease   . Stroke     a. remote hx of stroke reportedly r/t HTN.  . Intracranial hemorrhage     a. small pontine [place] 2003.  [name] Peptic ulcer disease     a. prior hx of bleeding ulcers, including 12/2003 in setting of aspirin use.  . Gastritis     a. by EGD 04/2004.  [name] Hypertensive heart disease   . Dilated aortic root 05/29/2014   Past Surgical History  Procedure Laterality Date  . Wisdom tooth extraction    . Esophagogastroduodenoscopy      10/2001, 12/2003, 04/2004  . Coronary artery bypass graft  2005  . Shoulder arthroscopy Left 9/10  . Appendectomy  62    1963  . Stones      hx  . Coronary angioplasty  6/10,10/05  . Cholecystectomy  1998  . Lumbar laminectomy/decompression microdiscectomy N/A 05/03/2013    Procedure: LUMBAR LAMINECTOMY/DCOMPRESSION MICRODISCECTOMY RIGHT LUMBAR FOUR FIVE;  Surgeon: [name], MD;  Location: [facility];  Service: Neurosurgery;  Laterality: N/A;  . Colonoscopy  10/2001  . Colon surgery  03/14/2008   Family History  Problem Relation Age of Onset  . CAD Father 11    ASCAD  . CAD Sister    History  Substance Use Topics  . Smoking status: Former Smoker -- 2.00 packs/day for 5 years    Types: Cigarettes    Quit date: 04/28/1959  . Smokeless tobacco: Never Used     Comment: 55+ yrs ago  . Alcohol Use: No    Review of Systems  All other systems reviewed and are negative.   Allergies  Aspirin; Celebrex; Nsaids; Toprol xl; Altace; Levothyroxine; Biaxin; and Tetracyclines & related  Home Medications   Prior to Admission  medications   Medication Sig Start Date End Date Taking? Authorizing Provider  acetaminophen (TYLENOL) 500 MG tablet Take 1,000 mg by mouth every 8 (eight) hours as needed for mild pain or moderate pain.    Historical Provider, MD  aspirin 81 MG tablet Take one tablet (81 mg) by mouth daily on Monday, Wednesday, & fridays    Historical Provider, MD  cefUROXime (CEFTIN) 500 MG tablet Take 1 tablet (500 mg total) by mouth 2 (two) times daily with a meal. 10/05/14   Geradine Girt, DO  citalopram (CELEXA) 20 MG tablet Take 20 mg by mouth daily.    Historical Provider, MD  cloNIDine (CATAPRES) 0.2 MG tablet Take 0.2 mg by mouth as directed. Takes 1 tablet in AM and 1.5 tablets in PM daily    Historical Provider, MD  finasteride (PROSCAR) 5 MG tablet Take 5 mg by mouth daily.    Historical Provider, MD  irbesartan (AVAPRO) 300 MG tablet Take 300 mg by mouth every morning.    Historical Provider, MD  metoprolol (LOPRESSOR) 50 MG tablet Take 50 mg by mouth 2 (two) times daily.    Historical Provider, MD  Omega-3 Fatty Acids (FISH OIL PO) Take 800 mg by mouth daily. Carlson's Liquid    Historical Provider, MD  pantoprazole (PROTONIX) 40 MG tablet Take 40 mg by mouth every morning.    Historical Provider, MD  tamsulosin (FLOMAX) 0.4 MG CAPS capsule Take 1 capsule (0.4 mg total) by mouth daily. 05/04/13   Kary Kos, MD  traMADol (ULTRAM) 50 MG tablet Take 50 mg by mouth daily as needed.     Historical Provider, MD   There were no vitals taken for this visit. Physical Exam  Constitutional: He is oriented to person, place, and time. He appears well-developed and well-nourished.  HENT:  Head: Normocephalic and atraumatic.  Eyes: Pupils are equal, round, and reactive to light.  Neck: Normal range of motion. Neck supple. No JVD present. No tracheal deviation present. No thyromegaly present.  Cardiovascular: Normal rate, regular rhythm, normal heart sounds and intact distal pulses.  Exam reveals no gallop and no  friction rub.   No murmur heard. Pulmonary/Chest: Effort normal and breath sounds normal. No stridor. Tachypnea noted. No respiratory distress. He has no wheezes. He has no rales. He exhibits no tenderness.  Abdominal: Soft. Bowel sounds are normal. He exhibits no distension and no mass. There is no tenderness. There is no rebound and no guarding.  Musculoskeletal: Normal range of motion.  Lymphadenopathy:    He has no cervical adenopathy.  Neurological: He is alert and oriented to person, place, and time. Coordination normal.  Skin: Skin is warm and dry.  Psychiatric: He has a normal mood and affect. Judgment and thought content normal.  Nursing note and vitals reviewed.   ED Course  Procedures (including critical care time) Labs Review Labs Reviewed - No data to display  Imaging Review No results found.   EKG Interpretation   Date/Time:  Sunday October 07 2014 12:37:53 EDT Ventricular Rate:  67 PR Interval:  167 QRS Duration: 152 QT Interval:  460 QTC Calculation: 486 R Axis:   -28 Text Interpretation:  Sinus rhythm Left bundle branch block Sinus rhythm  Left bundle branch block No significant change since last tracing Abnormal  ekg Confirmed by Carmin Muskrat  MD [phone]) on 10/07/2014 12:56:09 PM      MDM   Final diagnoses:  Sepsis, due to unspecified organism    Labs: I-STAT lactic, urinalysis, troponin, CBC, blood culture, CMP  Imaging: DG chest- no acute cardiopulmonary abnormality  Consults: Medicine  Therapeutics: Vancomycin, ceftriaxone, judicious fluids,   Assessment/Plan: Patient's presentation likely sepsis. Sepsis protocol followed with appropriate antibiotic therapy. Medicine services consult for hospital admission, and agreed. Patient remained stable throughout his ED stay and reported he was feeling better with the addition of fluids.      Okey Regal, PA-C 10/07/14 1658  Carmin Muskrat, MD 10/07/14 1710

## 2014-10-07 NOTE — Progress Notes (Signed)
ANTIBIOTIC CONSULT NOTE - INITIAL  Pharmacy Consult for Cefepime and Vancomycin Indication: UTI  Allergies  Allergen Reactions  . Aspirin Other (See Comments)    ulcers  . Celebrex [Celecoxib] Other (See Comments)    Due to ulcer.  . Nsaids Other (See Comments)    Due to ulcer.  . Toprol Xl [Metoprolol Tartrate] Swelling  . Altace [Ramipril]   . Levothyroxine Other (See Comments)    "made him feel bad"  . Biaxin [Clarithromycin] Nausea Only  . Tetracyclines & Related Nausea Only    Patient Measurements: Height: 5\' 9"  (175.3 cm) Weight: 207 lb 3 oz (93.98 kg) IBW/kg (Calculated) : 70.7 Adjusted Body Weight:   Vital Signs: Temp: 100.9 F (38.3 C) (04/10 1533) Temp Source: Rectal (04/10 1533) BP: 125/56 mmHg (04/10 1533) Pulse Rate: 67 (04/10 1500) Intake/Output from previous day:   Intake/Output from this shift: Total I/O In: 2350 [I.V.:2350] Out: 675 [Urine:675]  Labs:  Recent Labs  10/05/14 0415 10/05/14 1040 10/07/14 1305  WBC 18.5*  --  11.8*  HGB 12.3*  --  13.1  PLT 100*  --  145*  CREATININE  --  1.21 1.41*   Estimated Creatinine Clearance: 49.6 mL/min (by C-G formula based on Cr of 1.41). No results for input(s): VANCOTROUGH, VANCOPEAK, VANCORANDOM, GENTTROUGH, GENTPEAK, GENTRANDOM, TOBRATROUGH, TOBRAPEAK, TOBRARND, AMIKACINPEAK, AMIKACINTROU, AMIKACIN in the last 72 hours.   Microbiology: Recent Results (from the past 720 hour(s))  Urine culture     Status: None   Collection Time: 09/30/14  9:13 AM  Result Value Ref Range Status   Specimen Description URINE, CATHETERIZED  Final   Special Requests NONE  Final   Colony Count NO GROWTH Performed at [facility]   Final   Culture NO GROWTH Performed at [facility]   Final   Report Status 10/01/2014 FINAL  Final  Urine culture     Status: None   Collection Time: 10/03/14  4:58 PM  Result Value Ref Range Status   Specimen Description URINE, RANDOM  Final   Special Requests  NONE  Final   Colony Count   Final    >=100,000 COLONIES/ML Performed at [facility]    Culture   Final    Multiple bacterial morphotypes present, none predominant. Suggest appropriate recollection if clinically indicated. Performed at [facility]    Report Status 10/04/2014 FINAL  Final  Blood culture (routine x 2)     Status: None (Preliminary result)   Collection Time: 10/03/14  6:58 PM  Result Value Ref Range Status   Specimen Description BLOOD LEFT HAND  Final   Special Requests BOTTLES DRAWN AEROBIC AND ANAEROBIC 5CC  Final   Culture   Final           BLOOD CULTURE RECEIVED NO GROWTH TO DATE CULTURE WILL BE HELD FOR 5 DAYS BEFORE ISSUING A FINAL NEGATIVE REPORT Performed at [facility]    Report Status PENDING  Incomplete  Blood culture (routine x 2)     Status: None (Preliminary result)   Collection Time: 10/03/14  7:56 PM  Result Value Ref Range Status   Specimen Description BLOOD HAND RIGHT  Final   Special Requests   Final    BOTTLES DRAWN AEROBIC AND ANAEROBIC 3CC PT ON ROCEPHIN   Culture   Final           BLOOD CULTURE RECEIVED NO GROWTH TO DATE CULTURE WILL BE HELD FOR 5 DAYS BEFORE ISSUING A FINAL NEGATIVE  REPORT Performed at [facility]    Report Status PENDING  Incomplete    Medical History: Past Medical History  Diagnosis Date  . LBBB (left bundle branch block)   . Sleep disturbance   . Insomnia   . Tremor   . HTN (hypertension)     orthostatic  . Dyslipidemia   . Thrombocytopenia   . Colon polyp   . Diverticulosis   . Renal insufficiency     renal duplex scan in 2012 showed no renal artery stenosis  . Coronary atherosclerosis of native coronary artery     a. s/p CABG 11/2003. b. s/p DES to radial-OM and SVG-RCA 11/05. c. s/p BMS to LCx 11/2008.  . Lumbar spondylolysis     multilevel DJD with mild spinal stenosis L3-L5  . Headache(784.0)   . Arthritis   . Thrombocytopenia   . Elevated fasting glucose   .  Gastritis   . Hiatal hernia   . Migraines   . H/O urinary retention     post surgical procedures  . Parkinson's disease   . Stroke     a. remote hx of stroke reportedly r/t HTN.  . Intracranial hemorrhage     a. small pontine [place] 2003.  [name] Peptic ulcer disease     a. prior hx of bleeding ulcers, including 12/2003 in setting of aspirin use.  . Gastritis     a. by EGD 04/2004.  [name] Hypertensive heart disease   . Dilated aortic root 05/29/2014    Medications:   (Not in a hospital admission) Scheduled:  . heparin  5,000 Units Subcutaneous 3 times per day  . sodium chloride  3 mL Intravenous Q12H   Infusions:  . sodium chloride     [name] presents with weakness and SOB. Pharmacy is consulted to dose vancomycin and cefepime for UTI. Pt is febrile to 100.9, WBC 11.8, sCr 1.4, with normal LA.  Goal of Therapy:  Vancomycin trough level 10-15 mcg/ml  Plan:  Vancomycin 1500mg  IV q24h Cefepime 2g IV q24h  Measure antibiotic drug levels at steady state Follow up culture results, renal function, and clinical course  Andrey Cota. Diona Foley, PharmD Clinical Pharmacist Pager [phone] 10/07/2014,4:02 PM

## 2014-10-07 NOTE — ED Notes (Signed)
Asked to hold Rocephin by Merry Proud, PA. Vancomycin to be ordered and wanted to start first.

## 2014-10-07 NOTE — ED Notes (Signed)
Vancomycin hasn't arrived from pharmacy. Will start Rocephin followed by vanc on its arrival. Nemacolin per [place], [place].

## 2014-10-07 NOTE — ED Notes (Signed)
Changed led bag to bedside drain bag.

## 2014-10-07 NOTE — Progress Notes (Signed)
Darryl [name] is a 80 y.o. male patient admitted from ED awake, alert - oriented  X 3 - no acute distress noted.  VSS - Blood pressure 169/76, pulse 66, temperature 99.7 F (37.6 C), temperature source Rectal, resp. rate 18, height 5\' 9"  (1.753 m), weight 95.7 kg (210 lb 15.7 oz), SpO2 98 %.    IV in place, occlusive dsg intact without redness.  Orientation to room, and floor completed with information packet given to patient/family.  Patient declined safety video at this time.  Admission INP armband ID verified with patient/family, and in place.   SR up x 2, fall assessment complete, with patient and family able to verbalize understanding of risk associated with falls, and verbalized understanding to call nsg before up out of bed.  Call light within reach, patient able to voice, and demonstrate understanding.  Skin tear to right forearm. Buttock red, blanchable.    Will cont to eval and treat per MD orders.  Janalyn Shy, RN 10/07/2014 4:52 PM

## 2014-10-07 NOTE — ED Notes (Signed)
Called pharmacy and requested vancomycin to be sent to Pod E ASAP.

## 2014-10-08 DIAGNOSIS — I10 Essential (primary) hypertension: Secondary | ICD-10-CM

## 2014-10-08 DIAGNOSIS — A419 Sepsis, unspecified organism: Principal | ICD-10-CM

## 2014-10-08 DIAGNOSIS — G20 Parkinson's disease: Secondary | ICD-10-CM

## 2014-10-08 LAB — CBC
HCT: 32.5 % — ABNORMAL LOW (ref 39.0–52.0)
HEMOGLOBIN: 11.8 g/dL — AB (ref 13.0–17.0)
MCH: 32.2 pg (ref 26.0–34.0)
MCHC: 36.3 g/dL — ABNORMAL HIGH (ref 30.0–36.0)
MCV: 88.6 fL (ref 78.0–100.0)
Platelets: 108 10*3/uL — ABNORMAL LOW (ref 150–400)
RBC: 3.67 MIL/uL — AB (ref 4.22–5.81)
RDW: 14.1 % (ref 11.5–15.5)
WBC: 12 10*3/uL — ABNORMAL HIGH (ref 4.0–10.5)

## 2014-10-08 LAB — BASIC METABOLIC PANEL
Anion gap: 12 (ref 5–15)
BUN: 10 mg/dL (ref 6–23)
CO2: 18 mmol/L — AB (ref 19–32)
CREATININE: 1.27 mg/dL (ref 0.50–1.35)
Calcium: 8.4 mg/dL (ref 8.4–10.5)
Chloride: 108 mmol/L (ref 96–112)
GFR calc Af Amer: 61 mL/min — ABNORMAL LOW (ref >90)
GFR calc non Af Amer: 53 mL/min — ABNORMAL LOW (ref >90)
GLUCOSE: 121 mg/dL — AB (ref 70–99)
Potassium: 3.7 mmol/L (ref 3.5–5.1)
Sodium: 138 mmol/L (ref 135–145)

## 2014-10-08 LAB — URINE CULTURE
Colony Count: NO GROWTH
Culture: NO GROWTH

## 2014-10-08 MED ORDER — SODIUM CHLORIDE 0.9 % IV SOLN
INTRAVENOUS | Status: DC
  Administered 2014-10-08: 1000 mL via INTRAVENOUS
  Administered 2014-10-08 – 2014-10-10 (×3): via INTRAVENOUS

## 2014-10-08 MED ORDER — ACETAMINOPHEN 325 MG PO TABS
650.0000 mg | ORAL_TABLET | ORAL | Status: DC | PRN
  Administered 2014-10-08 – 2014-10-09 (×4): 650 mg via ORAL
  Filled 2014-10-08 (×3): qty 2

## 2014-10-08 MED ORDER — SODIUM CHLORIDE 0.9 % IV BOLUS (SEPSIS)
500.0000 mL | Freq: Once | INTRAVENOUS | Status: AC
  Administered 2014-10-08: 500 mL via INTRAVENOUS

## 2014-10-08 MED ORDER — ZOLPIDEM TARTRATE 5 MG PO TABS: 5.0000 mg | ORAL_TABLET | Freq: Every evening | ORAL | Status: DC | PRN

## 2014-10-08 MED ORDER — ALBUTEROL SULFATE (2.5 MG/3ML) 0.083% IN NEBU
2.5000 mg | INHALATION_SOLUTION | Freq: Four times a day (QID) | RESPIRATORY_TRACT | Status: DC | PRN
  Administered 2014-10-09: 2.5 mg via RESPIRATORY_TRACT
  Filled 2014-10-08: qty 3

## 2014-10-08 NOTE — Progress Notes (Signed)
Utilization review completed.  

## 2014-10-08 NOTE — Progress Notes (Signed)
RN noticed patient urine becoming bloody (foley was flushed).  MD notified and recommended to hold Heparin until tomorrow. Will continue to monitor.

## 2014-10-08 NOTE — Progress Notes (Signed)
[facility] PROGRESS NOTE  Darryl [name] JQB:[id number] DOB: 1934/10/08 DOA: 10/07/2014 PCP: Tawanna Solo, MD  Assessment/Plan: 80 y/o male with PMH of HTN, CAD CABG, CKD, Parkinson's disease, recently admitted (4/6-4/8) for UTI presented with fever, chills. -admitted with UTI/fever   1. UTI/sirs/sepsis; hemodynamically stable;  Blood cultures: NGTD; urine cultures-pend  -will cont IV atx, IVF as needed;   2. CAD h/o CABG; no active chest pains; cont home regimen  3. CKD stage III, mild dehydration; will cont gentle IVF; recheck labs in AM  4. Parkinson's disease, cont home regimen  5. HTN; will hold ARB due to AKI; monitor  6. BPH with urinary retention, per patient Urology f/u on Thursday 7. Mild hyperbilirubinemia; h/o cholecystectomy; alk phos -102; ast -55, alt-32'; no vomiting, denies abdominal pains; recheck CMP in am;      Code Status: full Family Communication: d/w patient, his wife  (indicate person spoken with, relationship, and if by phone, the number) Disposition Plan: pend clinical improvement    Consultants:  none  Procedures:  none  Antibiotics:  vanc 4/10>>>  Zosyn 4/10>>>>   (indicate start date, and stop date if known)  HPI/Subjective: Alert  Objective: Filed Vitals:   10/08/14 0858  BP:   Pulse:   Temp: 98.7 F (37.1 C)  Resp:     Intake/Output Summary (Last 24 hours) at 10/08/14 0924 Last data filed at 10/07/14 2134  Gross per 24 hour  Intake   2410 ml  Output   1775 ml  Net    635 ml   Filed Weights   10/07/14 1238 10/07/14 1648  Weight: 93.98 kg (207 lb 3 oz) 95.7 kg (210 lb 15.7 oz)    Exam:   General:  Alert, oriented   Cardiovascular: s1,s2 rrr  Respiratory: CTA BL  Abdomen: soft, nt,nd   Musculoskeletal: no  Leg edema  Data Reviewed: Basic Metabolic Panel:  Recent Labs Lab 10/03/14 1650 10/04/14 0624 10/05/14 1040 10/07/14 1305  NA 136 136 136 135  K 3.9 3.9 3.4* 4.3  CL 103 109 109 103   CO2 $Re'27 19 23 23  'bTl$ GLUCOSE 145* 153* 216* 143*  BUN $Re'13 16 11 11  'WXP$ CREATININE 1.34 1.35 1.21 1.41*  CALCIUM 9.1 8.5 8.4 8.6   Liver Function Tests:  Recent Labs Lab 10/03/14 1650 10/07/14 1305  AST 27 55*  ALT 26 32  ALKPHOS 91 102  BILITOT 1.2 1.9*  PROT 6.8 6.2  ALBUMIN 3.6 2.8*    Recent Labs Lab 10/03/14 1650  LIPASE 33   No results for input(s): AMMONIA in the last 168 hours. CBC:  Recent Labs Lab 10/03/14 1650 10/04/14 0624 10/05/14 0415 10/07/14 1305 10/08/14 0759  WBC 21.2* 22.5* 18.5* 11.8* 12.0*  NEUTROABS 19.0*  --   --  10.2*  --   HGB 14.7 12.7* 12.3* 13.1 11.8*  HCT 39.8 35.8* 33.9* 35.5* 32.5*  MCV 88.4 89.5 87.8 87.2 88.6  PLT 121* 117* 100* 145* PENDING   Cardiac Enzymes: No results for input(s): CKTOTAL, CKMB, CKMBINDEX, TROPONINI in the last 168 hours. BNP (last 3 results) No results for input(s): BNP in the last 8760 hours.  ProBNP (last 3 results) No results for input(s): PROBNP in the last 8760 hours.  CBG: No results for input(s): GLUCAP in the last 168 hours.  Recent Results (from the past 240 hour(s))  Urine culture     Status: None   Collection Time: 09/30/14  9:13 AM  Result Value Ref Range Status  Specimen Description URINE, CATHETERIZED  Final   Special Requests NONE  Final   Colony Count NO GROWTH Performed at [facility]   Final   Culture NO GROWTH Performed at [facility]   Final   Report Status 10/01/2014 FINAL  Final  Urine culture     Status: None   Collection Time: 10/03/14  4:58 PM  Result Value Ref Range Status   Specimen Description URINE, RANDOM  Final   Special Requests NONE  Final   Colony Count   Final    >=100,000 COLONIES/ML Performed at [facility]    Culture   Final    Multiple bacterial morphotypes present, none predominant. Suggest appropriate recollection if clinically indicated. Performed at [facility]    Report Status 10/04/2014 FINAL  Final  Blood  culture (routine x 2)     Status: None (Preliminary result)   Collection Time: 10/03/14  6:58 PM  Result Value Ref Range Status   Specimen Description BLOOD LEFT HAND  Final   Special Requests BOTTLES DRAWN AEROBIC AND ANAEROBIC 5CC  Final   Culture   Final           BLOOD CULTURE RECEIVED NO GROWTH TO DATE CULTURE WILL BE HELD FOR 5 DAYS BEFORE ISSUING A FINAL NEGATIVE REPORT Performed at [facility]    Report Status PENDING  Incomplete  Blood culture (routine x 2)     Status: None (Preliminary result)   Collection Time: 10/03/14  7:56 PM  Result Value Ref Range Status   Specimen Description BLOOD HAND RIGHT  Final   Special Requests   Final    BOTTLES DRAWN AEROBIC AND ANAEROBIC 3CC PT ON ROCEPHIN   Culture   Final           BLOOD CULTURE RECEIVED NO GROWTH TO DATE CULTURE WILL BE HELD FOR 5 DAYS BEFORE ISSUING A FINAL NEGATIVE REPORT Performed at [facility]    Report Status PENDING  Incomplete  Blood Culture (routine x 2)     Status: None (Preliminary result)   Collection Time: 10/07/14  1:05 PM  Result Value Ref Range Status   Specimen Description BLOOD RIGHT ARM  Final   Special Requests BOTTLES DRAWN AEROBIC AND ANAEROBIC 3 CC  Final   Culture   Final           BLOOD CULTURE RECEIVED NO GROWTH TO DATE CULTURE WILL BE HELD FOR 5 DAYS BEFORE ISSUING A FINAL NEGATIVE REPORT Performed at [facility]    Report Status PENDING  Incomplete  Blood Culture (routine x 2)     Status: None (Preliminary result)   Collection Time: 10/07/14  1:30 PM  Result Value Ref Range Status   Specimen Description BLOOD LEFT HAND  Final   Special Requests BOTTLES DRAWN AEROBIC AND ANAEROBIC 5 CC  Final   Culture   Final           BLOOD CULTURE RECEIVED NO GROWTH TO DATE CULTURE WILL BE HELD FOR 5 DAYS BEFORE ISSUING A FINAL NEGATIVE REPORT Performed at [facility]    Report Status PENDING  Incomplete     Studies: Dg Chest Port 1 View  10/07/2014   CLINICAL  DATA:  Cough, shortness of breath.  EXAM: PORTABLE CHEST - 1 VIEW  COMPARISON:  None.  FINDINGS: The heart size and mediastinal contours are within normal limits. No pneumothorax or pleural effusion is noted. Status post coronary artery bypass graft. Both  lungs are clear. The visualized skeletal structures are unremarkable.  IMPRESSION: No acute cardiopulmonary abnormality seen.   Electronically Signed   By: Marijo Conception, M.D.   On: 10/07/2014 14:38    Scheduled Meds: . sodium chloride   Intravenous STAT  . antiseptic oral rinse  7 mL Mouth Rinse BID  . aspirin EC  81 mg Oral Q M,W,F  . ceFEPime (MAXIPIME) IV  2 g Intravenous Q24H  . citalopram  20 mg Oral Daily  . cloNIDine  0.2 mg Oral Daily   And  . cloNIDine  0.3 mg Oral QHS  . finasteride  5 mg Oral Daily  . heparin  5,000 Units Subcutaneous 3 times per day  . metoprolol  50 mg Oral BID  . pantoprazole  40 mg Oral q morning - 10a  . sodium chloride  3 mL Intravenous Q12H  . tamsulosin  0.4 mg Oral Daily  . vancomycin  1,500 mg Intravenous Q24H   Continuous Infusions:   Principal Problem:   UTI (urinary tract infection) Active Problems:   HTN (hypertension)   Dyslipidemia   LBBB (left bundle branch block)   Parkinson's disease   Coronary atherosclerosis of native coronary artery   CKD (chronic kidney disease), stage III   Sepsis   Urinary retention    Time spent: >35 minutes     Darryl [name]  [facility] Pager [phone]. If 7PM-7AM, please contact night-coverage at [email or web address], password [facility] 10/08/2014, 9:24 AM  LOS: 1 day

## 2014-10-08 NOTE — Progress Notes (Signed)
Approximately 0000 Pt complained of upper airway wheezing, respirations were 24, 100% on room air, BP was 173/89, Temp 103. Pt placed on 1L of therapeutic oxygen, given 650mg  tylenol and on-call clinician paged. On-call clinician advised to consult respiratory therapy on the use of racemic epinephrine and continue to monitor patient, no new orders were placed. Respiratory stated it was not needed.   Reassessment: BP 117/97,  Temp 98.3, Resp 18, 95% on 2L nasal cannula.

## 2014-10-09 ENCOUNTER — Inpatient Hospital Stay (HOSPITAL_COMMUNITY): Payer: Medicare Other

## 2014-10-09 DIAGNOSIS — R339 Retention of urine, unspecified: Secondary | ICD-10-CM

## 2014-10-09 LAB — COMPREHENSIVE METABOLIC PANEL
ALK PHOS: 91 U/L (ref 39–117)
ALT: 28 U/L (ref 0–53)
AST: 27 U/L (ref 0–37)
Albumin: 2.5 g/dL — ABNORMAL LOW (ref 3.5–5.2)
Anion gap: 5 (ref 5–15)
BILIRUBIN TOTAL: 1 mg/dL (ref 0.3–1.2)
BUN: 9 mg/dL (ref 6–23)
CO2: 27 mmol/L (ref 19–32)
Calcium: 8.4 mg/dL (ref 8.4–10.5)
Chloride: 105 mmol/L (ref 96–112)
Creatinine, Ser: 1.19 mg/dL (ref 0.50–1.35)
GFR, EST AFRICAN AMERICAN: 66 mL/min — AB (ref >90)
GFR, EST NON AFRICAN AMERICAN: 57 mL/min — AB (ref >90)
Glucose, Bld: 145 mg/dL — ABNORMAL HIGH (ref 70–99)
Potassium: 3.1 mmol/L — ABNORMAL LOW (ref 3.5–5.1)
SODIUM: 137 mmol/L (ref 135–145)
Total Protein: 5.7 g/dL — ABNORMAL LOW (ref 6.0–8.3)

## 2014-10-09 MED ORDER — TRAMADOL HCL 50 MG PO TABS
50.0000 mg | ORAL_TABLET | Freq: Four times a day (QID) | ORAL | Status: DC | PRN
  Administered 2014-10-09 – 2014-10-13 (×5): 50 mg via ORAL
  Filled 2014-10-09 (×5): qty 1

## 2014-10-09 MED ORDER — ACETAMINOPHEN 325 MG PO TABS
325.0000 mg | ORAL_TABLET | Freq: Once | ORAL | Status: AC
  Administered 2014-10-09: 325 mg via ORAL
  Filled 2014-10-09: qty 1

## 2014-10-09 MED ORDER — DEXTROSE 5 % IV SOLN
2.0000 g | Freq: Two times a day (BID) | INTRAVENOUS | Status: DC
  Administered 2014-10-09 – 2014-10-13 (×8): 2 g via INTRAVENOUS
  Filled 2014-10-09 (×9): qty 2

## 2014-10-09 MED ORDER — POTASSIUM CHLORIDE CRYS ER 20 MEQ PO TBCR
40.0000 meq | EXTENDED_RELEASE_TABLET | Freq: Two times a day (BID) | ORAL | Status: AC
  Administered 2014-10-09 (×2): 40 meq via ORAL
  Filled 2014-10-09 (×2): qty 2

## 2014-10-09 MED ORDER — CLONIDINE HCL 0.2 MG PO TABS
0.2000 mg | ORAL_TABLET | Freq: Three times a day (TID) | ORAL | Status: DC
  Administered 2014-10-09 – 2014-10-13 (×13): 0.2 mg via ORAL
  Filled 2014-10-09 (×16): qty 1

## 2014-10-09 MED ORDER — VANCOMYCIN HCL IN DEXTROSE 750-5 MG/150ML-% IV SOLN
750.0000 mg | Freq: Two times a day (BID) | INTRAVENOUS | Status: DC
  Administered 2014-10-10 – 2014-10-13 (×7): 750 mg via INTRAVENOUS
  Filled 2014-10-09 (×8): qty 150

## 2014-10-09 NOTE — Progress Notes (Signed)
Darryl [name] for Cefepime and Vancomycin Indication: UTI/possible urosepsis  Allergies  Allergen Reactions  . Aspirin Other (See Comments)    ulcers  . Celebrex [Celecoxib] Other (See Comments)    Due to ulcer.  . Nsaids Other (See Comments)    Due to ulcer.  . Toprol Xl [Metoprolol Tartrate] Swelling  . Altace [Ramipril]   . Levothyroxine Other (See Comments)    "made him feel bad"  . Biaxin [Clarithromycin] Nausea Only  . Tetracyclines & Related Nausea Only    Patient Measurements: Height: 5\' 9"  (175.3 cm) Weight: 210 lb 15.7 oz (95.7 kg) IBW/kg (Calculated) : 70.7   Vital Signs: Temp: 99 F (37.2 C) (04/12 1019) Temp Source: Oral (04/12 1019) BP: 170/92 mmHg (04/12 1019) Pulse Rate: 84 (04/12 1019) Intake/Output from previous day: 04/11 0701 - 04/12 0700 In: 840 [P.O.:840] Out: 1600 [Urine:1600] Intake/Output from this shift: Total I/O In: 482 [P.O.:482] Out: 450 [Urine:450]  Labs:  Recent Labs  10/07/14 1305 10/08/14 0759 10/09/14 0720  WBC 11.8* 12.0*  --   HGB 13.1 11.8*  --   PLT 145* 108*  --   CREATININE 1.41* 1.27 1.19   Estimated Creatinine Clearance: 59.3 mL/min (by C-G formula based on Cr of 1.19). No results for input(s): VANCOTROUGH, VANCOPEAK, VANCORANDOM, GENTTROUGH, GENTPEAK, GENTRANDOM, TOBRATROUGH, TOBRAPEAK, TOBRARND, AMIKACINPEAK, AMIKACINTROU, AMIKACIN in the last 72 hours.   Microbiology: Recent Results (from the past 720 hour(s))  Urine culture     Status: None   Collection Time: 09/30/14  9:13 AM  Result Value Ref Range Status   Specimen Description URINE, CATHETERIZED  Final   Special Requests NONE  Final   Colony Count NO GROWTH Performed at [facility]   Final   Culture NO GROWTH Performed at [facility]   Final   Report Status 10/01/2014 FINAL  Final  Urine culture     Status: None   Collection Time: 10/03/14  4:58 PM  Result Value Ref Range Status   Specimen  Description URINE, RANDOM  Final   Special Requests NONE  Final   Colony Count   Final    >=100,000 COLONIES/ML Performed at [facility]    Culture   Final    Multiple bacterial morphotypes present, none predominant. Suggest appropriate recollection if clinically indicated. Performed at [facility]    Report Status 10/04/2014 FINAL  Final  Blood culture (routine x 2)     Status: None (Preliminary result)   Collection Time: 10/03/14  6:58 PM  Result Value Ref Range Status   Specimen Description BLOOD LEFT HAND  Final   Special Requests BOTTLES DRAWN AEROBIC AND ANAEROBIC 5CC  Final   Culture   Final           BLOOD CULTURE RECEIVED NO GROWTH TO DATE CULTURE WILL BE HELD FOR 5 DAYS BEFORE ISSUING A FINAL NEGATIVE REPORT Performed at [facility]    Report Status PENDING  Incomplete  Blood culture (routine x 2)     Status: None (Preliminary result)   Collection Time: 10/03/14  7:56 PM  Result Value Ref Range Status   Specimen Description BLOOD HAND RIGHT  Final   Special Requests   Final    BOTTLES DRAWN AEROBIC AND ANAEROBIC 3CC PT ON ROCEPHIN   Culture   Final           BLOOD CULTURE RECEIVED NO GROWTH TO DATE CULTURE WILL BE HELD FOR 5 DAYS BEFORE ISSUING  A FINAL NEGATIVE REPORT Performed at [facility]    Report Status PENDING  Incomplete  Blood Culture (routine x 2)     Status: None (Preliminary result)   Collection Time: 10/07/14  1:05 PM  Result Value Ref Range Status   Specimen Description BLOOD RIGHT ARM  Final   Special Requests BOTTLES DRAWN AEROBIC AND ANAEROBIC 3 CC  Final   Culture   Final           BLOOD CULTURE RECEIVED NO GROWTH TO DATE CULTURE WILL BE HELD FOR 5 DAYS BEFORE ISSUING A FINAL NEGATIVE REPORT Performed at [facility]    Report Status PENDING  Incomplete  Blood Culture (routine x 2)     Status: None (Preliminary result)   Collection Time: 10/07/14  1:30 PM  Result Value Ref Range Status   Specimen  Description BLOOD LEFT HAND  Final   Special Requests BOTTLES DRAWN AEROBIC AND ANAEROBIC 5 CC  Final   Culture   Final           BLOOD CULTURE RECEIVED NO GROWTH TO DATE CULTURE WILL BE HELD FOR 5 DAYS BEFORE ISSUING A FINAL NEGATIVE REPORT Performed at [facility]    Report Status PENDING  Incomplete  Urine culture     Status: None   Collection Time: 10/07/14  1:30 PM  Result Value Ref Range Status   Specimen Description URINE, CATHETERIZED  Final   Special Requests NONE  Final   Colony Count NO GROWTH Performed at [facility]   Final   Culture NO GROWTH Performed at [facility]   Final   Report Status 10/08/2014 FINAL  Final   [name] presents with weakness, fever, and SOB.  Started on antibiotics for UTI. Some concern for urosepsis, which requires change in vancomycin trough level. Patient is still spiking fevers (Tmax/24h 102.7, WBC elevated at 12.  SCr has improved to 1.19 with est CrCl ~67mL/min.  Goal of Therapy:  Vancomycin trough level 15-20 mcg/ml with concern for urosepsis  Plan:  -change vancomycin to 750mg  IV q12h to obtain higher troughs- total daily dose is not changing, but interval now shorter. -Cefepime 2g IV q12h with improvement in renal function  -f/u c/s, fever curve, clinical progression, renal function, trough prn  Darryl [name] D. Darryl [name], PharmD, BCPS Clinical Pharmacist Pager: [phone] 10/09/2014 2:24 PM

## 2014-10-09 NOTE — Plan of Care (Signed)
Problem: Phase I Progression Outcomes Goal: OOB as tolerated unless otherwise ordered Outcome: Progressing Up with assist

## 2014-10-09 NOTE — Progress Notes (Signed)
[facility] PROGRESS NOTE  Darryl [name] [id number]:[id number] DOB: [birth year] DOA: 10/07/2014 PCP: Tawanna Solo, MD  Assessment/Plan: 80 y/o male with PMH of HTN, CAD CABG, CKD, Parkinson's disease, recently admitted (4/6-4/8) for UTI presented with fever, chills. -admitted with UTI/fever   1. UTI/sirs/sepsis; hemodynamically stable;  Blood cultures: NGTD; urine cultures: NGTD likely due to recent atx exposure   -intermittent fevers, will cont IV atx, IVF as needed; check renal US r/o loculated infection   2. CAD h/o CABG; no active chest pains; cont home regimen  3. CKD stage III, mild dehydration; will cont gentle IVF; recheck labs in AM  -hypokalemia, replace recheck in AM  4. Parkinson's disease, cont home regimen  5. HTN; will hold ARB due to AKI; increased clonidine to TID on 4/12; monitor  6. BPH with urinary retention, per patient Urology f/u on Thursday; cath was exchanged recently  7. Mild hyperbilirubinemia; h/o cholecystectomy; alk phos -102; ast -55, alt-32'; no vomiting, denies abdominal pains;    8. Mild thrombocytopenia, h/o thrombocytopenia, had mild hematuria-resolved; repeat CBC in AM   Code Status: full Family Communication: d/w patient, his wife  (indicate person spoken with, relationship, and if by phone, the number) Disposition Plan: pend clinical improvement    Consultants:  none  Procedures:  none  Antibiotics:  vanc 4/10>>>  Zosyn 4/10>>>>   (indicate start date, and stop date if known)  HPI/Subjective: Alert  Objective: Filed Vitals:   10/09/14 0651  BP: 177/87  Pulse: 81  Temp: 100.5 F (38.1 C)  Resp: 22    Intake/Output Summary (Last 24 hours) at 10/09/14 0926 Last data filed at 10/09/14 0908  Gross per 24 hour  Intake   1062 ml  Output   1600 ml  Net   -538 ml   Filed Weights   10/07/14 1238 10/07/14 1648  Weight: 93.98 kg (207 lb 3 oz) 95.7 kg (210 lb 15.7 oz)    Exam:   General:  Alert, oriented    Cardiovascular: s1,s2 rrr  Respiratory: CTA BL  Abdomen: soft, nt,nd   Musculoskeletal: no  Leg edema  Data Reviewed: Basic Metabolic Panel:  Recent Labs Lab 10/04/14 0624 10/05/14 1040 10/07/14 1305 10/08/14 0759 10/09/14 0720  NA 136 136 135 138 137  K 3.9 3.4* 4.3 3.7 3.1*  CL 109 109 103 108 105  CO2 $Re'19 23 23 'cQG$ 18* 27  GLUCOSE 153* 216* 143* 121* 145*  BUN $Re'16 11 11 10 9  'Gcs$ CREATININE 1.35 1.21 1.41* 1.27 1.19  CALCIUM 8.5 8.4 8.6 8.4 8.4   Liver Function Tests:  Recent Labs Lab 10/03/14 1650 10/07/14 1305 10/09/14 0720  AST 27 55* 27  ALT 26 32 28  ALKPHOS 91 102 91  BILITOT 1.2 1.9* 1.0  PROT 6.8 6.2 5.7*  ALBUMIN 3.6 2.8* 2.5*    Recent Labs Lab 10/03/14 1650  LIPASE 33   No results for input(s): AMMONIA in the last 168 hours. CBC:  Recent Labs Lab 10/03/14 1650 10/04/14 0624 10/05/14 0415 10/07/14 1305 10/08/14 0759  WBC 21.2* 22.5* 18.5* 11.8* 12.0*  NEUTROABS 19.0*  --   --  10.2*  --   HGB 14.7 12.7* 12.3* 13.1 11.8*  HCT 39.8 35.8* 33.9* 35.5* 32.5*  MCV 88.4 89.5 87.8 87.2 88.6  PLT 121* 117* 100* 145* 108*   Cardiac Enzymes: No results for input(s): CKTOTAL, CKMB, CKMBINDEX, TROPONINI in the last 168 hours. BNP (last 3 results) No results for input(s): BNP in the last 8760 hours.  ProBNP (last 3 results) No results for input(s): PROBNP in the last 8760 hours.  CBG: No results for input(s): GLUCAP in the last 168 hours.  Recent Results (from the past 240 hour(s))  Urine culture     Status: None   Collection Time: 09/30/14  9:13 AM  Result Value Ref Range Status   Specimen Description URINE, CATHETERIZED  Final   Special Requests NONE  Final   Colony Count NO GROWTH Performed at [facility]   Final   Culture NO GROWTH Performed at [facility]   Final   Report Status 10/01/2014 FINAL  Final  Urine culture     Status: None   Collection Time: 10/03/14  4:58 PM  Result Value Ref Range Status   Specimen  Description URINE, RANDOM  Final   Special Requests NONE  Final   Colony Count   Final    >=100,000 COLONIES/ML Performed at [facility]    Culture   Final    Multiple bacterial morphotypes present, none predominant. Suggest appropriate recollection if clinically indicated. Performed at [facility]    Report Status 10/04/2014 FINAL  Final  Blood culture (routine x 2)     Status: None (Preliminary result)   Collection Time: 10/03/14  6:58 PM  Result Value Ref Range Status   Specimen Description BLOOD LEFT HAND  Final   Special Requests BOTTLES DRAWN AEROBIC AND ANAEROBIC 5CC  Final   Culture   Final           BLOOD CULTURE RECEIVED NO GROWTH TO DATE CULTURE WILL BE HELD FOR 5 DAYS BEFORE ISSUING A FINAL NEGATIVE REPORT Performed at [facility]    Report Status PENDING  Incomplete  Blood culture (routine x 2)     Status: None (Preliminary result)   Collection Time: 10/03/14  7:56 PM  Result Value Ref Range Status   Specimen Description BLOOD HAND RIGHT  Final   Special Requests   Final    BOTTLES DRAWN AEROBIC AND ANAEROBIC 3CC PT ON ROCEPHIN   Culture   Final           BLOOD CULTURE RECEIVED NO GROWTH TO DATE CULTURE WILL BE HELD FOR 5 DAYS BEFORE ISSUING A FINAL NEGATIVE REPORT Performed at [facility]    Report Status PENDING  Incomplete  Blood Culture (routine x 2)     Status: None (Preliminary result)   Collection Time: 10/07/14  1:05 PM  Result Value Ref Range Status   Specimen Description BLOOD RIGHT ARM  Final   Special Requests BOTTLES DRAWN AEROBIC AND ANAEROBIC 3 CC  Final   Culture   Final           BLOOD CULTURE RECEIVED NO GROWTH TO DATE CULTURE WILL BE HELD FOR 5 DAYS BEFORE ISSUING A FINAL NEGATIVE REPORT Performed at [facility]    Report Status PENDING  Incomplete  Blood Culture (routine x 2)     Status: None (Preliminary result)   Collection Time: 10/07/14  1:30 PM  Result Value Ref Range Status   Specimen  Description BLOOD LEFT HAND  Final   Special Requests BOTTLES DRAWN AEROBIC AND ANAEROBIC 5 CC  Final   Culture   Final           BLOOD CULTURE RECEIVED NO GROWTH TO DATE CULTURE WILL BE HELD FOR 5 DAYS BEFORE ISSUING A FINAL NEGATIVE REPORT Performed at [facility]    Report Status PENDING  Incomplete  Urine culture     Status: None   Collection Time: 10/07/14  1:30 PM  Result Value Ref Range Status   Specimen Description URINE, CATHETERIZED  Final   Special Requests NONE  Final   Colony Count NO GROWTH Performed at [facility]   Final   Culture NO GROWTH Performed at [facility]   Final   Report Status 10/08/2014 FINAL  Final     Studies: Dg Chest Port 1 View  10/07/2014   CLINICAL DATA:  Cough, shortness of breath.  EXAM: PORTABLE CHEST - 1 VIEW  COMPARISON:  None.  FINDINGS: The heart size and mediastinal contours are within normal limits. No pneumothorax or pleural effusion is noted. Status post coronary artery bypass graft. Both lungs are clear. The visualized skeletal structures are unremarkable.  IMPRESSION: No acute cardiopulmonary abnormality seen.   Electronically Signed   By: Marijo Conception, M.D.   On: 10/07/2014 14:38    Scheduled Meds: . antiseptic oral rinse  7 mL Mouth Rinse BID  . aspirin EC  81 mg Oral Q M,W,F  . ceFEPime (MAXIPIME) IV  2 g Intravenous Q24H  . citalopram  20 mg Oral Daily  . cloNIDine  0.2 mg Oral Daily   And  . cloNIDine  0.3 mg Oral QHS  . finasteride  5 mg Oral Daily  . heparin  5,000 Units Subcutaneous 3 times per day  . metoprolol  50 mg Oral BID  . pantoprazole  40 mg Oral q morning - 10a  . sodium chloride  3 mL Intravenous Q12H  . tamsulosin  0.4 mg Oral Daily  . vancomycin  1,500 mg Intravenous Q24H   Continuous Infusions: . sodium chloride 50 mL/hr at 10/08/14 2236    Principal Problem:   UTI (urinary tract infection) Active Problems:   HTN (hypertension)   Dyslipidemia   LBBB (left bundle  branch block)   Parkinson's disease   Coronary atherosclerosis of native coronary artery   CKD (chronic kidney disease), stage III   Sepsis   Urinary retention    Time spent: >35 minutes     Kinnie Feil  [facility] Pager [phone]. If 7PM-7AM, please contact night-coverage at [email or web address], password [facility] 10/09/2014, 9:26 AM  LOS: 2 days

## 2014-10-09 NOTE — Clinical Documentation Improvement (Signed)
A cause and effect relationship may not be assumed and must be documented by a provider. Please clarify the relationship, if any, between Indwelling Urinary Catheter and Sepsis.  Are the conditions: ? Due to or associated with each other ? Other (please specify) ? Unrelated to each other ? Unable to determine ? Unknown    Risk Factors:Sepsis and Indwelling Foley Catheter  Sign & Symptoms:"Urinary tract infection - he has fever, leukocytosis, is mentating at baseline. He has a Foley catheter in, seems to not responded well to Ceftin, we'll send urine cultures, blood cultures, place patient on empiric Vancomycin and Cefepime "  Thank You, Alessandra Grout, RN, BSN, CCDS,Clinical Documentation Specialist:  [phone]  [phone]=[facility]- Health Information Management

## 2014-10-10 DIAGNOSIS — I5033 Acute on chronic diastolic (congestive) heart failure: Secondary | ICD-10-CM

## 2014-10-10 DIAGNOSIS — I5189 Other ill-defined heart diseases: Secondary | ICD-10-CM | POA: Diagnosis present

## 2014-10-10 LAB — BASIC METABOLIC PANEL
Anion gap: 5 (ref 5–15)
BUN: 10 mg/dL (ref 6–23)
CO2: 26 mmol/L (ref 19–32)
Calcium: 8.5 mg/dL (ref 8.4–10.5)
Chloride: 107 mmol/L (ref 96–112)
Creatinine, Ser: 1.14 mg/dL (ref 0.50–1.35)
GFR calc Af Amer: 70 mL/min — ABNORMAL LOW (ref >90)
GFR, EST NON AFRICAN AMERICAN: 60 mL/min — AB (ref >90)
Glucose, Bld: 128 mg/dL — ABNORMAL HIGH (ref 70–99)
POTASSIUM: 3.5 mmol/L (ref 3.5–5.1)
SODIUM: 138 mmol/L (ref 135–145)

## 2014-10-10 LAB — CULTURE, BLOOD (ROUTINE X 2)
CULTURE: NO GROWTH
Culture: NO GROWTH

## 2014-10-10 LAB — CBC
HCT: 32.3 % — ABNORMAL LOW (ref 39.0–52.0)
HEMOGLOBIN: 11.9 g/dL — AB (ref 13.0–17.0)
MCH: 31.6 pg (ref 26.0–34.0)
MCHC: 36.8 g/dL — ABNORMAL HIGH (ref 30.0–36.0)
MCV: 85.7 fL (ref 78.0–100.0)
PLATELETS: 129 10*3/uL — AB (ref 150–400)
RBC: 3.77 MIL/uL — ABNORMAL LOW (ref 4.22–5.81)
RDW: 13.8 % (ref 11.5–15.5)
WBC: 8.1 10*3/uL (ref 4.0–10.5)

## 2014-10-10 LAB — BRAIN NATRIURETIC PEPTIDE: B NATRIURETIC PEPTIDE 5: 878.5 pg/mL — AB (ref 0.0–100.0)

## 2014-10-10 MED ORDER — FUROSEMIDE 10 MG/ML IJ SOLN
20.0000 mg | Freq: Two times a day (BID) | INTRAMUSCULAR | Status: DC
  Administered 2014-10-10 – 2014-10-13 (×7): 20 mg via INTRAVENOUS
  Filled 2014-10-10 (×9): qty 2

## 2014-10-10 NOTE — Progress Notes (Signed)
PROGRESS NOTE  Darryl [name] OHY:[id number] DOB: 27-May-1935 DOA: 10/07/2014 PCP: Tawanna Solo, MD  HPI/Recap of past 24 hours: Patient is a 80 year old male with past history of Parkinson's disease, CAD who had been discharged from the hospitalist service a week prior with UTI and fever and came back in with sepsis secondary to UTI. At that time, urine cultures inconclusive.  Patient aggressively hydrated and currently on broad-spectrum IV antibiotics. Urine cultures predominant positive for gram-positive rods. Patient self doing okay, with no complaints. No chest pain or shortness of breath. Noted persistently elevated blood pressures and given history of heart failure, BNP checked and found to be elevated at almost 900.  Assessment/Plan::     Parkinson's disease: Continue home meds   Coronary atherosclerosis of native coronary artery: Stable    CKD (chronic kidney disease), stage 2: At baseline    Sepsis secondary to urinary tract infection: Blood cultures are positive. Continue broad-spectrum antibiotics   Urinary retention status post Foley catheter. Catheter not cause of sepsis as a cause more likely from antibiotic coverage    Acute on Chronic diastolic heart failure: Confirmed with elevated BNP. Have started IV Lasix.  Obesity: Patient meets criteria with BMI greater than 30   Code Status: Full code  Family Communication: Spoke with multiple family members today  Disposition Plan: Home once fully diuresed and final by mouth antibiotics chosen based off of sensitivities and cultures   Consultants:  None  Procedures:  None  Antibiotics:  IV vancomycin 4/10-present  IV Zosyn 4/10 present   Objective: BP 136/93 mmHg  Pulse 59  Temp(Src) 98.9 F (37.2 C) (Oral)  Resp 20  Ht 5\' 9"  (1.753 m)  Wt 95.7 kg (210 lb 15.7 oz)  BMI 31.14 kg/m2  SpO2 95%  Intake/Output Summary (Last 24 hours) at 10/10/14 1740 Last data filed at 10/10/14 1359  Gross per 24  hour  Intake 2893.33 ml  Output   2950 ml  Net -56.67 ml   Filed Weights   10/07/14 1238 10/07/14 1648  Weight: 93.98 kg (207 lb 3 oz) 95.7 kg (210 lb 15.7 oz)    Exam:   General:  Alert and oriented 3, no acute distress  Cardiovascular: Regular rate and rhythm, R4-W5, 2/6 systolic ejection murmur  Respiratory: Decreased breath sounds bibasilar  Abdomen: Soft, nontender, hypoactive bowel sounds  Musculoskeletal: Trace pitting edema bilaterally   Data Reviewed: Basic Metabolic Panel:  Recent Labs Lab 10/05/14 1040 10/07/14 1305 10/08/14 0759 10/09/14 0720 10/10/14 0606  NA 136 135 138 137 138  K 3.4* 4.3 3.7 3.1* 3.5  CL 109 103 108 105 107  CO2 23 23 18* 27 26  GLUCOSE 216* 143* 121* 145* 128*  BUN 11 11 10 9 10   CREATININE 1.21 1.41* 1.27 1.19 1.14  CALCIUM 8.4 8.6 8.4 8.4 8.5   Liver Function Tests:  Recent Labs Lab 10/07/14 1305 10/09/14 0720  AST 55* 27  ALT 32 28  ALKPHOS 102 91  BILITOT 1.9* 1.0  PROT 6.2 5.7*  ALBUMIN 2.8* 2.5*   No results for input(s): LIPASE, AMYLASE in the last 168 hours. No results for input(s): AMMONIA in the last 168 hours. CBC:  Recent Labs Lab 10/04/14 0624 10/05/14 0415 10/07/14 1305 10/08/14 0759 10/10/14 0606  WBC 22.5* 18.5* 11.8* 12.0* 8.1  NEUTROABS  --   --  10.2*  --   --   HGB 12.7* 12.3* 13.1 11.8* 11.9*  HCT 35.8* 33.9* 35.5* 32.5* 32.3*  MCV 89.5  87.8 87.2 88.6 85.7  PLT 117* 100* 145* 108* 129*   Cardiac Enzymes:   No results for input(s): CKTOTAL, CKMB, CKMBINDEX, TROPONINI in the last 168 hours. BNP (last 3 results)  Recent Labs  10/10/14 1302  BNP 878.5*    ProBNP (last 3 results) No results for input(s): PROBNP in the last 8760 hours.  CBG: No results for input(s): GLUCAP in the last 168 hours.  Recent Results (from the past 240 hour(s))  Urine culture     Status: None   Collection Time: 10/03/14  4:58 PM  Result Value Ref Range Status   Specimen Description URINE, RANDOM   Final   Special Requests NONE  Final   Colony Count   Final    >=100,000 COLONIES/ML Performed at [facility]    Culture   Final    Multiple bacterial morphotypes present, none predominant. Suggest appropriate recollection if clinically indicated. Performed at [facility]    Report Status 10/04/2014 FINAL  Final  Blood culture (routine x 2)     Status: None   Collection Time: 10/03/14  6:58 PM  Result Value Ref Range Status   Specimen Description BLOOD LEFT HAND  Final   Special Requests BOTTLES DRAWN AEROBIC AND ANAEROBIC 5CC  Final   Culture   Final    NO GROWTH 5 DAYS Performed at [facility]    Report Status 10/10/2014 FINAL  Final  Blood culture (routine x 2)     Status: None   Collection Time: 10/03/14  7:56 PM  Result Value Ref Range Status   Specimen Description BLOOD HAND RIGHT  Final   Special Requests   Final    BOTTLES DRAWN AEROBIC AND ANAEROBIC 3CC PT ON ROCEPHIN   Culture   Final    NO GROWTH 5 DAYS Performed at [facility]    Report Status 10/10/2014 FINAL  Final  Blood Culture (routine x 2)     Status: None (Preliminary result)   Collection Time: 10/07/14  1:05 PM  Result Value Ref Range Status   Specimen Description BLOOD RIGHT ARM  Final   Special Requests BOTTLES DRAWN AEROBIC AND ANAEROBIC 3 CC  Final   Culture   Final    GRAM POSITIVE RODS Note: Gram Stain Report Called to,Read Back By and Verified With: Joslyn Hy BY INGRAM A 10/10/14 2P Performed at [facility]    Report Status PENDING  Incomplete  Blood Culture (routine x 2)     Status: None (Preliminary result)   Collection Time: 10/07/14  1:30 PM  Result Value Ref Range Status   Specimen Description BLOOD LEFT HAND  Final   Special Requests BOTTLES DRAWN AEROBIC AND ANAEROBIC 5 CC  Final   Culture   Final           BLOOD CULTURE RECEIVED NO GROWTH TO DATE CULTURE WILL BE HELD FOR 5 DAYS BEFORE ISSUING A FINAL NEGATIVE REPORT Performed at  [facility]    Report Status PENDING  Incomplete  Urine culture     Status: None   Collection Time: 10/07/14  1:30 PM  Result Value Ref Range Status   Specimen Description URINE, CATHETERIZED  Final   Special Requests NONE  Final   Colony Count NO GROWTH Performed at [facility]   Final   Culture NO GROWTH Performed at [facility]   Final   Report Status 10/08/2014 FINAL  Final     Studies: US Renal  10/09/2014   CLINICAL DATA:  Fever  EXAM: RENAL/URINARY TRACT ULTRASOUND COMPLETE  COMPARISON:  CT 09/08/2010  FINDINGS: Right Kidney:  Length: 12.3 cm. Diffuse cortical thinning with increased echotexture. No hydronephrosis or focal mass.  Left Kidney:  Length: 12.9 cm. Diffuse cortical thinning with increased echotexture. No hydronephrosis or focal mass.  Bladder:  Foley catheter in place, decompressed.  IMPRESSION: Cortical thinning with increased renal echotexture compatible with chronic medical renal disease. No hydronephrosis.   Electronically Signed   By: Rolm Baptise M.D.   On: 10/09/2014 18:11    Scheduled Meds: . antiseptic oral rinse  7 mL Mouth Rinse BID  . aspirin EC  81 mg Oral Q M,W,F  . ceFEPime (MAXIPIME) IV  2 g Intravenous Q12H  . citalopram  20 mg Oral Daily  . cloNIDine  0.2 mg Oral TID  . finasteride  5 mg Oral Daily  . furosemide  20 mg Intravenous Q12H  . heparin  5,000 Units Subcutaneous 3 times per day  . metoprolol  50 mg Oral BID  . pantoprazole  40 mg Oral q morning - 10a  . sodium chloride  3 mL Intravenous Q12H  . tamsulosin  0.4 mg Oral Daily  . vancomycin  750 mg Intravenous Q12H    Continuous Infusions:    Time spent: 25 minutes  [place] [facility] Pager [phone]. If 7PM-7AM, please contact night-coverage at [email or web address], password [facility] 10/10/2014, 5:40 PM  LOS: 3 days

## 2014-10-10 NOTE — Progress Notes (Signed)
CRITICAL VALUE ALERT  Critical value received:  Gram positive rods  Date of notification:  10/10/14  Time of notification:  1400  Critical value read back:Yes.    Nurse who received alert:  Joslyn Hy  MD notified (1st page):  [place] Pink  Time of first page:  1405  MD notified (2nd page):  Time of second page:  Responding MD:  [place] Pink  Time MD responded:  MD was standing in hall when RN took value.

## 2014-10-10 NOTE — Care Management Note (Addendum)
    Page 1 of 1   10/12/2014     10:37:22 AM CARE MANAGEMENT NOTE 10/12/2014  Patient:  RASHID, WHITENIGHT   Account Number:  [id number]  Date Initiated:  10/10/2014  Documentation initiated by:  Carles Collet  Subjective/Objective Assessment:   UTI, IV antibiotics, volume overload, continue diuresing     Action/Plan:   will continue to follow for any dc needs   Anticipated DC Date:  10/10/2014   Anticipated DC Plan:  HOME/SELF CARE  In-house referral  Clinical Social Worker      DC Planning Services  CM consult      Choice offered to / List presented to:             Status of service:  Completed, signed off Medicare Important Message given?  YES (If response is "NO", the following Medicare IM given date fields will be blank) Date Medicare IM given:  10/10/2014 Medicare IM given by:  Carles Collet Date Additional Medicare IM given:  10/12/2014 Additional Medicare IM given by:  [facility] SWIST  Discharge Disposition:    Per UR Regulation:    If discussed at Long Length of Stay Meetings, dates discussed:    Comments:  10-12-14 IM letter given will follow for any dc needs. Carles Collet RN BSN CM 10-10-14 IM Letter given to patient will follow for any dc needs. Carles Collet RN BSN CM

## 2014-10-11 DIAGNOSIS — N390 Urinary tract infection, site not specified: Secondary | ICD-10-CM

## 2014-10-11 DIAGNOSIS — I5032 Chronic diastolic (congestive) heart failure: Secondary | ICD-10-CM

## 2014-10-11 DIAGNOSIS — R319 Hematuria, unspecified: Secondary | ICD-10-CM

## 2014-10-11 LAB — URINALYSIS, ROUTINE W REFLEX MICROSCOPIC
GLUCOSE, UA: NEGATIVE mg/dL
Ketones, ur: 15 mg/dL — AB
Nitrite: POSITIVE — AB
PROTEIN: 100 mg/dL — AB
Specific Gravity, Urine: 1.026 (ref 1.005–1.030)
Urobilinogen, UA: 1 mg/dL (ref 0.0–1.0)
pH: 5.5 (ref 5.0–8.0)

## 2014-10-11 LAB — BASIC METABOLIC PANEL
Anion gap: 10 (ref 5–15)
BUN: 13 mg/dL (ref 6–23)
CALCIUM: 8.9 mg/dL (ref 8.4–10.5)
CO2: 25 mmol/L (ref 19–32)
Chloride: 101 mmol/L (ref 96–112)
Creatinine, Ser: 1.19 mg/dL (ref 0.50–1.35)
GFR calc Af Amer: 66 mL/min — ABNORMAL LOW (ref >90)
GFR, EST NON AFRICAN AMERICAN: 57 mL/min — AB (ref >90)
GLUCOSE: 180 mg/dL — AB (ref 70–99)
Potassium: 3.2 mmol/L — ABNORMAL LOW (ref 3.5–5.1)
SODIUM: 136 mmol/L (ref 135–145)

## 2014-10-11 LAB — CULTURE, BLOOD (ROUTINE X 2)

## 2014-10-11 LAB — URINE MICROSCOPIC-ADD ON

## 2014-10-11 MED ORDER — LIVING BETTER WITH HEART FAILURE BOOK
Freq: Once | Status: AC
  Administered 2014-10-11: 16:00:00
  Filled 2014-10-11: qty 1

## 2014-10-11 MED ORDER — HYDRALAZINE HCL 10 MG PO TABS
10.0000 mg | ORAL_TABLET | Freq: Three times a day (TID) | ORAL | Status: DC
  Administered 2014-10-11 – 2014-10-13 (×7): 10 mg via ORAL
  Filled 2014-10-11 (×9): qty 1

## 2014-10-11 NOTE — Progress Notes (Signed)
PROGRESS NOTE  ELIGE SHOUSE WPY:[id number] DOB: [birth year] DOA: 10/07/2014 PCP: Tawanna Solo, MD  HPI/Recap of past 24 hours: Patient is a 80 year old male with past history of Parkinson's disease, CAD who had been discharged from the hospitalist service a week prior with UTI and fever and came back in with sepsis secondary to UTI. At that time, urine cultures inconclusive.  Patient aggressively hydrated and currently on broad-spectrum IV antibiotics. Urine cultures predominant positive for gram-positive rods. Patient self doing okay, with no complaints. No chest pain or shortness of breath. Noted persistently elevated blood pressures and given history of heart failure, BNP checked and found to be elevated at almost 900.  Patient today doing well. Has diuresed about 4 L since Lasix started. Feels okay. Started having hematuria this morning.  Assessment/Plan::  Hematuria: Confirmed with repeat urinalysis. Have stopped subcutaneous heparin and started SCDs   Parkinson's disease: Continue home meds   Coronary atherosclerosis of native coronary artery: Stable    CKD (chronic kidney disease), stage 2: At baseline    Sepsis secondary to urinary tract infection: Blood cultures positive for diphtheroids, suspect contaminant. Continue broad-spectrum antibiotics   Urinary retention status post Foley catheter. Catheter not cause of sepsis as a cause more likely from not for antibiotic coverage.    Acute on Chronic diastolic heart failure: Confirmed with elevated BNP. Have started IV Lasix.  Obesity: Patient meets criteria with BMI greater than 30   Code Status: Full code  Family Communication: Spoke with wife today  Disposition Plan: Home once fully diuresed    Consultants:  None  Procedures:  None  Antibiotics:  IV vancomycin 4/10-present  IV Zosyn 4/10 present   Objective: BP 173/76 mmHg  Pulse 60  Temp(Src) 98.1 F (36.7 C) (Oral)  Resp 18  Ht 5\' 9"  (1.753 m)   Wt 95.5 kg (210 lb 8.6 oz)  BMI 31.08 kg/m2  SpO2 97%  Intake/Output Summary (Last 24 hours) at 10/11/14 1809 Last data filed at 10/11/14 1750  Gross per 24 hour  Intake    840 ml  Output   5050 ml  Net  -4210 ml   Filed Weights   10/07/14 1238 10/07/14 1648 10/11/14 0432  Weight: 93.98 kg (207 lb 3 oz) 95.7 kg (210 lb 15.7 oz) 95.5 kg (210 lb 8.6 oz)    Exam:   General:  Alert and oriented 3, no acute distress  Cardiovascular: Regular rate and rhythm, K5-L9, 2/6 systolic ejection murmur  Respiratory: Decreased breath sounds bibasilar  Abdomen: Soft, nontender, hypoactive bowel sounds  Musculoskeletal: Trace pitting edema bilaterally   Data Reviewed: Basic Metabolic Panel:  Recent Labs Lab 10/07/14 1305 10/08/14 0759 10/09/14 0720 10/10/14 0606 10/11/14 1405  NA 135 138 137 138 136  K 4.3 3.7 3.1* 3.5 3.2*  CL 103 108 105 107 101  CO2 23 18* 27 26 25   GLUCOSE 143* 121* 145* 128* 180*  BUN 11 10 9 10 13   CREATININE 1.41* 1.27 1.19 1.14 1.19  CALCIUM 8.6 8.4 8.4 8.5 8.9   Liver Function Tests:  Recent Labs Lab 10/07/14 1305 10/09/14 0720  AST 55* 27  ALT 32 28  ALKPHOS 102 91  BILITOT 1.9* 1.0  PROT 6.2 5.7*  ALBUMIN 2.8* 2.5*   No results for input(s): LIPASE, AMYLASE in the last 168 hours. No results for input(s): AMMONIA in the last 168 hours. CBC:  Recent Labs Lab 10/05/14 0415 10/07/14 1305 10/08/14 0759 10/10/14 0606  WBC 18.5* 11.8* 12.0* 8.1  NEUTROABS  --  10.2*  --   --   HGB 12.3* 13.1 11.8* 11.9*  HCT 33.9* 35.5* 32.5* 32.3*  MCV 87.8 87.2 88.6 85.7  PLT 100* 145* 108* 129*   Cardiac Enzymes:   No results for input(s): CKTOTAL, CKMB, CKMBINDEX, TROPONINI in the last 168 hours. BNP (last 3 results)  Recent Labs  10/10/14 1302  BNP 878.5*    ProBNP (last 3 results) No results for input(s): PROBNP in the last 8760 hours.  CBG: No results for input(s): GLUCAP in the last 168 hours.  Recent Results (from the past  240 hour(s))  Urine culture     Status: None   Collection Time: 10/03/14  4:58 PM  Result Value Ref Range Status   Specimen Description URINE, RANDOM  Final   Special Requests NONE  Final   Colony Count   Final    >=100,000 COLONIES/ML Performed at [facility]    Culture   Final    Multiple bacterial morphotypes present, none predominant. Suggest appropriate recollection if clinically indicated. Performed at [facility]    Report Status 10/04/2014 FINAL  Final  Blood culture (routine x 2)     Status: None   Collection Time: 10/03/14  6:58 PM  Result Value Ref Range Status   Specimen Description BLOOD LEFT HAND  Final   Special Requests BOTTLES DRAWN AEROBIC AND ANAEROBIC 5CC  Final   Culture   Final    NO GROWTH 5 DAYS Performed at [facility]    Report Status 10/10/2014 FINAL  Final  Blood culture (routine x 2)     Status: None   Collection Time: 10/03/14  7:56 PM  Result Value Ref Range Status   Specimen Description BLOOD HAND RIGHT  Final   Special Requests   Final    BOTTLES DRAWN AEROBIC AND ANAEROBIC 3CC PT ON ROCEPHIN   Culture   Final    NO GROWTH 5 DAYS Performed at [facility]    Report Status 10/10/2014 FINAL  Final  Blood Culture (routine x 2)     Status: None   Collection Time: 10/07/14  1:05 PM  Result Value Ref Range Status   Specimen Description BLOOD RIGHT ARM  Final   Special Requests BOTTLES DRAWN AEROBIC AND ANAEROBIC 3 CC  Final   Culture   Final    DIPHTHEROIDS(CORYNEBACTERIUM SPECIES) Note: Standardized susceptibility testing for this organism is not available. Note: Gram Stain Report Called to,Read Back By and Verified With: Joslyn Hy BY INGRAM A 10/10/14 2P Performed at [facility]    Report Status 10/11/2014 FINAL  Final  Blood Culture (routine x 2)     Status: None (Preliminary result)   Collection Time: 10/07/14  1:30 PM  Result Value Ref Range Status   Specimen Description BLOOD LEFT HAND   Final   Special Requests BOTTLES DRAWN AEROBIC AND ANAEROBIC 5 CC  Final   Culture   Final           BLOOD CULTURE RECEIVED NO GROWTH TO DATE CULTURE WILL BE HELD FOR 5 DAYS BEFORE ISSUING A FINAL NEGATIVE REPORT Performed at [facility]    Report Status PENDING  Incomplete  Urine culture     Status: None   Collection Time: 10/07/14  1:30 PM  Result Value Ref Range Status   Specimen Description URINE, CATHETERIZED  Final   Special Requests NONE  Final   Colony Count NO GROWTH Performed at [facility]   Final   Culture NO GROWTH Performed at [facility]   Final   Report Status 10/08/2014 FINAL  Final     Studies: No results found.  Scheduled Meds: . antiseptic oral rinse  7 mL Mouth Rinse BID  . aspirin EC  81 mg Oral Q M,W,F  . ceFEPime (MAXIPIME) IV  2 g Intravenous Q12H  . citalopram  20 mg Oral Daily  . cloNIDine  0.2 mg Oral TID  . finasteride  5 mg Oral Daily  . furosemide  20 mg Intravenous Q12H  . hydrALAZINE  10 mg Oral 3 times per day  . metoprolol  50 mg Oral BID  . pantoprazole  40 mg Oral q morning - 10a  . sodium chloride  3 mL Intravenous Q12H  . tamsulosin  0.4 mg Oral Daily  . vancomycin  750 mg Intravenous Q12H    Continuous Infusions:    Time spent: 25 minutes  [place] [facility] Pager [phone]. If 7PM-7AM, please contact night-coverage at [email or web address], password [facility] 10/11/2014, 6:09 PM  LOS: 4 days

## 2014-10-12 LAB — BASIC METABOLIC PANEL
Anion gap: 10 (ref 5–15)
BUN: 11 mg/dL (ref 6–23)
CO2: 28 mmol/L (ref 19–32)
Calcium: 9.1 mg/dL (ref 8.4–10.5)
Chloride: 98 mmol/L (ref 96–112)
Creatinine, Ser: 1.27 mg/dL (ref 0.50–1.35)
GFR calc non Af Amer: 53 mL/min — ABNORMAL LOW (ref >90)
GFR, EST AFRICAN AMERICAN: 61 mL/min — AB (ref >90)
Glucose, Bld: 142 mg/dL — ABNORMAL HIGH (ref 70–99)
Potassium: 3.4 mmol/L — ABNORMAL LOW (ref 3.5–5.1)
Sodium: 136 mmol/L (ref 135–145)

## 2014-10-12 LAB — CBC
HCT: 34.8 % — ABNORMAL LOW (ref 39.0–52.0)
HEMOGLOBIN: 12.7 g/dL — AB (ref 13.0–17.0)
MCH: 31.6 pg (ref 26.0–34.0)
MCHC: 36.5 g/dL — AB (ref 30.0–36.0)
MCV: 86.6 fL (ref 78.0–100.0)
Platelets: 185 10*3/uL (ref 150–400)
RBC: 4.02 MIL/uL — ABNORMAL LOW (ref 4.22–5.81)
RDW: 13.6 % (ref 11.5–15.5)
WBC: 6.5 10*3/uL (ref 4.0–10.5)

## 2014-10-12 MED ORDER — TEMAZEPAM 7.5 MG PO CAPS
7.5000 mg | ORAL_CAPSULE | Freq: Every day | ORAL | Status: DC
  Administered 2014-10-12: 7.5 mg via ORAL
  Filled 2014-10-12: qty 1

## 2014-10-12 NOTE — Progress Notes (Signed)
ANTIBIOTIC CONSULT NOTE - FOLLOW UP  Pharmacy Consult for Vanco/Cefepime Indication: Urosepsis  Allergies  Allergen Reactions  . Aspirin Other (See Comments)    ulcers  . Celebrex [Celecoxib] Other (See Comments)    Due to ulcer.  . Nsaids Other (See Comments)    Due to ulcer.  . Toprol Xl [Metoprolol Tartrate] Swelling  . Altace [Ramipril]   . Levothyroxine Other (See Comments)    "made him feel bad"  . Biaxin [Clarithromycin] Nausea Only  . Tetracyclines & Related Nausea Only    Patient Measurements: Height: 5\' 9"  (175.3 cm) Weight: 202 lb 3.2 oz (91.717 kg) IBW/kg (Calculated) : 70.7 Adjusted Body Weight:    Vital Signs: Temp: 98.5 F (36.9 C) (04/15 0503) Temp Source: Oral (04/15 0503) BP: 138/89 mmHg (04/15 0937) Pulse Rate: 64 (04/15 0503) Intake/Output from previous day: 04/14 0701 - 04/15 0700 In: 640 [P.O.:640] Out: 3550 [Urine:3550] Intake/Output from this shift: Total I/O In: 200 [P.O.:200] Out: -   Labs:  Recent Labs  10/10/14 0606 10/11/14 1405 10/12/14 0516  WBC 8.1  --  6.5  HGB 11.9*  --  12.7*  PLT 129*  --  185  CREATININE 1.14 1.19 1.27   Estimated Creatinine Clearance: 54.5 mL/min (by C-G formula based on Cr of 1.27). No results for input(s): VANCOTROUGH, VANCOPEAK, VANCORANDOM, GENTTROUGH, GENTPEAK, GENTRANDOM, TOBRATROUGH, TOBRAPEAK, TOBRARND, AMIKACINPEAK, AMIKACINTROU, AMIKACIN in the last 72 hours.   Microbiology: Recent Results (from the past 720 hour(s))  Urine culture     Status: None   Collection Time: 09/30/14  9:13 AM  Result Value Ref Range Status   Specimen Description URINE, CATHETERIZED  Final   Special Requests NONE  Final   Colony Count NO GROWTH Performed at [facility]   Final   Culture NO GROWTH Performed at [facility]   Final   Report Status 10/01/2014 FINAL  Final  Urine culture     Status: None   Collection Time: 10/03/14  4:58 PM  Result Value Ref Range Status   Specimen  Description URINE, RANDOM  Final   Special Requests NONE  Final   Colony Count   Final    >=100,000 COLONIES/ML Performed at [facility]    Culture   Final    Multiple bacterial morphotypes present, none predominant. Suggest appropriate recollection if clinically indicated. Performed at [facility]    Report Status 10/04/2014 FINAL  Final  Blood culture (routine x 2)     Status: None   Collection Time: 10/03/14  6:58 PM  Result Value Ref Range Status   Specimen Description BLOOD LEFT HAND  Final   Special Requests BOTTLES DRAWN AEROBIC AND ANAEROBIC 5CC  Final   Culture   Final    NO GROWTH 5 DAYS Performed at [facility]    Report Status 10/10/2014 FINAL  Final  Blood culture (routine x 2)     Status: None   Collection Time: 10/03/14  7:56 PM  Result Value Ref Range Status   Specimen Description BLOOD HAND RIGHT  Final   Special Requests   Final    BOTTLES DRAWN AEROBIC AND ANAEROBIC 3CC PT ON ROCEPHIN   Culture   Final    NO GROWTH 5 DAYS Performed at [facility]    Report Status 10/10/2014 FINAL  Final  Blood Culture (routine x 2)     Status: None   Collection Time: 10/07/14  1:05 PM  Result Value Ref Range Status  Specimen Description BLOOD RIGHT ARM  Final   Special Requests BOTTLES DRAWN AEROBIC AND ANAEROBIC 3 CC  Final   Culture   Final    DIPHTHEROIDS(CORYNEBACTERIUM SPECIES) Note: Standardized susceptibility testing for this organism is not available. Note: Gram Stain Report Called to,Read Back By and Verified With: Joslyn Hy BY INGRAM A 10/10/14 2P Performed at [facility]    Report Status 10/11/2014 FINAL  Final  Blood Culture (routine x 2)     Status: None (Preliminary result)   Collection Time: 10/07/14  1:30 PM  Result Value Ref Range Status   Specimen Description BLOOD LEFT HAND  Final   Special Requests BOTTLES DRAWN AEROBIC AND ANAEROBIC 5 CC  Final   Culture   Final           BLOOD CULTURE RECEIVED  NO GROWTH TO DATE CULTURE WILL BE HELD FOR 5 DAYS BEFORE ISSUING A FINAL NEGATIVE REPORT Performed at [facility]    Report Status PENDING  Incomplete  Urine culture     Status: None   Collection Time: 10/07/14  1:30 PM  Result Value Ref Range Status   Specimen Description URINE, CATHETERIZED  Final   Special Requests NONE  Final   Colony Count NO GROWTH Performed at [facility]   Final   Culture NO GROWTH Performed at [facility]   Final   Report Status 10/08/2014 FINAL  Final    Anti-infectives    Start     Dose/Rate Route Frequency Ordered Stop   10/10/14 1000  vancomycin (VANCOCIN) IVPB 750 mg/150 ml premix     750 mg 150 mL/hr over 60 Minutes Intravenous Every 12 hours 10/09/14 1425     10/09/14 1600  ceFEPIme (MAXIPIME) 2 g in dextrose 5 % 50 mL IVPB     2 g 100 mL/hr over 30 Minutes Intravenous Every 12 hours 10/09/14 1425     10/08/14 1000  vancomycin (VANCOCIN) 1,500 mg in sodium chloride 0.9 % 500 mL IVPB  Status:  Discontinued     1,500 mg 250 mL/hr over 120 Minutes Intravenous Every 24 hours 10/07/14 1612 10/09/14 1425   10/07/14 1830  ceFEPIme (MAXIPIME) 2 g in dextrose 5 % 50 mL IVPB  Status:  Discontinued     2 g 100 mL/hr over 30 Minutes Intravenous Every 24 hours 10/07/14 1812 10/09/14 1425   10/07/14 1630  ceFEPIme (MAXIPIME) 2 g in dextrose 5 % 50 mL IVPB  Status:  Discontinued     2 g 100 mL/hr over 30 Minutes Intravenous Every 24 hours 10/07/14 1612 10/07/14 1812   10/07/14 1400  vancomycin (VANCOCIN) 1,250 mg in sodium chloride 0.9 % 250 mL IVPB     1,250 mg 166.7 mL/hr over 90 Minutes Intravenous  Once 10/07/14 1316 10/07/14 1500   10/07/14 1300  cefTRIAXone (ROCEPHIN) 1 g in dextrose 5 % 50 mL IVPB     1 g 100 mL/hr over 30 Minutes Intravenous  Once 10/07/14 1259 10/07/14 1355      [name] presents with weakness and SOB. Pharmacy is consulted to dose vancomycin and cefepime for UTI.   PMH: LBBB, HTN,  dyslipidemia, thrombocytopenia, CABG, ICH in 2003, Parkinson's, gastritis  AC: SCDs. SQ heparin d/c'd due to hematuria started 4/14.  ID: Urosepsis; WBC 12>8>6.5, Afebrile; LA 1.48>0.77  Vanc 4/10>> Cefepime 4/10>>  4/10 BCx: Diphtheroids x 1 4/10 urine: Negative  CV: CAD. BP some better- ASA81, Clonidine, IV Lasix, po hydralazine, Lopressor  Endo: serum glucs a little elevated, no meds  GI: PPI  Neuro: no acute issues; Parkinson's. Celexa  Nephro: CKD2. SCr 1.4>1.27>1.1>1.27, K+3.4. Flomax, Proscar  Pulm: 95/RA  Hem/Onc: hgb 11.9>12.7, plts 129>185- improved, no overt bleeding noted, possible dilution as hydrating  PTA Med Issues: irbesartan, omega-3  Best Practices: po PPI, SCDs  Plan: -Vancomycin 750mg  IV q12h. Trough tonight since continuing. -Cefepime 2g IV q12h Replace K+?  Goal of Therapy:  Vancomycin trough level 15-20 mcg/ml   Darryl [name] S. Alford Highland, PharmD, BCPS Clinical Staff Pharmacist Pager [phone]  Eilene Ghazi Stillinger 10/12/2014,10:31 AM

## 2014-10-12 NOTE — Progress Notes (Signed)
PROGRESS NOTE  Darryl [name] OQH:[id number] DOB: August 15, 1934 DOA: 10/07/2014 PCP: Tawanna Solo, MD  HPI/Recap of past 24 hours: Patient is a 80 year old male with past history of Parkinson's disease, CAD who had been discharged from the hospitalist service a week prior with UTI and fever and came back in with sepsis secondary to UTI. At that time, urine cultures inconclusive.  Patient aggressively hydrated and currently on broad-spectrum IV antibiotics. Urine cultures predominant positive for gram-positive rods. Patient self doing okay, with no complaints. No chest pain or shortness of breath. Noted persistently elevated blood pressures and given history of heart failure, BNP checked and found to be elevated at almost 900. Hematuria noted on 4/14.  Patient with no complaints. Has been ambulating some Has diuresed about 6 L since Lasix started.  Assessment/Plan::  Hematuria: Confirmed with repeat urinalysis. Have stopped subcutaneous heparin and started SCDs. Improved today. Hemoglobin stable.   Parkinson's disease: Continue home meds   Coronary atherosclerosis of native coronary artery: Stable    CKD (chronic kidney disease), stage 2: At baseline    Sepsis secondary to urinary tract infection: Blood cultures positive for diphtheroids, suspect contaminant. Continue broad-spectrum antibiotics times one more day   Urinary retention status post Foley catheter. Catheter not cause of sepsis as a cause more likely from not for antibiotic coverage. Outpatient follow-up with urology    Acute on Chronic diastolic heart failure: Confirmed with elevated BNP. Have started IV Lasix. Down 6 L. New dry weight  Obesity: Patient meets criteria with BMI greater than 30   Code Status: Full code  Family Communication: Spoke with wife today  Disposition Plan: Home tomorrow   Consultants:  None  Procedures:  None  Antibiotics:  IV vancomycin 4/10-4/16  IV Zosyn  4/10-4/16   Objective: BP 161/77 mmHg  Pulse 56  Temp(Src) 98.4 F (36.9 C) (Oral)  Resp 18  Ht 5\' 9"  (1.753 m)  Wt 91.717 kg (202 lb 3.2 oz)  BMI 29.85 kg/m2  SpO2 92%  Intake/Output Summary (Last 24 hours) at 10/12/14 1729 Last data filed at 10/12/14 1447  Gross per 24 hour  Intake    600 ml  Output   3750 ml  Net  -3150 ml   Filed Weights   10/07/14 1648 10/11/14 0432 10/12/14 0501  Weight: 95.7 kg (210 lb 15.7 oz) 95.5 kg (210 lb 8.6 oz) 91.717 kg (202 lb 3.2 oz)    Exam:   General:  Alert and oriented 3, no acute distress  Cardiovascular: Regular rate and rhythm, T4-S5, 2/6 systolic ejection murmur  Respiratory: Decreased breath sounds bibasilar  Abdomen: Soft, nontender, hypoactive bowel sounds  Musculoskeletal: Trace pitting edema bilaterally   Data Reviewed: Basic Metabolic Panel:  Recent Labs Lab 10/08/14 0759 10/09/14 0720 10/10/14 0606 10/11/14 1405 10/12/14 0516  NA 138 137 138 136 136  K 3.7 3.1* 3.5 3.2* 3.4*  CL 108 105 107 101 98  CO2 18* 27 26 25 28   GLUCOSE 121* 145* 128* 180* 142*  BUN 10 9 10 13 11   CREATININE 1.27 1.19 1.14 1.19 1.27  CALCIUM 8.4 8.4 8.5 8.9 9.1   Liver Function Tests:  Recent Labs Lab 10/07/14 1305 10/09/14 0720  AST 55* 27  ALT 32 28  ALKPHOS 102 91  BILITOT 1.9* 1.0  PROT 6.2 5.7*  ALBUMIN 2.8* 2.5*   No results for input(s): LIPASE, AMYLASE in the last 168 hours. No results for input(s): AMMONIA in the last 168 hours. CBC:  Recent Labs  Lab 10/07/14 1305 10/08/14 0759 10/10/14 0606 10/12/14 0516  WBC 11.8* 12.0* 8.1 6.5  NEUTROABS 10.2*  --   --   --   HGB 13.1 11.8* 11.9* 12.7*  HCT 35.5* 32.5* 32.3* 34.8*  MCV 87.2 88.6 85.7 86.6  PLT 145* 108* 129* 185   Cardiac Enzymes:   No results for input(s): CKTOTAL, CKMB, CKMBINDEX, TROPONINI in the last 168 hours. BNP (last 3 results)  Recent Labs  10/10/14 1302  BNP 878.5*    ProBNP (last 3 results) No results for input(s): PROBNP  in the last 8760 hours.  CBG: No results for input(s): GLUCAP in the last 168 hours.  Recent Results (from the past 240 hour(s))  Urine culture     Status: None   Collection Time: 10/03/14  4:58 PM  Result Value Ref Range Status   Specimen Description URINE, RANDOM  Final   Special Requests NONE  Final   Colony Count   Final    >=100,000 COLONIES/ML Performed at [facility]    Culture   Final    Multiple bacterial morphotypes present, none predominant. Suggest appropriate recollection if clinically indicated. Performed at [facility]    Report Status 10/04/2014 FINAL  Final  Blood culture (routine x 2)     Status: None   Collection Time: 10/03/14  6:58 PM  Result Value Ref Range Status   Specimen Description BLOOD LEFT HAND  Final   Special Requests BOTTLES DRAWN AEROBIC AND ANAEROBIC 5CC  Final   Culture   Final    NO GROWTH 5 DAYS Performed at [facility]    Report Status 10/10/2014 FINAL  Final  Blood culture (routine x 2)     Status: None   Collection Time: 10/03/14  7:56 PM  Result Value Ref Range Status   Specimen Description BLOOD HAND RIGHT  Final   Special Requests   Final    BOTTLES DRAWN AEROBIC AND ANAEROBIC 3CC PT ON ROCEPHIN   Culture   Final    NO GROWTH 5 DAYS Performed at [facility]    Report Status 10/10/2014 FINAL  Final  Blood Culture (routine x 2)     Status: None   Collection Time: 10/07/14  1:05 PM  Result Value Ref Range Status   Specimen Description BLOOD RIGHT ARM  Final   Special Requests BOTTLES DRAWN AEROBIC AND ANAEROBIC 3 CC  Final   Culture   Final    DIPHTHEROIDS(CORYNEBACTERIUM SPECIES) Note: Standardized susceptibility testing for this organism is not available. Note: Gram Stain Report Called to,Read Back By and Verified With: Joslyn Hy BY INGRAM A 10/10/14 2P Performed at [facility]    Report Status 10/11/2014 FINAL  Final  Blood Culture (routine x 2)     Status: None  (Preliminary result)   Collection Time: 10/07/14  1:30 PM  Result Value Ref Range Status   Specimen Description BLOOD LEFT HAND  Final   Special Requests BOTTLES DRAWN AEROBIC AND ANAEROBIC 5 CC  Final   Culture   Final           BLOOD CULTURE RECEIVED NO GROWTH TO DATE CULTURE WILL BE HELD FOR 5 DAYS BEFORE ISSUING A FINAL NEGATIVE REPORT Performed at [facility]    Report Status PENDING  Incomplete  Urine culture     Status: None   Collection Time: 10/07/14  1:30 PM  Result Value Ref Range Status   Specimen Description URINE, CATHETERIZED  Final  Special Requests NONE  Final   Colony Count NO GROWTH Performed at [facility]   Final   Culture NO GROWTH Performed at [facility]   Final   Report Status 10/08/2014 FINAL  Final     Studies: No results found.  Scheduled Meds: . antiseptic oral rinse  7 mL Mouth Rinse BID  . aspirin EC  81 mg Oral Q M,W,F  . ceFEPime (MAXIPIME) IV  2 g Intravenous Q12H  . citalopram  20 mg Oral Daily  . cloNIDine  0.2 mg Oral TID  . finasteride  5 mg Oral Daily  . furosemide  20 mg Intravenous Q12H  . hydrALAZINE  10 mg Oral 3 times per day  . metoprolol  50 mg Oral BID  . pantoprazole  40 mg Oral q morning - 10a  . sodium chloride  3 mL Intravenous Q12H  . tamsulosin  0.4 mg Oral Daily  . temazepam  7.5 mg Oral QHS  . vancomycin  750 mg Intravenous Q12H    Continuous Infusions:    Time spent: 15 minutes  [facility] Pager [phone]. If 7PM-7AM, please contact night-coverage at [email or web address], password [facility] 10/12/2014, 5:29 PM  LOS: 5 days

## 2014-10-13 LAB — BASIC METABOLIC PANEL
Anion gap: 14 (ref 5–15)
BUN: 13 mg/dL (ref 6–23)
CALCIUM: 9 mg/dL (ref 8.4–10.5)
CHLORIDE: 98 mmol/L (ref 96–112)
CO2: 26 mmol/L (ref 19–32)
CREATININE: 1.16 mg/dL (ref 0.50–1.35)
GFR calc non Af Amer: 59 mL/min — ABNORMAL LOW (ref >90)
GFR, EST AFRICAN AMERICAN: 68 mL/min — AB (ref >90)
GLUCOSE: 129 mg/dL — AB (ref 70–99)
POTASSIUM: 3.4 mmol/L — AB (ref 3.5–5.1)
SODIUM: 138 mmol/L (ref 135–145)

## 2014-10-13 LAB — CULTURE, BLOOD (ROUTINE X 2): Culture: NO GROWTH

## 2014-10-13 MED ORDER — CLONIDINE HCL 0.1 MG PO TABS
0.1000 mg | ORAL_TABLET | ORAL | Status: AC
  Administered 2014-10-13: 0.1 mg via ORAL
  Filled 2014-10-13: qty 1

## 2014-10-13 MED ORDER — POTASSIUM CHLORIDE ER 10 MEQ PO TBCR: 10.0000 meq | EXTENDED_RELEASE_TABLET | Freq: Every day | ORAL | Status: DC

## 2014-10-13 MED ORDER — SULFAMETHOXAZOLE-TRIMETHOPRIM 400-80 MG PO TABS: 1.0000 | ORAL_TABLET | Freq: Two times a day (BID) | ORAL | Status: DC

## 2014-10-13 MED ORDER — FUROSEMIDE 20 MG PO TABS: 20.0000 mg | ORAL_TABLET | Freq: Every day | ORAL | Status: DC

## 2014-10-13 NOTE — Discharge Instructions (Signed)

## 2014-10-13 NOTE — Discharge Summary (Addendum)
Discharge Summary  Darryl [name] [id number] DOB: 1934-08-19  PCP: Tawanna Solo, MD  Admit date: 10/07/2014 Discharge date: 10/13/2014  Time spent: 25 minutes  Recommendations for Outpatient Follow-up:  1. New medication: Bactrim DS one by mouth twice a day continued until patient has Foley catheter removed 2. New medication: Lasix 20 mg by mouth daily 3. New medication: K Dur 10 mEq by mouth daily 4. Patient will follow-up with urology in the next few weeks for Foley catheter removal 5. Patient will follow up with cardiology in the next few weeks in regards to heart failure  Discharge Diagnoses:  Active Hospital Problems   Diagnosis Date Noted  . UTI (urinary tract infection) 10/07/2014  . Chronic diastolic heart failure 27/25/3664  . Urinary retention 10/04/2014  . Sepsis 10/03/2014  . CKD (chronic kidney disease), stage III 04/25/2014  . Coronary atherosclerosis of native coronary artery   . Parkinson's disease   . LBBB (left bundle branch block) 04/13/2013  . HTN (hypertension) 04/13/2013  . Dyslipidemia 04/13/2013    Resolved Hospital Problems   Diagnosis Date Noted Date Resolved  . Leukocytosis 10/03/2014 10/04/2014    Discharge Condition: Improved, being discharged home  Diet recommendation: Heart healthy  Filed Weights   10/11/14 0432 10/12/14 0501 10/13/14 0619  Weight: 95.5 kg (210 lb 8.6 oz) 91.717 kg (202 lb 3.2 oz) 92 kg (202 lb 13.2 oz)    History of present illness:  Patient is a 80 year old male with past history of Parkinson's disease and CAD would be discharged from the hospitalist service a week prior with UTI and fever and came back with sepsis secondary to UTI and admitted on 4/10. At previous hospitalization, urine cultures inconclusive. He was sent home with by mouth antibiotics and a chronic Foley catheter due to urinary retention with plans for outpatient urology follow-up  Hospital Course:  Principal Problem:   Sepsis secondary to  urinary tract infection: UTI (urinary tract infection): Patient met criteria given tachycardia, hypotension, acute renal failure and urinary source. Repeat urine still grossly positive. Patient aggressively fluid resuscitated and blood pressure stabilized. Renal function normalized. Active Problems:   HTN (hypertension): Persistent elevated blood pressures, volume overloaded. Restarted on home medications and Lasix added on discharge. See below.   Dyslipidemia: Stable, on statin     Parkinson's disease: Stable continue home medications     CKD (chronic kidney disease), stage III: With hydration, back to baseline    Urinary retention: Patient maintained for catheter. Discharged home with follow-up with urology in the next 1-2 weeks and by mouth suppressive antibiotics.    Acute on Chronic diastolic heart failure: After several days, patient noted to have markedly elevated blood pressures, despite restarting home medications. Review of echocardiogram done last year noted grade 1 basilar dysfunction. Patient IV fluids stopped and started on Lasix. He diuresed almost 8 L. Discharging home on by mouth Lasix. He'll follow-up with his cardiologist in the next few weeks Hematuria: Patient developed this on 4/14. Subcutaneous heparin stopped. Hemoglobin level stable and hematuria resolved.  Procedures:  None  Consultations:  None  Discharge Exam: BP 180/87 mmHg  Pulse 75  Temp(Src) 97.4 F (36.3 C) (Oral)  Resp 16  Ht _0  (1.753 m)  Wt 92 kg (202 lb 13.2 oz)  BMI 29.94 kg/m2  SpO2 93%  General: Alert and oriented 3, no acute distress Cardiovascular: Regular rate and rhythm, S1-S2 Respiratory: Clear to auscultation bilaterally  Discharge Instructions You were cared for by a hospitalist during your  hospital stay. If you have any questions about your discharge medications or the care you received while you were in the hospital after you are discharged, you can call the unit and asked to  speak with the hospitalist on call if the hospitalist that took care of you is not available. Once you are discharged, your primary care physician will handle any further medical issues. Please note that NO REFILLS for any discharge medications will be authorized once you are discharged, as it is imperative that you return to your primary care physician (or establish a relationship with a primary care physician if you do not have one) for your aftercare needs so that they can reassess your need for medications and monitor your lab values.  Discharge Instructions    Diet - low sodium heart healthy    Complete by:  As directed      Increase activity slowly    Complete by:  As directed             Medication List    STOP taking these medications        cefUROXime 500 MG tablet  Commonly known as:  CEFTIN      TAKE these medications        acetaminophen 500 MG tablet  Commonly known as:  TYLENOL  Take 1,000 mg by mouth every 8 (eight) hours as needed for mild pain or moderate pain.     aspirin 81 MG tablet  Take 81 mg by mouth every Monday, Wednesday, and Friday.     citalopram 20 MG tablet  Commonly known as:  CELEXA  Take 20 mg by mouth daily.     cloNIDine 0.2 MG tablet  Commonly known as:  CATAPRES  Take 0.2-0.3 mg by mouth 2 (two) times daily. Takes 1 tablet in am and 1.5 tablets in PM daily     finasteride 5 MG tablet  Commonly known as:  PROSCAR  Take 5 mg by mouth daily.     FISH OIL PO  Take 800 mg by mouth daily. Carlson's Liquid     furosemide 20 MG tablet  Commonly known as:  LASIX  Take 1 tablet (20 mg total) by mouth daily.     irbesartan 300 MG tablet  Commonly known as:  AVAPRO  Take 300 mg by mouth every morning.     metoprolol 50 MG tablet  Commonly known as:  LOPRESSOR  Take 50 mg by mouth 2 (two) times daily.     pantoprazole 40 MG tablet  Commonly known as:  PROTONIX  Take 40 mg by mouth every morning.     potassium chloride 10 MEQ tablet    Commonly known as:  K-DUR  Take 1 tablet (10 mEq total) by mouth daily.     sulfamethoxazole-trimethoprim 400-80 MG per tablet  Commonly known as:  BACTRIM  Take 1 tablet by mouth 2 (two) times daily.  Notes to Patient:  Take two doses today at lunch and this evening, then take in morning and evening for future doses.     tamsulosin 0.4 MG Caps capsule  Commonly known as:  FLOMAX  Take 1 capsule (0.4 mg total) by mouth daily.     traMADol 50 MG tablet  Commonly known as:  ULTRAM  Take 50 mg by mouth daily as needed (pain).       Allergies  Allergen Reactions  . Aspirin Other (See Comments)    ulcers  . Celebrex [Celecoxib] Other (See Comments)  Due to ulcer.  . Nsaids Other (See Comments)    Due to ulcer.  . Toprol Xl [Metoprolol Tartrate] Swelling  . Altace [Ramipril]   . Levothyroxine Other (See Comments)    "made him feel bad"  . Biaxin [Clarithromycin] Nausea Only  . Tetracyclines & Related Nausea Only       Follow-up Information    Follow up with Tawanna Solo, MD In 1 month.   Specialty:  Family Medicine   Contact information:   [place] [address] [phone]       Please follow up.   Why:  [facility] will call you Monday to confirm appt in next few weeks      Follow up with Sueanne Margarita, MD.   Specialty:  Cardiology   Why:  Will call you Monday about your follow up appt   Contact information:   [address] [place] [address] [phone]        The results of significant diagnostics from this hospitalization (including imaging, microbiology, ancillary and laboratory) are listed below for reference.    Significant Diagnostic Studies:  US Renal  10/09/2014   IMPRESSION: Cortical thinning with increased renal echotexture compatible with chronic medical renal disease. No hydronephrosis.   Electronically Signed   By: Rolm Baptise M.D.   On: 10/09/2014 18:11   Dg Chest Port 1 View  10/07/2014    CLINICAL DATA:  Cough, shortness of breath.  EXAM: PORTABLE CHEST - 1 VIEW  COMPARISON:  None.  FINDINGS: The heart size and mediastinal contours are within normal limits. No pneumothorax or pleural effusion is noted. Status post coronary artery bypass graft. Both lungs are clear. The visualized skeletal structures are unremarkable.  IMPRESSION: No acute cardiopulmonary abnormality seen.   Electronically Signed   By: Marijo Conception, M.D.   On: 10/07/2014 14:38    Microbiology: Recent Results (from the past 240 hour(s))  Urine culture     Status: None   Collection Time: 10/03/14  4:58 PM  Result Value Ref Range Status   Specimen Description URINE, RANDOM  Final   Special Requests NONE  Final   Colony Count   Final    >=100,000 COLONIES/ML Performed at [facility]    Culture   Final    Multiple bacterial morphotypes present, none predominant. Suggest appropriate recollection if clinically indicated. Performed at [facility]    Report Status 10/04/2014 FINAL  Final  Blood culture (routine x 2)     Status: None   Collection Time: 10/03/14  6:58 PM  Result Value Ref Range Status   Specimen Description BLOOD LEFT HAND  Final   Special Requests BOTTLES DRAWN AEROBIC AND ANAEROBIC 5CC  Final   Culture   Final    NO GROWTH 5 DAYS Performed at [facility]    Report Status 10/10/2014 FINAL  Final  Blood culture (routine x 2)     Status: None   Collection Time: 10/03/14  7:56 PM  Result Value Ref Range Status   Specimen Description BLOOD HAND RIGHT  Final   Special Requests   Final    BOTTLES DRAWN AEROBIC AND ANAEROBIC 3CC PT ON ROCEPHIN   Culture   Final    NO GROWTH 5 DAYS Performed at [facility]    Report Status 10/10/2014 FINAL  Final  Blood Culture (routine x 2)     Status: None   Collection Time: 10/07/14  1:05 PM  Result Value Ref Range Status   Specimen Description BLOOD RIGHT ARM  Final   Special Requests BOTTLES DRAWN AEROBIC AND  ANAEROBIC 3 CC  Final   Culture   Final    DIPHTHEROIDS(CORYNEBACTERIUM SPECIES) Note: Standardized susceptibility testing for this organism is not available. Note: Gram Stain Report Called to,Read Back By and Verified With: Joslyn Hy BY INGRAM A 10/10/14 2P Performed at [facility]    Report Status 10/11/2014 FINAL  Final  Blood Culture (routine x 2)     Status: None   Collection Time: 10/07/14  1:30 PM  Result Value Ref Range Status   Specimen Description BLOOD LEFT HAND  Final   Special Requests BOTTLES DRAWN AEROBIC AND ANAEROBIC 5 CC  Final   Culture   Final    NO GROWTH 5 DAYS Performed at [facility]    Report Status 10/13/2014 FINAL  Final  Urine culture     Status: None   Collection Time: 10/07/14  1:30 PM  Result Value Ref Range Status   Specimen Description URINE, CATHETERIZED  Final   Special Requests NONE  Final   Colony Count NO GROWTH Performed at [facility]   Final   Culture NO GROWTH Performed at [facility]   Final   Report Status 10/08/2014 FINAL  Final     Labs: Basic Metabolic Panel:  Recent Labs Lab 10/09/14 0720 10/10/14 0606 10/11/14 1405 10/12/14 0516 10/13/14 0703  NA 137 138 136 136 138  K 3.1* 3.5 3.2* 3.4* 3.4*  CL 105 107 101 98 98  CO2 _0 GLUCOSE 145* 128* 180* 142* 129*  BUN _1 CREATININE 1.19 1.14 1.19 1.27 1.16  CALCIUM 8.4 8.5 8.9 9.1 9.0   Liver Function Tests:  Recent Labs Lab 10/07/14 1305 10/09/14 0720  AST 55* 27  ALT 32 28  ALKPHOS 102 91  BILITOT 1.9* 1.0  PROT 6.2 5.7*  ALBUMIN 2.8* 2.5*   No results for input(s): LIPASE, AMYLASE in the last 168 hours. No results for input(s): AMMONIA in the last 168 hours. CBC:  Recent Labs Lab 10/07/14 1305 10/08/14 0759 10/10/14 0606 10/12/14 0516  WBC 11.8* 12.0* 8.1 6.5  NEUTROABS 10.2*  --   --   --   HGB 13.1 11.8* 11.9* 12.7*  HCT 35.5* 32.5* 32.3* 34.8*  MCV 87.2 88.6 85.7 86.6  PLT 145*  108* 129* 185   Cardiac Enzymes: No results for input(s): CKTOTAL, CKMB, CKMBINDEX, TROPONINI in the last 168 hours. BNP: BNP (last 3 results)  Recent Labs  10/10/14 1302  BNP 878.5*    ProBNP (last 3 results) No results for input(s): PROBNP in the last 8760 hours.  CBG: No results for input(s): GLUCAP in the last 168 hours.     Signed:  Annita Brod  [facility] 10/13/2014, 2:29 PM

## 2014-10-13 NOTE — Progress Notes (Signed)
Dr. Maryland Pink notified that patient's BP after AM BP medications 180/87.  Per Dr. Maryland Pink, give afternoon hydralazine dose early and additional order for clonidine placed.  Per Dr. Maryland Pink, patient safe to discharge immediately after administration.  Will administer when available from pharmacy.

## 2014-10-13 NOTE — Progress Notes (Signed)
Patient discharged to home with wife, transported by wife.  IV removed prior to discharge; IV site clean, dry, and intact.  Discharge instructions, education, and medications discussed with patient and wife prior to discharge; both voice understanding of information.

## 2014-10-13 NOTE — Progress Notes (Signed)
Clarified with Dr. Maryland Pink that patient is to keep catheter in place until outpatient urology appointment.  Patient and wife aware.  Will switch to leg bag and continue to monitor.

## 2014-10-15 ENCOUNTER — Telehealth: Payer: Self-pay | Admitting: Cardiology

## 2014-10-15 NOTE — Telephone Encounter (Signed)
New Message  Pt d/c from hosp on 4/16. AVS- f/u w. Dr. Radford Pax. Pt has 6 mo f/u on 5/31. Pt wife calling to set up [place] wants to know if it is okay to wait until next avail PA appt in the middle of May or something sooner. Please call back and discuss.

## 2014-10-16 NOTE — Telephone Encounter (Signed)
Follow Up   Wife called to discuss sooner appt. Or if it is ok to wait until next available with [facility]. Will sch with PA, but req confirmation from nurse if this is [facility]??...  Please call

## 2014-10-16 NOTE — Telephone Encounter (Signed)
PA OV scheduled 5/11 with Dayna Dunn.

## 2014-10-16 NOTE — Telephone Encounter (Signed)
F/U with PA is fine

## 2014-10-18 DIAGNOSIS — R339 Retention of urine, unspecified: Secondary | ICD-10-CM | POA: Diagnosis not present

## 2014-10-18 DIAGNOSIS — N138 Other obstructive and reflux uropathy: Secondary | ICD-10-CM | POA: Diagnosis not present

## 2014-10-18 DIAGNOSIS — N401 Enlarged prostate with lower urinary tract symptoms: Secondary | ICD-10-CM | POA: Diagnosis not present

## 2014-10-19 DIAGNOSIS — R339 Retention of urine, unspecified: Secondary | ICD-10-CM | POA: Diagnosis not present

## 2014-10-25 ENCOUNTER — Ambulatory Visit (INDEPENDENT_AMBULATORY_CARE_PROVIDER_SITE_OTHER): Payer: Medicare Other | Admitting: Neurology

## 2014-10-25 ENCOUNTER — Encounter: Payer: Self-pay | Admitting: Neurology

## 2014-10-25 VITALS — BP 159/90 | HR 55 | Temp 97.9°F | Ht 69.0 in | Wt 199.8 lb

## 2014-10-25 DIAGNOSIS — G20 Parkinson's disease: Secondary | ICD-10-CM

## 2014-10-25 NOTE — Patient Instructions (Signed)
Overall you are doing fairly well but I do want to suggest a few things today:   Remember to drink plenty of fluid, eat healthy meals and do not skip any meals. Try to eat protein with a every meal and eat a healthy snack such as fruit or nuts in between meals. Try to keep a regular sleep-wake schedule and try to exercise daily, particularly in the form of walking, 20-30 minutes a day, if you can.   As far as your medications are concerned, I would like to suggest: Azilect  I would like to see you back in 3 months, sooner if we need to. Please call us with any interim questions, concerns, problems, updates or refill requests.   Please also call us for any test results so we can go over those with you on the phone.  My clinical assistant and will answer any of your questions and relay your messages to me and also relay most of my messages to you.   Our phone number is [phone]. We also have an after hours call service for urgent matters and there is a physician on-call for urgent questions. For any emergencies you know to call 911 or go to the nearest emergency room

## 2014-10-25 NOTE — Progress Notes (Signed)
[facility]    Provider:  Dr Darryl [name] Referring Provider: [name], MD Primary Care Physician:  Darryl Solo, MD  CC:  Parkinson's Disease (PD)  HPI:  Darryl [name] is a 80 y.o. male here as a referral from Dr. Sabra [name] for Parkinson's disease  He has been having urinary retention. He has a foley. He developed urinary tract infection and sepsis. Been trying to take the foley out, but he cannot urinate. He has an appointment for urodynamics and being treated for enlarged prostate. Urologist wanted him evaluated for urinary retention in relation to PD. He has been having urinary difficulty since 2005. This has happened in the past in 2005 after surgery, then in 2010 after syrgery where he had to wear a foley until he could urinate again. Then in November of 2014 after back surgery. This time his urinary retention happened after epidural steroid injections for LBP.  Patient has been followed in clinic for PD but has declined medication in the past as he feels his symptoms are not severe enough and manageable without medications.  Interval Update 06/12/2014: Tremor is stable. Sometimes you can see it and other times it is not there. No problems eating, dressing. Right is worse so needs to use left hand. Right >> left. Handwriting is getting worse. Walking is getting a little slower. Has been working with the cardiologist for tight blood pressure control. No muscle stiffness. No falls. Not using a walking aid. No REM sleep disorder. Sense of smell is fine. Has constipation since starting the celexa for anxiety. No orthostatic dizzines.   12/12/2013 Dr. Janann [name]: Darryl [name] is a 80 y.o. male here as a follow up from Dr. Rex [name] for tremor evaluation. Last visit was 08/2013 at which time there was concern over possible amyloid angiopathy. He was evaluated by Dr Darryl [name] with findings likely related to chronic HTN/small vessel disease. His ASA 81mg  was changed to every other day  due to bruising. Overall doing well. Continues to have difficulties with his blood pressure. Currently working with Dr Darryl [name] (cardiology).   The tremor is stable, no worsening, remains stable. He currently reports some pain radiating from lower back down the posterior surface of his right leg. He has a history of lumbar disc herniation s/p surgery.    Initial visit 07/2013 Dr. Janann [name]: First noticed tremor around 3 years ago, started on right but now involves both hands but is R>L. Described as both a rest and action/postural tremor. Bothers him the most when trying to eat. Handwriting has gotten messier, no change in size. Wife notes some generalized slowing down, some shuffling of his feet. No muscle stiffness or cramps. He does not some gait instability, has some trouble getting in and out of the car. Feels very unsteady going down stairs. Has not noticed anything that makes the tremor worse. No intake of caffeine or EtOH. Sleeps well, wife notes some REM behavior disorder (talking in his sleep). No known difficulty with sense of smell. No known stroke or TIA, has had CAD with CABG x 4.   He thinks his father may have had a tremor.   Reviewed notes, labs and imaging from outside physicians, which showed: MRI of the brain 07/2013: Abnormal MRI scan of the brain showing extensive changes of chronic microvascular disease and scattered multiple microhemorrhages of remote age. Possibilities include amyloid angiopathy versus small vessel disease.   Review of Systems: Patient complains of symptoms per HPI as well as the following symptoms: Jevity  change, difficulty urinating, headache, tremors, passing out, ringing in ears, joint pain, back pain, walking difficulty, restless legs, daytime sleepiness. Pertinent negatives per HPI. All others negative.   History   Social History  . Marital Status: Married    Spouse Name: Darryl [name]  . Number of Children: 4  . Years of Education: College    Occupational History  . Not on file.   Social History Main Topics  . Smoking status: Former Smoker -- 2.00 packs/day for 5 years    Types: Cigarettes    Quit date: 04/28/1959  . Smokeless tobacco: Never Used     Comment: 55+ yrs ago  . Alcohol Use: No  . Drug Use: No  . Sexual Activity: Not on file   Other Topics Concern  . Not on file   Social History Narrative   Patient is married to Darryl [name]), has 4 children   Patient is right handed   Education level is college   Caffeine consumption is none    Family History  Problem Relation Age of Onset  . CAD Father 65    ASCAD  . CAD Sister     Past Medical History  Diagnosis Date  . LBBB (left bundle branch block)   . Sleep disturbance   . Insomnia   . Tremor   . HTN (hypertension)     orthostatic  . Dyslipidemia   . Thrombocytopenia   . Colon polyp   . Diverticulosis   . Renal insufficiency     renal duplex scan in 2012 showed no renal artery stenosis  . Coronary atherosclerosis of native coronary artery     a. s/p CABG 11/2003. b. s/p DES to radial-OM and SVG-RCA 11/05. c. s/p BMS to LCx 11/2008.  . Lumbar spondylolysis     multilevel DJD with mild spinal stenosis L3-L5  . Headache(784.0)   . Arthritis   . Thrombocytopenia   . Elevated fasting glucose   . Gastritis   . Hiatal hernia   . Migraines   . H/O urinary retention     post surgical procedures  . Parkinson's disease   . Stroke     a. remote hx of stroke reportedly r/t HTN.  . Intracranial hemorrhage     a. small pontine Clifford 2003.  [name] Peptic ulcer disease     a. prior hx of bleeding ulcers, including 12/2003 in setting of aspirin use.  . Gastritis     a. by EGD 04/2004.  [name] Hypertensive heart disease   . Dilated aortic root 05/29/2014  . UTI (urinary tract infection)     Past Surgical History  Procedure Laterality Date  . Wisdom tooth extraction    . Esophagogastroduodenoscopy      10/2001, 12/2003, 04/2004  . Coronary artery bypass graft   2005  . Shoulder arthroscopy Left 9/10  . Appendectomy  62    1963  . Stones      hx  . Coronary angioplasty  6/10,10/05  . Cholecystectomy  1998  . Lumbar laminectomy/decompression microdiscectomy N/A 05/03/2013    Procedure: LUMBAR LAMINECTOMY/DCOMPRESSION MICRODISCECTOMY RIGHT LUMBAR FOUR FIVE;  Surgeon: [name], MD;  Location: [facility];  Service: Neurosurgery;  Laterality: N/A;  . Colonoscopy  10/2001  . Colon surgery  03/14/2008    Current Outpatient Prescriptions  Medication Sig Dispense Refill  . acetaminophen (TYLENOL) 500 MG tablet Take 1,000 mg by mouth every 8 (eight) hours as needed for mild pain or moderate pain.    [name] aspirin  81 MG tablet Take 81 mg by mouth every Monday, Wednesday, and Friday.     . citalopram (CELEXA) 20 MG tablet Take 20 mg by mouth daily.    . cloNIDine (CATAPRES) 0.2 MG tablet Take 0.2-0.3 mg by mouth 2 (two) times daily. Takes 1 tablet in am and 1.5 tablets in PM daily    . finasteride (PROSCAR) 5 MG tablet Take 5 mg by mouth daily.    . furosemide (LASIX) 20 MG tablet Take 1 tablet (20 mg total) by mouth daily. 30 tablet 1  . irbesartan (AVAPRO) 300 MG tablet Take 300 mg by mouth every morning.    . metoprolol (LOPRESSOR) 50 MG tablet Take 50 mg by mouth 2 (two) times daily.    . Omega-3 Fatty Acids (FISH OIL PO) Take 800 mg by mouth daily. Carlson's Liquid    . pantoprazole (PROTONIX) 40 MG tablet Take 40 mg by mouth every morning.    . potassium chloride (K-DUR) 10 MEQ tablet Take 1 tablet (10 mEq total) by mouth daily. 30 tablet 1  . sulfamethoxazole-trimethoprim (BACTRIM) 400-80 MG per tablet Take 1 tablet by mouth 2 (two) times daily. 30 tablet 1  . tamsulosin (FLOMAX) 0.4 MG CAPS capsule Take 1 capsule (0.4 mg total) by mouth daily. (Patient taking differently: Take 0.8 mg by mouth 2 (two) times daily. ) 40 capsule 0  . traMADol (ULTRAM) 50 MG tablet Take 50 mg by mouth daily as needed (pain).      No current facility-administered  medications for this visit.    Allergies as of 10/25/2014 - Review Complete 10/25/2014  Allergen Reaction Noted  . Aspirin Other (See Comments) 04/27/2013  . Celebrex [celecoxib] Other (See Comments) 04/10/2013  . Nsaids Other (See Comments) 04/10/2013  . Toprol xl [metoprolol tartrate] Swelling 04/10/2013  . Altace [ramipril]  04/10/2013  . Amlodipine  10/25/2014  . Levothyroxine Other (See Comments) 08/11/2013  . Biaxin [clarithromycin] Nausea Only 04/10/2013  . Tetracyclines & related Nausea Only 04/10/2013    Vitals: BP 159/90 mmHg  Pulse 55  Temp(Src) 97.9 F (36.6 C)  Ht 5\' 9"  (1.753 m)  Wt 199 lb 12.8 oz (90.629 kg)  BMI 29.49 kg/m2 Last Weight:  Wt Readings from Last 1 Encounters:  10/25/14 199 lb 12.8 oz (90.629 kg)   Last Height:   Ht Readings from Last 1 Encounters:  10/25/14 5\' 9"  (1.753 m)   Neuro:  Cranial Nerves:  The pupils are equal, round, and reactive to light. The fundi are normal and spontaneous venous pulsations are present. Visual fields are full to finger confrontation. Extraocular movements are intact. Trigeminal sensation is intact and the muscles of mastication are normal. The face is symmetric. The palate elevates in the midline. Voice is normal. Shoulder shrug is normal. The tongue has normal motion without fasciculations.   Coordination: Mild bradykinesia noted with finger tapping left greater than right, hand opening closing left greater than right and rapid alternating movement. Bradykinesia with tapping of left foot, right within normal limits.  Gait:  decreased arm swing on the left, minimal shuffling of steps, no re emergent tremor in the right, Romberg negative, multiple steps back with pull test   Motor Observation:  Minimal rest tremor noted right hand greater than left, bilateral intention and postural tremor Tone:  Mild cogwheeling bilateral wrists  Posture:  Posture is mildly stooped   Strength:  Strength is  V/V in the upper and lower limbs.       Assessment/Plan:  80 year old  gentleman presenting for follow up evaluation of tremor, which is been ongoing for multiple years and is getting progressively worse. Symptoms c/w parkinsonism, likely idiopathic but vascular PD also possible.He describes both a rest and action component, he feels action component is causing the most difficulty. MRI brain reviewed showed multiple micro-hemorrhages. Was evaluated by Dr Darryl [name] who suggested aggressive BP control of <130/90. He does have extensive changes of chronic microvascular disease and managing blood pressure and other vascular risk factors is important.    Autonomic dysfunction in Parkinson's can include urinary difficulties. Urinary urgency and frequency is more common but  impairment of bladder emptying can still be a problem in Parkinson's disease.  However it is less likely that PD is the cause of this extent of urinary retention. Do suggest trying Azilect, once a day medication. Wife and patient will consider. If workup with urologist including urodynamics does not reveal another cause of patient's urinary retention can follow-up in clinic and reconsider.    Sarina Ill, MD  [facility] [address] [place], [place] [address]  Phone [phone] Fax [phone]  A total of 30 minutes was spent face-to-face with this patient. Over half this time was spent on counseling patient on the PD diagnosis and different diagnostic and therapeutic options available.

## 2014-11-06 DIAGNOSIS — R339 Retention of urine, unspecified: Secondary | ICD-10-CM | POA: Diagnosis not present

## 2014-11-07 ENCOUNTER — Encounter: Payer: Self-pay | Admitting: Cardiology

## 2014-11-07 ENCOUNTER — Ambulatory Visit (INDEPENDENT_AMBULATORY_CARE_PROVIDER_SITE_OTHER): Payer: Medicare Other | Admitting: Cardiology

## 2014-11-07 ENCOUNTER — Ambulatory Visit: Payer: Medicare Other | Admitting: Physician Assistant

## 2014-11-07 ENCOUNTER — Other Ambulatory Visit: Payer: Self-pay | Admitting: Cardiology

## 2014-11-07 VITALS — BP 120/70 | HR 59 | Ht 68.5 in | Wt 201.8 lb

## 2014-11-07 DIAGNOSIS — I509 Heart failure, unspecified: Secondary | ICD-10-CM

## 2014-11-07 DIAGNOSIS — N289 Disorder of kidney and ureter, unspecified: Secondary | ICD-10-CM

## 2014-11-07 DIAGNOSIS — N183 Chronic kidney disease, stage 3 unspecified: Secondary | ICD-10-CM

## 2014-11-07 DIAGNOSIS — I110 Hypertensive heart disease with heart failure: Secondary | ICD-10-CM

## 2014-11-07 DIAGNOSIS — I5033 Acute on chronic diastolic (congestive) heart failure: Secondary | ICD-10-CM | POA: Diagnosis not present

## 2014-11-07 DIAGNOSIS — N390 Urinary tract infection, site not specified: Secondary | ICD-10-CM

## 2014-11-07 DIAGNOSIS — I519 Heart disease, unspecified: Secondary | ICD-10-CM | POA: Diagnosis not present

## 2014-11-07 DIAGNOSIS — I5189 Other ill-defined heart diseases: Secondary | ICD-10-CM

## 2014-11-07 LAB — BASIC METABOLIC PANEL
BUN: 18 mg/dL (ref 6–23)
CO2: 28 mEq/L (ref 19–32)
Calcium: 9.8 mg/dL (ref 8.4–10.5)
Chloride: 101 mEq/L (ref 96–112)
Creatinine, Ser: 1.57 mg/dL — ABNORMAL HIGH (ref 0.40–1.50)
GFR: 45.7 mL/min — ABNORMAL LOW (ref >60.00)
Glucose, Bld: 96 mg/dL (ref 70–99)
Potassium: 4.5 mEq/L (ref 3.5–5.1)
Sodium: 134 mEq/L — ABNORMAL LOW (ref 135–145)

## 2014-11-07 MED ORDER — FUROSEMIDE 20 MG PO TABS: 20.0000 mg | ORAL_TABLET | Freq: Every day | ORAL | Status: DC

## 2014-11-07 NOTE — Assessment & Plan Note (Signed)
Admitted in April 2016 with recurrent UTI

## 2014-11-07 NOTE — Progress Notes (Signed)
11/07/2014 Darryl [name]   10-20-1934  [id number]  Primary Physician Tawanna Solo, MD Primary Cardiologist: Dr Radford Pax  HPI:  80 y/o male followed by Dr Radford Pax with a history of CAD, s/p CABG in '05 followed by CFX BMS in 2010. Myoview was low risk in Oct 2014. He has not had angina recently. He has HTN and HTN CVD. Echo in Nov 2015 showed severe LVH, grade 1 DD, and normal LVF. He was admitted in April with a UTI then re admitted again for the same a week later. He is here today as he was instructed to follow up with his cardiologist (though we did not see him during his recent hospitalization). His wife accompanied the pt. She had a folder full of notes and questions. During his admission he apparently was aggressively  hydrated and developed acute diastolic CHF that responded to Lasix. She was told he had a "heart murmur" and that he has "heart failure". He was also seen by the heart failure nurse and given a folder of information on heart failure.           Since discharge he has done well. He denies any dyspnea. He is taking low dose Lasix daily. He has a foley in place (he apparently has urinary outlet obstruction). He is to f/u with Dr Karsten Ro tomorrow.    Current Outpatient Prescriptions  Medication Sig Dispense Refill  . acetaminophen (TYLENOL) 500 MG tablet Take 1,000 mg by mouth every 8 (eight) hours as needed for mild pain or moderate pain.    [name] aspirin 81 MG tablet Take 81 mg by mouth every Monday, Wednesday, and Friday.     . citalopram (CELEXA) 20 MG tablet Take 20 mg by mouth daily.    . cloNIDine (CATAPRES) 0.2 MG tablet Take 0.2-0.3 mg by mouth 2 (two) times daily. Takes 1 tablet in am and 1.5 tablets in PM daily    . finasteride (PROSCAR) 5 MG tablet Take 5 mg by mouth daily.    . furosemide (LASIX) 20 MG tablet Take 1 tablet (20 mg total) by mouth daily. 90 tablet 3  . irbesartan (AVAPRO) 300 MG tablet Take 300 mg by mouth every morning.    . metoprolol (LOPRESSOR)  50 MG tablet Take 50 mg by mouth 2 (two) times daily.    . Omega-3 Fatty Acids (FISH OIL PO) Take 800 mg by mouth daily. Carlson's Liquid    . pantoprazole (PROTONIX) 40 MG tablet Take 40 mg by mouth every morning.    . potassium chloride (K-DUR) 10 MEQ tablet Take 1 tablet (10 mEq total) by mouth daily. 30 tablet 1  . sulfamethoxazole-trimethoprim (BACTRIM) 400-80 MG per tablet Take 1 tablet by mouth 2 (two) times daily. 30 tablet 1  . tamsulosin (FLOMAX) 0.4 MG CAPS capsule Take 1 capsule (0.4 mg total) by mouth daily. (Patient taking differently: Take 0.8 mg by mouth 2 (two) times daily. ) 40 capsule 0  . traMADol (ULTRAM) 50 MG tablet Take 50 mg by mouth daily as needed (pain).      No current facility-administered medications for this visit.    Allergies  Allergen Reactions  . Aspirin Other (See Comments)    ulcers  . Celebrex [Celecoxib] Other (See Comments)    Due to ulcer.  . Nsaids Other (See Comments)    Due to ulcer.  . Toprol Xl [Metoprolol Tartrate] Swelling  . Altace [Ramipril]   . Amlodipine     Feet swell  .  Levothyroxine Other (See Comments)    "made him feel bad"  . Biaxin [Clarithromycin] Nausea Only  . Tetracyclines & Related Nausea Only    History   Social History  . Marital Status: Married    Spouse Name: [name]  . Number of Children: 4  . Years of Education: College   Occupational History  . Not on file.   Social History Main Topics  . Smoking status: Former Smoker -- 2.00 packs/day for 5 years    Types: Cigarettes    Quit date: 04/28/1959  . Smokeless tobacco: Never Used     Comment: 55+ yrs ago  . Alcohol Use: No  . Drug Use: No  . Sexual Activity: Not on file   Other Topics Concern  . Not on file   Social History Narrative   Patient is married to [name]), has 4 children   Patient is right handed   Education level is college   Caffeine consumption is none     Review of Systems: General: negative for chills, fever, night sweats or  weight changes.  Cardiovascular: negative for chest pain, dyspnea on exertion, edema, orthopnea, palpitations, paroxysmal nocturnal dyspnea or shortness of breath Dermatological: negative for rash Respiratory: negative for cough or wheezing Urologic: negative for hematuria Abdominal: negative for nausea, vomiting, diarrhea, bright red blood per rectum, melena, or hematemesis Neurologic: negative for visual changes, syncope, or dizziness All other systems reviewed and are otherwise negative except as noted above.    Blood pressure 120/70, pulse 59, height 5' 8.5" (1.74 m), weight 201 lb 12.8 oz (91.536 kg).  General appearance: alert, cooperative and no distress Neck: no carotid bruit and no JVD Lungs: clear to auscultation bilaterally Heart: regular rate and rhythm and no murmur appreciated Extremities: trace edema Skin: Skin color, texture, turgor normal. No rashes or lesions Neurologic: Grossly normal   ASSESSMENT AND PLAN:   Acute on chronic diastolic congestive heart failure Secondary to aggressive IV hydration during recent hospitalization for UTI   Diastolic dysfunction Grade 1 with normal LVF by echo Nov 2015   Hypertensive cardiovascular disease Severe LVH on echo Nov 2015   Hx of CABG 2005, PCI 2010 s/p CABG 11/2003. b. s/p DES to radial-OM and SVG-RCA 11/05. c. s/p BMS to LCx 11/2008. No angina   UTI (lower urinary tract infection) Admitted in April 2016 with recurrent UTI    PLAN  I spent some time discussing diastolic heart failure with the pt and his wife. I encouraged them to continue the Lasix. We'll get a BMP today. She told me he is "wiped out" in the morning after his medications. I suggested he try taking the Avapro at night. He will f/u with Dr Radford Pax.   [facility] 11/07/2014 2:43 PM

## 2014-11-07 NOTE — Assessment & Plan Note (Signed)
s/p CABG 11/2003. b. s/p DES to radial-OM and SVG-RCA 11/05. c. s/p BMS to LCx 11/2008. No angina

## 2014-11-07 NOTE — Patient Instructions (Addendum)
Medication Instructions:  Your physician has recommended you make the following change in your medication:   1. REFILL on Lasix 20 mg by mouth daily  2. Start taking your Avapro at night  Labwork: Your physician recommends that you have labs today - BMET.  Testing/Procedures: NONE  Follow-Up: Your physician recommends that you schedule a follow-up appointment as needed with Dr. Radford Pax.

## 2014-11-07 NOTE — Assessment & Plan Note (Signed)
Grade 1 with normal LVF by echo Nov 2015

## 2014-11-07 NOTE — Assessment & Plan Note (Addendum)
Secondary to aggressive IV hydration during recent hospitalization for UTI

## 2014-11-07 NOTE — Assessment & Plan Note (Signed)
Severe LVH on echo Nov 2015

## 2014-11-08 ENCOUNTER — Other Ambulatory Visit: Payer: Self-pay | Admitting: *Deleted

## 2014-11-08 DIAGNOSIS — N138 Other obstructive and reflux uropathy: Secondary | ICD-10-CM | POA: Diagnosis not present

## 2014-11-08 DIAGNOSIS — N401 Enlarged prostate with lower urinary tract symptoms: Secondary | ICD-10-CM | POA: Diagnosis not present

## 2014-11-08 DIAGNOSIS — N183 Chronic kidney disease, stage 3 unspecified: Secondary | ICD-10-CM

## 2014-11-08 DIAGNOSIS — R339 Retention of urine, unspecified: Secondary | ICD-10-CM | POA: Diagnosis not present

## 2014-11-09 ENCOUNTER — Other Ambulatory Visit: Payer: Self-pay | Admitting: Urology

## 2014-11-12 DIAGNOSIS — N401 Enlarged prostate with lower urinary tract symptoms: Secondary | ICD-10-CM | POA: Diagnosis not present

## 2014-11-13 ENCOUNTER — Encounter: Payer: Self-pay | Admitting: Cardiology

## 2014-11-14 ENCOUNTER — Encounter (HOSPITAL_BASED_OUTPATIENT_CLINIC_OR_DEPARTMENT_OTHER): Payer: Self-pay | Admitting: *Deleted

## 2014-11-14 NOTE — Progress Notes (Signed)
Pt instructed npo pmn 5/22 x clonidine, lopressor, protonix, k-dur, bactrim w sip of water.  Pt to bring all meds w/ him to [facility]. Pt aware he is to spend the nite.  To [facility] 5/23 @ 0815.  Needs istat on arrival.  Ekg, cxr w chart.

## 2014-11-16 ENCOUNTER — Encounter: Payer: Self-pay | Admitting: Cardiology

## 2014-11-16 ENCOUNTER — Telehealth: Payer: Self-pay | Admitting: Cardiology

## 2014-11-16 NOTE — Telephone Encounter (Signed)
Wife calling with several questions. Could not see that we have permission to speak with her regarding his care.  She is at her daughter's house so he can not give [place] permission to speak with her.  States she will just send an email.

## 2014-11-16 NOTE — Telephone Encounter (Signed)
New Message      Pt's wife calling stating that Kerin Ransom recommended that pt's upcoming appt w/ Dr. Radford Pax be moved back later and she wants to speak to [facility] about moving appt and they received a letter in the mail stating that pt needs to have lab work in about a month and pt's wife wants to know why. Please call back and advise.

## 2014-11-19 ENCOUNTER — Ambulatory Visit (HOSPITAL_BASED_OUTPATIENT_CLINIC_OR_DEPARTMENT_OTHER): Payer: Medicare Other | Admitting: Anesthesiology

## 2014-11-19 ENCOUNTER — Encounter (HOSPITAL_BASED_OUTPATIENT_CLINIC_OR_DEPARTMENT_OTHER): Payer: Self-pay | Admitting: *Deleted

## 2014-11-19 ENCOUNTER — Ambulatory Visit (HOSPITAL_BASED_OUTPATIENT_CLINIC_OR_DEPARTMENT_OTHER)
Admission: RE | Admit: 2014-11-19 | Discharge: 2014-11-20 | Disposition: A | Discharge status: Home / Self Care | Payer: Medicare Other | Source: Ambulatory Visit | Attending: Urology | Admitting: Urology

## 2014-11-19 ENCOUNTER — Encounter (HOSPITAL_BASED_OUTPATIENT_CLINIC_OR_DEPARTMENT_OTHER)
Admission: RE | Disposition: A | Discharge status: Home / Self Care | Payer: Self-pay | Source: Ambulatory Visit | Attending: Urology

## 2014-11-19 DIAGNOSIS — N319 Neuromuscular dysfunction of bladder, unspecified: Secondary | ICD-10-CM | POA: Diagnosis not present

## 2014-11-19 DIAGNOSIS — F419 Anxiety disorder, unspecified: Secondary | ICD-10-CM | POA: Insufficient documentation

## 2014-11-19 DIAGNOSIS — Z87891 Personal history of nicotine dependence: Secondary | ICD-10-CM | POA: Insufficient documentation

## 2014-11-19 DIAGNOSIS — M1990 Unspecified osteoarthritis, unspecified site: Secondary | ICD-10-CM | POA: Insufficient documentation

## 2014-11-19 DIAGNOSIS — Z87442 Personal history of urinary calculi: Secondary | ICD-10-CM | POA: Insufficient documentation

## 2014-11-19 DIAGNOSIS — I10 Essential (primary) hypertension: Secondary | ICD-10-CM | POA: Diagnosis not present

## 2014-11-19 DIAGNOSIS — N401 Enlarged prostate with lower urinary tract symptoms: Secondary | ICD-10-CM | POA: Insufficient documentation

## 2014-11-19 DIAGNOSIS — Z888 Allergy status to other drugs, medicaments and biological substances status: Secondary | ICD-10-CM | POA: Insufficient documentation

## 2014-11-19 DIAGNOSIS — I252 Old myocardial infarction: Secondary | ICD-10-CM | POA: Insufficient documentation

## 2014-11-19 DIAGNOSIS — Z7982 Long term (current) use of aspirin: Secondary | ICD-10-CM | POA: Diagnosis not present

## 2014-11-19 DIAGNOSIS — Z8719 Personal history of other diseases of the digestive system: Secondary | ICD-10-CM | POA: Diagnosis not present

## 2014-11-19 DIAGNOSIS — Z881 Allergy status to other antibiotic agents status: Secondary | ICD-10-CM | POA: Insufficient documentation

## 2014-11-19 DIAGNOSIS — Z79899 Other long term (current) drug therapy: Secondary | ICD-10-CM | POA: Diagnosis not present

## 2014-11-19 DIAGNOSIS — Z951 Presence of aortocoronary bypass graft: Secondary | ICD-10-CM | POA: Diagnosis not present

## 2014-11-19 DIAGNOSIS — R338 Other retention of urine: Secondary | ICD-10-CM | POA: Insufficient documentation

## 2014-11-19 DIAGNOSIS — N210 Calculus in bladder: Secondary | ICD-10-CM | POA: Insufficient documentation

## 2014-11-19 HISTORY — PX: CYSTOSCOPY WITH LITHOLAPAXY: SHX1425

## 2014-11-19 HISTORY — DX: Presence of spectacles and contact lenses: Z97.3

## 2014-11-19 HISTORY — DX: Unspecified hearing loss, unspecified ear: H91.90

## 2014-11-19 HISTORY — DX: Other retention of urine: R33.8

## 2014-11-19 HISTORY — DX: Benign prostatic hyperplasia with lower urinary tract symptoms: N40.1

## 2014-11-19 HISTORY — PX: TRANSURETHRAL RESECTION OF PROSTATE: SHX73

## 2014-11-19 LAB — POCT I-STAT, CHEM 8
BUN: 21 mg/dL — ABNORMAL HIGH (ref 6–20)
Calcium, Ion: 1.28 mmol/L (ref 1.13–1.30)
Chloride: 103 mmol/L (ref 101–111)
Creatinine, Ser: 1.4 mg/dL — ABNORMAL HIGH (ref 0.61–1.24)
Glucose, Bld: 126 mg/dL — ABNORMAL HIGH (ref 65–99)
HCT: 40 % (ref 39.0–52.0)
Hemoglobin: 13.6 g/dL (ref 13.0–17.0)
Potassium: 3.9 mmol/L (ref 3.5–5.1)
Sodium: 140 mmol/L (ref 135–145)
TCO2: 21 mmol/L (ref 0–100)

## 2014-11-19 SURGERY — TRANSURETHRAL RESECTION OF THE PROSTATE WITH GYRUS INSTRUMENTS
Anesthesia: General | Site: Prostate

## 2014-11-19 MED ORDER — FENTANYL CITRATE (PF) 100 MCG/2ML IJ SOLN
INTRAMUSCULAR | Status: DC | PRN
  Administered 2014-11-19 (×2): 50 ug via INTRAVENOUS

## 2014-11-19 MED ORDER — SODIUM CHLORIDE 0.9 % IR SOLN
Status: DC | PRN
  Administered 2014-11-19 (×2): 6000 mL
  Administered 2014-11-19: 6500 mL
  Administered 2014-11-19 (×2): 6000 mL

## 2014-11-19 MED ORDER — FENTANYL CITRATE (PF) 100 MCG/2ML IJ SOLN
INTRAMUSCULAR | Status: AC
  Filled 2014-11-19: qty 2

## 2014-11-19 MED ORDER — LIDOCAINE HCL (CARDIAC) 20 MG/ML IV SOLN
INTRAVENOUS | Status: DC | PRN
  Administered 2014-11-19: 80 mg via INTRAVENOUS

## 2014-11-19 MED ORDER — ACETAMINOPHEN 325 MG PO TABS
650.0000 mg | ORAL_TABLET | ORAL | Status: DC | PRN
  Administered 2014-11-19: 325 mg via ORAL
  Administered 2014-11-20: 650 mg via ORAL
  Filled 2014-11-19: qty 2

## 2014-11-19 MED ORDER — CIPROFLOXACIN IN D5W 400 MG/200ML IV SOLN
INTRAVENOUS | Status: AC
  Filled 2014-11-19: qty 200

## 2014-11-19 MED ORDER — ONDANSETRON HCL 4 MG/2ML IJ SOLN
INTRAMUSCULAR | Status: DC | PRN
  Administered 2014-11-19: 4 mg via INTRAVENOUS

## 2014-11-19 MED ORDER — CIPROFLOXACIN IN D5W 400 MG/200ML IV SOLN
INTRAVENOUS | Status: DC | PRN
  Administered 2014-11-19: 400 mg via INTRAVENOUS

## 2014-11-19 MED ORDER — STERILE WATER FOR IRRIGATION IR SOLN
Status: DC | PRN
  Administered 2014-11-19: 500 mL

## 2014-11-19 MED ORDER — ACETAMINOPHEN 500 MG PO TABS
1000.0000 mg | ORAL_TABLET | Freq: Four times a day (QID) | ORAL | Status: AC
  Filled 2014-11-19: qty 2

## 2014-11-19 MED ORDER — ACETAMINOPHEN 325 MG PO TABS
ORAL_TABLET | ORAL | Status: AC
  Filled 2014-11-19: qty 1

## 2014-11-19 MED ORDER — HYDROCODONE-ACETAMINOPHEN 5-325 MG PO TABS
ORAL_TABLET | ORAL | Status: AC
  Filled 2014-11-19: qty 2

## 2014-11-19 MED ORDER — PHENAZOPYRIDINE HCL 200 MG PO TABS: 200.0000 mg | ORAL_TABLET | Freq: Three times a day (TID) | ORAL | Status: DC | PRN

## 2014-11-19 MED ORDER — BELLADONNA ALKALOIDS-OPIUM 16.2-60 MG RE SUPP
1.0000 | Freq: Four times a day (QID) | RECTAL | Status: DC | PRN
  Filled 2014-11-19: qty 1

## 2014-11-19 MED ORDER — MIDAZOLAM HCL 2 MG/2ML IJ SOLN
INTRAMUSCULAR | Status: AC
  Filled 2014-11-19: qty 2

## 2014-11-19 MED ORDER — BACITRACIN-NEOMYCIN-POLYMYXIN 400-5-5000 EX OINT
1.0000 "application " | TOPICAL_OINTMENT | Freq: Three times a day (TID) | CUTANEOUS | Status: DC | PRN
  Filled 2014-11-19: qty 1

## 2014-11-19 MED ORDER — DEXAMETHASONE SODIUM PHOSPHATE 4 MG/ML IJ SOLN
INTRAMUSCULAR | Status: DC | PRN
  Administered 2014-11-19: 10 mg via INTRAVENOUS

## 2014-11-19 MED ORDER — LACTATED RINGERS IV SOLN
INTRAVENOUS | Status: DC
  Administered 2014-11-19 (×2): via INTRAVENOUS
  Filled 2014-11-19: qty 1000

## 2014-11-19 MED ORDER — HYDRALAZINE HCL 20 MG/ML IJ SOLN
INTRAMUSCULAR | Status: DC | PRN
  Administered 2014-11-19 (×2): 5 mg via INTRAVENOUS

## 2014-11-19 MED ORDER — HYDROCODONE-ACETAMINOPHEN 5-325 MG PO TABS
1.0000 | ORAL_TABLET | ORAL | Status: DC | PRN
  Administered 2014-11-19: 1 via ORAL
  Administered 2014-11-19: 2 via ORAL
  Administered 2014-11-20: 1 via ORAL
  Filled 2014-11-19: qty 2

## 2014-11-19 MED ORDER — ACETAMINOPHEN 10 MG/ML IV SOLN
INTRAVENOUS | Status: DC | PRN
  Administered 2014-11-19: 1000 mg via INTRAVENOUS

## 2014-11-19 MED ORDER — FENTANYL CITRATE (PF) 100 MCG/2ML IJ SOLN
25.0000 ug | INTRAMUSCULAR | Status: DC | PRN
  Administered 2014-11-19 (×4): 25 ug via INTRAVENOUS
  Filled 2014-11-19: qty 1

## 2014-11-19 MED ORDER — HYDROMORPHONE HCL 1 MG/ML IJ SOLN
0.5000 mg | INTRAMUSCULAR | Status: DC | PRN
  Filled 2014-11-19: qty 1

## 2014-11-19 MED ORDER — ONDANSETRON HCL 4 MG/2ML IJ SOLN
4.0000 mg | INTRAMUSCULAR | Status: DC | PRN
  Filled 2014-11-19: qty 2

## 2014-11-19 MED ORDER — PROPOFOL 10 MG/ML IV BOLUS
INTRAVENOUS | Status: DC | PRN
  Administered 2014-11-19: 50 mg via INTRAVENOUS
  Administered 2014-11-19: 200 mg via INTRAVENOUS

## 2014-11-19 MED ORDER — FENTANYL CITRATE (PF) 100 MCG/2ML IJ SOLN
INTRAMUSCULAR | Status: AC
  Filled 2014-11-19: qty 4

## 2014-11-19 MED ORDER — CIPROFLOXACIN IN D5W 400 MG/200ML IV SOLN
400.0000 mg | Freq: Two times a day (BID) | INTRAVENOUS | Status: AC
  Administered 2014-11-19: 400 mg via INTRAVENOUS
  Filled 2014-11-19: qty 200

## 2014-11-19 MED ORDER — HYDROCODONE-ACETAMINOPHEN 5-325 MG PO TABS
ORAL_TABLET | ORAL | Status: AC
  Filled 2014-11-19: qty 1

## 2014-11-19 MED ORDER — TRAMADOL HCL 50 MG PO TABS: 50.0000 mg | ORAL_TABLET | Freq: Four times a day (QID) | ORAL | Status: DC | PRN

## 2014-11-19 SURGICAL SUPPLY — 23 items
BAG DRAIN URO-CYSTO SKYTR STRL (DRAIN) ×3 IMPLANT
BAG DRN UROCATH (DRAIN) ×2
BAG URINE DRAINAGE (UROLOGICAL SUPPLIES) ×1 IMPLANT
CANISTER SUCT LVC 12 LTR MEDI- (MISCELLANEOUS) ×4 IMPLANT
CATH FOLEY 3WAY 30CC 22F (CATHETERS) ×1 IMPLANT
CLOTH BEACON ORANGE TIMEOUT ST (SAFETY) ×3 IMPLANT
EVACUATOR MICROVAS BLADDER (UROLOGICAL SUPPLIES) ×1 IMPLANT
GLOVE BIO SURGEON STRL SZ 6.5 (GLOVE) ×1 IMPLANT
GLOVE BIO SURGEON STRL SZ8 (GLOVE) ×3 IMPLANT
GLOVE BIOGEL PI IND STRL 6.5 (GLOVE) IMPLANT
GLOVE BIOGEL PI INDICATOR 6.5 (GLOVE) ×2
GOWN STRL REUS W/ TWL LRG LVL3 (GOWN DISPOSABLE) ×2 IMPLANT
GOWN STRL REUS W/ TWL XL LVL3 (GOWN DISPOSABLE) ×2 IMPLANT
GOWN STRL REUS W/TWL LRG LVL3 (GOWN DISPOSABLE) ×3
GOWN STRL REUS W/TWL XL LVL3 (GOWN DISPOSABLE) ×3
HOLDER FOLEY CATH W/STRAP (MISCELLANEOUS) ×1 IMPLANT
IV NS IRRIG 3000ML ARTHROMATIC (IV SOLUTION) ×20 IMPLANT
LOOP CUT BIPOLAR 24F LRG (ELECTROSURGICAL) ×5 IMPLANT
NS IRRIG 500ML POUR BTL (IV SOLUTION) ×1 IMPLANT
PACK CYSTO (CUSTOM PROCEDURE TRAY) ×3 IMPLANT
SET ASPIRATION TUBING (TUBING) ×3 IMPLANT
SYR 30ML LL (SYRINGE) ×2 IMPLANT
WATER STERILE IRR 500ML POUR (IV SOLUTION) ×2 IMPLANT

## 2014-11-19 NOTE — Transfer of Care (Signed)
Immediate Anesthesia Transfer of Care Note  Patient: Darryl [name]  Procedure(s) Performed: Procedure(s): TRANSURETHRAL RESECTION OF THE PROSTATE WITH GYRUS INSTRUMENTS (N/A) CYSTOSCOPY WITH LITHOLAPAXY (N/A)  Patient Location: PACU  Anesthesia Type:General  Level of Consciousness: awake, alert , oriented and patient cooperative  Airway & Oxygen Therapy: Patient Spontanous Breathing and Patient connected to nasal cannula oxygen  Post-op Assessment: Report given to RN and Post -op Vital signs reviewed and stable  Post vital signs: Reviewed and stable  Last Vitals:  Filed Vitals:   11/19/14 0834  BP: 170/96  Pulse: 55  Temp: 36.3 C  Resp: 12    Complications: No apparent anesthesia complications

## 2014-11-19 NOTE — Anesthesia Postprocedure Evaluation (Signed)
  Anesthesia Post-op Note  Patient: Darryl [name]  Procedure(s) Performed: Procedure(s) (LRB): TRANSURETHRAL RESECTION OF THE PROSTATE WITH GYRUS INSTRUMENTS (N/A) CYSTOSCOPY WITH LITHOLAPAXY (N/A)  Patient Location: PACU  Anesthesia Type: General  Level of Consciousness: awake and alert   Airway and Oxygen Therapy: Patient Spontanous Breathing  Post-op Pain: mild  Post-op Assessment: Post-op Vital signs reviewed, Patient's Cardiovascular Status Stable, Respiratory Function Stable, Patent Airway and No signs of Nausea or vomiting  Last Vitals:  Filed Vitals:   11/19/14 1230  BP: 165/84  Pulse: 55  Temp:   Resp: 10    Post-op Vital Signs: stable   Complications: No apparent anesthesia complications

## 2014-11-19 NOTE — Op Note (Signed)
PATIENT:  Darryl [name]  PRE-OPERATIVE DIAGNOSIS: 1. BPH with urinary retention  POST-OPERATIVE DIAGNOSIS: 1. BPH with urinary retention 2. Bladder stone  PROCEDURE: 1. TURP 2. Cystolitholapaxy (1 cm)  SURGEON:  Claybon Jabs  INDICATION: Darryl [name] is a 80 year old male who has a history of urinary retention due to BPH. He recently developed urinary retention and was placed on maximum medical management with tamsulosin 0.8 mg and finasteride. He failed voiding trials and underwent urodynamics which revealed detrusor contractility was present but obstruction and is therefore brought to the operating room for transurethral resection of his prostate. His preop urine culture was positive and he was treated according to sensitivities for one week up until the day of his surgery. He then received Cipro 400 mg preop as well.  ANESTHESIA:  General  EBL:  200 mL  DRAINS: 22 French three-way Foley catheter  LOCAL MEDICATIONS USED:  None  SPECIMEN:  Prostate chips to pathology  After informed consent the patient was brought to the major OR and placed on the table. He was administered general anesthesia and then moved to the dorsal lithotomy position. His genitalia was sterilely prepped and draped and an official timeout was then performed.  Initially the 26 French resectoscope sheath with the visual obturator was passed into the bladder and the obturator removed. I then inserted the resectoscope element with 30 lens and  performed a systematic inspection of the bladder. I noted the bladder had 1+ trabeculation as well as a 1 cm stone on the floor of the bladder that was disc-shaped. The ureteral orifices were noted to be of normal configuration and position. Withdrawing the scope into the prostatic urethra I noted obstructing bilobar hypertrophy with elongation of the prostatic urethra with no significant median lobe.  I switched to the 35 French cystoscope and used the alligator  forceps and was able to break up the stone with the forceps and then used the Microvasive evacuator to remove all stone fragments from the bladder. Reinspection revealed all of the stone fragments had been evacuated from the bladder. I then reinserted the resectoscope.  I then began resecting the left lobe of the prostate by resecting first from the level of the bladder neck back to the level of the Veru at the 5:00 position and then progressed in a counterclockwise direction resecting all of the adenomatous tissue of the left lobe down to the surgical capsule. Bleeding points were cauterized as they were encountered. I then turned my attention to the right lobe of the prostate and it was resected in an identical fashion. Tissue in the area of the apex was then resected circumferentially with care being taken to maintain the resection proximal to the Veru at all times. The prostatic chips were then flushed into the bladder and the Microvasive evacuator was then used to evacuate all chips from the bladder. Reinspection of the bladder revealed the mucosa to be intact, the ureteral orifices intact as well and well away from the bladder neck and area of resection. There were no prostatic chips remaining within the bladder. The prostatic capsule was intact throughout with no perforation and there was no active bleeding noted at the end of the procedure.  The resectoscope was therefore removed and the 22 French three-way Foley catheter was then inserted and balloon was filled to 30 cc. This was placed on mild traction and the bladder was irrigated with the irrigant returning clear. The catheter was then hooked to closed system drainage and  continuous irrigation and the patient was awakened and taken to recovery room in stable and satisfactory condition. He tolerated the procedure well and there were no intraoperative complications.    PLAN OF CARE: Discharge to home after PACU  PATIENT DISPOSITION:  PACU -  hemodynamically stable.

## 2014-11-19 NOTE — Anesthesia Procedure Notes (Signed)
Procedure Name: LMA Insertion Date/Time: 11/19/2014 9:53 AM Performed by: Wanita Chamberlain Pre-anesthesia Checklist: Patient identified, Timeout performed, Emergency Drugs available, Suction available and Patient being monitored Patient Re-evaluated:Patient Re-evaluated prior to inductionOxygen Delivery Method: Circle system utilized Preoxygenation: Pre-oxygenation with 100% oxygen Intubation Type: IV induction Ventilation: Mask ventilation without difficulty LMA: LMA inserted LMA Size: 5.0 Number of attempts: 1 Airway Equipment and Method: Bite block Placement Confirmation: positive ETCO2 and breath sounds checked- equal and bilateral Tube secured with: Tape Dental Injury: Teeth and Oropharynx as per pre-operative assessment

## 2014-11-19 NOTE — Progress Notes (Signed)
Patient ID: Darryl [name], male   DOB: [birth year], 80 y.o.   MRN: [id number] Night of surgery note  Subjective: The patient is doing well.  No complaints.  Objective: Vital signs in last 24 hours: Temp:  [96.9 F (36.1 C)-97.7 F (36.5 C)] 97.7 F (36.5 C) (05/23 1530) Pulse Rate:  [54-62] 59 (05/23 1530) Resp:  [10-17] 14 (05/23 1530) BP: (145-190)/(68-98) 153/68 mmHg (05/23 1530) SpO2:  [97 %-100 %] 97 % (05/23 1530) Weight:  [90.266 kg (199 lb)] 90.266 kg (199 lb) (05/23 0834)  Intake/Output this shift: Total I/O In: 4695 [P.O.:460; I.V.:1015; Other:1850] Out: 0722 [Urine:6575]  Physical Exam:  General: Alert and oriented. Abdomen: Soft, Nondistended. His Foley is draining and slightly pink on CBI  Lab Results:  Recent Labs  11/19/14 0920  HGB 13.6  HCT 40.0    Assessment/Plan: 1) Continue to monitor 2) Per orders   Ezreal Turay C. Karsten Ro, Carl C 11/19/2014, 3:56 PM

## 2014-11-19 NOTE — Anesthesia Preprocedure Evaluation (Addendum)
Anesthesia Evaluation  Patient identified by MRN, date of birth, ID band Patient awake    Reviewed: Allergy & Precautions, H&P , NPO status , Patient's Chart, lab work & pertinent test results, reviewed documented beta blocker date and time   History of Anesthesia Complications Negative for: history of anesthetic complications  Airway Mallampati: II  TM Distance: >3 FB Neck ROM: Full    Dental no notable dental hx. (+) Teeth Intact, Dental Advisory Given, Caps,    Pulmonary neg pulmonary ROS, former smoker,    Pulmonary exam normal       Cardiovascular hypertension, Pt. on medications and Pt. on home beta blockers + CAD, + Cardiac Stents (Cx stent '10), + CABG ('05) and +CHF Normal cardiovascular exam+ dysrhythmias Rhythm:Regular Rate:Bradycardia  10/14 stress test: EF 58%, no ischemia. LBBB. Diastolic CHF   Neuro/Psych  Headaches, Anxiety Small intracranial hemorrhage 2003. Parkinson's disease  Neuromuscular disease CVA    GI/Hepatic negative GI ROS, Neg liver ROS, hiatal hernia, PUD,   Endo/Other  negative endocrine ROS  Renal/GU Renal InsufficiencyRenal disease (creat 1.34)Stage 3 chronic kidney disease     Musculoskeletal  (+) Arthritis -, Osteoarthritis,    Abdominal Normal abdominal exam  (+) + obese,   Peds  Hematology   Anesthesia Other Findings   Reproductive/Obstetrics                         Anesthesia Physical Anesthesia Plan  ASA: III  Anesthesia Plan: General   Post-op Pain Management:    Induction: Intravenous  Airway Management Planned: LMA  Additional Equipment:   Intra-op Plan:   Post-operative Plan:   Informed Consent:   Plan Discussed with: Surgeon  Anesthesia Plan Comments:         Anesthesia Quick Evaluation

## 2014-11-19 NOTE — H&P (Signed)
Darryl [name] is a 80 year-old male with urinary retention.   Past Medical History Problems  1. History of Anxiety (F41.9) 2. History of arthritis (Z87.39) 3. History of cardiac disorder (Z86.79) 4. History of gastric ulcer (Z87.19) 5. History of hypertension (Z86.79) 6. History of myocardial infarction (I25.2) 7. History of renal calculi (Z61.096)     Prostate nodule: He was referred by Dr. Rex Kras in 10/14 for a prostate nodule but the patient canceled his appointment. His PSA in 1/14 was 2.22. He was found on DRE to have a 5 mm nodule in the left base.    Urinary retention: He has a history of urinary retention that occurred with shoulder surgery in 9/10.   He then redeveloped urinary retention after back surgery in 11/14.  In 4/16 again developed retention after a steroid injection to his back. 1100 cc were found within the bladder, an MRI scan obtained to rule out neurologic causes was negative.  Current treatment: Tamsulosin 0.8 mg. and finasteride.    History of nephrolithiasis: He had a stone when he was 80 years old. He has not had any further stones since that time.    Interval history:    Surgical History Problems  1. History of Appendectomy 2. History of Back Surgery 3. History of CABG (CABG) 4. History of Coronary Artery Surgery 5. History of Gallbladder Surgery 6. History of Shoulder Surgery Left  Current Meds 1. Aspirin EC Low Dose 81 MG Oral Tablet Delayed Release;  Therapy: (Recorded:16Jun2015) to Recorded 2. Avapro 300 MG Oral Tablet;  Therapy: (Recorded:10Nov2014) to Recorded 3. CloNIDine HCl - 0.2 MG Oral Tablet;  Therapy: (Recorded:10Nov2014) to Recorded 4. Finasteride 5 MG Oral Tablet; TAKE ONE TABLET BY MOUTH ONCE DAILY;  Therapy: 05Apr2016 to (Evaluate:31Mar2017)  Requested for: 05Apr2016; Last  Rx:05Apr2016 Ordered 5. Fish Oil 1000 MG Oral Capsule;  Therapy: (Recorded:10Nov2014) to Recorded 6. Lasix 20 MG Oral Tablet;  Therapy:  (Recorded:21Apr2016) to Recorded 7. Metoprolol Tartrate 25 MG Oral Tablet;  Therapy: (Recorded:10Nov2014) to Recorded 8. Protonix 40 MG Oral Tablet Delayed Release;  Therapy: (Recorded:10Nov2014) to Recorded 9. Sulfamethoxazole-Trimethoprim TABS;  Therapy: (Recorded:21Apr2016) to Recorded 10. Tamsulosin HCl - 0.4 MG Oral Capsule; TAKE 2 CAPSULES BY MOUTH AT BEDTIME    Requested for: 05Apr2016; Last Rx:05Apr2016 Ordered 11. TraMADol HCl - 50 MG Oral Tablet;   Therapy: (Recorded:10Nov2014) to Recorded 12. Tylenol Arthritis Pain TBCR;   Therapy: (Recorded:05Apr2016) to Recorded  Allergies Medication  1. Biaxin TABS 2. Tetracyclines 3. Aspirin TABS  Family History Problems  1. Family history of myocardial infarction (Z82.49) : Father  Social History Problems  1. Denied: History of Alcohol use 2. Denied: History of Caffeine use 3. Former smoker [phone])   [id number] years ago 68. Married  Review of Systems Genitourinary and gastrointestinal system(s) were reviewed and pertinent findings if present are noted and are otherwise negative.  Genitourinary: nocturia and urinary stream starts and stops.  Hematologic/Lymphatic: a tendency to easily bruise.  Neurological: headache.  Psychiatric: anxiety.   Vitals  Height: 5 ft 8.5 in  Weight: 198 lb  BMI Calculated: 29.67  BSA Calculated: 2.05  Blood Pressure: 124 / 77  Heart Rate: 57  Physical Exam Constitutional: Well nourished and well developed . No acute distress.  ENT:. The ears and nose are normal in appearance.  Neck: The appearance of the neck is normal and no neck mass is present.  Pulmonary: No respiratory distress and normal respiratory rhythm and effort.  Cardiovascular: Heart rate and rhythm are normal .  No peripheral edema.  Abdomen: The abdomen is soft and nontender. No masses are palpated. No CVA tenderness. No hernias are palpable. No hepatosplenomegaly noted.  Rectal: Rectal exam demonstrates normal  sphincter tone, no tenderness and no masses. The prostate has a palpable nodule involving the left, base of the prostate and is not tender. The left seminal vesicle is nonpalpable. The right seminal vesicle is nonpalpable. The perineum is normal on inspection.  Genitourinary: Examination of the penis demonstrates an indwelling catheter, but no discharge, no masses, no lesions and a normal meatus. The penis is circumcised. The scrotum is without lesions. The right epididymis is palpably normal and non-tender. The left epididymis is palpably normal and non-tender. The right testis is non-tender and without masses. The left testis is non-tender and without masses.  Lymphatics: The femoral and inguinal nodes are not enlarged or tender.  Skin: Normal skin turgor, no visible rash and no visible skin lesions.  Neuro/Psych:. Mood and affect are appropriate.    Results/Data    Urodynamics 11/06/14: Study was performed to evaluate urinary retention.      CMG: He had a maximum cystometric capacity of 400 cc with some loss of compliance and an end filling pressure of 9 cm H2O. Normal sensation. Instability at 332 cc with a contraction pressure of 35 cm H2O however he was unable to void off of this unstable contraction.  Pressure-flow: He did generate a voluntary contraction and voided 90 cc with a maximum flow rate of 5 cc/second and maximum detrusor pressure of 27 cm H2O. PVR was 300 cc.  EMG: normal  Fluoroscopy: Mild trabeculation and indentation of the bladder base by the prostate    Impression: He did have some slight loss of compliance with detrusor instability as well as a voluntary contraction with an obstructed flow pattern. He appears to have prostatic enlargement as his primary problem with some secondary detrusor instability.     Assessment  I went over the results of the urodynamic study with the patient today. We discussed each of the findings of the study and the normal/anticipated  results as well as the significance of the patient's results in relation to the subjective symptoms. He does have detrusor contractions and was able to void a small amount. He also had some detrusor instability which may be in part secondary to bladder irritation from his indwelling Foley catheter and recent UTI but this study primarily reveals outlet obstruction as the cause of his urinary retention and an adequate detrusor contraction. We therefore discussed the treatment options which would be continued Foley catheterization while remaining on tamsulosin and finasteride with intermittent voiding trials. We also discussed intermittent self-catheterization as a way of getting his catheter out and allowing him to determine if he is voiding spontaneously on maximum medical management. He was not interested in performing self-catheterization. We then discussed a transurethral resection of the prostate. I went over the procedure with him in detail and drew pictures. We discussed the probability of success and the possibility of the presence of continued detrusor instability necessitating pharmacologic therapy to control this. We also discussed the need for an overnight stay in the hospital, the need for urine culture to be performed one week prior to the procedure and the anticipated postoperative course. I have answered all of his and his wife's questions regarding this injury would like to proceed with a transurethral resection of the prostate.  Urine cult. 11/12/14 - Enterobacter sensitive to Levaquin, Cipro, gentamicin and tobramycin.  He was  placed on Levaquin and remained on this up until the day of his surgery.     Plan  He will be scheduled for TURP.

## 2014-11-20 ENCOUNTER — Encounter (HOSPITAL_BASED_OUTPATIENT_CLINIC_OR_DEPARTMENT_OTHER): Payer: Self-pay | Admitting: Urology

## 2014-11-20 DIAGNOSIS — N319 Neuromuscular dysfunction of bladder, unspecified: Secondary | ICD-10-CM | POA: Diagnosis not present

## 2014-11-20 DIAGNOSIS — N401 Enlarged prostate with lower urinary tract symptoms: Secondary | ICD-10-CM | POA: Diagnosis not present

## 2014-11-20 DIAGNOSIS — N210 Calculus in bladder: Secondary | ICD-10-CM | POA: Diagnosis not present

## 2014-11-20 DIAGNOSIS — F419 Anxiety disorder, unspecified: Secondary | ICD-10-CM | POA: Diagnosis not present

## 2014-11-20 DIAGNOSIS — I10 Essential (primary) hypertension: Secondary | ICD-10-CM | POA: Diagnosis not present

## 2014-11-20 DIAGNOSIS — R338 Other retention of urine: Secondary | ICD-10-CM | POA: Diagnosis not present

## 2014-11-20 MED ORDER — PHENAZOPYRIDINE HCL 200 MG PO TABS: 200.0000 mg | ORAL_TABLET | Freq: Three times a day (TID) | ORAL | Status: DC | PRN

## 2014-11-20 MED ORDER — ACETAMINOPHEN 325 MG PO TABS
ORAL_TABLET | ORAL | Status: AC
  Filled 2014-11-20: qty 2

## 2014-11-20 MED ORDER — HYDROCODONE-ACETAMINOPHEN 5-325 MG PO TABS
ORAL_TABLET | ORAL | Status: AC
  Filled 2014-11-20: qty 1

## 2014-11-20 MED ORDER — HYDROCODONE-ACETAMINOPHEN 7.5-325 MG PO TABS: 1.0000 | ORAL_TABLET | ORAL | Status: DC | PRN

## 2014-11-20 MED ORDER — PHENAZOPYRIDINE HCL 200 MG PO TABS
200.0000 mg | ORAL_TABLET | Freq: Once | ORAL | Status: AC
  Administered 2014-11-20: 200 mg via ORAL
  Filled 2014-11-20: qty 1

## 2014-11-20 MED ORDER — PHENAZOPYRIDINE HCL 100 MG PO TABS
ORAL_TABLET | ORAL | Status: AC
  Filled 2014-11-20: qty 2

## 2014-11-20 NOTE — Discharge Summary (Signed)
Physician Discharge Summary  Patient ID: Darryl [name] MRN: [id number] DOB/AGE: [birth year] 78 y.o.  Admit date: 11/19/2014 Discharge date: 11/20/2014  Admission Diagnoses: 1. BPH with urinary retention 2. Bladder calculus  Discharge Diagnoses:  Active Problems:   BPH (benign prostatic hypertrophy) with urinary retention  bladder calculus  Discharged Condition: good  Hospital Course: He was admitted for an elective transurethral resection of his prostate due to BPH with urinary retention that did not respond to maximum medical management. He underwent an uneventful transurethral resection of his prostate but was found to have a small bladder stone that was treated at that time as well. He was observed overnight and his urine cleared and catheter was removed. He is awaiting spontaneous voiding prior to discharge.   Discharge Exam: Blood pressure 175/77, pulse 59, temperature 98.1 F (36.7 C), temperature source Oral, resp. rate 16, height 5\' 8"  (1.727 m), weight 90.266 kg (199 lb), SpO2 98 %. General appearance: alert, cooperative and no distress  Abdomen soft and nontender.  Disposition: 01-Home or Self Care  Discharge Instructions    Discharge patient    Complete by:  As directed             Medication List    STOP taking these medications        finasteride 5 MG tablet  Commonly known as:  PROSCAR     tamsulosin 0.4 MG Caps capsule  Commonly known as:  FLOMAX      TAKE these medications        acetaminophen 500 MG tablet  Commonly known as:  TYLENOL  Take 1,000 mg by mouth every 8 (eight) hours as needed for mild pain or moderate pain.     aspirin 81 MG tablet  Take 81 mg by mouth every Monday, Wednesday, and Friday.     citalopram 20 MG tablet  Commonly known as:  CELEXA  Take 20 mg by mouth daily.     cloNIDine 0.2 MG tablet  Commonly known as:  CATAPRES  Take 0.2-0.3 mg by mouth 2 (two) times daily. Takes 1 tablet in am and 1.5 tablets in PM daily      FISH OIL PO  Take 800 mg by mouth daily. Carlson's Liquid     furosemide 20 MG tablet  Commonly known as:  LASIX  Take 1 tablet (20 mg total) by mouth daily.     HYDROcodone-acetaminophen 7.5-325 MG per tablet  Commonly known as:  NORCO  Take 1-2 tablets by mouth every 4 (four) hours as needed for moderate pain. Maximum dose per 24 hours - 8 pills     irbesartan 300 MG tablet  Commonly known as:  AVAPRO  Take 300 mg by mouth every morning.     metoprolol 50 MG tablet  Commonly known as:  LOPRESSOR  Take 50 mg by mouth 2 (two) times daily.     pantoprazole 40 MG tablet  Commonly known as:  PROTONIX  Take 40 mg by mouth every morning.     phenazopyridine 200 MG tablet  Commonly known as:  PYRIDIUM  Take 1 tablet (200 mg total) by mouth 3 (three) times daily as needed for pain.     phenazopyridine 200 MG tablet  Commonly known as:  PYRIDIUM  Take 1 tablet (200 mg total) by mouth 3 (three) times daily as needed for pain.     potassium chloride 10 MEQ tablet  Commonly known as:  K-DUR  Take 1 tablet (10 mEq total) by mouth  daily.     sulfamethoxazole-trimethoprim 400-80 MG per tablet  Commonly known as:  BACTRIM  Take 1 tablet by mouth 2 (two) times daily.     traMADol 50 MG tablet  Commonly known as:  ULTRAM  Take 1 tablet (50 mg total) by mouth every 6 (six) hours as needed.     traMADol 50 MG tablet  Commonly known as:  ULTRAM  Take 50 mg by mouth daily as needed (pain).           Follow-up Information    Follow up with Claybon Jabs, MD On 11/27/2014.   Specialty:  Urology   Why:  at 10:00   Contact information:   [address] [id number] [phone]       Signed: Claybon Jabs 11/20/2014, 7:11 AM

## 2014-11-20 NOTE — Progress Notes (Signed)
Inserted #18 foley catheter using sterile technique. 550 ml red colored urine noted with a few small blood clots. Tolerated procedure well. Instructed to discontinue catheter on Thursday am and call MD office for any problems.

## 2014-11-20 NOTE — Progress Notes (Signed)
Dr. Karsten Ro paged and called back. Reported that patient is unable to void, starting to get uncomfortable & bladder scan of 487 ml. See order.

## 2014-11-20 NOTE — Discharge Instructions (Signed)
Post transurethral resection of the prostate (TURP) instructions ° °Your recent prostate surgery requires very special post hospital care. Despite the fact that no skin incisions were used the area around the prostate incision is quite raw and is covered with a scab to promote healing and prevent bleeding. Certain cautions are needed to assure that the scab is not disturbed of the next 2-3 weeks while the healing proceeds. ° °Because the raw surface in your prostate and the irritating effects of urine you may expect frequency of urination and/or urgency (a stronger desire to urinate) and perhaps even getting up at night more often. This will usually resolve or improve slowly over the healing period. You may see some blood in your urine over the first 6 weeks. Do not be alarmed, even if the urine was clear for a while. Get off your feet and drink lots of fluids until clearing occurs. If you start to pass clots or don't improve call us. ° °Catheter: (If you are discharged with a catheter.) °1. Keep your catheter secured to your leg at all times with tape or the supplied strap. °2. You may experience leakage of urine around your catheter- as long as the  °catheter continues to drain, this is normal.  If your catheter stops draining  °go to the ER. °3. You may also have blood in your urine, even after it has been clear for  °several days; you may even pass some small blood clots or other material.  This  °is normal as well.  If this happens, sit down and drink plenty of water to help  °make urine to flush out your bladder.  If the blood in your urine becomes worse  °after doing this, contact our office or return to the ER. °4. You may use the leg bag (small bag) during the day, but use the large bag at  °night. ° °Diet: ° °You may return to your normal diet immediately. Because of the raw surface of your bladder, alcohol, spicy foods, foods high in acid and drinks with caffeine may cause irritation or frequency and  should be used in moderation. To keep your urine flowing freely and avoid constipation, drink plenty of fluids during the day (8-10 glasses). Tip: Avoid cranberry juice because it is very acidic. ° °Activity: ° °Your physical activity doesn't need to be restricted. However, if you are very active, you may see some blood in the urine. We suggest that you reduce your activity under the circumstances until the bleeding has stopped. ° °Bowels: ° °It is important to keep your bowels regular during the postoperative period. Straining with bowel movements can cause bleeding. A bowel movement every other day is reasonable. Use a mild laxative if needed, such as milk of magnesia 2-3 tablespoons, or 2 Dulcolax tablets. Call if you continue to have problems. If you had been taking narcotics for pain, before, during or after your surgery, you may be constipated. Take a laxative if necessary. ° °Medication: ° °You should resume your pre-surgery medications unless told not to. DO NOT RESUME YOUR ASPIRIN, WARFARIN, OR OTHER BLOOD THINNER FOR 1 WEEK. In addition you may be given an antibiotic to prevent or treat infection. Antibiotics are not always necessary. All medication should be taken as prescribed until the bottles are finished unless you are having an unusual reaction to one of the drugs. ° ° ° ° °Problems you should report to us: ° °a. Fever greater than 101°F. °b. Heavy bleeding, or clots (see   notes above about blood in urine). c. Inability to urinate. d. Drug reactions (hives, rash, nausea, vomiting, diarrhea). e. Severe burning or pain with urination that is not improving.  Post Anesthesia Home Care Instructions  Activity: Get plenty of rest for the remainder of the day. A responsible adult should stay with you for 24 hours following the procedure.  For the next 24 hours, DO NOT: -Drive a car -Paediatric nurse -Drink alcoholic beverages -Take any medication unless instructed by your physician -Make any  legal decisions or sign important papers.  Meals: Start with liquid foods such as gelatin or soup. Progress to regular foods as tolerated. Avoid greasy, spicy, heavy foods. If nausea and/or vomiting occur, drink only clear liquids until the nausea and/or vomiting subsides. Call your physician if vomiting continues.  Special Instructions/Symptoms: Your throat may feel dry or sore from the anesthesia or the breathing tube placed in your throat during surgery. If this causes discomfort, gargle with warm salt water. The discomfort should disappear within 24 hours.  If you had a scopolamine patch placed behind your ear for the management of post- operative nausea and/or vomiting:  1. The medication in the patch is effective for 72 hours, after which it should be removed.  Wrap patch in a tissue and discard in the trash. Wash hands thoroughly with soap and water. 2. You may remove the patch earlier than 72 hours if you experience unpleasant side effects which may include dry mouth, dizziness or visual disturbances. 3. Avoid touching the patch. Wash your hands with soap and water after contact with the patch.

## 2014-11-22 DIAGNOSIS — N401 Enlarged prostate with lower urinary tract symptoms: Secondary | ICD-10-CM | POA: Diagnosis not present

## 2014-11-22 DIAGNOSIS — N138 Other obstructive and reflux uropathy: Secondary | ICD-10-CM | POA: Diagnosis not present

## 2014-11-22 DIAGNOSIS — R339 Retention of urine, unspecified: Secondary | ICD-10-CM | POA: Diagnosis not present

## 2014-11-27 ENCOUNTER — Ambulatory Visit: Payer: Medicare Other | Admitting: Cardiology

## 2014-11-27 DIAGNOSIS — R339 Retention of urine, unspecified: Secondary | ICD-10-CM | POA: Diagnosis not present

## 2014-11-28 DIAGNOSIS — I2510 Atherosclerotic heart disease of native coronary artery without angina pectoris: Secondary | ICD-10-CM | POA: Insufficient documentation

## 2014-11-28 NOTE — Progress Notes (Signed)
Cardiology Office Note   Date:  11/29/2014   ID:  Darryl [name], DOB 1934/08/23, MRN [id number]  PCP:  Darryl Solo, MD    Chief Complaint  Patient presents with  . Follow-up    dilated aortic root, CAD, HTN      History of Present Illness: Darryl [name] is a 80 y.o. male with a history of CAD/HTN/chronic LBBB/dyslipidemia and diastolic dysfunction who presents today for followup of his HTN. He was recently in the hospital for urologic problems and was given IVF and developed acute diastolic CHF from volume overload.  He has not had any further problems since then.  He denies any chest pain, LE edema, dizziness, palpitations or syncope. He has some DOE and it is intermittent and does not occur daily. He says it does not occur often. He walks for exercise but is limited by back problems. He will walk about 15 minutes daily.     Past Medical History  Diagnosis Date  . LBBB (left bundle branch block)   . Sleep disturbance   . Insomnia   . Tremor   . HTN (hypertension)     orthostatic  . Dyslipidemia   . Thrombocytopenia   . Colon polyp   . Diverticulosis   . Renal insufficiency     renal duplex scan in 2012 showed no renal artery stenosis  . Lumbar spondylolysis     multilevel DJD with mild spinal stenosis L3-L5  . Headache(784.0)   . Arthritis   . Thrombocytopenia   . Elevated fasting glucose   . Gastritis   . Hiatal hernia   . Migraines   . H/O urinary retention     post surgical procedures; has foley cath  . Parkinson's disease   . Stroke     a. remote hx of stroke reportedly r/t HTN.  . Intracranial hemorrhage     a. small pontine [place] 2003.  Darryl [name] Peptic ulcer disease     a. prior hx of bleeding ulcers, including 12/2003 in setting of aspirin use.  . Gastritis     a. by EGD 04/2004.  Darryl [name] Hypertensive heart disease   . Dilated aortic root 05/29/2014  . UTI (urinary tract infection)   . CHF (congestive heart failure)     hx of  diastolic chf w last admission  . Coronary atherosclerosis of native coronary artery     a. s/p CABG 11/2003. b. s/p DES to radial-OM and SVG-RCA 11/05. c. s/p BMS to LCx 11/2008.  Darryl [name] HOH (hard of hearing)     slight hoh, rt>l  . Wears glasses   . BPH (benign prostatic hypertrophy) with urinary retention     Past Surgical History  Procedure Laterality Date  . Wisdom tooth extraction    . Esophagogastroduodenoscopy      10/2001, 12/2003, 04/2004  . Coronary artery bypass graft  2005  . Shoulder arthroscopy Left 9/10  . Appendectomy  60    1963  . Stones      hx  . Cholecystectomy  1998  . Lumbar laminectomy/decompression microdiscectomy N/A 05/03/2013    Procedure: LUMBAR LAMINECTOMY/DCOMPRESSION MICRODISCECTOMY RIGHT LUMBAR FOUR FIVE;  Surgeon: Darryl Hoops, MD;  Location: [facility];  Service: Neurosurgery;  Laterality: N/A;  . Colonoscopy  10/2001  . Colon surgery  03/14/2008  . Coronary angioplasty  6/10,10/05    Dr. Fransico [name]- cardiologist: last office visit 11/07/14  .  Transurethral resection of prostate N/A 11/19/2014    Procedure: TRANSURETHRAL RESECTION OF THE PROSTATE WITH GYRUS INSTRUMENTS;  Surgeon: Darryl Rhodes, MD;  Location: [facility];  Service: Urology;  Laterality: N/A;  . Cystoscopy with litholapaxy N/A 11/19/2014    Procedure: CYSTOSCOPY WITH LITHOLAPAXY;  Surgeon: Darryl Rhodes, MD;  Location: [facility];  Service: Urology;  Laterality: N/A;     Current Outpatient Prescriptions  Medication Sig Dispense Refill  . acetaminophen (TYLENOL) 500 MG tablet Take 1,000 mg by mouth every 8 (eight) hours as needed for mild pain or moderate pain.    Darryl [name] aspirin 81 MG tablet Take 81 mg by mouth every Monday, Wednesday, and Friday.     . citalopram (CELEXA) 20 MG tablet Take 20 mg by mouth daily.    . cloNIDine (CATAPRES) 0.2 MG tablet Take 0.2-0.3 mg by mouth 2 (two) times daily. Takes 1 tablet in am and 1.5 tablets in PM daily    . furosemide  (LASIX) 20 MG tablet Take 1 tablet (20 mg total) by mouth daily. 90 tablet 3  . HYDROcodone-acetaminophen (NORCO) 7.5-325 MG per tablet Take 1-2 tablets by mouth every 4 (four) hours as needed for moderate pain. Maximum dose per 24 hours - 8 pills 20 tablet 0  . irbesartan (AVAPRO) 300 MG tablet Take 300 mg by mouth every morning.    . metoprolol (LOPRESSOR) 50 MG tablet Take 50 mg by mouth 2 (two) times daily.    . Omega-3 Fatty Acids (FISH OIL PO) Take 800 mg by mouth daily. Carlson's Liquid    . pantoprazole (PROTONIX) 40 MG tablet Take 40 mg by mouth every morning.    . potassium chloride (K-DUR) 10 MEQ tablet Take 1 tablet (10 mEq total) by mouth daily. 30 tablet 1  . tamsulosin (FLOMAX) 0.4 MG CAPS capsule Take 0.4 mg by mouth daily.    . traMADol (ULTRAM) 50 MG tablet Take 50 mg by mouth daily as needed (pain).      No current facility-administered medications for this visit.    Allergies:   Ambien; Aspirin; Celebrex; Nsaids; Toprol xl; Altace; Amlodipine; Levothyroxine; Biaxin; and Tetracyclines & related    Social History:  The patient  reports that he quit smoking about 55 years ago. His smoking use included Cigarettes. He has a 10 pack-year smoking history. He has never used smokeless tobacco. He reports that he does not drink alcohol or use illicit drugs.   Family History:  The patient's family history includes CAD in his sister; CAD (age of onset: 90) in his father.    ROS:  Please see the history of present illness.   Otherwise, review of systems are positive for none.   All other systems are reviewed and negative.    PHYSICAL EXAM: VS:  BP 120/72 mmHg  Pulse 55  Ht 5\' 8"  (1.727 m)  Wt 198 lb 6.4 oz (89.994 kg)  BMI 30.17 kg/m2  SpO2 95% , BMI Body mass index is 30.17 kg/(m^2). GEN: Well nourished, well developed, in no acute distress HEENT: normal Neck: no JVD, carotid bruits, or masses Cardiac: RRR; no murmurs, rubs, or gallops,no edema  Respiratory:  clear to  auscultation bilaterally, normal work of breathing GI: soft, nontender, nondistended, + BS MS: no deformity or atrophy Skin: warm and dry, no rash Neuro:  Strength and sensation are intact Psych: euthymic mood, full affect   EKG:  EKG is not ordered today.    Recent Labs: 10/09/2014: ALT 28 10/10/2014:  B Natriuretic Peptide 878.5* 10/12/2014: Platelets 185 11/19/2014: BUN 21*; Creatinine 1.40*; Hemoglobin 13.6; Potassium 3.9; Sodium 140    Lipid Panel    Component Value Date/Time   CHOL 160 04/25/2014 0750   TRIG 371.0* 04/25/2014 0750   HDL 28.70* 04/25/2014 0750   CHOLHDL 6 04/25/2014 0750   VLDL 74.2* 04/25/2014 0750   LDLDIRECT 45.7 04/25/2014 0750      Wt Readings from Last 3 Encounters:  11/29/14 198 lb 6.4 oz (89.994 kg)  11/07/14 201 lb 12.8 oz (91.536 kg)  10/25/14 199 lb 12.8 oz (90.629 kg)     ASSESSMENT AND PLAN:  1. ASCAD s/p remote CABG and subsequent PCI - continue ASA  2. Chronic LBBB 3. Dyslipidemia - LDL was 45 on labs 03/2014. TAG were elevated - continue fish oil (statin intolerant)  - recheck FLP and ALT  4. HTN - well controlled - continue Clonidine/Avapro/Lisinopril/Metoprolol/Hytrin   5. Mildly dilated aortic root - recheck echo 04/2015       6.  Diastolic dysfunction with recent acute diastolic CHF  Due to aggressive fluid hydration- appears euvolemic on exam   Current medicines are reviewed at length with the patient today.  The patient does not have concerns regarding medicines.  The following changes have been made:  no change  Labs/ tests ordered today: See above Assessment and Plan No orders of the defined types were placed in this encounter.     Disposition:   FU with me in 1 year  Signed, Sueanne Margarita, MD  11/29/2014 11:27 AM    [facility] [place], [place], [place]  [address] Phone: [phone]; Fax: [phone]

## 2014-11-29 ENCOUNTER — Encounter: Payer: Self-pay | Admitting: Cardiology

## 2014-11-29 ENCOUNTER — Ambulatory Visit (INDEPENDENT_AMBULATORY_CARE_PROVIDER_SITE_OTHER): Payer: Medicare Other | Admitting: Cardiology

## 2014-11-29 VITALS — BP 120/72 | HR 55 | Ht 68.0 in | Wt 198.4 lb

## 2014-11-29 DIAGNOSIS — I509 Heart failure, unspecified: Secondary | ICD-10-CM

## 2014-11-29 DIAGNOSIS — E785 Hyperlipidemia, unspecified: Secondary | ICD-10-CM

## 2014-11-29 DIAGNOSIS — I2510 Atherosclerotic heart disease of native coronary artery without angina pectoris: Secondary | ICD-10-CM

## 2014-11-29 DIAGNOSIS — I447 Left bundle-branch block, unspecified: Secondary | ICD-10-CM

## 2014-11-29 DIAGNOSIS — I77810 Thoracic aortic ectasia: Secondary | ICD-10-CM | POA: Diagnosis not present

## 2014-11-29 DIAGNOSIS — I110 Hypertensive heart disease with heart failure: Secondary | ICD-10-CM

## 2014-11-29 NOTE — Patient Instructions (Addendum)
Medication Instructions:  Your physician recommends that you continue on your current medications as directed. Please refer to the Current Medication list given to you today.   Labwork: Your physician recommends that you return for FASTING lab work within the next week (BMET, Lipids, LFTs).  Testing/Procedures: Your physician has requested that you have an echocardiogram in November, 2016. Echocardiography is a painless test that uses sound waves to create images of your heart. It provides your doctor with information about the size and shape of your heart and how well your heart's chambers and valves are working. This procedure takes approximately one hour. There are no restrictions for this procedure.  Follow-Up: Your physician wants you to follow-up in: 1 year with Dr. Radford Pax. You will receive a reminder letter in the mail two months in advance. If you don't receive a letter, please call our office to schedule the follow-up appointment.   Any Other Special Instructions Will Be Listed Below (If Applicable).

## 2014-12-06 ENCOUNTER — Other Ambulatory Visit (INDEPENDENT_AMBULATORY_CARE_PROVIDER_SITE_OTHER): Payer: Medicare Other | Admitting: *Deleted

## 2014-12-06 DIAGNOSIS — I509 Heart failure, unspecified: Secondary | ICD-10-CM | POA: Diagnosis not present

## 2014-12-06 DIAGNOSIS — E785 Hyperlipidemia, unspecified: Secondary | ICD-10-CM

## 2014-12-06 DIAGNOSIS — I110 Hypertensive heart disease with heart failure: Secondary | ICD-10-CM

## 2014-12-06 LAB — LIPID PANEL
Cholesterol: 147 mg/dL (ref 0–200)
HDL: 29.4 mg/dL — AB (ref >39.00)
NONHDL: 117.6
TRIGLYCERIDES: 201 mg/dL — AB (ref 0.0–149.0)
Total CHOL/HDL Ratio: 5
VLDL: 40.2 mg/dL — AB (ref 0.0–40.0)

## 2014-12-06 LAB — LDL CHOLESTEROL, DIRECT: Direct LDL: 42 mg/dL

## 2014-12-06 LAB — HEPATIC FUNCTION PANEL
ALK PHOS: 106 U/L (ref 39–117)
ALT: 22 U/L (ref 0–53)
AST: 27 U/L (ref 0–37)
Albumin: 4 g/dL (ref 3.5–5.2)
BILIRUBIN TOTAL: 0.6 mg/dL (ref 0.2–1.2)
Bilirubin, Direct: 0.1 mg/dL (ref 0.0–0.3)
Total Protein: 7 g/dL (ref 6.0–8.3)

## 2014-12-06 LAB — BASIC METABOLIC PANEL
BUN: 27 mg/dL — AB (ref 6–23)
CO2: 29 mEq/L (ref 19–32)
Calcium: 9.6 mg/dL (ref 8.4–10.5)
Chloride: 104 mEq/L (ref 96–112)
Creatinine, Ser: 1.46 mg/dL (ref 0.40–1.50)
GFR: 49.69 mL/min — AB (ref >60.00)
GLUCOSE: 109 mg/dL — AB (ref 70–99)
POTASSIUM: 4 meq/L (ref 3.5–5.1)
Sodium: 137 mEq/L (ref 135–145)

## 2014-12-06 NOTE — Addendum Note (Signed)
Addended by: Eulis Foster on: 12/06/2014 07:57 AM   Modules accepted: Orders

## 2014-12-13 ENCOUNTER — Ambulatory Visit: Payer: Medicare Other | Attending: Internal Medicine | Admitting: Physical Therapy

## 2014-12-13 ENCOUNTER — Encounter: Payer: Self-pay | Admitting: Physical Therapy

## 2014-12-13 DIAGNOSIS — R279 Unspecified lack of coordination: Secondary | ICD-10-CM | POA: Diagnosis not present

## 2014-12-13 DIAGNOSIS — R29818 Other symptoms and signs involving the nervous system: Secondary | ICD-10-CM | POA: Insufficient documentation

## 2014-12-13 DIAGNOSIS — R2689 Other abnormalities of gait and mobility: Secondary | ICD-10-CM

## 2014-12-13 DIAGNOSIS — R278 Other lack of coordination: Secondary | ICD-10-CM

## 2014-12-13 DIAGNOSIS — R269 Unspecified abnormalities of gait and mobility: Secondary | ICD-10-CM | POA: Insufficient documentation

## 2014-12-13 NOTE — Therapy (Signed)
[facility] [address] [place], [place], [address] Phone: [phone]   Fax:  [phone]  Physical Therapy Evaluation  Patient Details  Name: Darryl [name] MRN: [id number] Date of Birth: 07/17/1934 Referring Provider:  Geradine Girt, DO  Encounter Date: 12/13/2014      PT End of Session - 12/13/14 1225    Visit Number 1  G1   Number of Visits 9   Date for PT Re-Evaluation 01/12/15   Authorization Type Medicare   Authorization Time Period 12-13-14  - 02-11-15   Authorization - Visit Number 1   PT Start Time 0800   PT Stop Time 0845   PT Time Calculation (min) 45 min      Past Medical History  Diagnosis Date  . LBBB (left bundle branch block)   . Sleep disturbance   . Insomnia   . Tremor   . HTN (hypertension)     orthostatic  . Dyslipidemia   . Thrombocytopenia   . Colon polyp   . Diverticulosis   . Renal insufficiency     renal duplex scan in 2012 showed no renal artery stenosis  . Lumbar spondylolysis     multilevel DJD with mild spinal stenosis L3-L5  . Headache(784.0)   . Arthritis   . Thrombocytopenia   . Elevated fasting glucose   . Gastritis   . Hiatal hernia   . Migraines   . H/O urinary retention     post surgical procedures; has foley cath  . Parkinson's disease   . Stroke     a. remote hx of stroke reportedly r/t HTN.  . Intracranial hemorrhage     a. small pontine [name] 2003.  [name] Peptic ulcer disease     a. prior hx of bleeding ulcers, including 12/2003 in setting of aspirin use.  . Gastritis     a. by EGD 04/2004.  [name] Hypertensive heart disease   . Dilated aortic root 05/29/2014  . UTI (urinary tract infection)   . CHF (congestive heart failure)     hx of diastolic chf w last admission  . Coronary atherosclerosis of native coronary artery     a. s/p CABG 11/2003. b. s/p DES to radial-OM and SVG-RCA 11/05. c. s/p BMS to LCx 11/2008.  [name] HOH (hard of hearing)     slight hoh,  rt>l  . Wears glasses   . BPH (benign prostatic hypertrophy) with urinary retention     Past Surgical History  Procedure Laterality Date  . Wisdom tooth extraction    . Esophagogastroduodenoscopy      10/2001, 12/2003, 04/2004  . Coronary artery bypass graft  2005  . Shoulder arthroscopy Left 9/10  . Appendectomy  60    1963  . Stones      hx  . Cholecystectomy  1998  . Lumbar laminectomy/decompression microdiscectomy N/A 05/03/2013    Procedure: LUMBAR LAMINECTOMY/DCOMPRESSION MICRODISCECTOMY RIGHT LUMBAR FOUR FIVE;  Surgeon: [name], MD;  Location: [facility];  Service: Neurosurgery;  Laterality: N/A;  . Colonoscopy  10/2001  . Colon surgery  03/14/2008  . Coronary angioplasty  6/10,10/05    Dr. [name] cardiologist: last office visit 11/07/14  . Transurethral resection of prostate N/A 11/19/2014    Procedure: TRANSURETHRAL RESECTION OF THE PROSTATE WITH GYRUS INSTRUMENTS;  Surgeon: [name], MD;  Location: [facility];  Service: Urology;  Laterality: N/A;  . Cystoscopy with litholapaxy N/A 11/19/2014    Procedure: CYSTOSCOPY WITH  LITHOLAPAXY;  Surgeon: [name], MD;  Location: [facility];  Service: Urology;  Laterality: N/A;    There were no vitals filed for this visit.  Visit Diagnosis:  Abnormality of gait - Plan: PT plan of care cert/re-cert  Balance problems - Plan: PT plan of care cert/re-cert  Decreased coordination - Plan: PT plan of care cert/re-cert      Subjective Assessment - 12/13/14 1210    Subjective Pt. was hospitalized on 09-30-14 with urinary retention - foley catheter was placed and pt. was discharged home; pt. was readmitted to hospital on 4-6-16with UTI, sepsis, and a recent fall; pt underwent surgery for prostrate in May 2016; Pt. reports main problem is weakness in his legs and shuffling gait due to Parknonson's; diagnosed with Parkinson's disease by Dr. Jaynee [name] in Jan 2015                                                                                                        Patient is accompained by: Family member   Pertinent History diagnosed with Parkinson's disease in Jan 2015   Patient Stated Goals Improve leg strength and walking   Currently in Pain? No/denies            [facility] PT Assessment - 12/13/14 0813    Assessment   Medical Diagnosis Parkinsons   Onset Date/Surgical Date 09/30/14  diagnosed initially in Jan 2015   Next MD Visit --  in 6 months   Prior Therapy None   Balance Screen   Has the patient fallen in the past 6 months Yes   How many times? 1   Has the patient had a decrease in activity level because of a fear of falling?  No   Is the patient reluctant to leave their home because of a fear of falling?  No   Home Environment   Living Environment Private residence   Type of [place] Access Stairs to enter   Entrance Stairs-Number of Steps 2   Entrance Stairs-Rails Can reach both   [place] One level   [place] - 2 wheels   Prior Function   Level of Independence Independent with community mobility without device   Leisure golf and walking -   used to walk 4 miles/day (5 yrs ago for both walking & golf)   Posture/Postural Control   Posture/Postural Control Postural limitations   Postural Limitations Forward head;Rounded Shoulders;Increased thoracic kyphosis   Posture Comments forward head with incr. trunk flexion   Ambulation/Gait   Ambulation/Gait Yes   Ambulation/Gait Assistance 6: Modified independent (Device/Increase time)   Ambulation Distance (Feet) 150 Feet   Assistive device --  none   Gait Pattern Decreased step length - left;Decreased stance time - right;Decreased stance time - left;Decreased stride length;Decreased step length - right  decr. initial heel contact   Ambulation Surface Level;Indoor   Gait velocity 3.12  no device   Berg Balance Test   Sit to Stand Able to stand without using hands and stabilize independently    Standing  Unsupported Able to stand safely 2 minutes   Sitting with Back Unsupported but Feet Supported on Floor or Stool Able to sit safely and securely 2 minutes   Stand to Sit Sits safely with minimal use of hands   Transfers Able to transfer safely, minor use of hands   Standing Unsupported with Eyes Closed Able to stand 10 seconds safely   Standing Ubsupported with Feet Together Able to place feet together independently and stand 1 minute safely   From Standing, Reach Forward with Outstretched Arm Can reach forward >12 cm safely (5")  6"   From Standing Position, Pick up Object from Floor Able to pick up shoe safely and easily   From Standing Position, Turn to Look Behind Over each Shoulder Looks behind from both sides and weight shifts well   Turn 360 Degrees Able to turn 360 degrees safely but slowly  R=6.85  L=5.75   Standing Unsupported, Alternately Place Feet on Step/Stool Able to complete >2 steps/needs minimal assist   Standing Unsupported, One Foot in Front Able to place foot tandem independently and hold 30 seconds   Standing on One Leg Tries to lift leg/unable to hold 3 seconds but remains standing independently  R= 2.03  L=1.22   Total Score 47   Timed Up and Go Test   Normal TUG (seconds) 11.35     Self care; Instructed in seated bil. Hamstring stretches x 30 sec hold - wife present and verbalized understanding                      PT Education - 12/13/14 1224    Education provided Yes   Education Details instructed in bilateral hamstring stretching in seated position   Person(s) Educated Patient;Spouse   Methods Explanation;Demonstration   Comprehension Verbalized understanding;Returned demonstration             PT Long Term Goals - 12/13/14 1640    PT LONG TERM GOAL #1   Title Incr. Berg balance test score to >/= 51/56 to decr. fall risk  (01-12-15)   Baseline 47/56   Time 4   Period Weeks   Status New   PT LONG TERM GOAL #2   Title  Incr. gait velocity to >/= 3.6 ft/sec without device for incr. gait efficiency  (01-12-15)   Time 4   Period Weeks   Status New   PT LONG TERM GOAL #3   Title Complete dynamic gait index/functional gait assessment and establish goal as appropriate.   Time 4   Period Weeks   Status New   PT LONG TERM GOAL #4   Title Verbalize understanding of community exercise classess for Parkinsons' patients   Time 4   Period Weeks   PT LONG TERM GOAL #5   Title Independent in HEP for balance/BIG exercises   Time 4   Period Weeks   Status New   Additional Long Term Goals   Additional Long Term Goals Yes   PT LONG TERM GOAL #6   Title Incr. FOTO score by at least 10 points for improved pt satisfaction   Baseline completed on 12-13-14   Time 4   Period Weeks   Status New               Plan - 12/13/14 1226    Clinical Impression Statement Pt. presents with balance, gait and postural deviations due to Parkinson's disease; pt has dynamic standing balance deficits with static standing balance good with exception  of high level blance skills; pt. would benefit from BIG exercises to address gait deviations and to fascilitate large movement patterns and to improve balance   Pt will benefit from skilled therapeutic intervention in order to improve on the following deficits Abnormal gait;Decreased balance;Decreased mobility;Decreased strength;Decreased coordination;Decreased endurance;Impaired flexibility   Rehab Potential Good   PT Frequency 2x / week   PT Duration 4 weeks   PT Treatment/Interventions ADLs/Self Care Home Management;DME Instruction;Gait training;Stair training;Functional mobility training;Patient/family education;Neuromuscular re-education;Balance training;Therapeutic exercise;Therapeutic activities   PT Next Visit Plan Dynamic gait index or functional gait assessment; begin HEP   PT Home Exercise Plan balance and BIG exercises   Consulted and Agree with Plan of Care Patient           G-Codes - 2014-12-15 1634    Functional Assessment Tool Used Berg score 47/56; TUG score 11.35 secs:  gait velocity 3.12 ft/sec without device   Functional Limitation Mobility: Walking and moving around   Mobility: Walking and Moving Around Current Status [phone]) At least 20 percent but less than 40 percent impaired, limited or restricted   Mobility: Walking and Moving Around Goal Status [phone]) At least 1 percent but less than 20 percent impaired, limited or restricted       Problem List Patient Active Problem List   Diagnosis Date Noted  . CAD (coronary artery disease), native coronary artery 11/28/2014  . BPH (benign prostatic hypertrophy) with urinary retention 11/19/2014  . Diastolic dysfunction 61/44/3154  . UTI (urinary tract infection) 10/07/2014  . Abnormal EKG 10/04/2014  . Urinary retention 10/04/2014  . BPH (benign prostatic hypertrophy) 10/04/2014  . UTI (lower urinary tract infection) 10/03/2014  . Sepsis 10/03/2014  . Parkinsonism 06/12/2014  . Dilated aortic root 05/29/2014  . CKD (chronic kidney disease), stage III 04/25/2014  . Gastritis   . Peptic ulcer disease   . Stroke   . Hx of CABG 2005, PCI 2010   . Parkinson's disease   . Small vessel disease, cerebrovascular 10/30/2013  . Intracerebral hemorrhage 10/30/2013  . Tremor 08/14/2013  . Hypertensive cardiovascular disease 04/13/2013  . Dyslipidemia 04/13/2013  . LBBB (left bundle branch block) 04/13/2013  . Preoperative clearance 04/13/2013  . DJD (degenerative joint disease) of lumbar spine 04/13/2013    Alda Lea, PT 2014/12/15, 4:44 PM  [facility] [address] [place], [place], [address] Phone: [phone]   Fax:  [phone]

## 2014-12-24 ENCOUNTER — Other Ambulatory Visit: Payer: Self-pay

## 2014-12-25 ENCOUNTER — Ambulatory Visit: Payer: Medicare Other | Admitting: Physical Therapy

## 2014-12-25 DIAGNOSIS — R29818 Other symptoms and signs involving the nervous system: Secondary | ICD-10-CM | POA: Diagnosis not present

## 2014-12-25 DIAGNOSIS — R279 Unspecified lack of coordination: Secondary | ICD-10-CM | POA: Diagnosis not present

## 2014-12-25 DIAGNOSIS — R278 Other lack of coordination: Secondary | ICD-10-CM

## 2014-12-25 DIAGNOSIS — R269 Unspecified abnormalities of gait and mobility: Secondary | ICD-10-CM | POA: Diagnosis not present

## 2014-12-25 NOTE — Therapy (Signed)
[facility] [address] [place], [place], [address] Phone: [phone]   Fax:  [phone]  Physical Therapy Treatment  Patient Details  Name: Darryl [name] MRN: [id number] Date of Birth: [birth year] Referring Provider:  [name], MD  Encounter Date: 12/25/2014      PT End of Session - 12/25/14 1435    Visit Number 2   Number of Visits 9   Date for PT Re-Evaluation 01/12/15   Authorization Type Medicare   Authorization Time Period 12-13-14  - 02-11-15   PT Start Time 1105   PT Stop Time 1144   PT Time Calculation (min) 39 min   Activity Tolerance Patient tolerated treatment well   Behavior During Therapy [facility] for tasks assessed/performed      Past Medical History  Diagnosis Date  . LBBB (left bundle branch block)   . Sleep disturbance   . Insomnia   . Tremor   . HTN (hypertension)     orthostatic  . Dyslipidemia   . Thrombocytopenia   . Colon polyp   . Diverticulosis   . Renal insufficiency     renal duplex scan in 2012 showed no renal artery stenosis  . Lumbar spondylolysis     multilevel DJD with mild spinal stenosis L3-L5  . Headache(784.0)   . Arthritis   . Thrombocytopenia   . Elevated fasting glucose   . Gastritis   . Hiatal hernia   . Migraines   . H/O urinary retention     post surgical procedures; has foley cath  . Parkinson's disease   . Stroke     a. remote hx of stroke reportedly r/t HTN.  . Intracranial hemorrhage     a. small pontine [place] 2003.  [name] Peptic ulcer disease     a. prior hx of bleeding ulcers, including 12/2003 in setting of aspirin use.  . Gastritis     a. by EGD 04/2004.  [name] Hypertensive heart disease   . Dilated aortic root 05/29/2014  . UTI (urinary tract infection)   . CHF (congestive heart failure)     hx of diastolic chf w last admission  . Coronary atherosclerosis of native coronary artery     a. s/p CABG 11/2003. b. s/p DES to radial-OM and SVG-RCA 11/05.  c. s/p BMS to LCx 11/2008.  [name] HOH (hard of hearing)     slight hoh, rt>l  . Wears glasses   . BPH (benign prostatic hypertrophy) with urinary retention     Past Surgical History  Procedure Laterality Date  . Wisdom tooth extraction    . Esophagogastroduodenoscopy      10/2001, 12/2003, 04/2004  . Coronary artery bypass graft  2005  . Shoulder arthroscopy Left 9/10  . Appendectomy  62    1963  . Stones      hx  . Cholecystectomy  1998  . Lumbar laminectomy/decompression microdiscectomy N/A 05/03/2013    Procedure: LUMBAR LAMINECTOMY/DCOMPRESSION MICRODISCECTOMY RIGHT LUMBAR FOUR FIVE;  Surgeon: [name], MD;  Location: [facility];  Service: Neurosurgery;  Laterality: N/A;  . Colonoscopy  10/2001  . Colon surgery  03/14/2008  . Coronary angioplasty  6/10,10/05    Dr. [name] cardiologist: last office visit 11/07/14  . Transurethral resection of prostate N/A 11/19/2014    Procedure: TRANSURETHRAL RESECTION OF THE PROSTATE WITH GYRUS INSTRUMENTS;  Surgeon: [name], MD;  Location: [facility];  Service: Urology;  Laterality: N/A;  . Cystoscopy with litholapaxy N/A  11/19/2014    Procedure: CYSTOSCOPY WITH LITHOLAPAXY;  Surgeon: [name], MD;  Location: [facility];  Service: Urology;  Laterality: N/A;    There were no vitals filed for this visit.  Visit Diagnosis:  Abnormality of gait  Decreased coordination      Subjective Assessment - 12/25/14 1107    Subjective No changes since eval; no pain today.   Currently in Pain? No/denies            [facility] PT Assessment - 12/25/14 1109    Functional Gait  Assessment   Gait assessed  Yes   Gait Level Surface Walks 20 ft in less than 7 sec but greater than 5.5 sec, uses assistive device, slower speed, mild gait deviations, or deviates 6-10 in outside of the 12 in walkway width.   Change in Gait Speed Able to change speed, demonstrates mild gait deviations, deviates 6-10 in outside of  the 12 in walkway width, or no gait deviations, unable to achieve a major change in velocity, or uses a change in velocity, or uses an assistive device.   Gait with Horizontal Head Turns Performs head turns with moderate changes in gait velocity, slows down, deviates 10-15 in outside 12 in walkway width but recovers, can continue to walk.   Gait with Vertical Head Turns Performs task with moderate change in gait velocity, slows down, deviates 10-15 in outside 12 in walkway width but recovers, can continue to walk.   Gait and Pivot Turn Turns slowly, requires verbal cueing, or requires several small steps to catch balance following turn and stop   Step Over Obstacle Is able to step over one shoe box (4.5 in total height) but must slow down and adjust steps to clear box safely. May require verbal cueing.   Gait with Narrow Base of Support Ambulates less than 4 steps heel to toe or cannot perform without assistance.   Gait with Eyes Closed Walks 20 ft, slow speed, abnormal gait pattern, evidence for imbalance, deviates 10-15 in outside 12 in walkway width. Requires more than 9 sec to ambulate 20 ft.   Ambulating Backwards Walks 20 ft, slow speed, abnormal gait pattern, evidence for imbalance, deviates 10-15 in outside 12 in walkway width.  16.10 seconds in 20 ft   Steps Alternating feet, must use rail.   Total Score 12     Score <19/30 indicate increased fall risk.                   [facility]) - 12/25/14 1124    PWR! exercises Moves in sitting   PWR! Up 12   PWR! Rock 10   PWR! Twist 10  Pt has difficulty sequencing trunk rotation   PWR! Step 10   Comments Visual and verbal cues for technique and intensity             PT Education - 12/25/14 1435    Education provided Yes   Education Details PWR! Moves in sitting   Person(s) Educated Patient   Methods Explanation;Demonstration;Handout   Comprehension Verbalized understanding;Returned demonstration;Need further  instruction             PT Long Term Goals - 12/25/14 1438    PT LONG TERM GOAL #1   Title Incr. Berg balance test score to >/= 51/56 to decr. fall risk  (01-12-15)   Baseline 47/56 at eval   Time 4   Period Weeks   Status New   PT LONG TERM GOAL #2  Title Incr. gait velocity to >/= 3.6 ft/sec without device for incr. gait efficiency  (01-12-15)   Time 4   Period Weeks   Status New   PT LONG TERM GOAL #3   Title Functional Gait Assessment score increased to at least 17/30 for decreased fall risk   Baseline FGA score 12/30 on 12/25/14   Time 4   Period Weeks   Status New   PT LONG TERM GOAL #4   Title Verbalize understanding of community exercise classess for Parkinsons' patients   Time 4   Period Weeks   Status New   PT LONG TERM GOAL #5   Title Independent in HEP for balance/BIG exercises   Time 4   Period Weeks   Status New   PT LONG TERM GOAL #6   Title Incr. FOTO score by at least 10 points for improved pt satisfaction   Baseline completed on 12-13-14   Time 4   Period Weeks   Status New               Plan - 12/25/14 1436    Clinical Impression Statement Pt presents as fall risk per Functional Gait Assessment score of 12/30.  Pt has slowed overall gait with dynamic challenges to gait.  Pt noted to have increased bilateral lower extremity tremor during challenges of Functional Gait Assessment. HEP initiated today, with pt requiring verbal and visual cues for increased intensity of movement.  Pt will benefit from further skilled PT to address posture, flexibility, functional strength, balance and gait.   Pt will benefit from skilled therapeutic intervention in order to improve on the following deficits Abnormal gait;Decreased balance;Decreased mobility;Decreased strength;Decreased coordination;Decreased endurance;Impaired flexibility   Rehab Potential Good   PT Frequency 2x / week   PT Duration 4 weeks  wk 1 of 4   PT Treatment/Interventions ADLs/Self Care  Home Management;DME Instruction;Gait training;Stair training;Functional mobility training;Patient/family education;Neuromuscular re-education;Balance training;Therapeutic exercise;Therapeutic activities   PT Next Visit Plan Review seated PWR! Moves; add standing PWR! Moves, seated SciFit for intensity   Consulted and Agree with Plan of Care Patient        Problem List Patient Active Problem List   Diagnosis Date Noted  . CAD (coronary artery disease), native coronary artery 11/28/2014  . BPH (benign prostatic hypertrophy) with urinary retention 11/19/2014  . Diastolic dysfunction 14/78/2956  . UTI (urinary tract infection) 10/07/2014  . Abnormal EKG 10/04/2014  . Urinary retention 10/04/2014  . BPH (benign prostatic hypertrophy) 10/04/2014  . UTI (lower urinary tract infection) 10/03/2014  . Sepsis 10/03/2014  . Parkinsonism 06/12/2014  . Dilated aortic root 05/29/2014  . CKD (chronic kidney disease), stage III 04/25/2014  . Gastritis   . Peptic ulcer disease   . Stroke   . Hx of CABG 2005, PCI 2010   . Parkinson's disease   . Small vessel disease, cerebrovascular 10/30/2013  . Intracerebral hemorrhage 10/30/2013  . Tremor 08/14/2013  . Hypertensive cardiovascular disease 04/13/2013  . Dyslipidemia 04/13/2013  . LBBB (left bundle branch block) 04/13/2013  . Preoperative clearance 04/13/2013  . DJD (degenerative joint disease) of lumbar spine 04/13/2013    MARRIOTT,AMY W. 12/25/2014, 2:41 PM  Frazier Butt., PT  [facility] [address] [place], [place], [address] Phone: [phone]   Fax:  [phone]

## 2014-12-25 NOTE — Patient Instructions (Signed)
Provided handout for patient on PWR! Moves:  PWR! Up x 10, PWR! Rock x 10, then [employer]! Step x 10 in sitting -To be performed 20 reps, once per day -Large amplitude, high intensity movement

## 2014-12-26 ENCOUNTER — Ambulatory Visit: Payer: Medicare Other | Admitting: Physical Therapy

## 2014-12-26 DIAGNOSIS — R29818 Other symptoms and signs involving the nervous system: Secondary | ICD-10-CM | POA: Diagnosis not present

## 2014-12-26 DIAGNOSIS — R269 Unspecified abnormalities of gait and mobility: Secondary | ICD-10-CM | POA: Diagnosis not present

## 2014-12-26 DIAGNOSIS — R279 Unspecified lack of coordination: Secondary | ICD-10-CM | POA: Diagnosis not present

## 2014-12-26 DIAGNOSIS — R278 Other lack of coordination: Secondary | ICD-10-CM

## 2014-12-26 NOTE — Therapy (Signed)
[facility] [address] [place], [place], [address] Phone: [phone]   Fax:  [phone]  Physical Therapy Treatment  Patient Details  Name: Darryl [name] MRN: [id number] Date of Birth: December 17, 1934 Referring Provider:  [name], MD  Encounter Date: 12/26/2014      PT End of Session - 12/26/14 1046    Visit Number 3   Number of Visits 9   Date for PT Re-Evaluation 01/12/15   Authorization Type Medicare   Authorization Time Period 12-13-14  - 02-11-15   PT Start Time 0851   PT Stop Time 0929   PT Time Calculation (min) 38 min   Activity Tolerance Patient tolerated treatment well   Behavior During Therapy [facility] for tasks assessed/performed      Past Medical History  Diagnosis Date  . LBBB (left bundle branch block)   . Sleep disturbance   . Insomnia   . Tremor   . HTN (hypertension)     orthostatic  . Dyslipidemia   . Thrombocytopenia   . Colon polyp   . Diverticulosis   . Renal insufficiency     renal duplex scan in 2012 showed no renal artery stenosis  . Lumbar spondylolysis     multilevel DJD with mild spinal stenosis L3-L5  . Headache(784.0)   . Arthritis   . Thrombocytopenia   . Elevated fasting glucose   . Gastritis   . Hiatal hernia   . Migraines   . H/O urinary retention     post surgical procedures; has foley cath  . Parkinson's disease   . Stroke     a. remote hx of stroke reportedly r/t HTN.  . Intracranial hemorrhage     a. small pontine [place] 2003.  [name] Peptic ulcer disease     a. prior hx of bleeding ulcers, including 12/2003 in setting of aspirin use.  . Gastritis     a. by EGD 04/2004.  [name] Hypertensive heart disease   . Dilated aortic root 05/29/2014  . UTI (urinary tract infection)   . CHF (congestive heart failure)     hx of diastolic chf w last admission  . Coronary atherosclerosis of native coronary artery     a. s/p CABG 11/2003. b. s/p DES to radial-OM and SVG-RCA 11/05.  c. s/p BMS to LCx 11/2008.  [name] HOH (hard of hearing)     slight hoh, rt>l  . Wears glasses   . BPH (benign prostatic hypertrophy) with urinary retention     Past Surgical History  Procedure Laterality Date  . Wisdom tooth extraction    . Esophagogastroduodenoscopy      10/2001, 12/2003, 04/2004  . Coronary artery bypass graft  2005  . Shoulder arthroscopy Left 9/10  . Appendectomy  62    1963  . Stones      hx  . Cholecystectomy  1998  . Lumbar laminectomy/decompression microdiscectomy N/A 05/03/2013    Procedure: LUMBAR LAMINECTOMY/DCOMPRESSION MICRODISCECTOMY RIGHT LUMBAR FOUR FIVE;  Surgeon: [name], MD;  Location: [facility];  Service: Neurosurgery;  Laterality: N/A;  . Colonoscopy  10/2001  . Colon surgery  03/14/2008  . Coronary angioplasty  6/10,10/05    Dr. [name] cardiologist: last office visit 11/07/14  . Transurethral resection of prostate N/A 11/19/2014    Procedure: TRANSURETHRAL RESECTION OF THE PROSTATE WITH GYRUS INSTRUMENTS;  Surgeon: [name], MD;  Location: [facility];  Service: Urology;  Laterality: N/A;  . Cystoscopy with litholapaxy N/A  11/19/2014    Procedure: CYSTOSCOPY WITH LITHOLAPAXY;  Surgeon: [name], MD;  Location: [facility];  Service: Urology;  Laterality: N/A;    There were no vitals filed for this visit.  Visit Diagnosis:  Abnormality of gait  Decreased coordination      Subjective Assessment - 12/26/14 0852    Subjective No pain, no changes, just a little stiffness in muscles from doing new exercises.   Currently in Pain? No/denies                      [facility] Adult PT Treatment/Exercise - 12/26/14 0001    Exercises   Exercises Knee/Hip   Knee/Hip Exercises: Stretches   Active Hamstring Stretch 3 reps;30 seconds  Sitting position   Other Knee/Hip Stretches seated hip external rotation 3 x 30 seconds           [facility]) - 12/26/14 0859    PWR! exercises Moves in  sitting;Moves in standing   PWR! Up 10   PWR! Rock 20  targets for cues for reaching and shifting   PWR Step 10  Cues for increased intensity/foot clearance   Comments Pt requires cues for large ampllitude and for improved weightshifting through hips    PWR! Up 10   PWR! Rock 10   PWR! Twist 10  modified   PWR! Step 10   Comments Pt performs HEP with minimal cues              PT Education - 12/26/14 1046    Education provided Yes   Education Details Seated lower extremity stretches   Person(s) Educated Patient   Methods Explanation;Demonstration;Handout;Verbal cues   Comprehension Returned demonstration;Verbalized understanding;Need further instruction             PT Long Term Goals - 12/25/14 1438    PT LONG TERM GOAL #1   Title Incr. Berg balance test score to >/= 51/56 to decr. fall risk  (01-12-15)   Baseline 47/56 at eval   Time 4   Period Weeks   Status New   PT LONG TERM GOAL #2   Title Incr. gait velocity to >/= 3.6 ft/sec without device for incr. gait efficiency  (01-12-15)   Time 4   Period Weeks   Status New   PT LONG TERM GOAL #3   Title Functional Gait Assessment score increased to at least 17/30 for decreased fall risk   Baseline FGA score 12/30 on 12/25/14   Time 4   Period Weeks   Status New   PT LONG TERM GOAL #4   Title Verbalize understanding of community exercise classess for Parkinsons' patients   Time 4   Period Weeks   Status New   PT LONG TERM GOAL #5   Title Independent in HEP for balance/BIG exercises   Time 4   Period Weeks   Status New   PT LONG TERM GOAL #6   Title Incr. FOTO score by at least 10 points for improved pt satisfaction   Baseline completed on 12-13-14   Time 4   Period Weeks   Status New               Plan - 12/26/14 1047    Clinical Impression Statement Pt performs sitting PWR! Moves as part of HEP without difficulty, continuing to need cues for increased intensity/large amplitude movement pattern.   Pt has difficulty sequencing standing weigthshifting and stepping activities.  Lower extremity stretches given due  to pt's c/o tightness in R lateral thigh area during standing activities.  Pt will continue to benefit from further skiled PT to address posture, flexibility, functional strength, balance and gait.   Pt will benefit from skilled therapeutic intervention in order to improve on the following deficits Abnormal gait;Decreased balance;Decreased mobility;Decreased strength;Decreased coordination;Decreased endurance;Impaired flexibility   Rehab Potential Good   PT Frequency 2x / week   PT Duration 4 weeks  wk 1 of 4   PT Treatment/Interventions ADLs/Self Care Home Management;DME Instruction;Gait training;Stair training;Functional mobility training;Patient/family education;Neuromuscular re-education;Balance training;Therapeutic exercise;Therapeutic activities   PT Next Visit Plan Review seated stretches and try SciFit for intensity; work on standing weightshifting and stepping activities   Consulted and Agree with Plan of Care Patient        Problem List Patient Active Problem List   Diagnosis Date Noted  . CAD (coronary artery disease), native coronary artery 11/28/2014  . BPH (benign prostatic hypertrophy) with urinary retention 11/19/2014  . Diastolic dysfunction 67/05/4579  . UTI (urinary tract infection) 10/07/2014  . Abnormal EKG 10/04/2014  . Urinary retention 10/04/2014  . BPH (benign prostatic hypertrophy) 10/04/2014  . UTI (lower urinary tract infection) 10/03/2014  . Sepsis 10/03/2014  . Parkinsonism 06/12/2014  . Dilated aortic root 05/29/2014  . CKD (chronic kidney disease), stage III 04/25/2014  . Gastritis   . Peptic ulcer disease   . Stroke   . Hx of CABG 2005, PCI 2010   . Parkinson's disease   . Small vessel disease, cerebrovascular 10/30/2013  . Intracerebral hemorrhage 10/30/2013  . Tremor 08/14/2013  . Hypertensive cardiovascular disease 04/13/2013  .  Dyslipidemia 04/13/2013  . LBBB (left bundle branch block) 04/13/2013  . Preoperative clearance 04/13/2013  . DJD (degenerative joint disease) of lumbar spine 04/13/2013    Porfiria Heinrich W. 12/26/2014, 10:50 AM  Frazier Butt., PT [facility] [address] [place], [place], [address] Phone: [phone]   Fax:  [phone]

## 2014-12-26 NOTE — Patient Instructions (Addendum)
HIP: Hamstrings - Short Sitting   Rest leg on raised surface/or on floor. Keep knee straight and sit up tall. Lift chest. Hold _30__ seconds. _3__ reps per set, _2-3__ sets per day  Copyright  VHI. All rights reserved.  HIP: External Rotation   Sit at edge of surface. Cross one leg over other knee. Press down gently on knee. Hold _30__ seconds. _3__ reps per set, _2-3__ sets per day  Copyright  VHI. All rights reserved.

## 2014-12-28 ENCOUNTER — Ambulatory Visit: Payer: Medicare Other | Admitting: Physical Therapy

## 2014-12-28 DIAGNOSIS — N390 Urinary tract infection, site not specified: Secondary | ICD-10-CM | POA: Diagnosis not present

## 2014-12-28 DIAGNOSIS — R3915 Urgency of urination: Secondary | ICD-10-CM | POA: Diagnosis not present

## 2014-12-28 DIAGNOSIS — R350 Frequency of micturition: Secondary | ICD-10-CM | POA: Diagnosis not present

## 2015-01-01 ENCOUNTER — Telehealth: Payer: Self-pay | Admitting: Cardiology

## 2015-01-01 ENCOUNTER — Encounter: Payer: Self-pay | Admitting: Physician Assistant

## 2015-01-01 ENCOUNTER — Ambulatory Visit: Payer: Medicare Other | Attending: Internal Medicine | Admitting: Physical Therapy

## 2015-01-01 ENCOUNTER — Ambulatory Visit (INDEPENDENT_AMBULATORY_CARE_PROVIDER_SITE_OTHER): Payer: Medicare Other | Admitting: Physician Assistant

## 2015-01-01 VITALS — BP 155/109 | HR 56

## 2015-01-01 VITALS — BP 150/98 | HR 55 | Ht 68.0 in | Wt 192.0 lb

## 2015-01-01 DIAGNOSIS — I2510 Atherosclerotic heart disease of native coronary artery without angina pectoris: Secondary | ICD-10-CM | POA: Diagnosis not present

## 2015-01-01 DIAGNOSIS — R269 Unspecified abnormalities of gait and mobility: Secondary | ICD-10-CM

## 2015-01-01 DIAGNOSIS — N183 Chronic kidney disease, stage 3 unspecified: Secondary | ICD-10-CM

## 2015-01-01 DIAGNOSIS — I5032 Chronic diastolic (congestive) heart failure: Secondary | ICD-10-CM

## 2015-01-01 DIAGNOSIS — I509 Heart failure, unspecified: Secondary | ICD-10-CM

## 2015-01-01 DIAGNOSIS — I110 Hypertensive heart disease with heart failure: Secondary | ICD-10-CM

## 2015-01-01 DIAGNOSIS — E785 Hyperlipidemia, unspecified: Secondary | ICD-10-CM

## 2015-01-01 DIAGNOSIS — I77810 Thoracic aortic ectasia: Secondary | ICD-10-CM

## 2015-01-01 DIAGNOSIS — G20 Parkinson's disease: Secondary | ICD-10-CM

## 2015-01-01 MED ORDER — CARVEDILOL 6.25 MG PO TABS: 6.2500 mg | ORAL_TABLET | Freq: Two times a day (BID) | ORAL | Status: DC

## 2015-01-01 NOTE — Patient Instructions (Signed)
Stroke Prevention Some medical conditions and behaviors are associated with an increased chance of having a stroke. You may prevent a stroke by making healthy choices and managing medical conditions. HOW CAN I REDUCE MY RISK OF HAVING A STROKE?   Stay physically active. Get at least 30 minutes of activity on most or all days.  Do not smoke. It may also be helpful to avoid exposure to secondhand smoke.  Limit alcohol use. Moderate alcohol use is considered to be:  No more than 2 drinks per day for men.  No more than 1 drink per day for nonpregnant women.  Eat healthy foods. This involves:  Eating 5 or more servings of fruits and vegetables a day.  Making dietary changes that address high blood pressure (hypertension), high cholesterol, diabetes, or obesity.  Manage your cholesterol levels.  Making food choices that are high in fiber and low in saturated fat, trans fat, and cholesterol may control cholesterol levels.  Take any prescribed medicines to control cholesterol as directed by your health care provider.  Manage your diabetes.  Controlling your carbohydrate and sugar intake is recommended to manage diabetes.  Take any prescribed medicines to control diabetes as directed by your health care provider.  Control your hypertension.  Making food choices that are low in salt (sodium), saturated fat, trans fat, and cholesterol is recommended to manage hypertension.  Take any prescribed medicines to control hypertension as directed by your health care provider.  Maintain a healthy weight.  Reducing calorie intake and making food choices that are low in sodium, saturated fat, trans fat, and cholesterol are recommended to manage weight.  Stop drug abuse.  Avoid taking birth control pills.  Talk to your health care provider about the risks of taking birth control pills if you are over 35 years old, smoke, get migraines, or have ever had a blood clot.  Get evaluated for sleep  disorders (sleep apnea).  Talk to your health care provider about getting a sleep evaluation if you snore a lot or have excessive sleepiness.  Take medicines only as directed by your health care provider.  For some people, aspirin or blood thinners (anticoagulants) are helpful in reducing the risk of forming abnormal blood clots that can lead to stroke. If you have the irregular heart rhythm of atrial fibrillation, you should be on a blood thinner unless there is a good reason you cannot take them.  Understand all your medicine instructions.  Make sure that other conditions (such as anemia or atherosclerosis) are addressed. SEEK IMMEDIATE MEDICAL CARE IF:   You have sudden weakness or numbness of the face, arm, or leg, especially on one side of the body.  Your face or eyelid droops to one side.  You have sudden confusion.  You have trouble speaking (aphasia) or understanding.  You have sudden trouble seeing in one or both eyes.  You have sudden trouble walking.  You have dizziness.  You have a loss of balance or coordination.  You have a sudden, severe headache with no known cause.  You have new chest pain or an irregular heartbeat. Any of these symptoms may represent a serious problem that is an emergency. Do not wait to see if the symptoms will go away. Get medical help at once. Call your local emergency services (911 in [place] Do not drive yourself to the hospital. Document Released: 07/23/2004 Document Revised: 10/30/2013 Document Reviewed: 12/16/2012 Exit[employer] Patient Information 2015 [employer]. This information is not intended to replace advice given   to you by your health care provider. Make sure you discuss any questions you have with your health care provider.  

## 2015-01-01 NOTE — Telephone Encounter (Signed)
Pt c/o BP issue: STAT if pt c/o blurred vision, one-sided weakness or slurred speech  1. What are your last 5 BP readings? 173/108 at 12:20pm  2. Are you having any other symptoms (ex. Dizziness, headache, blurred vision, passed out)? Not feeling great w/Headache  3. What is your BP issue? Need to know what or where does pt need to do concerning this high bp.

## 2015-01-01 NOTE — Progress Notes (Signed)
Cardiology Office Note   Date:  01/01/2015   ID:  Darryl [name], DOB [birth year], MRN [id number]  PCP:  Tawanna Solo, MD  Cardiologist:  Dr. [name]     Chief Complaint  Patient presents with  . Follow-up    Hypertension     History of Present Illness: Darryl [name] is a 80 y.o. male with a hx of CAD status post CABG in 11/2003 and subsequent PCI to the radial graft to the OM with a DES and DES to the SVG-RCA in 04/2004 and BMS to LCx in 2010, LBBB, HTN, HL, CKD, PUD/gastritis, Parkinson's disease.  Last seen by Dr. [name] 11/29/14.  BP was well controlled at that time.  He was seen at [facility] today for PT.  He c/o of not feeling well and a HA.  BP was 173/108.  He is added on to my schedule for further evaluation.    His BP at home has been "okay."  He took his medications 30 minutes before going to PT this AM.  He thinks this may be why it was high. He had a HA at that time.  This is now resolved.  The patient denies chest pain, shortness of breath, syncope, orthopnea, PND or significant pedal edema.    Studies/Reports Reviewed Today:  Echo 05/01/14 Severe LVH, EF 56-81%, grade 1 diastolic dysfunction Borderline dilated aortic root and ascending aorta (Root 37 mm, ascending aorta 39 mm) MAC, mild MR Mild LAE Normal RV function PASP 29 mmHg  Carotid US 11/10/13 No significant ICA stenosis bilaterally  Myoview 04/18/13 No evidence for inducible ischemia Remote infarct involving the septum and inferior wall resulting in akinetic and dyskinetic segments EF 58%   Past Medical History  Diagnosis Date  . LBBB (left bundle branch block)   . Sleep disturbance   . Insomnia   . Tremor   . HTN (hypertension)     orthostatic  . Dyslipidemia   . Thrombocytopenia   . Colon polyp   . Diverticulosis   . Renal insufficiency     renal duplex scan in 2012 showed no renal artery stenosis  . Lumbar spondylolysis     multilevel DJD with mild spinal  stenosis L3-L5  . Headache(784.0)   . Arthritis   . Thrombocytopenia   . Elevated fasting glucose   . Gastritis   . Hiatal hernia   . Migraines   . H/O urinary retention     post surgical procedures; has foley cath  . Parkinson's disease   . Stroke     a. remote hx of stroke reportedly r/t HTN.  . Intracranial hemorrhage     a. small pontine [place] 2003.  [name] Peptic ulcer disease     a. prior hx of bleeding ulcers, including 12/2003 in setting of aspirin use.  . Gastritis     a. by EGD 04/2004.  [name] Hypertensive heart disease   . Dilated aortic root 05/29/2014  . UTI (urinary tract infection)   . CHF (congestive heart failure)     hx of diastolic chf w last admission  . Coronary atherosclerosis of native coronary artery     a. s/p CABG 11/2003. b. s/p DES to radial-OM and SVG-RCA 11/05. c. s/p BMS to LCx 11/2008.  [name] HOH (hard of hearing)     slight hoh, rt>l  . Wears glasses   . BPH (benign prostatic hypertrophy) with urinary retention     Past Surgical History  Procedure Laterality  Date  . Wisdom tooth extraction    . Esophagogastroduodenoscopy      10/2001, 12/2003, 04/2004  . Coronary artery bypass graft  2005  . Shoulder arthroscopy Left 9/10  . Appendectomy  62    1963  . Stones      hx  . Cholecystectomy  1998  . Lumbar laminectomy/decompression microdiscectomy N/A 05/03/2013    Procedure: LUMBAR LAMINECTOMY/DCOMPRESSION MICRODISCECTOMY RIGHT LUMBAR FOUR FIVE;  Surgeon: [name], MD;  Location: [facility];  Service: Neurosurgery;  Laterality: N/A;  . Colonoscopy  10/2001  . Colon surgery  03/14/2008  . Coronary angioplasty  6/10,10/05    Dr. [name] cardiologist: last office visit 11/07/14  . Transurethral resection of prostate N/A 11/19/2014    Procedure: TRANSURETHRAL RESECTION OF THE PROSTATE WITH GYRUS INSTRUMENTS;  Surgeon: [name], MD;  Location: [facility];  Service: Urology;  Laterality: N/A;  . Cystoscopy with litholapaxy N/A  11/19/2014    Procedure: CYSTOSCOPY WITH LITHOLAPAXY;  Surgeon: [name], MD;  Location: [facility];  Service: Urology;  Laterality: N/A;     Current Outpatient Prescriptions  Medication Sig Dispense Refill  . acetaminophen (TYLENOL) 500 MG tablet Take 1,000 mg by mouth every 8 (eight) hours as needed for mild pain or moderate pain.    [name] aspirin EC 81 MG tablet Take 81 mg by mouth daily.    . citalopram (CELEXA) 20 MG tablet Take 20 mg by mouth daily.    . cloNIDine (CATAPRES) 0.2 MG tablet Take 0.2-0.3 mg by mouth 2 (two) times daily. Takes 1 tablet in am and 1.5 tablets in PM daily    . furosemide (LASIX) 20 MG tablet Take 1 tablet (20 mg total) by mouth daily. 90 tablet 3  . irbesartan (AVAPRO) 300 MG tablet Take 300 mg by mouth every morning.    . Meth-Hyo-M Bl-Na Phos-Ph Sal (URIBEL) 118 MG CAPS Take 1 tablet by mouth 2-3 times a day    . Omega-3 Fatty Acids (FISH OIL PO) Take 800 mg by mouth daily. Carlson's Liquid    . pantoprazole (PROTONIX) 40 MG tablet Take 40 mg by mouth every morning.    . potassium chloride (K-DUR) 10 MEQ tablet Take 1 tablet (10 mEq total) by mouth daily. 30 tablet 1  . traMADol (ULTRAM) 50 MG tablet Take 50 mg by mouth daily as needed (pain).     . carvedilol (COREG) 6.25 MG tablet Take 1 tablet (6.25 mg total) by mouth 2 (two) times daily. 60 tablet 6   No current facility-administered medications for this visit.    Allergies:   Ambien; Aspirin; Celebrex; Nsaids; Toprol xl; Altace; Amlodipine; Levothyroxine; Biaxin; and Tetracyclines & related    Social History:  The patient  reports that he quit smoking about 55 years ago. His smoking use included Cigarettes. He has a 10 pack-year smoking history. He has never used smokeless tobacco. He reports that he does not drink alcohol or use illicit drugs.   Family History:  The patient's family history includes CAD in his sister; CAD (age of onset: 53) in his father.    ROS:   Please see the  history of present illness.   Review of Systems  HENT: Positive for headaches.   All other systems reviewed and are negative.     PHYSICAL EXAM: VS:  BP 150/98 mmHg  Pulse 55  Ht 5\' 8"  (1.727 m)  Wt 192 lb (87.091 kg)  BMI 29.20 kg/m2  Wt Readings from Last 3 Encounters:  01/01/15 192 lb (87.091 kg)  11/29/14 198 lb 6.4 oz (89.994 kg)  11/19/14 199 lb (90.266 kg)     GEN: Well nourished, well developed, in no acute distress HEENT: normal Neck: no JVD, no masses Cardiac:  Normal S1/S2, RRR; no murmur ,  no rubs or gallops, no edema   Respiratory:  clear to auscultation bilaterally, no wheezing, rhonchi or rales. GI: soft, nontender, nondistended, + BS MS: no deformity or atrophy Skin: warm and dry  Neuro:  CNs II-XII intact, Strength and sensation are intact Psych: Normal affect   EKG:  EKG is ordered today.  It demonstrates:   Sinus brady, HR 55, LBBB   Recent Labs: 10/10/2014: B Natriuretic Peptide 878.5* 10/12/2014: Platelets 185 11/19/2014: Hemoglobin 13.6 12/06/2014: ALT 22; BUN 27*; Creatinine, Ser 1.46; Potassium 4.0; Sodium 137    Lipid Panel    Component Value Date/Time   CHOL 147 12/06/2014 0757   TRIG 201.0* 12/06/2014 0757   HDL 29.40* 12/06/2014 0757   CHOLHDL 5 12/06/2014 0757   VLDL 40.2* 12/06/2014 0757   LDLDIRECT 42.0 12/06/2014 0757      ASSESSMENT AND PLAN:  Hypertensive heart disease with heart failure:  Blood pressure remains elevated upon my check. Blood pressure on the right 160/90 and on the left 150/98.  He denies salt indiscretion.  He does not take any stimulants.  Clonidine makes him sleepy.  He took it just before he went to PT today.  However, his BP is still uncontrolled.  He is intolerant of Amlodipine.  I will stop Metoprolol Tartrate.  Start Carvedilol 6.25 mg Twice daily.  FU with RN visit in 1 week.  Record BP and bring list to RN visit.  Bring BP machine to have this checked with our Hg cuff.    Chronic diastolic CHF  (congestive heart failure):  Volume appears stable. Continue current Rx.   Dilated aortic root  Aggressive BP control.  Adjust beta-blocker as noted.  FU echo pending in 04/2015.    Coronary artery disease involving native coronary artery of native heart without angina pectoris:  No angina.  Continue ASA, beta-blocker.  CKD (chronic kidney disease), stage III:  Recent Creatinine stable.  Dyslipidemia:  Statin intolerant.  Parkinson's disease:  Continue PT.    Medication Changes: Current medicines are reviewed at length with the patient today.  Concerns regarding medicines are as outlined above.  The following changes have been made:   Discontinued Medications   ASPIRIN 81 MG TABLET    Take 81 mg by mouth every Monday, Wednesday, and Friday.    HYDROCODONE-ACETAMINOPHEN (NORCO) 7.5-325 MG PER TABLET    Take 1-2 tablets by mouth every 4 (four) hours as needed for moderate pain. Maximum dose per 24 hours - 8 pills   METOPROLOL (LOPRESSOR) 50 MG TABLET    Take 50 mg by mouth 2 (two) times daily.   TAMSULOSIN (FLOMAX) 0.4 MG CAPS CAPSULE    Take 0.4 mg by mouth daily.   Modified Medications   No medications on file   New Prescriptions   CARVEDILOL (COREG) 6.25 MG TABLET    Take 1 tablet (6.25 mg total) by mouth 2 (two) times daily.    Labs/ tests ordered today include:   Orders Placed This Encounter  Procedures  . EKG 12-Lead     Disposition:   FU with me in 1 month.     Signed, Richardson Dopp, PA-C, MHS 01/01/2015 5:02 PM  [facility] [place], [place], [place]  [address] Phone: [phone]; Fax: [phone]

## 2015-01-01 NOTE — Telephone Encounter (Signed)
Received a call from Darryl [name] with [facility].She stated when patient came in he complained of just not feeling well,slight headache.No chest pain,no sob.B/P 173/108 pulse 58.After 15 mins she rechecked B/P 168/103 pulse 56.Stated patient took all medication prescribed.Appointment scheduled with Richardson Dopp PA today at 3:45 pm.Advised to go to ER if needed.

## 2015-01-01 NOTE — Therapy (Signed)
[facility] [address] [place], [place], [address] Phone: [phone]   Fax:  [phone]  Physical Therapy Treatment  Patient Details  Name: Darryl [name] MRN: [id number] Date of Birth: 08/01/1934 Referring Provider:  [name], MD  Encounter Date: 01/01/2015      PT End of Session - 01/01/15 1440    Visit Number 4   Number of Visits 9   Date for PT Re-Evaluation 01/12/15   Authorization Type Medicare   Authorization Time Period 12-13-14  - 02-11-15   PT Start Time 1152   PT Stop Time 1245  >10 minutes spent on phone with/waiting for physician's office to call back   PT Time Calculation (min) 53 min   Equipment Utilized During Treatment Gait belt   Activity Tolerance Treatment limited secondary to medical complications (Comment)  See blood pressure measurements-physician notified      Past Medical History  Diagnosis Date  . LBBB (left bundle branch block)   . Sleep disturbance   . Insomnia   . Tremor   . HTN (hypertension)     orthostatic  . Dyslipidemia   . Thrombocytopenia   . Colon polyp   . Diverticulosis   . Renal insufficiency     renal duplex scan in 2012 showed no renal artery stenosis  . Lumbar spondylolysis     multilevel DJD with mild spinal stenosis L3-L5  . Headache(784.0)   . Arthritis   . Thrombocytopenia   . Elevated fasting glucose   . Gastritis   . Hiatal hernia   . Migraines   . H/O urinary retention     post surgical procedures; has foley cath  . Parkinson's disease   . Stroke     a. remote hx of stroke reportedly r/t HTN.  . Intracranial hemorrhage     a. small pontine [place] 2003.  [name] Peptic ulcer disease     a. prior hx of bleeding ulcers, including 12/2003 in setting of aspirin use.  . Gastritis     a. by EGD 04/2004.  [name] Hypertensive heart disease   . Dilated aortic root 05/29/2014  . UTI (urinary tract infection)   . CHF (congestive heart failure)     hx of  diastolic chf w last admission  . Coronary atherosclerosis of native coronary artery     a. s/p CABG 11/2003. b. s/p DES to radial-OM and SVG-RCA 11/05. c. s/p BMS to LCx 11/2008.  [name] HOH (hard of hearing)     slight hoh, rt>l  . Wears glasses   . BPH (benign prostatic hypertrophy) with urinary retention     Past Surgical History  Procedure Laterality Date  . Wisdom tooth extraction    . Esophagogastroduodenoscopy      10/2001, 12/2003, 04/2004  . Coronary artery bypass graft  2005  . Shoulder arthroscopy Left 9/10  . Appendectomy  62    1963  . Stones      hx  . Cholecystectomy  1998  . Lumbar laminectomy/decompression microdiscectomy N/A 05/03/2013    Procedure: LUMBAR LAMINECTOMY/DCOMPRESSION MICRODISCECTOMY RIGHT LUMBAR FOUR FIVE;  Surgeon: [name], MD;  Location: [facility];  Service: Neurosurgery;  Laterality: N/A;  . Colonoscopy  10/2001  . Colon surgery  03/14/2008  . Coronary angioplasty  6/10,10/05    Dr. [name] cardiologist: last office visit 11/07/14  . Transurethral resection of prostate N/A 11/19/2014    Procedure: TRANSURETHRAL RESECTION OF THE PROSTATE WITH GYRUS INSTRUMENTS;  Surgeon:  [name], MD;  Location: [facility];  Service: Urology;  Laterality: N/A;  . Cystoscopy with litholapaxy N/A 11/19/2014    Procedure: CYSTOSCOPY WITH LITHOLAPAXY;  Surgeon: [name], MD;  Location: [facility];  Service: Urology;  Laterality: N/A;    Filed Vitals:   01/01/15 1220 01/01/15 1235  BP: 173/108 155/109  Pulse: 58 56    Visit Diagnosis:  Abnormality of gait      Subjective Assessment - 01/01/15 1153    Subjective Not feeling all that great today-"down in the dumps"; legs not working as good as they normally do   Currently in Pain? No/denies                         [facility] Adult PT Treatment/Exercise - 01/01/15 1202    Ambulation/Gait   Ambulation/Gait Yes   Ambulation/Gait Assistance 4: Min guard    Ambulation Distance (Feet) 300 Feet  then 200   Assistive device None  bilateral walking poles   Gait Pattern Step-through pattern;Trunk flexed;Narrow base of support;Poor foot clearance - left;Poor foot clearance - right   Ambulation Surface Level;Indoor   Knee/Hip Exercises: Stretches   Active Hamstring Stretch 3 reps;30 seconds  sitting   Other Knee/Hip Stretches seated hip external rotation 3 x 30 seconds   Knee/Hip Exercises: Aerobic   Stepper Seated SciFit Stepper, Level 2, lower extremities only, x 8 minutes, with cues for intensity above 65 RPM  with greatest intensity:  >90 RPM                PT Education - 01/01/15 1250    Education provided Yes   Education Details CVA warning signs   Person(s) Educated Patient;Spouse   Methods Explanation;Handout   Comprehension Verbalized understanding             PT Long Term Goals - 12/25/14 1438    PT LONG TERM GOAL #1   Title Incr. Berg balance test score to >/= 51/56 to decr. fall risk  (01-12-15)   Baseline 47/56 at eval   Time 4   Period Weeks   Status New   PT LONG TERM GOAL #2   Title Incr. gait velocity to >/= 3.6 ft/sec without device for incr. gait efficiency  (01-12-15)   Time 4   Period Weeks   Status New   PT LONG TERM GOAL #3   Title Functional Gait Assessment score increased to at least 17/30 for decreased fall risk   Baseline FGA score 12/30 on 12/25/14   Time 4   Period Weeks   Status New   PT LONG TERM GOAL #4   Title Verbalize understanding of community exercise classess for Parkinsons' patients   Time 4   Period Weeks   Status New   PT LONG TERM GOAL #5   Title Independent in HEP for balance/BIG exercises   Time 4   Period Weeks   Status New   PT LONG TERM GOAL #6   Title Incr. FOTO score by at least 10 points for improved pt satisfaction   Baseline completed on 12-13-14   Time 4   Period Weeks   Status New               Plan - 01/01/15 1433    Clinical Impression  Statement Pt reports overall not feeling well today.  He does well with increased bouts of intensity using SciFit seated stepper; however, he is noted to  have more shuffling during gait today.  Contacted physician due to pt's blood pressure.  Pt will continue to benefit from further skilled PT to address posture, flexibility, functional strength, balance and gait.   Pt will benefit from skilled therapeutic intervention in order to improve on the following deficits Abnormal gait;Decreased balance;Decreased mobility;Decreased strength;Decreased coordination;Decreased endurance;Impaired flexibility   Rehab Potential Good   PT Frequency 2x / week   PT Duration 4 weeks  wk 2 of 4   PT Treatment/Interventions ADLs/Self Care Home Management;DME Instruction;Gait training;Stair training;Functional mobility training;Patient/family education;Neuromuscular re-education;Balance training;Therapeutic exercise;Therapeutic activities   PT Next Visit Plan Monitor blood pressure; work on standing weightshifting,stepping and gait activities   Consulted and Agree with Plan of Care Patient        Problem List Patient Active Problem List   Diagnosis Date Noted  . CAD (coronary artery disease), native coronary artery 11/28/2014  . BPH (benign prostatic hypertrophy) with urinary retention 11/19/2014  . Diastolic dysfunction 40/98/1191  . UTI (urinary tract infection) 10/07/2014  . Abnormal EKG 10/04/2014  . Urinary retention 10/04/2014  . BPH (benign prostatic hypertrophy) 10/04/2014  . UTI (lower urinary tract infection) 10/03/2014  . Sepsis 10/03/2014  . Parkinsonism 06/12/2014  . Dilated aortic root 05/29/2014  . CKD (chronic kidney disease), stage III 04/25/2014  . Gastritis   . Peptic ulcer disease   . Stroke   . Hx of CABG 2005, PCI 2010   . Parkinson's disease   . Small vessel disease, cerebrovascular 10/30/2013  . Intracerebral hemorrhage 10/30/2013  . Tremor 08/14/2013  . Hypertensive  cardiovascular disease 04/13/2013  . Dyslipidemia 04/13/2013  . LBBB (left bundle branch block) 04/13/2013  . Preoperative clearance 04/13/2013  . DJD (degenerative joint disease) of lumbar spine 04/13/2013    MARRIOTT,AMY W. 01/01/2015, 2:41 PM  Frazier Butt., PT  [facility] [address] [place], [place], [address] Phone: [phone]   Fax:  [phone]

## 2015-01-01 NOTE — Patient Instructions (Signed)
Medication Instructions:  1.  Stop taking Metoprolol Tartrate 2.  Start taking Carvedilol (Coreg) 6.25 mg Twice daily   Labwork: None   Testing/Procedures: None   Follow-Up: 1.  Schedule a nurse visit in 1 week.  Bring your blood pressure machine with you so we can check it as well. 2.  Schedule follow up with Richardson Dopp, PA-C in 1 month.  Any Other Special Instructions Will Be Listed Below (If Applicable). Please check your blood pressure once daily.  Record this and bring to your nurse visit in 1 week as well as to your appointment with Richardson Dopp, PA-C

## 2015-01-02 ENCOUNTER — Ambulatory Visit: Payer: Medicare Other | Admitting: Physical Therapy

## 2015-01-03 ENCOUNTER — Ambulatory Visit: Payer: Medicare Other | Admitting: Physical Therapy

## 2015-01-03 VITALS — BP 213/108 | HR 63

## 2015-01-03 DIAGNOSIS — R269 Unspecified abnormalities of gait and mobility: Secondary | ICD-10-CM

## 2015-01-03 NOTE — Therapy (Signed)
[facility] [address] [place], [place], [address] Phone: [phone]   Fax:  [phone]  Patient Details  Name: Darryl [name] MRN: [id number] Date of Birth: 02-19-1935 Referring Provider:  [name], MD  Encounter Date: 01/03/2015  At initiation of session today, checked blood pressure, with blood pressure 213/108 and pulse 63 bpm using automatic cuff.  At attempt to retry blood pressure with manual machine, pt request for PT to not recheck blood pressure.  He states that he does not like coming to therapy to get his blood pressure checked.  He states that he will not be coming back to therapy.  PT attempted to explain that when blood pressure parameters are too high, we are not allowed to continue to treat and should notify physician.  He states that he did not take blood pressure medications and did not check blood pressure prior to coming into therapy today.  He leaves therapy session abruptly with wife.  Jesselee Poth W. 01/03/2015, 8:19 AM  Frazier Butt., PT  [facility] [address] [place], [place], [address] Phone: [phone]   Fax:  [phone]

## 2015-01-04 ENCOUNTER — Telehealth: Payer: Self-pay | Admitting: *Deleted

## 2015-01-04 ENCOUNTER — Ambulatory Visit: Payer: Medicare Other | Admitting: Physical Therapy

## 2015-01-04 NOTE — Telephone Encounter (Signed)
I s/w about his BP yesterday at PT 213/108, see phone note 01/03/15. I asked pt if he has monitored his BP and he said no. I asked pt to please check BP while I had him on the phone. His BP cuff kept giving an error message and he checked both arms. I advised pt that this could be that he has a regular size cuff and not a large enough cuff for him or that his BP is too high. I recommended to pt to please go to [facility], [facility], [facility] or one of these type stores tonight and please check his BP. I also advised pt that if his BP is still like yesterday 213/108 he has been advised to go to an [facility] ED that his BP that high is too high and risk for stroke. I asked for pt to please call me Monday and let me know how he did over the weekend. I will let Richardson Dopp, PAC know of our conversation today. Pt agreeable to plan of care.

## 2015-01-06 NOTE — Telephone Encounter (Signed)
Agree Richardson Dopp, PA-C   01/06/2015 8:37 PM

## 2015-01-07 ENCOUNTER — Telehealth: Payer: Self-pay | Admitting: Cardiology

## 2015-01-07 ENCOUNTER — Telehealth: Payer: Self-pay | Admitting: *Deleted

## 2015-01-07 DIAGNOSIS — L988 Other specified disorders of the skin and subcutaneous tissue: Secondary | ICD-10-CM | POA: Diagnosis not present

## 2015-01-07 DIAGNOSIS — D1801 Hemangioma of skin and subcutaneous tissue: Secondary | ICD-10-CM | POA: Diagnosis not present

## 2015-01-07 DIAGNOSIS — D485 Neoplasm of uncertain behavior of skin: Secondary | ICD-10-CM | POA: Diagnosis not present

## 2015-01-07 DIAGNOSIS — D692 Other nonthrombocytopenic purpura: Secondary | ICD-10-CM | POA: Diagnosis not present

## 2015-01-07 DIAGNOSIS — Z85820 Personal history of malignant melanoma of skin: Secondary | ICD-10-CM | POA: Diagnosis not present

## 2015-01-07 DIAGNOSIS — D2272 Melanocytic nevi of left lower limb, including hip: Secondary | ICD-10-CM | POA: Diagnosis not present

## 2015-01-07 MED ORDER — HYDRALAZINE HCL 25 MG PO TABS: 25.0000 mg | ORAL_TABLET | Freq: Three times a day (TID) | ORAL | Status: DC

## 2015-01-07 NOTE — Telephone Encounter (Signed)
Called to check in with patient after he was started on Hydralazine today. He took the first dose at 1300. His headache has resolved at this time. His blood pressure at 1500 = 180/105, HR 60 and now BP at 1610 = 179/114, HR 59. Reviewed by Richardson Dopp, PA. Advised for patient to continue taking Hydralazine, as directed, and call office back tomorrow AM to let us know how BP is doing. Also if patient becomes symptomatic (dizziness, SOB, dyspnea, numbness, tingling, etc) he needs to proceed to ED or Urgent Care. Patient and his wife verbalized understanding and appreciation for return phone call. Will route to Julaine Hua, CMA, and Triage so that appropriate follow up tomorrow can be done.

## 2015-01-07 NOTE — Telephone Encounter (Signed)
Patient's wife, [name] (DPR on file), called to report the patient's BP over the weekend. See BP readings below. BP remains very high. Patient also has mild headache this AM. Patient does not want to go to ED. Patient's wife (patient there with wife) advised of the dangers of elevated BP. They verbalized understanding and concern. Today's BP this morning was taken prior to taking his medication. Patient has appointment for nurse room BP check on Thursday. Reviewed information with Richardson Dopp, PA.  Advised for patient to begin taking Hydralazine 25 mg three times per day. Rx called in to the pharmacy. Wife notified of Rx and directions on use. Patient also advised that he should continue taking his BP at least twice daily. He is to call office back if the BP values continue to be as elevated and/or if his headache continues or worsens throughout the day or he develops other sx (dizziness, sob, tingling, etc). Patient advised he can use a cool cloth/compress to the back of his neck to help alleviate his headache. Patient's wife verbalized understanding and will pick up new medication promptly.

## 2015-01-07 NOTE — Telephone Encounter (Signed)
New message     Returning Carol's call regarding blood pressure readings. Pt also has other questions regarding medication.  Please call to advise.  July 8 6:15 pm 220/125   After taking newer b/p medication 8pm 134/104 July 9 8:25 am 215/126   After taking newer b/p medication 11:15am  140/93 July 10 8:10 am 203/116 After taking newer b/p medication  11:00 142/90 July 11 8:10 am 183/125

## 2015-01-08 NOTE — Telephone Encounter (Signed)
Follow up      Pt calling with b/p reading that was requested. B/p was 131/84. Please call to advise

## 2015-01-08 NOTE — Telephone Encounter (Signed)
Bp check already scheduled for 01/10/15 at 2pm.

## 2015-01-08 NOTE — Telephone Encounter (Signed)
Spoke with pt and he states BP this morning is 131/84, HR 63. Denies visual disturbances. States he has a "slight HA" and states he "always has some kind of HA". Pt states he did take morning meds. Will forward to Richardson Dopp, PA-C to make him aware.

## 2015-01-08 NOTE — Telephone Encounter (Signed)
Improved Get BP check with RN later this week as planned. Richardson Dopp, PA-C   01/08/2015 5:03 PM

## 2015-01-10 ENCOUNTER — Ambulatory Visit (INDEPENDENT_AMBULATORY_CARE_PROVIDER_SITE_OTHER): Payer: Medicare Other

## 2015-01-10 VITALS — BP 140/92 | HR 71 | Ht 68.0 in | Wt 201.4 lb

## 2015-01-10 DIAGNOSIS — I10 Essential (primary) hypertension: Secondary | ICD-10-CM | POA: Diagnosis not present

## 2015-01-10 NOTE — Progress Notes (Signed)
1.) Reason for visit: BP check  2.) Name of MD requesting visit: Kathleen Argue, PA  3.) H&P: Pt has increase BP over the past month.    4.) ROS related to problem: Pt home BP over the past few days has been very elevated. Pt called in was put on Hydralazine 25 TID and pt in today for BP check.  Pt BP today 140/92 HR 71.  Pt brought in home machines to calibrate and got much higher readings ( 152/104 and 177/109). Pt instructed to get new machine or call company and see if company can send them a larger cuff. Pt current BP reviewed with Kathleen Argue, not changes at this time.  Pt wife asked as she had stopped giving him his Clonidine TID and was only giving him 1 pill in the AM and 1.5 pills in the PM as the pt was having symptoms of lethargy taking them with other medications.  Kathleen Argue stated that if she wanted to take pt back to original dosage of 0.3 mg daily by taking 1.5 pills in the AM and 1.5 pills in the PM if pt feels like he can tolerate it.  Pt and wife provided with information and no questions at this time. Pt reminded of upcoming appt  With Kathleen Argue on 02/06/15.

## 2015-01-10 NOTE — Patient Instructions (Signed)
Medication Instructions:  Your physician recommends that you continue on your current medications as directed. Please refer to the Current Medication list given to you today.  Labwork: NONE  Testing/Procedures: NONE  Follow-Up: Keep your follow-up appointments.  Any Other Special Instructions Will Be Listed Below (If Applicable).

## 2015-01-11 ENCOUNTER — Ambulatory Visit: Payer: Medicare Other | Admitting: Physical Therapy

## 2015-01-11 DIAGNOSIS — N3941 Urge incontinence: Secondary | ICD-10-CM | POA: Diagnosis not present

## 2015-01-15 ENCOUNTER — Ambulatory Visit: Payer: Medicare Other | Admitting: Physical Therapy

## 2015-01-17 ENCOUNTER — Ambulatory Visit: Payer: Medicare Other | Admitting: Physical Therapy

## 2015-01-22 DIAGNOSIS — L905 Scar conditions and fibrosis of skin: Secondary | ICD-10-CM | POA: Diagnosis not present

## 2015-01-22 DIAGNOSIS — D485 Neoplasm of uncertain behavior of skin: Secondary | ICD-10-CM | POA: Diagnosis not present

## 2015-02-05 NOTE — Progress Notes (Signed)
Cardiology Office Note   Date:  02/06/2015   ID:  Darryl [name], DOB Dec 22, 1934, MRN [id number]  PCP:  Tawanna Solo, MD  Cardiologist:  Dr. [name]     Chief Complaint  Patient presents with  . Follow-up    Hypertension  . Coronary Artery Disease     History of Present Illness: Darryl [name] is a 80 y.o. male with a hx of CAD status post CABG in 11/2003 and subsequent PCI to the radial graft to the OM with a DES and DES to the SVG-RCA in 04/2004 and BMS to LCx in 2010, LBBB, HTN, HL, CKD, PUD/gastritis, Parkinson's disease.     I saw him last month.  He was referred back by [facility].  BP was 173/108.  I adjusted his medications.  He returns for FU.    Here with his wife.  BPs at home have been ok for the most part.  He has had a few elevations in the afternoon.  He has occasional HAs.  No blurry vision. He denies chest pain, dyspnea, syncope, orthopnea, PND, edema.     Studies/Reports Reviewed Today:  Echo 05/01/14 Severe LVH, EF 08-65%, grade 1 diastolic dysfunction Borderline dilated aortic root and ascending aorta (Root 37 mm, ascending aorta 39 mm) MAC, mild MR Mild LAE Normal RV function PASP 29 mmHg  Carotid US 11/10/13 No significant ICA stenosis bilaterally  Myoview 04/18/13 No evidence for inducible ischemia Remote infarct involving the septum and inferior wall resulting in akinetic and dyskinetic segments EF 58%   Past Medical History  Diagnosis Date  . LBBB (left bundle branch block)   . Sleep disturbance   . Insomnia   . Tremor   . HTN (hypertension)     orthostatic  . Dyslipidemia   . Thrombocytopenia   . Colon polyp   . Diverticulosis   . Renal insufficiency     renal duplex scan in 2012 showed no renal artery stenosis  . Lumbar spondylolysis     multilevel DJD with mild spinal stenosis L3-L5  . Headache(784.0)   . Arthritis   . Thrombocytopenia   . Elevated fasting glucose   . Gastritis   . Hiatal hernia   .  Migraines   . H/O urinary retention     post surgical procedures; has foley cath  . Parkinson's disease   . Stroke     a. remote hx of stroke reportedly r/t HTN.  . Intracranial hemorrhage     a. small pontine [place] 2003.  [name] Peptic ulcer disease     a. prior hx of bleeding ulcers, including 12/2003 in setting of aspirin use.  . Gastritis     a. by EGD 04/2004.  [name] Hypertensive heart disease   . Dilated aortic root 05/29/2014  . UTI (urinary tract infection)   . CHF (congestive heart failure)     hx of diastolic chf w last admission  . Coronary atherosclerosis of native coronary artery     a. s/p CABG 11/2003. b. s/p DES to radial-OM and SVG-RCA 11/05. c. s/p BMS to LCx 11/2008.  [name] HOH (hard of hearing)     slight hoh, rt>l  . Wears glasses   . BPH (benign prostatic hypertrophy) with urinary retention     Past Surgical History  Procedure Laterality Date  . Wisdom tooth extraction    . Esophagogastroduodenoscopy      10/2001, 12/2003, 04/2004  . Coronary artery bypass graft  2005  .  Shoulder arthroscopy Left 9/10  . Appendectomy  62    1963  . Stones      hx  . Cholecystectomy  1998  . Lumbar laminectomy/decompression microdiscectomy N/A 05/03/2013    Procedure: LUMBAR LAMINECTOMY/DCOMPRESSION MICRODISCECTOMY RIGHT LUMBAR FOUR FIVE;  Surgeon: [name], MD;  Location: [facility];  Service: Neurosurgery;  Laterality: N/A;  . Colonoscopy  10/2001  . Colon surgery  03/14/2008  . Coronary angioplasty  6/10,10/05    Dr. [name] cardiologist: last office visit 11/07/14  . Transurethral resection of prostate N/A 11/19/2014    Procedure: TRANSURETHRAL RESECTION OF THE PROSTATE WITH GYRUS INSTRUMENTS;  Surgeon: [name], MD;  Location[facility];  Service: Urology;  Laterality: N/A;  . Cystoscopy with litholapaxy N/A 11/19/2014    Procedure: CYSTOSCOPY WITH LITHOLAPAXY;  Surgeon: [name], MD;  Location: [facility];  Service: Urology;   Laterality: N/A;     Current Outpatient Prescriptions  Medication Sig Dispense Refill  . acetaminophen (TYLENOL) 500 MG tablet Take 1,000 mg by mouth every 8 (eight) hours as needed for mild pain or moderate pain.    [name] aspirin EC 81 MG tablet Take 81 mg by mouth. Patient takes 3 times a week    . carvedilol (COREG) 6.25 MG tablet Take 1 tablet (6.25 mg total) by mouth 2 (two) times daily. 60 tablet 6  . citalopram (CELEXA) 20 MG tablet Take 20 mg by mouth daily.    . cloNIDine (CATAPRES) 0.2 MG tablet Take 0.2-0.3 mg by mouth 2 (two) times daily. Takes 1.5 tablet in am and 1.5 tablets in PM daily    . furosemide (LASIX) 20 MG tablet Take 1 tablet (20 mg total) by mouth daily. 90 tablet 3  . hydrALAZINE (APRESOLINE) 25 MG tablet Take 1 tablet (25 mg total) by mouth 3 (three) times daily. 270 tablet 3  . irbesartan (AVAPRO) 300 MG tablet Take 300 mg by mouth every morning.    . Meth-Hyo-M Bl-Na Phos-Ph Sal (URIBEL) 118 MG CAPS Take 1 tablet by mouth 2-3 times a day    . Omega-3 Fatty Acids (FISH OIL PO) Take 800 mg by mouth daily. Carlson's Liquid    . pantoprazole (PROTONIX) 40 MG tablet Take 40 mg by mouth every morning.    . potassium chloride (K-DUR) 10 MEQ tablet Take 1 tablet (10 mEq total) by mouth daily. 30 tablet 1  . traMADol (ULTRAM) 50 MG tablet Take 50 mg by mouth daily as needed (pain).      No current facility-administered medications for this visit.    Allergies:   Ambien; Aspirin; Celebrex; Nsaids; Toprol xl; Altace; Amlodipine; Levothyroxine; Biaxin; and Tetracyclines & related    Social History:  The patient  reports that he quit smoking about 55 years ago. His smoking use included Cigarettes. He has a 10 pack-year smoking history. He has never used smokeless tobacco. He reports that he does not drink alcohol or use illicit drugs.   Family History:  The patient's family history includes CAD in his sister; CAD (age of onset: 48) in his father.    ROS:   Please see the  history of present illness.   Review of Systems  All other systems reviewed and are negative.     PHYSICAL EXAM: VS:  BP 118/60 mmHg  Pulse 63  Ht 5' 8.5" (1.74 m)  Wt 199 lb 12.8 oz (90.629 kg)  BMI 29.93 kg/m2  Repeat BP by me 108/70  Wt Readings from Last 3 Encounters:  02/06/15 199 lb 12.8 oz (90.629 kg)  01/10/15 201 lb 6.4 oz (91.354 kg)  01/01/15 192 lb (87.091 kg)     GEN: Well nourished, well developed, in no acute distress HEENT: normal Neck: no JVD, no masses Cardiac:  Normal S1/S2, RRR; no murmur ,  no rubs or gallops, no edema   Respiratory:  clear to auscultation bilaterally, no wheezing, rhonchi or rales. GI: soft, nontender, nondistended, + BS MS: no deformity or atrophy Skin: warm and dry  Neuro:  CNs II-XII intact, Strength and sensation are intact Psych: Normal affect   EKG:  EKG is ordered today.  It demonstrates:   NSR, HR 63, LBBB   Recent Labs: 10/10/2014: B Natriuretic Peptide 878.5* 10/12/2014: Platelets 185 11/19/2014: Hemoglobin 13.6 12/06/2014: ALT 22; BUN 27*; Creatinine, Ser 1.46; Potassium 4.0; Sodium 137    Lipid Panel    Component Value Date/Time   CHOL 147 12/06/2014 0757   TRIG 201.0* 12/06/2014 0757   HDL 29.40* 12/06/2014 0757   CHOLHDL 5 12/06/2014 0757   VLDL 40.2* 12/06/2014 0757   LDLDIRECT 42.0 12/06/2014 0757      ASSESSMENT AND PLAN:  Hypertensive heart disease with heart failure:  Blood pressure better controlled.  If his afternoon BP continues to run high, he can change Clonidine to 0.2 mg TID.  Otherwise, continue current Rx.  Chronic diastolic CHF (congestive heart failure):  Volume appears stable. Continue current Rx.   Dilated aortic root  Aggressive BP control.  FU echo pending in 04/2015.    Coronary artery disease involving native coronary artery of native heart without angina pectoris:  No angina.  Continue ASA, beta-blocker.  CKD (chronic kidney disease), stage III:  Recent Creatinine  stable.  Dyslipidemia:  Statin intolerant.  Parkinson's disease:  He should be able to continue PT.  He is trying to do exercises at home for now.     Medication Changes: Current medicines are reviewed at length with the patient today.  Concerns regarding medicines are as outlined above.  The following changes have been made:   Discontinued Medications   No medications on file   Modified Medications   No medications on file   New Prescriptions   No medications on file    Labs/ tests ordered today include:   No orders of the defined types were placed in this encounter.     Disposition:   FU with Dr. [name] as planned.      Signed, Versie Starks, MHS 02/06/2015 11:49 AM    [facility] [place], [place], [place]  [address] Phone: [phone]; Fax: [phone]

## 2015-02-06 ENCOUNTER — Encounter: Payer: Self-pay | Admitting: Physician Assistant

## 2015-02-06 ENCOUNTER — Ambulatory Visit (INDEPENDENT_AMBULATORY_CARE_PROVIDER_SITE_OTHER): Payer: Medicare Other | Admitting: Physician Assistant

## 2015-02-06 VITALS — BP 118/60 | HR 63 | Ht 68.5 in | Wt 199.8 lb

## 2015-02-06 DIAGNOSIS — E785 Hyperlipidemia, unspecified: Secondary | ICD-10-CM

## 2015-02-06 DIAGNOSIS — I2510 Atherosclerotic heart disease of native coronary artery without angina pectoris: Secondary | ICD-10-CM | POA: Diagnosis not present

## 2015-02-06 DIAGNOSIS — I77810 Thoracic aortic ectasia: Secondary | ICD-10-CM | POA: Diagnosis not present

## 2015-02-06 DIAGNOSIS — I5032 Chronic diastolic (congestive) heart failure: Secondary | ICD-10-CM | POA: Diagnosis not present

## 2015-02-06 DIAGNOSIS — N183 Chronic kidney disease, stage 3 unspecified: Secondary | ICD-10-CM

## 2015-02-06 DIAGNOSIS — I110 Hypertensive heart disease with heart failure: Secondary | ICD-10-CM

## 2015-02-06 DIAGNOSIS — G20 Parkinson's disease: Secondary | ICD-10-CM

## 2015-02-06 DIAGNOSIS — I509 Heart failure, unspecified: Secondary | ICD-10-CM

## 2015-02-06 MED ORDER — POTASSIUM CHLORIDE ER 10 MEQ PO TBCR: 10.0000 meq | EXTENDED_RELEASE_TABLET | Freq: Every day | ORAL | Status: DC

## 2015-02-06 NOTE — Patient Instructions (Addendum)
Medication Instructions:  A REFILL WAS SENT IN FOR THE POTASSIUM TODAY  Labwork: None   Testing/Procedures: None   Follow-Up: Dr. [name] as planned in June 2017.  Any Other Special Instructions Will Be Listed Below (If Applicable).  If you start to notice the afternoon BP is getting very high (170 or higher - the top number), try changing the Clonidine to 0.2 mg one tablet every 8 hours.  Call us to let us know what his readings are with this (if you have to change).

## 2015-02-11 DIAGNOSIS — N3941 Urge incontinence: Secondary | ICD-10-CM | POA: Diagnosis not present

## 2015-02-11 DIAGNOSIS — N390 Urinary tract infection, site not specified: Secondary | ICD-10-CM | POA: Diagnosis not present

## 2015-02-27 ENCOUNTER — Encounter: Payer: Medicare Other | Admitting: Physical Therapy

## 2015-02-27 NOTE — Therapy (Signed)
[facility] [address] [place], [place], [address] Phone: [phone]   Fax:  [phone]  Patient Details  Name: SUFYAN MEIDINGER MRN: [id number] Date of Birth: [birth year] Referring Provider:  No ref. provider found  Encounter Date: 02/27/2015  PHYSICAL THERAPY DISCHARGE SUMMARY  Visits from Start of Care: 4 PT visits:  12/13/14-01/03/15 Current functional level related to goals / functional outcomes: Goals not able to be fully assessed due to pt not returning after 4th PT visit; pt not able to complete PT POC.   Remaining deficits: Balance, gait   Education / Equipment: Initiated HEP; educated in high blood pressure and CVA warning signs and risk factors due to 2 visits with pt experiencing high blood pressure.  After 2nd visit of high blood pressure outside of safe PT working parameters, pt requests to cancel remaining PT appointments.  Plan: Patient agrees to discharge.  Patient goals were not met. Patient is being discharged due to not returning since the last visit.  ?????      Tressy Kunzman W. 02/27/2015, 9:58 AM Frazier Butt., PT [facility] [address] [place], [place], [address] Phone: [phone]   Fax:  [phone]

## 2015-03-27 DIAGNOSIS — Z23 Encounter for immunization: Secondary | ICD-10-CM | POA: Diagnosis not present

## 2015-04-19 DIAGNOSIS — J209 Acute bronchitis, unspecified: Secondary | ICD-10-CM | POA: Diagnosis not present

## 2015-05-15 ENCOUNTER — Ambulatory Visit (HOSPITAL_COMMUNITY): Payer: Medicare Other | Attending: Internal Medicine

## 2015-05-15 ENCOUNTER — Other Ambulatory Visit: Payer: Self-pay

## 2015-05-15 DIAGNOSIS — I071 Rheumatic tricuspid insufficiency: Secondary | ICD-10-CM | POA: Diagnosis not present

## 2015-05-15 DIAGNOSIS — I358 Other nonrheumatic aortic valve disorders: Secondary | ICD-10-CM | POA: Insufficient documentation

## 2015-05-15 DIAGNOSIS — I10 Essential (primary) hypertension: Secondary | ICD-10-CM

## 2015-05-15 DIAGNOSIS — I517 Cardiomegaly: Secondary | ICD-10-CM | POA: Insufficient documentation

## 2015-05-15 DIAGNOSIS — I77810 Thoracic aortic ectasia: Secondary | ICD-10-CM | POA: Diagnosis not present

## 2015-05-20 ENCOUNTER — Telehealth: Payer: Self-pay

## 2015-05-20 DIAGNOSIS — R931 Abnormal findings on diagnostic imaging of heart and coronary circulation: Secondary | ICD-10-CM

## 2015-05-20 NOTE — Telephone Encounter (Signed)
-----   Message from Sueanne Margarita, MD sent at 05/18/2015  7:04 PM EST ----- Echo showed normal LVF with moderate to severe LVF with speckled pattern and normal LVF.  Mildly dilated aortic root.  Please get Cardiac MRI with Gad to evaluate for infiltrated CM

## 2015-05-20 NOTE — Telephone Encounter (Signed)
Informed patient of results and verbal understanding expressed.  Cardiac MRI ordered for scheduling Patient agrees with treatment plan. 

## 2015-05-22 ENCOUNTER — Encounter: Payer: Self-pay | Admitting: Cardiology

## 2015-05-29 DIAGNOSIS — I10 Essential (primary) hypertension: Secondary | ICD-10-CM | POA: Diagnosis not present

## 2015-05-29 DIAGNOSIS — Z6832 Body mass index (BMI) 32.0-32.9, adult: Secondary | ICD-10-CM | POA: Diagnosis not present

## 2015-05-29 DIAGNOSIS — M7061 Trochanteric bursitis, right hip: Secondary | ICD-10-CM | POA: Diagnosis not present

## 2015-06-10 ENCOUNTER — Ambulatory Visit (HOSPITAL_COMMUNITY)
Admission: RE | Admit: 2015-06-10 | Discharge: 2015-06-10 | Disposition: A | Discharge status: Home / Self Care | Payer: Medicare Other | Source: Ambulatory Visit | Attending: Cardiology | Admitting: Cardiology

## 2015-06-10 DIAGNOSIS — R931 Abnormal findings on diagnostic imaging of heart and coronary circulation: Secondary | ICD-10-CM | POA: Insufficient documentation

## 2015-06-10 DIAGNOSIS — I517 Cardiomegaly: Secondary | ICD-10-CM | POA: Diagnosis not present

## 2015-06-10 DIAGNOSIS — I371 Nonrheumatic pulmonary valve insufficiency: Secondary | ICD-10-CM | POA: Diagnosis not present

## 2015-06-10 DIAGNOSIS — I071 Rheumatic tricuspid insufficiency: Secondary | ICD-10-CM | POA: Diagnosis not present

## 2015-06-10 DIAGNOSIS — I340 Nonrheumatic mitral (valve) insufficiency: Secondary | ICD-10-CM | POA: Diagnosis not present

## 2015-06-10 LAB — CREATININE, SERUM
Creatinine, Ser: 1.43 mg/dL — ABNORMAL HIGH (ref 0.61–1.24)
GFR calc Af Amer: 53 mL/min — ABNORMAL LOW (ref >60)
GFR, EST NON AFRICAN AMERICAN: 45 mL/min — AB (ref >60)

## 2015-06-10 MED ORDER — GADOBENATE DIMEGLUMINE 529 MG/ML IV SOLN
30.0000 mL | Freq: Once | INTRAVENOUS | Status: AC | PRN
  Administered 2015-06-10: 30 mL via INTRAVENOUS

## 2015-06-28 ENCOUNTER — Other Ambulatory Visit: Payer: Self-pay | Admitting: *Deleted

## 2015-06-28 DIAGNOSIS — I110 Hypertensive heart disease with heart failure: Secondary | ICD-10-CM

## 2015-06-28 MED ORDER — CARVEDILOL 6.25 MG PO TABS: 6.2500 mg | ORAL_TABLET | Freq: Two times a day (BID) | ORAL | Status: DC

## 2015-07-22 ENCOUNTER — Other Ambulatory Visit: Payer: Self-pay | Admitting: Physician Assistant

## 2015-08-05 DIAGNOSIS — R972 Elevated prostate specific antigen [PSA]: Secondary | ICD-10-CM | POA: Diagnosis not present

## 2015-08-05 DIAGNOSIS — Z0000 Encounter for general adult medical examination without abnormal findings: Secondary | ICD-10-CM | POA: Diagnosis not present

## 2015-08-05 DIAGNOSIS — R351 Nocturia: Secondary | ICD-10-CM | POA: Diagnosis not present

## 2015-08-07 ENCOUNTER — Other Ambulatory Visit: Payer: Self-pay | Admitting: *Deleted

## 2015-08-07 NOTE — Patient Outreach (Signed)
Called for [facility] HIGH RISK Screening. Spoke with Darryl [name], pt HCPOA. I told her of our free care mangement services and she said they were about to go out of town. She will discuss with her husband and call me back when they return.  I am putting them on my schedule to call back next week to follow up.  Darryl [name] [facility] [phone]

## 2015-08-16 ENCOUNTER — Other Ambulatory Visit: Payer: Self-pay | Admitting: *Deleted

## 2015-08-19 ENCOUNTER — Encounter: Payer: Self-pay | Admitting: *Deleted

## 2015-08-19 NOTE — Patient Outreach (Signed)
Darryl [name] returned my call and we discussed our services. She is Darryl [name] primary care giver. She says her husband has a memory problem and she supervises everything.  I am going to send them our information and call them back in 10 days.  Darryl [name] [facility] [phone]

## 2015-08-21 DIAGNOSIS — Z79899 Other long term (current) drug therapy: Secondary | ICD-10-CM | POA: Diagnosis not present

## 2015-08-28 DIAGNOSIS — H6123 Impacted cerumen, bilateral: Secondary | ICD-10-CM | POA: Diagnosis not present

## 2015-08-28 DIAGNOSIS — E782 Mixed hyperlipidemia: Secondary | ICD-10-CM | POA: Diagnosis not present

## 2015-08-28 DIAGNOSIS — M4727 Other spondylosis with radiculopathy, lumbosacral region: Secondary | ICD-10-CM | POA: Diagnosis not present

## 2015-08-28 DIAGNOSIS — I2510 Atherosclerotic heart disease of native coronary artery without angina pectoris: Secondary | ICD-10-CM | POA: Diagnosis not present

## 2015-08-28 DIAGNOSIS — E669 Obesity, unspecified: Secondary | ICD-10-CM | POA: Diagnosis not present

## 2015-08-28 DIAGNOSIS — G20 Parkinson's disease: Secondary | ICD-10-CM | POA: Diagnosis not present

## 2015-08-28 DIAGNOSIS — I10 Essential (primary) hypertension: Secondary | ICD-10-CM | POA: Diagnosis not present

## 2015-08-28 DIAGNOSIS — Z6830 Body mass index (BMI) 30.0-30.9, adult: Secondary | ICD-10-CM | POA: Diagnosis not present

## 2015-08-28 DIAGNOSIS — F411 Generalized anxiety disorder: Secondary | ICD-10-CM | POA: Diagnosis not present

## 2015-08-28 DIAGNOSIS — N189 Chronic kidney disease, unspecified: Secondary | ICD-10-CM | POA: Diagnosis not present

## 2015-08-28 DIAGNOSIS — Z0000 Encounter for general adult medical examination without abnormal findings: Secondary | ICD-10-CM | POA: Diagnosis not present

## 2015-08-28 DIAGNOSIS — Z87891 Personal history of nicotine dependence: Secondary | ICD-10-CM | POA: Diagnosis not present

## 2015-08-30 ENCOUNTER — Other Ambulatory Visit: Payer: Self-pay | Admitting: *Deleted

## 2015-08-30 NOTE — Patient Outreach (Signed)
Darryl [name] answered the phone but could not tell me if he had reviewed the information sent to he and his wife. Darryl [name] had informed me on my last call that Darryl [name] does have some memory problems. I asked if his wife was home and he said, "No, can you call back this afternoon?' I told him I would try to do that.  Deloria Lair [facility] [phone]

## 2015-09-04 DIAGNOSIS — Z6832 Body mass index (BMI) 32.0-32.9, adult: Secondary | ICD-10-CM | POA: Diagnosis not present

## 2015-09-04 DIAGNOSIS — I10 Essential (primary) hypertension: Secondary | ICD-10-CM | POA: Diagnosis not present

## 2015-09-04 DIAGNOSIS — M5416 Radiculopathy, lumbar region: Secondary | ICD-10-CM | POA: Diagnosis not present

## 2015-09-09 ENCOUNTER — Other Ambulatory Visit: Payer: Self-pay | Admitting: *Deleted

## 2015-09-11 ENCOUNTER — Other Ambulatory Visit: Payer: Self-pay | Admitting: *Deleted

## 2015-09-11 NOTE — Patient Outreach (Signed)
Follow up screening call for [place] services. I spoke to Mrs. Manera, who is pt's fulltime caregiver. He has memory problems. He is also obese and Mrs. Chee has received some information to help her fine tune her menu that she provides to impact his wieght loss needs. She denies that he has any needs that we can meet at this time.  Deloria Lair [facility] [phone]

## 2015-10-07 ENCOUNTER — Other Ambulatory Visit: Payer: Self-pay | Admitting: *Deleted

## 2015-10-07 MED ORDER — FUROSEMIDE 20 MG PO TABS: 20.0000 mg | ORAL_TABLET | Freq: Every day | ORAL | Status: DC

## 2015-11-09 DIAGNOSIS — L03115 Cellulitis of right lower limb: Secondary | ICD-10-CM | POA: Diagnosis not present

## 2015-11-09 DIAGNOSIS — S80861A Insect bite (nonvenomous), right lower leg, initial encounter: Secondary | ICD-10-CM | POA: Diagnosis not present

## 2015-11-27 DIAGNOSIS — Z6832 Body mass index (BMI) 32.0-32.9, adult: Secondary | ICD-10-CM | POA: Diagnosis not present

## 2015-11-27 DIAGNOSIS — M47817 Spondylosis without myelopathy or radiculopathy, lumbosacral region: Secondary | ICD-10-CM | POA: Diagnosis not present

## 2015-11-27 DIAGNOSIS — I10 Essential (primary) hypertension: Secondary | ICD-10-CM | POA: Diagnosis not present

## 2015-12-02 ENCOUNTER — Ambulatory Visit (INDEPENDENT_AMBULATORY_CARE_PROVIDER_SITE_OTHER): Payer: Medicare Other | Admitting: Cardiology

## 2015-12-02 ENCOUNTER — Encounter: Payer: Self-pay | Admitting: Cardiology

## 2015-12-02 VITALS — BP 130/82 | HR 77 | Ht 68.5 in | Wt 199.1 lb

## 2015-12-02 DIAGNOSIS — I77810 Thoracic aortic ectasia: Secondary | ICD-10-CM | POA: Diagnosis not present

## 2015-12-02 DIAGNOSIS — I110 Hypertensive heart disease with heart failure: Secondary | ICD-10-CM

## 2015-12-02 DIAGNOSIS — E785 Hyperlipidemia, unspecified: Secondary | ICD-10-CM

## 2015-12-02 DIAGNOSIS — I119 Hypertensive heart disease without heart failure: Secondary | ICD-10-CM

## 2015-12-02 DIAGNOSIS — I5032 Chronic diastolic (congestive) heart failure: Secondary | ICD-10-CM | POA: Diagnosis not present

## 2015-12-02 DIAGNOSIS — I2510 Atherosclerotic heart disease of native coronary artery without angina pectoris: Secondary | ICD-10-CM | POA: Diagnosis not present

## 2015-12-02 MED ORDER — KLOR-CON 10 10 MEQ PO TBCR: 10.0000 meq | EXTENDED_RELEASE_TABLET | Freq: Every day | ORAL | Status: DC

## 2015-12-02 MED ORDER — CARVEDILOL 6.25 MG PO TABS: 6.2500 mg | ORAL_TABLET | Freq: Two times a day (BID) | ORAL | Status: DC

## 2015-12-02 MED ORDER — HYDRALAZINE HCL 25 MG PO TABS: 25.0000 mg | ORAL_TABLET | Freq: Three times a day (TID) | ORAL | Status: DC

## 2015-12-02 MED ORDER — FUROSEMIDE 20 MG PO TABS: 20.0000 mg | ORAL_TABLET | Freq: Every day | ORAL | Status: DC

## 2015-12-02 NOTE — Patient Instructions (Signed)
Medication Instructions:  Your physician recommends that you continue on your current medications as directed. Please refer to the Current Medication list given to you today.   Labwork: None  Testing/Procedures: Your physician has requested that you have an echocardiogram in November, 2017. Echocardiography is a painless test that uses sound waves to create images of your heart. It provides your doctor with information about the size and shape of your heart and how well your heart's chambers and valves are working. This procedure takes approximately one hour. There are no restrictions for this procedure.  Follow-Up: Your physician wants you to follow-up in: 6 months with Dr. Radford Pax. You will receive a reminder letter in the mail two months in advance. If you don't receive a letter, please call our office to schedule the follow-up appointment.   Any Other Special Instructions Will Be Listed Below (If Applicable).     If you need a refill on your cardiac medications before your next appointment, please call your pharmacy.

## 2015-12-02 NOTE — Progress Notes (Signed)
Cardiology Office Note    Date:  12/02/2015   ID:  Darryl [name], DOB 1934-10-30, MRN [id number]  PCP:  Tawanna Solo, MD  Cardiologist:  [name], MD   No chief complaint on file.   History of Present Illness:  Darryl [name] is a 81 y.o. male with a history of CAD/HTN/chronic LBBB/dyslipidemia and chronic diastolic CHF who presents today for followup of his HTN.  He denies any chest pain, LE edema, dizziness, palpitations or syncope. He has some DOE and it is intermittent and does not occur daily and is stable. He says it does not occur often. He walks for exercise but is limited by arthritic back problems and his Parkinsons.   Past Medical History  Diagnosis Date  . LBBB (left bundle branch block)   . Sleep disturbance   . Insomnia   . Tremor   . HTN (hypertension)   . Dyslipidemia   . Thrombocytopenia ([place])   . Colon polyp   . Diverticulosis   . Renal insufficiency     renal duplex scan in 2012 showed no renal artery stenosis  . Lumbar spondylolysis     multilevel DJD with mild spinal stenosis L3-L5  . Headache(784.0)   . Arthritis   . Elevated fasting glucose   . Gastritis   . Hiatal hernia   . Migraines   . H/O urinary retention     post surgical procedures; has foley cath  . Parkinson's disease ([place])   . Stroke [facility])     a. remote hx of stroke reportedly r/t HTN.  . Intracranial hemorrhage ([place])     a. small pontine [place] 2003.  [name] Peptic ulcer disease     a. prior hx of bleeding ulcers, including 12/2003 in setting of aspirin use.  . Gastritis     a. by EGD 04/2004.  [name] Hypertensive heart disease   . Dilated aortic root ([place]) 05/29/2014  . UTI (urinary tract infection)   . Chronic diastolic CHF (congestive heart failure) ([place])   . Coronary atherosclerosis of native coronary artery     a. s/p CABG 11/2003. b. s/p DES to radial-OM and SVG-RCA 11/05. c. s/p BMS to LCx 11/2008.  [name] HOH (hard of hearing)     slight hoh, rt>l  . Wears glasses   .  BPH (benign prostatic hypertrophy) with urinary retention     Past Surgical History  Procedure Laterality Date  . Wisdom tooth extraction    . Esophagogastroduodenoscopy      10/2001, 12/2003, 04/2004  . Coronary artery bypass graft  2005  . Shoulder arthroscopy Left 9/10  . Appendectomy  62    1963  . Stones      hx  . Cholecystectomy  1998  . Lumbar laminectomy/decompression microdiscectomy N/A 05/03/2013    Procedure: LUMBAR LAMINECTOMY/DCOMPRESSION MICRODISCECTOMY RIGHT LUMBAR FOUR FIVE;  Surgeon: [name], MD;  Location: [facility];  Service: Neurosurgery;  Laterality: N/A;  . Colonoscopy  10/2001  . Colon surgery  03/14/2008  . Coronary angioplasty  6/10,10/05    Dr. [name] cardiologist: last office visit 11/07/14  . Transurethral resection of prostate N/A 11/19/2014    Procedure: TRANSURETHRAL RESECTION OF THE PROSTATE WITH GYRUS INSTRUMENTS;  Surgeon: [name], MD;  [facility];  Service: Urology;  Laterality: N/A;  . Cystoscopy with litholapaxy N/A 11/19/2014    Procedure: CYSTOSCOPY WITH LITHOLAPAXY;  Surgeon: [name], MD;  Location: [facility];  Service: Urology;  Laterality: N/A;    Current Medications: Outpatient Prescriptions Prior to Visit  Medication Sig Dispense Refill  . acetaminophen (TYLENOL) 500 MG tablet Take 1,000 mg by mouth every 8 (eight) hours as needed for mild pain or moderate pain.    [name] aspirin EC 81 MG tablet Take 81 mg by mouth. Patient takes 3 times a week    . carvedilol (COREG) 6.25 MG tablet Take 1 tablet (6.25 mg total) by mouth 2 (two) times daily. 90 tablet 1  . cloNIDine (CATAPRES) 0.2 MG tablet Take 0.2-0.3 mg by mouth 2 (two) times daily. Takes 1.5 tablet in am and 1.5 tablets in PM daily    . furosemide (LASIX) 20 MG tablet Take 1 tablet (20 mg total) by mouth daily. 90 tablet 1  . hydrALAZINE (APRESOLINE) 25 MG tablet Take 1 tablet (25 mg total) by mouth 3 (three) times daily. 270  tablet 3  . irbesartan (AVAPRO) 300 MG tablet Take 300 mg by mouth every morning.    . Omega-3 Fatty Acids (FISH OIL PO) Take 800 mg by mouth daily. Carlson's Liquid    . pantoprazole (PROTONIX) 40 MG tablet Take 40 mg by mouth as directed.     . potassium chloride (K-DUR) 10 MEQ tablet Take 1 tablet (10 mEq total) by mouth daily. 90 tablet 3  . traMADol (ULTRAM) 50 MG tablet Take 50 mg by mouth daily as needed (pain).     . carvedilol (COREG) 6.25 MG tablet TAKE 1 TABLET (6.25 MG TOTAL) BY MOUTH 2 (TWO) TIMES DAILY. 60 tablet 3  . citalopram (CELEXA) 20 MG tablet Take 20 mg by mouth daily.    . Meth-Hyo-M Bl-Na Phos-Ph Sal (URIBEL) 118 MG CAPS Take 1 tablet by mouth 2-3 times a day     No facility-administered medications prior to visit.     Allergies:   Ambien; Aspirin; Celebrex; Nsaids; Toprol xl; Altace; Amlodipine; Levothyroxine; Biaxin; and Tetracyclines & related   Social History   Social History  . Marital Status: Married    Spouse Name: [name]  . Number of Children: 4  . Years of Education: [employer]   Social History Main Topics  . Smoking status: Former Smoker -- 2.00 packs/day for 5 years    Types: Cigarettes    Quit date: 04/28/1959  . Smokeless tobacco: Never Used     Comment: 55+ yrs ago  . Alcohol Use: No  . Drug Use: No  . Sexual Activity: Not Asked   Other Topics Concern  . None   Social History Narrative   Patient is married to [name]), has 4 children   Patient is right handed   Education level is college   Caffeine consumption is none     Family History:  The patient's family history includes CAD in his sister; CAD (age of onset: 25) in his father.   ROS:   Please see the history of present illness.    ROS All other systems reviewed and are negative.   PHYSICAL EXAM:   VS:  BP 130/82 mmHg  Pulse 77  Ht 5' 8.5" (1.74 m)  Wt 199 lb 1.9 oz (90.32 kg)  BMI 29.83 kg/m2  SpO2 96%   GEN: Well nourished, well developed, in no acute distress HEENT:  normal Neck: no JVD, carotid bruits, or masses Cardiac: RRR; no murmurs, rubs, or gallops,no edema.  Intact distal pulses bilaterally.  Respiratory:  clear to auscultation bilaterally, normal work of breathing GI: soft, nontender, nondistended, + BS MS: no deformity  or atrophy Skin: warm and dry, no rash Neuro:  Alert and Oriented x 3, Strength and sensation are intact Psych: euthymic mood, full affect  Wt Readings from Last 3 Encounters:  12/02/15 199 lb 1.9 oz (90.32 kg)  02/06/15 199 lb 12.8 oz (90.629 kg)  01/10/15 201 lb 6.4 oz (91.354 kg)      Studies/Labs Reviewed:   EKG:  EKG is not ordered today.    Recent Labs: 12/06/2014: ALT 22; BUN 27*; Potassium 4.0; Sodium 137 06/10/2015: Creatinine, Ser 1.43*   Lipid Panel    Component Value Date/Time   CHOL 147 12/06/2014 0757   TRIG 201.0* 12/06/2014 0757   HDL 29.40* 12/06/2014 0757   CHOLHDL 5 12/06/2014 0757   VLDL 40.2* 12/06/2014 0757   LDLDIRECT 42.0 12/06/2014 0757    Additional studies/ records that were reviewed today include:  none    ASSESSMENT:    1. Coronary artery disease involving native coronary artery of native heart without angina pectoris   2. Dilated aortic root ([place])   3. Dyslipidemia   4. Chronic diastolic heart failure ([place])   5. Hypertensive heart disease without heart failure      PLAN:  In order of problems listed above:  1. ASCAD with no angina.  Continue ASA/BB. 2. Dilated aortic root - repeat echo in 1 year 3. Dyslipidemia - LDL goal is < 70.  LDL was 42 a year ago.  He is statin intolerant.   4. Chronic diastolic CHF - he is euvolemic on exam today.  Continue BB/ARB/Lasix. 5. HTN - BP controlled on current meds.  Continue BB/catapress/hydralazine/ARB.    Medication Adjustments/Labs and Tests Ordered: Current medicines are reviewed at length with the patient today.  Concerns regarding medicines are outlined above.  Medication changes, Labs and Tests ordered today are listed in  the Patient Instructions below.  There are no Patient Instructions on file for this visit.   Signed, [name], MD  12/02/2015 9:31 AM    [facility] [place], [place], [place]  [address] Phone: [phone]; Fax: [phone]

## 2015-12-10 ENCOUNTER — Encounter: Payer: Self-pay | Admitting: Cardiology

## 2015-12-23 ENCOUNTER — Telehealth: Payer: Self-pay | Admitting: Cardiology

## 2015-12-23 MED ORDER — HYDRALAZINE HCL 25 MG PO TABS: 25.0000 mg | ORAL_TABLET | Freq: Four times a day (QID) | ORAL | Status: DC

## 2015-12-23 NOTE — Telephone Encounter (Signed)
New Message:   Please call asap, pt's blood pressure is up real high.

## 2015-12-23 NOTE — Telephone Encounter (Signed)
The pts wife, Darryl [name], states that the pt was sitting outside this afternoon when she returned home. She states that he c/o "a funny feeling in his chest". They went inside and she checked his BP:    1:45 BP was 220/131  HR of 68 bpm  2:00 BP was 190/108  HR of 65 bpm 2:15 BP was 171/103  HR of 63 bpm 2:40 BP was  171/96   HR of 63 bpm.  Darryl [name] reports that the pt said that he had a funny feeling across his shoulders, like across his breast bone but that he has no c/o at this time. She states that he denies nausea, sweating, SOB, dizziness or CP.  She is advised not to check his BP anymore until I call her back in 1 hour and that if his BP is elevated at that time I will discuss with our DOD. She verbalized understanding and is in agreement with plan.

## 2015-12-23 NOTE — Telephone Encounter (Signed)
**Note De-Identified  Obfuscation** I called Darryl [name] back to see how the pt is doing and she stated that he is feeling better at this time. I asked her to check his BP while we were on the phone and she did. His BP is now 176/104 with a HR of 60. She is advised that I am going to discuss with our DOD and call her back. She verbalized understanding.

## 2015-12-23 NOTE — Telephone Encounter (Signed)
Per Truitt Merle, NP the pts wife, [name], is advised to increase the pts Hydralazine 25 mg to four times daily from TID and to call Valetta Fuller, Dr Landis Gandy nurse, in 1 week to report how the pt is doing. She verbalized understanding and is in agreement with plan.  Hydralazine dose updated in the pts chart.

## 2015-12-24 ENCOUNTER — Telehealth: Payer: Self-pay | Admitting: Cardiology

## 2015-12-24 NOTE — Telephone Encounter (Signed)
Patient's wife called concerned because patient's BP is not decreasing as she'd like since changes made yesterday. She took his BP at 0500 (prior to medications) and it was 176/118. She did not give readings for any BP taken after medication, but states it was "still high." Instructed her to have patient take medications as directed tomorrow and take BP 2 hours after. Scheduled patient Thursday in the HTN Clinic for evaluation per [facility] request. She understands to bring BP readings to appointment.

## 2015-12-24 NOTE — Telephone Encounter (Signed)
Follow-up     The pt needs to speak with the nurse

## 2015-12-24 NOTE — Telephone Encounter (Signed)
Follow-up     The wife is calling back to speak with the nurse regarding the pt please call cell phone

## 2015-12-26 ENCOUNTER — Ambulatory Visit (INDEPENDENT_AMBULATORY_CARE_PROVIDER_SITE_OTHER): Payer: Medicare Other | Admitting: Pharmacist

## 2015-12-26 VITALS — BP 144/86 | HR 71

## 2015-12-26 DIAGNOSIS — I2510 Atherosclerotic heart disease of native coronary artery without angina pectoris: Secondary | ICD-10-CM | POA: Diagnosis not present

## 2015-12-26 DIAGNOSIS — I119 Hypertensive heart disease without heart failure: Secondary | ICD-10-CM

## 2015-12-26 MED ORDER — NITROGLYCERIN 0.4 MG SL SUBL: 0.4000 mg | SUBLINGUAL_TABLET | SUBLINGUAL | Status: DC | PRN

## 2015-12-26 NOTE — Progress Notes (Signed)
Patient ID: Darryl [name]                 DOB: Mar 29, 1935                      MRN: [id number]     HPI: Darryl [name] is a 81 y.o. male referred by Dr. Radford Pax to HTN clinic.  He was last seen on 12/02/15 and at that visit his BP was controlled at 130/82.  He has a PMH significant for CAD, HTN, chronic LBBB, HLD, and chronic diastolic HF.  No changes were made to his medications at that visit.  On 6/26, his wife called to report his BP was elevated at [id number] systolic and pt had a "funny feeling in his chest."  Pt was advised to increase his hydralazine to 25mg  QID from TID and call back in 1 week.  His wife called back on 6/27 to say there were no changes in her husband's BP with the change in hydralazine.  An appt was made today for patient to come in for evaluation.   Pt was here today with his wife.  His BP was better at home this morning- 136/96 2 hours after he took his medication.  He states he has not had any other chest discomfort.  No swelling, weight stable but some SOB.  He also states his medications wipe him out in the mornings.   Current HTN meds: carvedilol 6.25mg  BID, clonidine 0.3mg  BID, furosemide 20mg  daily, hydralazine 25mg  QID, irbesartan 300mg  daily Previously tried: metoprolol (swelling), ramipril (unknown reaction), amlodipine (swelling) BP goal: <140/90  Diet: Pt had ham for dinner on Sunday and wife thinks this may be some of the reason for his higher BP on Monday  Home BP readings:  Wife was checking his BP multiple times a day at home.  She has decreased this and only doing it once a day now.  She did not bring the cuff for verification today.   Wt Readings from Last 3 Encounters:  12/02/15 199 lb 1.9 oz (90.32 kg)  02/06/15 199 lb 12.8 oz (90.629 kg)  01/10/15 201 lb 6.4 oz (91.354 kg)   BP Readings from Last 3 Encounters:  12/02/15 130/82  02/06/15 118/60  01/10/15 140/92   Pulse Readings from Last 3 Encounters:  12/02/15 77  02/06/15 63  01/10/15 71     Renal function: CrCl cannot be calculated (Unknown ideal weight.).  Past Medical History  Diagnosis Date  . LBBB (left bundle branch block)   . Sleep disturbance   . Insomnia   . Tremor   . HTN (hypertension)   . Dyslipidemia     statin intolerant  . Thrombocytopenia ([place])   . Colon polyp   . Diverticulosis   . Renal insufficiency     renal duplex scan in 2012 showed no renal artery stenosis  . Lumbar spondylolysis     multilevel DJD with mild spinal stenosis L3-L5  . Headache(784.0)   . Arthritis   . Elevated fasting glucose   . Gastritis   . Hiatal hernia   . Migraines   . H/O urinary retention     post surgical procedures; has foley cath  . Parkinson's disease ([place])   . Stroke [facility])     a. remote hx of stroke reportedly r/t HTN.  . Intracranial hemorrhage ([place])     a. small pontine [place] 2003.  [name] Peptic ulcer disease     a. prior hx of  bleeding ulcers, including 12/2003 in setting of aspirin use.  . Gastritis     a. by EGD 04/2004.  [name] Hypertensive heart disease   . Dilated aortic root ([place]) 05/29/2014  . UTI (urinary tract infection)   . Chronic diastolic CHF (congestive heart failure) ([place])   . Coronary atherosclerosis of native coronary artery     a. s/p CABG 11/2003. b. s/p DES to radial-OM and SVG-RCA 11/05. c. s/p BMS to LCx 11/2008.  [name] HOH (hard of hearing)     slight hoh, rt>l  . Wears glasses   . BPH (benign prostatic hypertrophy) with urinary retention     Current Outpatient Prescriptions on File Prior to Visit  Medication Sig Dispense Refill  . acetaminophen (TYLENOL) 500 MG tablet Take 1,000 mg by mouth every 8 (eight) hours as needed for mild pain or moderate pain.    [name] aspirin EC 81 MG tablet Take 81 mg by mouth. Patient takes 3 times a week    . carvedilol (COREG) 6.25 MG tablet Take 1 tablet (6.25 mg total) by mouth 2 (two) times daily. 90 tablet 3  . citalopram (CELEXA) 20 MG tablet Take 20 mg by mouth daily.    . cloNIDine (CATAPRES) 0.2 MG  tablet Take 0.2-0.3 mg by mouth 2 (two) times daily. Takes 1.5 tablet in am and 1.5 tablets in PM daily    . furosemide (LASIX) 20 MG tablet Take 1 tablet (20 mg total) by mouth daily. 90 tablet 3  . hydrALAZINE (APRESOLINE) 25 MG tablet Take 1 tablet (25 mg total) by mouth 4 (four) times daily. 360 tablet 3  . irbesartan (AVAPRO) 300 MG tablet Take 300 mg by mouth every morning.    [name] KLOR-CON 10 10 MEQ tablet Take 1 tablet (10 mEq total) by mouth daily. 90 tablet 3  . Omega-3 Fatty Acids (FISH OIL PO) Take 800 mg by mouth daily. Carlson's Liquid    . pantoprazole (PROTONIX) 40 MG tablet Take 40 mg by mouth as directed.     . traMADol (ULTRAM) 50 MG tablet Take 50 mg by mouth daily as needed (pain).      No current facility-administered medications on file prior to visit.    Allergies  Allergen Reactions  . Ambien [Zolpidem Tartrate] Other (See Comments)    hallucinations  . Aspirin Other (See Comments)    ulcers  . Celebrex [Celecoxib] Other (See Comments)    Due to ulcer.  . Nsaids Other (See Comments)    Due to ulcer.  . Toprol Xl [Metoprolol Tartrate] Swelling  . Altace [Ramipril]     UNKNOWN  . Amlodipine     Feet swell  . Levothyroxine Other (See Comments)    "made him feel bad"  . Biaxin [Clarithromycin] Nausea Only  . Tetracyclines & Related Nausea Only     Assessment/Plan: 1.  Hypertension- Pt's home BP reading and office reading were much improved today.  All of his previous office readings have been in range.  Will continue current medications.  Although pt feels drained after he takes his medications, they are not interested in trying to adjust any of them at this moment.  She will call if BP is elevated again.   2. Chest pain- Pt had one issues of pain across the top of his chest this week.  States it was different than with his heart attack.  He does not have any NTG at home.  Will send in new Rx.  Instructed on appropriate use  and to call us if he has any more  chest pain.

## 2016-01-06 DIAGNOSIS — L821 Other seborrheic keratosis: Secondary | ICD-10-CM | POA: Diagnosis not present

## 2016-01-06 DIAGNOSIS — D1801 Hemangioma of skin and subcutaneous tissue: Secondary | ICD-10-CM | POA: Diagnosis not present

## 2016-01-06 DIAGNOSIS — L570 Actinic keratosis: Secondary | ICD-10-CM | POA: Diagnosis not present

## 2016-01-06 DIAGNOSIS — D692 Other nonthrombocytopenic purpura: Secondary | ICD-10-CM | POA: Diagnosis not present

## 2016-01-06 DIAGNOSIS — L03011 Cellulitis of right finger: Secondary | ICD-10-CM | POA: Diagnosis not present

## 2016-01-17 DIAGNOSIS — R58 Hemorrhage, not elsewhere classified: Secondary | ICD-10-CM | POA: Diagnosis not present

## 2016-01-17 DIAGNOSIS — R1032 Left lower quadrant pain: Secondary | ICD-10-CM | POA: Diagnosis not present

## 2016-01-17 DIAGNOSIS — K5792 Diverticulitis of intestine, part unspecified, without perforation or abscess without bleeding: Secondary | ICD-10-CM | POA: Diagnosis not present

## 2016-01-20 DIAGNOSIS — K5792 Diverticulitis of intestine, part unspecified, without perforation or abscess without bleeding: Secondary | ICD-10-CM | POA: Diagnosis not present

## 2016-02-11 ENCOUNTER — Telehealth: Payer: Self-pay | Admitting: Cardiology

## 2016-02-11 NOTE — Telephone Encounter (Signed)
Per Gay Filler RPH's note, "Current HTN meds: carvedilol 6.25mg  BID, clonidine 0.3mg  BID, furosemide 20mg  daily, hydralazine 25mg  QID, irbesartan 300mg  daily"  Confirmed with patient's wife he is to take Hydralazine 25 mg 4 times daily. She was grateful for call.

## 2016-02-11 NOTE — Telephone Encounter (Signed)
New message     Pt c/o medication issue:  1. Name of Medication: Hydralizine   2. How are you currently taking this medication (dosage and times per day)? unsure  3. Are you having a reaction (difficulty breathing--STAT)? no  4. What is your medication issue? Pt wife is unsure of the dosages she gives it too him 4x a day

## 2016-02-11 NOTE — Telephone Encounter (Signed)
Left message to call back  

## 2016-02-19 DIAGNOSIS — M5416 Radiculopathy, lumbar region: Secondary | ICD-10-CM | POA: Diagnosis not present

## 2016-02-19 DIAGNOSIS — M5116 Intervertebral disc disorders with radiculopathy, lumbar region: Secondary | ICD-10-CM | POA: Diagnosis not present

## 2016-03-12 ENCOUNTER — Encounter: Payer: Self-pay | Admitting: Neurology

## 2016-03-12 ENCOUNTER — Ambulatory Visit (INDEPENDENT_AMBULATORY_CARE_PROVIDER_SITE_OTHER): Payer: Medicare Other | Admitting: Neurology

## 2016-03-12 VITALS — BP 142/86 | HR 80 | Wt 197.0 lb

## 2016-03-12 DIAGNOSIS — R269 Unspecified abnormalities of gait and mobility: Secondary | ICD-10-CM | POA: Diagnosis not present

## 2016-03-12 DIAGNOSIS — I2510 Atherosclerotic heart disease of native coronary artery without angina pectoris: Secondary | ICD-10-CM

## 2016-03-12 DIAGNOSIS — Z9181 History of falling: Secondary | ICD-10-CM

## 2016-03-12 DIAGNOSIS — R251 Tremor, unspecified: Secondary | ICD-10-CM

## 2016-03-12 DIAGNOSIS — Z736 Limitation of activities due to disability: Secondary | ICD-10-CM

## 2016-03-12 DIAGNOSIS — G20 Parkinson's disease: Secondary | ICD-10-CM | POA: Diagnosis not present

## 2016-03-12 DIAGNOSIS — E538 Deficiency of other specified B group vitamins: Secondary | ICD-10-CM | POA: Diagnosis not present

## 2016-03-12 MED ORDER — CARBIDOPA-LEVODOPA 25-100 MG PO TABS: 0.5000 | ORAL_TABLET | Freq: Three times a day (TID) | ORAL | 3 refills | Status: DC

## 2016-03-12 NOTE — Patient Instructions (Addendum)
Remember to drink plenty of fluid, eat healthy meals and do not skip any meals. Try to eat protein with a every meal and eat a healthy snack such as fruit or nuts in between meals. Try to keep a regular sleep-wake schedule and try to exercise daily, particularly in the form of walking, 20-30 minutes a day, if you can.   As far as your medications are concerned, I would like to suggest:  1. B12 daily 1000-2026mcg a day 2. Sinemet 1/2 pill every 4 hours. Try to separate Sinemet from food (especially protein-rich foods like meat, dairy, eggs) by about 30-60 mins - this will help the absorption of the medication. If you have some nausea with the medication, you can take it with some light food like crackers or ginger ale 3. Recommend memory testing and consideration for Exelon if needed 4. Recommend Swallow evaluation to assess for aspiration 5. In 2-3 weeks call with updates and can increase Sinemet 6. Will order in-home therapy with a nurse, physical therapy and occupational therapy 7. Will check B12 and TSH today 8. Recommend f/u in 4 months  As far as diagnostic testing: B12 lab, Tsh today.   I would like to see you back in 4 months, sooner if we need to. Please call us with any interim questions, concerns, problems, updates or refill requests.    Our phone number is [phone]. We also have an after hours call service for urgent matters and there is a physician on-call for urgent questions. For any emergencies you know to call 911 or go to the nearest emergency room  Carbidopa; Levodopa tablets What is this medicine? CARBIDOPA;LEVODOPA (kar bi DOE pa; lee voe DOE pa) is used to treat the symptoms of Parkinson's disease. This medicine may be used for other purposes; ask your health care provider or pharmacist if you have questions. What should I tell my health care provider before I take this medicine? They need to know if you have any of these conditions: -asthma or lung  disease -depression or other mental illness -diabetes -glaucoma -heart disease, including history of a heart attack -irregular heart beat -kidney or liver disease -melanoma or suspicious skin lesions -stomach or intestine ulcers -an unusual or allergic reaction to levodopa, carbidopa, other medicines, foods, dyes, or preservatives -pregnant or trying to get pregnant -breast-feeding How should I use this medicine? Take this medicine by mouth with a glass of water. Follow the directions on the prescription label. Take your doses at regular intervals. Do not take your medicine more often than directed. Do not stop taking except on the advice of your doctor or health care professional. Talk to your pediatrician regarding the use of this medicine in children. Special care may be needed. Overdosage: If you think you have taken too much of this medicine contact a poison control center or emergency room at once. NOTE: This medicine is only for you. Do not share this medicine with others. What if I miss a dose? If you miss a dose, take it as soon as you can. If it is almost time for your next dose, take only that dose. Do not take double or extra doses. What may interact with this medicine? Do not take this medicine with any of the following medications: -MAOIs like Marplan, Nardil, and Parnate -reserpine -tetrabenazine This medicine may also interact with the following medications: -alcohol -droperidol -entacapone -iron supplements or multivitamins with iron -isoniazid, INH -linezolid -medicines for depression, anxiety, or psychotic disturbances -medicines for high  blood pressure -medicines for sleep -metoclopramide -papaverine -procarbazine -tedizolid -rasagiline -selegiline -tolcapone This list may not describe all possible interactions. Give your health care provider a list of all the medicines, herbs, non-prescription drugs, or dietary supplements you use. Also tell them if you  smoke, drink alcohol, or use illegal drugs. Some items may interact with your medicine. What should I watch for while using this medicine? Visit your doctor or health care professional for regular checks on your progress. It may be several weeks or months before you feel the full benefits of this medicine. Continue to take your medicine on a regular schedule. Do not take any additional medicines for Parkinson's disease without first consulting with your health care provider. You may experience a wearing of effect prior to the time for your next dose of this medicine. You may also experience an on-off effect where the medicine apparently stops working for anything from a minute to several hours, then suddenly starts working again. Tell your doctor or health care professional if any of these symptoms happen to you. Your dose may need to be changed. A high protein diet can slow or prevent absorption of this medicine. Avoid high protein foods near the time of taking this medicine to help to prevent these problems. Take this medicine at least 30 minutes before eating or one hour after meals. You may want to eat higher protein foods later in the day or in small amounts. Discuss your diet with your doctor or health care professional or nutritionist. You may get drowsy or dizzy. Do not drive, use machinery, or do anything that needs mental alertness until you know how this drug affects you. Do not stand or sit up quickly, especially if you are an older patient. This reduces the risk of dizzy or fainting spells. Alcohol can make you more drowsy and dizzy. Avoid alcoholic drinks. If you find that you have sudden feelings of wanting to sleep during normal activities, like cooking, watching television, or while driving or riding in a car, you should contact your health care professional. If you are diabetic, this medicine may interfere with the accuracy of some tests for sugar or ketones in the urine (does not interfere  with blood tests). Check with your doctor or health care professional before changing the dose of your diabetic medicine. This medicine may discolor the urine or sweat, making it look darker or red in color. This is of no cause for concern. However, this may stain clothing or fabrics. There have been reports of increased sexual urges or other strong urges such as gambling while taking some medicines for Parkinson's disease. If you experience any of these urges while taking this medicine, you should report it to your health care provider as soon as possible. You should check your skin often for changes to moles and new growths while taking this medicine. Call your doctor if you notice any of these changes. What side effects may I notice from receiving this medicine? Side effects that you should report to your doctor or health care professional as soon as possible: -allergic reactions like skin rash, itching or hives, swelling of the face, lips, or tongue -anxiety, confusion, or nervousness -falling asleep during normal activities like driving -fast, irregular heartbeat -hallucination, loss of contact with reality -mood changes like aggressive behavior, depression -stomach pain -trouble passing urine -uncontrolled movements of the mouth, head, hands, feet, shoulders, eyelids or other unusual muscle movements Side effects that usually do not require medical attention (report to  your doctor or health care professional if they continue or are bothersome): -headache -loss of appetite -muscle twitches -nausea, vomiting -nightmares, trouble sleeping -unusually weak or tired This list may not describe all possible side effects. Call your doctor for medical advice about side effects. You may report side effects to FDA at 1-800-FDA-1088. Where should I keep my medicine? Keep out of the reach of children. Store at room temperature between 15 and 30 degrees C (59 and 86 degrees F). Protect from light.  Throw away any unused medicine after the expiration date. NOTE: This sheet is a summary. It may not cover all possible information. If you have questions about this medicine, talk to your doctor, pharmacist, or health care provider.    2016, [employer]vier/[employer] Standard. (2013-08-15 15:41:53)

## 2016-03-12 NOTE — Progress Notes (Signed)
[facility]    Provider:  Dr Jaynee Eagles Referring Provider: [name], MD Primary Care Physician:  Tawanna Solo, MD  CC:  Parkinsonism   Interval history 03/13/2016: He has chronic low back pain, stiffness in the low back, and shoots down the right leg. Constant aching in the right leg. Had about 20 shots in the low back. His legs are getting weaker, shuffling. He goes to Dr. Brien Few at [facility] and spine. They deny any memory changes, they declined an MMSE, advised if he is having memory loss we could start medication that could slow down memory loss. Tremor is stable, mostly on the right, none in the left. Volume of voice is getting lower. He has some choking with food. I recommended a swallow eval for aspiration but they declined. Denies REM sleep disorder. No snoring, no witnessed apneic events. No drooling or wet pillows or increased saliva. He is shuffling, he fell in July after walking in the yard and he couldn't stop. He is supposed to use a walker but they don;t use it, will request [facility] for PT. No loss of taste or smell. No incontinence. Handwriting is sloppy, looks like a 77-year old. When he stands up he has to stand for a few minutes. No freezing.   He has significant white-matter changes in the brain; extensive changes of chronic microvascular disease and scattered multiple microhemorrhages of remote age. Reviewed images with patient and wife again today.   HPI:  Darryl [name] is a 81 y.o. male here as a referral from Dr. Sabra Heck for Parkinson's disease  He has been having urinary retention. He has a foley. He developed urinary tract infection and sepsis. Been trying to take the foley out, but he cannot urinate. He has an appointment for urodynamics and being treated for enlarged prostate. Urologist wanted him evaluated for urinary retention in relation to PD. He has been having urinary difficulty since 2005. This has happened in the past in 2005 after  surgery, then in 2010 after syrgery where he had to wear a foley until he could urinate again. Then in November of 2014 after back surgery. This time his urinary retention happened after epidural steroid injections for LBP.  Patient has been followed in clinic for PD but has declined medication in the past as he feels his symptoms are not severe enough and manageable without medications.  Interval Update 06/12/2014: Tremor is stable. Sometimes you can see it and other times it is not there. No problems eating, dressing. Right is worse so needs to use left hand. Right >> left. Handwriting is getting worse. Walking is getting a little slower. Has been working with the cardiologist for tight blood pressure control. No muscle stiffness. No falls. Not using a walking aid. No REM sleep disorder. Sense of smell is fine. Has constipation since starting the celexa for anxiety. No orthostatic dizzines.   12/12/2013 Dr. Janann Colonel: Darryl [name] is a 81 y.o. male here as a follow up from Dr. Rex Kras for tremor evaluation. Last visit was 08/2013 at which time there was concern over possible amyloid angiopathy. He was evaluated by Dr Leonie Man with findings likely related to chronic HTN/small vessel disease. His ASA 81mg  was changed to every other day due to bruising. Overall doing well. Continues to have difficulties with his blood pressure. Currently working with Dr [name] (cardiology).   The tremor is stable, no worsening, remains stable. He currently reports some pain radiating from lower back down the posterior surface  of his right leg. He has a history of lumbar disc herniation s/p surgery.    Initial visit 07/2013 Dr. Janann Colonel: First noticed tremor around 3 years ago, started on right but now involves both hands but is R>L. Described as both a rest and action/postural tremor. Bothers him the most when trying to eat. Handwriting has gotten messier, no change in size. Wife notes some generalized slowing down,  some shuffling of his feet. No muscle stiffness or cramps. He does not some gait instability, has some trouble getting in and out of the car. Feels very unsteady going down stairs. Has not noticed anything that makes the tremor worse. No intake of caffeine or EtOH. Sleeps well, wife notes some REM behavior disorder (talking in his sleep). No known difficulty with sense of smell. No known stroke or TIA, has had CAD with CABG x 4.   He thinks his father may have had a tremor.   Reviewed notes, labs and imaging from outside physicians, which showed: MRI of the brain 07/2013: Abnormal MRI scan of the brain showing extensive changes of chronic microvascular disease and scattered multiple microhemorrhages of remote age. Possibilities include amyloid angiopathy versus small vessel disease.   Review of Systems: Patient complains of symptoms per HPI as well as the following symptoms: Jevity change, difficulty urinating, headache, tremors, passing out, ringing in ears, joint pain, back pain, walking difficulty, restless legs, daytime sleepiness. Pertinent negatives per HPI. All others negative.    Social History   Social History  . Marital status: Married    Spouse name: [name]  . Number of children: 4  . Years of education: College   Occupational History  . Not on file.   Social History Main Topics  . Smoking status: Former Smoker    Packs/day: 2.00    Years: 5.00    Types: Cigarettes    Quit date: 04/28/1959  . Smokeless tobacco: Never Used     Comment: 55+ yrs ago  . Alcohol use No  . Drug use: No  . Sexual activity: Not on file   Other Topics Concern  . Not on file   Social History Narrative   Patient is married to [name]), has 4 children   Patient is right handed   Education level is college   Caffeine consumption is none    Family History  Problem Relation Age of Onset  . CAD Father 66    ASCAD  . CAD Sister     Past Medical History:  Diagnosis Date  . Arthritis    . BPH (benign prostatic hypertrophy) with urinary retention   . Chronic diastolic CHF (congestive heart failure) ([place])   . Colon polyp   . Coronary atherosclerosis of native coronary artery    a. s/p CABG 11/2003. b. s/p DES to radial-OM and SVG-RCA 11/05. c. s/p BMS to LCx 11/2008.  . Dilated aortic root ([place]) 05/29/2014  . Diverticulosis   . Dyslipidemia    statin intolerant  . Elevated fasting glucose   . Gastritis   . Gastritis    a. by EGD 04/2004.  . H/O urinary retention    post surgical procedures; has foley cath  . Headache(784.0)   . Hiatal hernia   . HOH (hard of hearing)    slight hoh, rt>l  . HTN (hypertension)   . Hypertensive heart disease   . Insomnia   . Intracranial hemorrhage ([place])    a. small pontine [place] 2003.  [name] LBBB (left bundle branch block)   .  Lumbar spondylolysis    multilevel DJD with mild spinal stenosis L3-L5  . Migraines   . Parkinson's disease ([place])   . Peptic ulcer disease    a. prior hx of bleeding ulcers, including 12/2003 in setting of aspirin use.  . Renal insufficiency    renal duplex scan in 2012 showed no renal artery stenosis  . Sleep disturbance   . Stroke [facility])    a. remote hx of stroke reportedly r/t HTN.  [name] Thrombocytopenia ([place])   . Tremor   . UTI (urinary tract infection)   . Wears glasses     Past Surgical History:  Procedure Laterality Date  . APPENDECTOMY  62   1963  . CHOLECYSTECTOMY  1998  . COLON SURGERY  03/14/2008  . COLONOSCOPY  10/2001  . CORONARY ANGIOPLASTY  6/10,10/05   Dr. [name] cardiologist: last office visit 11/07/14  . CORONARY ARTERY BYPASS GRAFT  2005  . CYSTOSCOPY WITH LITHOLAPAXY N/A 11/19/2014   Procedure: CYSTOSCOPY WITH LITHOLAPAXY;  Surgeon: [name], MD;  Location: [facility];  Service: Urology;  Laterality: N/A;  . ESOPHAGOGASTRODUODENOSCOPY     10/2001, 12/2003, 04/2004  . LUMBAR LAMINECTOMY/DECOMPRESSION MICRODISCECTOMY N/A 05/03/2013   Procedure: LUMBAR  LAMINECTOMY/DCOMPRESSION MICRODISCECTOMY RIGHT LUMBAR FOUR FIVE;  Surgeon: [name], MD;  Location: [facility];  Service: Neurosurgery;  Laterality: N/A;  . SHOULDER ARTHROSCOPY Left 9/10  . stones     hx  . TRANSURETHRAL RESECTION OF PROSTATE N/A 11/19/2014   Procedure: TRANSURETHRAL RESECTION OF THE PROSTATE WITH GYRUS INSTRUMENTS;  Surgeon: [name], MD;  Location: [facility];  Service: Urology;  Laterality: N/A;  . WISDOM TOOTH EXTRACTION      Current Outpatient Prescriptions  Medication Sig Dispense Refill  . acetaminophen (TYLENOL) 500 MG tablet Take 1,000 mg by mouth every 8 (eight) hours as needed for mild pain or moderate pain.    [name] aspirin EC 81 MG tablet Take 81 mg by mouth. Patient takes 3 times a week    . carvedilol (COREG) 6.25 MG tablet Take 1 tablet (6.25 mg total) by mouth 2 (two) times daily. 90 tablet 3  . citalopram (CELEXA) 20 MG tablet Take 20 mg by mouth daily.    . cloNIDine (CATAPRES) 0.2 MG tablet Take 0.2-0.3 mg by mouth 2 (two) times daily. Takes 1.5 tablet in am and 1.5 tablets in PM daily    . furosemide (LASIX) 20 MG tablet Take 1 tablet (20 mg total) by mouth daily. 90 tablet 3  . hydrALAZINE (APRESOLINE) 25 MG tablet Take 1 tablet (25 mg total) by mouth 4 (four) times daily. 360 tablet 3  . irbesartan (AVAPRO) 300 MG tablet Take 300 mg by mouth every morning.    [name] KLOR-CON 10 10 MEQ tablet Take 1 tablet (10 mEq total) by mouth daily. 90 tablet 3  . nitroGLYCERIN (NITROSTAT) 0.4 MG SL tablet Place 1 tablet (0.4 mg total) under the tongue every 5 (five) minutes as needed for chest pain. 25 tablet 12  . Omega-3 Fatty Acids (FISH OIL PO) Take 800 mg by mouth daily. Carlson's Liquid    . pantoprazole (PROTONIX) 40 MG tablet Take 40 mg by mouth as directed.     . traMADol (ULTRAM) 50 MG tablet Take 50 mg by mouth daily as needed (pain).     . carbidopa-levodopa (SINEMET IR) 25-100 MG tablet Take 0.5 tablets by mouth 3 (three) times daily.  Take them 4 hours apart. 90 tablet 3  No current facility-administered medications for this visit.     Allergies as of 03/12/2016 - Review Complete 03/12/2016  Allergen Reaction Noted  . Ambien [zolpidem tartrate] Other (See Comments) 11/19/2014  . Aspirin Other (See Comments) 04/27/2013  . Celebrex [celecoxib] Other (See Comments) 04/10/2013  . Nsaids Other (See Comments) 04/10/2013  . Toprol xl [metoprolol tartrate] Swelling 04/10/2013  . Altace [ramipril]  04/10/2013  . Amlodipine  10/25/2014  . Levothyroxine Other (See Comments) 08/11/2013  . Biaxin [clarithromycin] Nausea Only 04/10/2013  . Tetracyclines & related Nausea Only 04/10/2013    Vitals: BP (!) 142/86   Pulse 80   Wt 197 lb (89.4 kg)   BMI 29.52 kg/m  Last Weight:  Wt Readings from Last 1 Encounters:  03/12/16 197 lb (89.4 kg)   Last Height:   Ht Readings from Last 1 Encounters:  12/02/15 5' 8.5" (1.74 m)    Cranial Nerves:  The pupils are equal, round, and reactive to light. Visual fields are full to finger confrontation. Extraocular movements are intact. Trigeminal sensation is intact and the muscles of mastication are normal. The face is symmetric. The palate elevates in the midline. Voice is normal. Shoulder shrug is normal. The tongue has normal motion without fasciculations.   Coordination: Mild bradykinesia noted with finger tapping left greater than right, hand opening closing left greater than right and rapid alternating movement. Bradykinesia with tapping of left foot, right within normal limits.  Gait:  decreased arm swing on the left, shuffling of steps, no re emergent tremor in the right, Romberg negative, multiple steps back with pull test   Motor Observation:  Minimal rest tremor noted right hand greater than left, bilateral intention and postural tremor Tone:  cogwheeling bilateral wrists  Posture:  Posture is stooped   Strength:  Strength is V/V in the upper and  lower limbs.       Assessment/Plan:  81 year old gentleman presenting for follow up evaluation of parkinsonism, which is been ongoing for multiple years and is getting progressively worse. Symptoms c/w parkinsonism, likely idiopathic but vascular PD also possible given his extensive changes of chronic microvascular disease and scattered multiple microhemorrhages of remote age on MRI of the brain. Reviewed images with patient and wife again today. Marland KitchenHe describes both a rest and action component to tremor, he feels action component is causing the most difficulty. MRI brain reviewed showed multiple micro-hemorrhages. Was evaluated by Dr Leonie Man who suggested aggressive BP control of <130/90. Given his extensive changes of chronic microvascular disease and managing blood pressure and other vascular risk factors is important, advised them again today   Will start Sinemet 3x a day. They finally agree.  PT in home - Will order [facility] for PT, OT and nursing given his parkinsonism, gait abnormalities, difficulty walking and risk for falls, difficulty with daily activities of living and needs nursing for medication management Suggest swallow study for aspiration, they decline Check B12 and tsh today Start taking daily oral B12 for low B12 196 per report of wife. Discussed risks of B12 deficiency Given his significant white-matter changes in the brain; extensive changes of chronic microvascular disease and scattered multiple microhemorrhages of remote age I wouldn't be surprised if patient had significant memory loss but they deny and declined any memory testing in the office. Fall risk: will order PT as above  1. B12 daily 1000-2072mcg a day 2. Sinemet 1/2 pill every 4 hours. Try to separate Sinemet from food (especially protein-rich foods like meat, dairy, eggs) by about  30-60 mins - this will help the absorption of the medication. If you have some nausea with the medication, you can take it with some  light food like crackers or ginger ale 3. Recommend memory testing and consideration for Exelon if needed - declined 4. Recommend Swallow evaluation to assess for aspiration - denied 5. In 2-3 weeks call with updates and can increase Sinemet 6. Will order in-home therapy with a nurse, physical therapy and occupational therapy 7. Will check B12 and TSH today 8. Recommend f/u in 4 months   Sarina Ill, MD  [facility] [address] [place], [place] [address]  Phone [phone] Fax [phone]  A total of 40 minutes was spent face-to-face with this patient. Over half this time was spent on counseling patient on the Parkinsonism, gait abnormality, fall risk diagnosis and different diagnostic and therapeutic options available.

## 2016-03-13 ENCOUNTER — Encounter: Payer: Self-pay | Admitting: Neurology

## 2016-03-15 LAB — THYROID PANEL WITH TSH
FREE THYROXINE INDEX: 1.8 (ref 1.2–4.9)
T3 UPTAKE RATIO: 26 % (ref 24–39)
T4 TOTAL: 6.9 ug/dL (ref 4.5–12.0)
TSH: 4.58 u[IU]/mL — AB (ref 0.450–4.500)

## 2016-03-15 LAB — B12 AND FOLATE PANEL
Folate: 14 ng/mL (ref >3.0)
VITAMIN B 12: 263 pg/mL (ref 211–946)

## 2016-03-15 LAB — METHYLMALONIC ACID, SERUM: Methylmalonic Acid: 494 nmol/L — ABNORMAL HIGH (ref 0–378)

## 2016-03-16 ENCOUNTER — Encounter: Payer: Self-pay | Admitting: *Deleted

## 2016-03-16 NOTE — Progress Notes (Signed)
Faxed recent lab results to PCP [name], MD for their records. Fax: [phone]. Received confirmation.

## 2016-03-17 ENCOUNTER — Telehealth: Payer: Self-pay | Admitting: Neurology

## 2016-03-17 DIAGNOSIS — M5441 Lumbago with sciatica, right side: Secondary | ICD-10-CM | POA: Diagnosis not present

## 2016-03-17 DIAGNOSIS — I2510 Atherosclerotic heart disease of native coronary artery without angina pectoris: Secondary | ICD-10-CM | POA: Diagnosis not present

## 2016-03-17 DIAGNOSIS — Z87891 Personal history of nicotine dependence: Secondary | ICD-10-CM | POA: Diagnosis not present

## 2016-03-17 DIAGNOSIS — I5032 Chronic diastolic (congestive) heart failure: Secondary | ICD-10-CM | POA: Diagnosis not present

## 2016-03-17 DIAGNOSIS — E785 Hyperlipidemia, unspecified: Secondary | ICD-10-CM | POA: Diagnosis not present

## 2016-03-17 DIAGNOSIS — Z951 Presence of aortocoronary bypass graft: Secondary | ICD-10-CM | POA: Diagnosis not present

## 2016-03-17 DIAGNOSIS — I959 Hypotension, unspecified: Secondary | ICD-10-CM | POA: Diagnosis not present

## 2016-03-17 DIAGNOSIS — I130 Hypertensive heart and chronic kidney disease with heart failure and stage 1 through stage 4 chronic kidney disease, or unspecified chronic kidney disease: Secondary | ICD-10-CM | POA: Diagnosis not present

## 2016-03-17 DIAGNOSIS — Z7982 Long term (current) use of aspirin: Secondary | ICD-10-CM | POA: Diagnosis not present

## 2016-03-17 DIAGNOSIS — R339 Retention of urine, unspecified: Secondary | ICD-10-CM | POA: Diagnosis not present

## 2016-03-17 DIAGNOSIS — G20 Parkinson's disease: Secondary | ICD-10-CM | POA: Diagnosis not present

## 2016-03-17 DIAGNOSIS — I252 Old myocardial infarction: Secondary | ICD-10-CM | POA: Diagnosis not present

## 2016-03-17 DIAGNOSIS — N189 Chronic kidney disease, unspecified: Secondary | ICD-10-CM | POA: Diagnosis not present

## 2016-03-17 DIAGNOSIS — D519 Vitamin B12 deficiency anemia, unspecified: Secondary | ICD-10-CM | POA: Diagnosis not present

## 2016-03-17 DIAGNOSIS — N401 Enlarged prostate with lower urinary tract symptoms: Secondary | ICD-10-CM | POA: Diagnosis not present

## 2016-03-17 DIAGNOSIS — M5136 Other intervertebral disc degeneration, lumbar region: Secondary | ICD-10-CM | POA: Diagnosis not present

## 2016-03-17 NOTE — Telephone Encounter (Signed)
Called and LVM returning call from [place]. Asked her to call back and discuss. Advised if they can fax POC to [phone], then Dr Jaynee Eagles can sign and we can always fax back.

## 2016-03-17 NOTE — Telephone Encounter (Signed)
Jane with [facility] is calling to get verbal orders for the patient's plan of care.

## 2016-03-17 NOTE — Telephone Encounter (Signed)
Jane/ [name] returned call. She will fax paper work tomorrow morning.

## 2016-03-18 ENCOUNTER — Telehealth: Payer: Self-pay | Admitting: Neurology

## 2016-03-18 DIAGNOSIS — I959 Hypotension, unspecified: Secondary | ICD-10-CM | POA: Diagnosis not present

## 2016-03-18 DIAGNOSIS — I130 Hypertensive heart and chronic kidney disease with heart failure and stage 1 through stage 4 chronic kidney disease, or unspecified chronic kidney disease: Secondary | ICD-10-CM | POA: Diagnosis not present

## 2016-03-18 DIAGNOSIS — G20 Parkinson's disease: Secondary | ICD-10-CM | POA: Diagnosis not present

## 2016-03-18 DIAGNOSIS — N189 Chronic kidney disease, unspecified: Secondary | ICD-10-CM | POA: Diagnosis not present

## 2016-03-18 DIAGNOSIS — I5032 Chronic diastolic (congestive) heart failure: Secondary | ICD-10-CM | POA: Diagnosis not present

## 2016-03-18 DIAGNOSIS — D519 Vitamin B12 deficiency anemia, unspecified: Secondary | ICD-10-CM | POA: Diagnosis not present

## 2016-03-18 NOTE — Telephone Encounter (Signed)
Called and spoke to patient wife. Relayed Dr. Jaynee Eagles message but advised her to have patient stop medication d/t call we received from [facility] and his BP is fluctuating a lot and causing dizziness. Advised to call if his sx do not improve. She verbalized understanding and stated she gave medication to him today and will stop any further doses. He did take last dose with food. Advised we will also give verbal orders to [facility] for PT 2x/week for 4 weeks.

## 2016-03-18 NOTE — Telephone Encounter (Signed)
Wife called regarding carbidopa-levodopa (SINEMET IR) 25-100 MG tablet, states patient is having stomach cramps/stomach ache. Please call wife [phone].

## 2016-03-18 NOTE — Telephone Encounter (Signed)
Called and spoke to patient wife. Relayed Dr. Jaynee Eagles message but advised her to have patient stop medication d/t call we received from [facility] and his BP is fluctuating a lot and causing dizziness. Advised to call if his sx do not improve. She verbalized understanding and stated she gave medication to him today and will stop any further doses. He did take last dose with food. Advised we will also give verbal orders to [facility] for PT 2x/week for 4 weeks.

## 2016-03-18 NOTE — Telephone Encounter (Signed)
Called and spoke to [place] at [place]. Advised I spoke to wife and advised her to stop the sinemet patient is taking per  DR Jaynee Eagles. Gave VO from Dr Jaynee Eagles for PT 2x/week for 4 weeks. She verbalized understanding.

## 2016-03-18 NOTE — Telephone Encounter (Signed)
Abdominal pain can happen with the medication. If he can tolerate it it may get better. If not he should stop it.

## 2016-03-18 NOTE — Telephone Encounter (Signed)
Dr Jaynee Eagles- please advise. Pt PCP [name], MD. Not sure if you are managing these other things?

## 2016-03-18 NOTE — Telephone Encounter (Signed)
Beckie Busing Singletary/[name] [phone] called to report out of range BP's, states patient took BP medication 8am, when she arrive around 10:30-10:40am BP was 110/80, states patient has Parkinson's, took BP standing 80/60 w/symptoms of dizziness, disorientation and sudden headache. Also requests verbal order for PT twice a week x 4 weeks for [facility] for Parkinson's and gait balance training. Requests that we call wife regarding BP medication mgmt.

## 2016-03-18 NOTE — Telephone Encounter (Signed)
Tell him to stop the Sinemet.Sinemet can cause decreased blood pressure. Please give orders for PT thanks

## 2016-03-18 NOTE — Telephone Encounter (Signed)
Dr Jaynee Eagles- please advise  Called and spoke to wife. She stated patient started medication on 9/14 and took one dose. After, he has taken 3x/day at least four hours apart, seperated from food by 30-39min as prescribed. Stomach pain started when he started medication. Started under right breast then went to under left breast and has gone down to stomach. He states its intermittent. Usually after he takes medication. He has tried eating crackers, but does not help. Advised I will send message to Dr Jaynee Eagles and call back to advise once I receive response. She verbalized understanding.   I also scheduled 4 month f/u on 07/14/15 at 9am, check in 845 per Dr Jaynee Eagles request.

## 2016-03-19 NOTE — Telephone Encounter (Signed)
Called Darryl [name] back. I advised I have not received any fax yet. She apologized for the mis-understanding. She stated she was going to send POC once ready to [place] for Dr Jaynee Eagles to review/sign. Nothing further needed at this time.

## 2016-03-23 DIAGNOSIS — G20 Parkinson's disease: Secondary | ICD-10-CM | POA: Diagnosis not present

## 2016-03-23 DIAGNOSIS — N189 Chronic kidney disease, unspecified: Secondary | ICD-10-CM | POA: Diagnosis not present

## 2016-03-23 DIAGNOSIS — I5032 Chronic diastolic (congestive) heart failure: Secondary | ICD-10-CM | POA: Diagnosis not present

## 2016-03-23 DIAGNOSIS — D519 Vitamin B12 deficiency anemia, unspecified: Secondary | ICD-10-CM | POA: Diagnosis not present

## 2016-03-23 DIAGNOSIS — I130 Hypertensive heart and chronic kidney disease with heart failure and stage 1 through stage 4 chronic kidney disease, or unspecified chronic kidney disease: Secondary | ICD-10-CM | POA: Diagnosis not present

## 2016-03-23 DIAGNOSIS — I959 Hypotension, unspecified: Secondary | ICD-10-CM | POA: Diagnosis not present

## 2016-03-24 ENCOUNTER — Encounter: Payer: Self-pay | Admitting: *Deleted

## 2016-03-24 ENCOUNTER — Telehealth: Payer: Self-pay | Admitting: Neurology

## 2016-03-24 ENCOUNTER — Encounter: Payer: Self-pay | Admitting: Cardiology

## 2016-03-24 DIAGNOSIS — N189 Chronic kidney disease, unspecified: Secondary | ICD-10-CM | POA: Diagnosis not present

## 2016-03-24 DIAGNOSIS — I5032 Chronic diastolic (congestive) heart failure: Secondary | ICD-10-CM | POA: Diagnosis not present

## 2016-03-24 DIAGNOSIS — I130 Hypertensive heart and chronic kidney disease with heart failure and stage 1 through stage 4 chronic kidney disease, or unspecified chronic kidney disease: Secondary | ICD-10-CM | POA: Diagnosis not present

## 2016-03-24 DIAGNOSIS — G20 Parkinson's disease: Secondary | ICD-10-CM | POA: Diagnosis not present

## 2016-03-24 DIAGNOSIS — I959 Hypotension, unspecified: Secondary | ICD-10-CM | POA: Diagnosis not present

## 2016-03-24 DIAGNOSIS — D519 Vitamin B12 deficiency anemia, unspecified: Secondary | ICD-10-CM | POA: Diagnosis not present

## 2016-03-24 NOTE — Progress Notes (Signed)
Faxed signed POC by AA, MD back to [place] for certification dates [id number]. Fax: [phone]. Received confirmation.

## 2016-03-24 NOTE — Telephone Encounter (Signed)
That's fine. thanks

## 2016-03-24 NOTE — Telephone Encounter (Signed)
Called and left VM for Monique giving VO per Dr Jaynee Eagles to do OT eval and treat as requested. Gave [facility] phone number if she has further questions.

## 2016-03-24 NOTE — Telephone Encounter (Signed)
Dr Jaynee Eagles- are you ok with me calling and giving VO?

## 2016-03-24 NOTE — Telephone Encounter (Signed)
Monique/[name] called for verbal orders for OT eval and treatment please call [phone]

## 2016-03-25 ENCOUNTER — Encounter: Payer: Self-pay | Admitting: *Deleted

## 2016-03-25 NOTE — Progress Notes (Signed)
Faxed signed face-face encounter acknowledgement on 03/12/16 by AA, MD to [phone] [facility]). Received fax confirmation.

## 2016-03-26 ENCOUNTER — Ambulatory Visit (INDEPENDENT_AMBULATORY_CARE_PROVIDER_SITE_OTHER): Payer: Medicare Other | Admitting: Cardiology

## 2016-03-26 ENCOUNTER — Encounter: Payer: Self-pay | Admitting: Cardiology

## 2016-03-26 VITALS — BP 150/62 | HR 66 | Ht 68.5 in | Wt 194.1 lb

## 2016-03-26 DIAGNOSIS — I951 Orthostatic hypotension: Secondary | ICD-10-CM | POA: Insufficient documentation

## 2016-03-26 DIAGNOSIS — I110 Hypertensive heart disease with heart failure: Secondary | ICD-10-CM | POA: Diagnosis not present

## 2016-03-26 DIAGNOSIS — I130 Hypertensive heart and chronic kidney disease with heart failure and stage 1 through stage 4 chronic kidney disease, or unspecified chronic kidney disease: Secondary | ICD-10-CM | POA: Diagnosis not present

## 2016-03-26 DIAGNOSIS — I2510 Atherosclerotic heart disease of native coronary artery without angina pectoris: Secondary | ICD-10-CM | POA: Diagnosis not present

## 2016-03-26 DIAGNOSIS — N189 Chronic kidney disease, unspecified: Secondary | ICD-10-CM | POA: Diagnosis not present

## 2016-03-26 DIAGNOSIS — D519 Vitamin B12 deficiency anemia, unspecified: Secondary | ICD-10-CM | POA: Diagnosis not present

## 2016-03-26 DIAGNOSIS — G20 Parkinson's disease: Secondary | ICD-10-CM | POA: Diagnosis not present

## 2016-03-26 DIAGNOSIS — I959 Hypotension, unspecified: Secondary | ICD-10-CM | POA: Diagnosis not present

## 2016-03-26 DIAGNOSIS — I5032 Chronic diastolic (congestive) heart failure: Secondary | ICD-10-CM | POA: Diagnosis not present

## 2016-03-26 MED ORDER — HYDRALAZINE HCL 25 MG PO TABS: 25.0000 mg | ORAL_TABLET | Freq: Three times a day (TID) | ORAL | 3 refills | Status: DC

## 2016-03-26 MED ORDER — CARVEDILOL 6.25 MG PO TABS: 6.2500 mg | ORAL_TABLET | Freq: Two times a day (BID) | ORAL | 3 refills | Status: DC

## 2016-03-26 NOTE — Progress Notes (Signed)
03/26/2016 JERAMINE LOUKS   [birth year]  [id number]  Primary Physician Tawanna Solo, MD Primary Cardiologist: Dr. Radford Pax   Reason for Visit/CC: Fluctuating BP   HPI:  Darryl [name] is a 81 y.o. male with a history of CAD, HTN, chronic LBBB, dyslipidemia, dilated aortic root followed yearly and chronic diastolic CHF. He also has a  history of Parkinson's disease. In June 2005, he underwent CABG surgery. In November 2005, he underwent stenting of his radial graft to his OM and saphenous main graft to his RCA. Last intervention was in June 2010. At that time he underwent bare metal stenting to his left circumflex. He is followed by Dr. Radford Pax.  Most recently, he has been seen in the hypertension clinic by Ronnald Collum, pharmacist, for medication adjustments.  He presents back to clinic today with his wife with complaints of fluctuating blood pressures. His neurologist recently started him on Sinemet for Parkinson's disease. He also now has a home health RN and physical therapist that comes out to his home several days a week. Since starting the Sinemet, he has noticed orthostatic hypotension, with systolic blood pressures occasionally dropping in the 80s to 90s. He denies any lightheadedness, dizziness, syncope/near-syncope. Because of this, along with symptoms of stomach cramps, he temporarily discontinued the Sinemet. He hopes to restart this soon, to see if he can tolerate. However his wife has concerns regarding his low blood pressure with the new medication.   Today in clinic, his BP is 150/62. Pulse rate is 66 bpm. EKG shows chronic LBBB. He denies CP. No dyspnea. No orthostatic symptoms.    Current Meds  Medication Sig  . acetaminophen (TYLENOL) 500 MG tablet Take 1,000 mg by mouth every 8 (eight) hours as needed for mild pain or moderate pain.  [name] aspirin EC 81 MG tablet Take 81 mg by mouth. Patient takes 3 times a week  . carvedilol (COREG) 6.25 MG tablet Take 1 tablet (6.25 mg  total) by mouth 2 (two) times daily.  . citalopram (CELEXA) 20 MG tablet Take 20 mg by mouth daily.  . cloNIDine (CATAPRES) 0.2 MG tablet Take 0.2-0.3 mg by mouth 2 (two) times daily. Takes 1.5 tablet in am and 1.5 tablets in PM daily  . furosemide (LASIX) 20 MG tablet Take 1 tablet (20 mg total) by mouth daily.  . hydrALAZINE (APRESOLINE) 25 MG tablet Take 1 tablet (25 mg total) by mouth 4 (four) times daily.  . irbesartan (AVAPRO) 300 MG tablet Take 300 mg by mouth every morning.  [name] KLOR-CON 10 10 MEQ tablet Take 1 tablet (10 mEq total) by mouth daily.  . nitroGLYCERIN (NITROSTAT) 0.4 MG SL tablet Place 1 tablet (0.4 mg total) under the tongue every 5 (five) minutes as needed for chest pain.  . Omega-3 Fatty Acids (FISH OIL PO) Take 800 mg by mouth daily. Carlson's Liquid  . pantoprazole (PROTONIX) 40 MG tablet Take 40 mg by mouth as directed.   . traMADol (ULTRAM) 50 MG tablet Take 50 mg by mouth daily as needed (pain).    Allergies  Allergen Reactions  . Ambien [Zolpidem Tartrate] Other (See Comments)    hallucinations  . Aspirin Other (See Comments)    ulcers  . Celebrex [Celecoxib] Other (See Comments)    Due to ulcer.  . Nsaids Other (See Comments)    Due to ulcer.  . Toprol Xl [Metoprolol Tartrate] Swelling  . Altace [Ramipril]     UNKNOWN  . Amlodipine     Feet  swell  . Levothyroxine Other (See Comments)    "made him feel bad"  . Biaxin [Clarithromycin] Nausea Only  . Tetracyclines & Related Nausea Only   Past Medical History:  Diagnosis Date  . Arthritis   . BPH (benign prostatic hypertrophy) with urinary retention   . Chronic diastolic CHF (congestive heart failure) ([place])   . Colon polyp   . Coronary atherosclerosis of native coronary artery    a. s/p CABG 11/2003. b. s/p DES to radial-OM and SVG-RCA 11/05. c. s/p BMS to LCx 11/2008.  . Dilated aortic root ([place]) 05/29/2014  . Diverticulosis   . Dyslipidemia    statin intolerant  . Elevated fasting glucose   .  Gastritis   . Gastritis    a. by EGD 04/2004.  . H/O urinary retention    post surgical procedures; has foley cath  . Headache(784.0)   . Hiatal hernia   . HOH (hard of hearing)    slight hoh, rt>l  . HTN (hypertension)   . Hypertensive heart disease   . Insomnia   . Intracranial hemorrhage ([place])    a. small pontine [place] 2003.  [name] LBBB (left bundle branch block)   . Lumbar spondylolysis    multilevel DJD with mild spinal stenosis L3-L5  . Migraines   . Parkinson's disease ([place])   . Peptic ulcer disease    a. prior hx of bleeding ulcers, including 12/2003 in setting of aspirin use.  . Renal insufficiency    renal duplex scan in 2012 showed no renal artery stenosis  . Sleep disturbance   . Stroke [facility])    a. remote hx of stroke reportedly r/t HTN.  [name] Thrombocytopenia ([place])   . Tremor   . UTI (urinary tract infection)   . Wears glasses    Family History  Problem Relation Age of Onset  . CAD Father 58    ASCAD  . CAD Sister    Past Surgical History:  Procedure Laterality Date  . APPENDECTOMY  62   1963  . CHOLECYSTECTOMY  1998  . COLON SURGERY  03/14/2008  . COLONOSCOPY  10/2001  . CORONARY ANGIOPLASTY  6/10,10/05   Dr. [name] cardiologist: last office visit 11/07/14  . CORONARY ARTERY BYPASS GRAFT  2005  . CYSTOSCOPY WITH LITHOLAPAXY N/A 11/19/2014   Procedure: CYSTOSCOPY WITH LITHOLAPAXY;  Surgeon: [name], MD;  Location: [facility];  Service: Urology;  Laterality: N/A;  . ESOPHAGOGASTRODUODENOSCOPY     10/2001, 12/2003, 04/2004  . LUMBAR LAMINECTOMY/DECOMPRESSION MICRODISCECTOMY N/A 05/03/2013   Procedure: LUMBAR LAMINECTOMY/DCOMPRESSION MICRODISCECTOMY RIGHT LUMBAR FOUR FIVE;  Surgeon: [name], MD;  Location: [facility];  Service: Neurosurgery;  Laterality: N/A;  . SHOULDER ARTHROSCOPY Left 9/10  . stones     hx  . TRANSURETHRAL RESECTION OF PROSTATE N/A 11/19/2014   Procedure: TRANSURETHRAL RESECTION OF THE PROSTATE WITH GYRUS  INSTRUMENTS;  Surgeon: [name], MD;  [facility];  Service: Urology;  Laterality: N/A;  . WISDOM TOOTH EXTRACTION     Social History   Social History  . Marital status: Married    Spouse name: [name]  . Number of children: 4  . Years of education: College   Occupational History  . Not on file.   Social History Main Topics  . Smoking status: Former Smoker    Packs/day: 2.00    Years: 5.00    Types: Cigarettes    Quit date: 04/28/1959  . Smokeless tobacco: Never Used  Comment: 55+ yrs ago  . Alcohol use No  . Drug use: No  . Sexual activity: Not on file   Other Topics Concern  . Not on file   Social History Narrative   Patient is married to [name]), has 4 children   Patient is right handed   Education level is college   Caffeine consumption is none     Review of Systems: General: negative for chills, fever, night sweats or weight changes.  Cardiovascular: negative for chest pain, dyspnea on exertion, edema, orthopnea, palpitations, paroxysmal nocturnal dyspnea or shortness of breath Dermatological: negative for rash Respiratory: negative for cough or wheezing Urologic: negative for hematuria Abdominal: negative for nausea, vomiting, diarrhea, bright red blood per rectum, melena, or hematemesis Neurologic: negative for visual changes, syncope, or dizziness All other systems reviewed and are otherwise negative except as noted above.   Physical Exam:  Blood pressure (!) 150/62, pulse 66, height 5' 8.5" (1.74 m), weight 194 lb 1.9 oz (88.1 kg).  General appearance: alert, cooperative and no distress Neck: no carotid bruit and no JVD Lungs: clear to auscultation bilaterally Heart: regular rate and rhythm, S1, S2 normal, no murmur, click, rub or gallop Extremities: no LEE Pulses: 2+ and symmetric Skin: Skin color, texture, turgor normal. No rashes or lesions Neurologic: Grossly normal, slight tremor  EKG NSR with LBB.   ASSESSMENT  AND PLAN:   1. ASCAD: s/p CABG in 2005 with subsequent stenting of his radial-OM, SVG-RCA and stenting of native LCx. Stable w/o angina.  Continue ASA/BB. Statin intolerant.   2.  Dilated aortic root -  Aortic root measured at 39 mm on cardiac MRI 05/2005. This is followed yearly. Repeat study ordered for Dec.   3.  Dyslipidemia - LDL goal is < 70.  LDL was 42 a year ago.  He is statin intolerant.    4.  Chronic diastolic CHF - he is euvolemic on exam today.  Continue BB/ARB/Lasix.  5.  HTN - BP controlled on current meds. He has had issues with recent orthostatic hypotension after starting Sinemet for PD. He stopped Sinemet for several days due to stomach cramps, but plans to restart. We will scale back on his hydralazine to allow more BP room for PD meds. He has currently been taking hydralazine 4x/day. Reduce frequency to TID. If still further issues, can reduce down to BID. He is on several other antihypertensives that we can adjust if needed as well. For now, continue BB/catapress//ARB. [place] RN to continue to monitor BP. Can also use compression stocking to help prevent orthostatic hypotension.   PLAN  Keep f/u with Dr. Radford Pax in Dec. F/u sooner if needed.   Lyda Jester [facility] 03/26/2016 8:47 AM

## 2016-03-26 NOTE — Patient Instructions (Addendum)
Medication Instructions:    1. START TAKING HYDRALAZINE 25 MG  3 TIMES  A DAY    2.  IF BLOOD PRESSURE IS RUNNING LOW  (SYSTOLIC TOP NUMBER )  IN THE 90'S  YOU MAKE TAKE            HYDRALAZINE 25 MG  TWICE A DAY    3. START  BACK TAKING SINEMET MEDICATION  If you need a refill on your cardiac medications before your next appointment, please call your pharmacy.  Labwork: NONE ORDER TODAY    Testing/Procedures: NONE ORDER TODAY     Follow-Up: KEEP APPT WITH DR TURNER AS SCHEDULED ON 06/01/2016   Any Other Special Instructions Will Be Listed Below (If Applicable).

## 2016-03-27 ENCOUNTER — Telehealth: Payer: Self-pay | Admitting: Neurology

## 2016-03-27 NOTE — Telephone Encounter (Signed)
Pt's wife called asking about starting pt back on sinemet, cardiologist recommended it but wanted the pt to check with Dr. Jaynee Eagles first. She would also like to talk about how , when to take the medication. If he should take it or if there is another medication to try. Please call [phone]

## 2016-03-29 NOTE — Telephone Encounter (Signed)
I tried calling patient today left a message. Darryl [name] can you call back? Instructions are still the same as when I last saw them. Sinemet 1/2 pill 3x a day. Space them every 4 hours such as 8am, noon, 4pm.  Try to separate Sinemet from food (especially protein-rich foods like meat, dairy, eggs) by about 30-60 mins - this will help the absorption of the medication. If you have some nausea with the medication, you can take it with some light food like crackers or ginger ale

## 2016-03-30 DIAGNOSIS — G20 Parkinson's disease: Secondary | ICD-10-CM | POA: Diagnosis not present

## 2016-03-30 DIAGNOSIS — N189 Chronic kidney disease, unspecified: Secondary | ICD-10-CM | POA: Diagnosis not present

## 2016-03-30 DIAGNOSIS — I5032 Chronic diastolic (congestive) heart failure: Secondary | ICD-10-CM | POA: Diagnosis not present

## 2016-03-30 DIAGNOSIS — I959 Hypotension, unspecified: Secondary | ICD-10-CM | POA: Diagnosis not present

## 2016-03-30 DIAGNOSIS — D519 Vitamin B12 deficiency anemia, unspecified: Secondary | ICD-10-CM | POA: Diagnosis not present

## 2016-03-30 DIAGNOSIS — I130 Hypertensive heart and chronic kidney disease with heart failure and stage 1 through stage 4 chronic kidney disease, or unspecified chronic kidney disease: Secondary | ICD-10-CM | POA: Diagnosis not present

## 2016-03-30 NOTE — Telephone Encounter (Signed)
[name] returned Darryl [name]'s call, please call [phone].

## 2016-03-30 NOTE — Telephone Encounter (Signed)
Called and LVM for pt/wife to call back. Gave [facility] phone number.

## 2016-03-30 NOTE — Telephone Encounter (Signed)
Dr Jaynee Eagles- FYI  Called and spoke to patient wife. Relayed Dr Cathren Laine message below. She verbalized understanding and repeated instructions correctly. She will call back if they have any further questions or concerns.

## 2016-03-31 ENCOUNTER — Telehealth: Payer: Self-pay | Admitting: Neurology

## 2016-03-31 DIAGNOSIS — N189 Chronic kidney disease, unspecified: Secondary | ICD-10-CM | POA: Diagnosis not present

## 2016-03-31 DIAGNOSIS — G20 Parkinson's disease: Secondary | ICD-10-CM | POA: Diagnosis not present

## 2016-03-31 DIAGNOSIS — I959 Hypotension, unspecified: Secondary | ICD-10-CM | POA: Diagnosis not present

## 2016-03-31 DIAGNOSIS — I130 Hypertensive heart and chronic kidney disease with heart failure and stage 1 through stage 4 chronic kidney disease, or unspecified chronic kidney disease: Secondary | ICD-10-CM | POA: Diagnosis not present

## 2016-03-31 DIAGNOSIS — D519 Vitamin B12 deficiency anemia, unspecified: Secondary | ICD-10-CM | POA: Diagnosis not present

## 2016-03-31 DIAGNOSIS — I5032 Chronic diastolic (congestive) heart failure: Secondary | ICD-10-CM | POA: Diagnosis not present

## 2016-03-31 NOTE — Telephone Encounter (Signed)
Dr Ahern- FYI 

## 2016-03-31 NOTE — Telephone Encounter (Signed)
Darryl [name], OT/[name] called to advise she completed [facility] Occupational Therapy evaluation today, at this time doesn't need further OT.

## 2016-04-02 ENCOUNTER — Encounter: Payer: Self-pay | Admitting: *Deleted

## 2016-04-02 DIAGNOSIS — G20 Parkinson's disease: Secondary | ICD-10-CM | POA: Diagnosis not present

## 2016-04-02 DIAGNOSIS — N189 Chronic kidney disease, unspecified: Secondary | ICD-10-CM | POA: Diagnosis not present

## 2016-04-02 DIAGNOSIS — I130 Hypertensive heart and chronic kidney disease with heart failure and stage 1 through stage 4 chronic kidney disease, or unspecified chronic kidney disease: Secondary | ICD-10-CM | POA: Diagnosis not present

## 2016-04-02 DIAGNOSIS — I959 Hypotension, unspecified: Secondary | ICD-10-CM | POA: Diagnosis not present

## 2016-04-02 DIAGNOSIS — I5032 Chronic diastolic (congestive) heart failure: Secondary | ICD-10-CM | POA: Diagnosis not present

## 2016-04-02 DIAGNOSIS — D519 Vitamin B12 deficiency anemia, unspecified: Secondary | ICD-10-CM | POA: Diagnosis not present

## 2016-04-02 NOTE — Progress Notes (Signed)
Faxed signed order back to [facility] by AA, MD for medication change RE hydralazine. Changed from 25mg  4x/day to 25mg  3x/day. Fax: [phone]. Received confirmation.

## 2016-04-06 ENCOUNTER — Encounter: Payer: Self-pay | Admitting: *Deleted

## 2016-04-06 DIAGNOSIS — D519 Vitamin B12 deficiency anemia, unspecified: Secondary | ICD-10-CM | POA: Diagnosis not present

## 2016-04-06 DIAGNOSIS — N189 Chronic kidney disease, unspecified: Secondary | ICD-10-CM | POA: Diagnosis not present

## 2016-04-06 DIAGNOSIS — I5032 Chronic diastolic (congestive) heart failure: Secondary | ICD-10-CM | POA: Diagnosis not present

## 2016-04-06 DIAGNOSIS — G20 Parkinson's disease: Secondary | ICD-10-CM | POA: Diagnosis not present

## 2016-04-06 DIAGNOSIS — I130 Hypertensive heart and chronic kidney disease with heart failure and stage 1 through stage 4 chronic kidney disease, or unspecified chronic kidney disease: Secondary | ICD-10-CM | POA: Diagnosis not present

## 2016-04-06 DIAGNOSIS — I959 Hypotension, unspecified: Secondary | ICD-10-CM | POA: Diagnosis not present

## 2016-04-06 NOTE — Progress Notes (Signed)
Faxed signed order back to [facility] regarding OT.eval performed and no additional visits required. Fax:[phone]. Received confirmation.

## 2016-04-07 NOTE — Telephone Encounter (Signed)
Darryl [name], he can try taking the medication with crackers or nread and see if this helps.  I can't prescribe new meds over the phone, need to meet to discuss side effects and choices. Have them follow up with an appointment not urgent thanks

## 2016-04-07 NOTE — Telephone Encounter (Signed)
Dr Jaynee Eagles- please advise. Wife aware you are out until tomorrow.  Called wife back. She stated patient still having stomach pains from sinemet. She states he is refusing to take medication. She states he is stating " I am not going to take something that causes my stomach to hurt". She is wondering if there is another medication they can try that won't cause SE. I did advise there is no guarantee that a medication will not cause SE. Everyone responds a little different to medications. Or if Dr Jaynee Eagles wants them to wait till next f/u to discuss.   Advised Dr Jaynee Eagles out of the office until tomorrow. She is okay with waiting to get an answer. Advised I will call back once I receive an answer from Dr Jaynee Eagles.  She is going to apply for handicap sticker for pt. She is going to drop off Thursday for Dr Jaynee Eagles to review/fill out. Advised Dr Jaynee Eagles not in the office on Friday. It can be ready next week for pick up. She was okay with this. She asked me to call once ready to pick up. She will pick up in office at front.

## 2016-04-07 NOTE — Telephone Encounter (Signed)
Wife Darryl [name] called requesting to speak with nurse regarding Sinemet, please call [phone].

## 2016-04-08 NOTE — Telephone Encounter (Signed)
Called and spoke to Darryl [name], wife. Relayed Dr Cathren Laine message below. She verbalized understanding. Advised f/u not until 1/18. Offered to move up. She states she wants to speak to husband first and will call back after she speaks with him to r/s to sooner appt if needed.

## 2016-04-09 DIAGNOSIS — G20 Parkinson's disease: Secondary | ICD-10-CM | POA: Diagnosis not present

## 2016-04-09 DIAGNOSIS — D519 Vitamin B12 deficiency anemia, unspecified: Secondary | ICD-10-CM | POA: Diagnosis not present

## 2016-04-09 DIAGNOSIS — I130 Hypertensive heart and chronic kidney disease with heart failure and stage 1 through stage 4 chronic kidney disease, or unspecified chronic kidney disease: Secondary | ICD-10-CM | POA: Diagnosis not present

## 2016-04-09 DIAGNOSIS — N189 Chronic kidney disease, unspecified: Secondary | ICD-10-CM | POA: Diagnosis not present

## 2016-04-09 DIAGNOSIS — I5032 Chronic diastolic (congestive) heart failure: Secondary | ICD-10-CM | POA: Diagnosis not present

## 2016-04-09 DIAGNOSIS — I959 Hypotension, unspecified: Secondary | ICD-10-CM | POA: Diagnosis not present

## 2016-04-09 DIAGNOSIS — Z23 Encounter for immunization: Secondary | ICD-10-CM | POA: Diagnosis not present

## 2016-04-10 NOTE — Telephone Encounter (Signed)
Called and spoke to wife. Advised completed application for parking placard is ready. She is with granddaughter today and will pick up on Monday around [place] will place up front for pick up. She verbalized understanding.   She also stated they will keep appt scheduled. She will call back if they want to r/s to earlier appt.

## 2016-04-13 ENCOUNTER — Telehealth: Payer: Self-pay | Admitting: Neurology

## 2016-04-13 DIAGNOSIS — D519 Vitamin B12 deficiency anemia, unspecified: Secondary | ICD-10-CM | POA: Diagnosis not present

## 2016-04-13 DIAGNOSIS — I5032 Chronic diastolic (congestive) heart failure: Secondary | ICD-10-CM | POA: Diagnosis not present

## 2016-04-13 DIAGNOSIS — G20 Parkinson's disease: Secondary | ICD-10-CM | POA: Diagnosis not present

## 2016-04-13 DIAGNOSIS — I959 Hypotension, unspecified: Secondary | ICD-10-CM | POA: Diagnosis not present

## 2016-04-13 DIAGNOSIS — N189 Chronic kidney disease, unspecified: Secondary | ICD-10-CM | POA: Diagnosis not present

## 2016-04-13 DIAGNOSIS — I130 Hypertensive heart and chronic kidney disease with heart failure and stage 1 through stage 4 chronic kidney disease, or unspecified chronic kidney disease: Secondary | ICD-10-CM | POA: Diagnosis not present

## 2016-04-13 NOTE — Telephone Encounter (Signed)
Called and gave VO to [place] to continue PT per Dr Krista Blue. Advised Dr Jaynee Eagles out of the office this week. She verbalized understanding.

## 2016-04-13 NOTE — Telephone Encounter (Signed)
Per Dr Krista Blue- okay to give verbal order for PT as requested.

## 2016-04-13 NOTE — Telephone Encounter (Signed)
Monique/ [name] - verbal order for: continue pt 2 x week for 3 weeks, for improved trunk and lower lateral extremity flexibility, gait /balance deficits and LSET home exercise program. [phone]

## 2016-04-16 DIAGNOSIS — G20 Parkinson's disease: Secondary | ICD-10-CM | POA: Diagnosis not present

## 2016-04-16 DIAGNOSIS — I5032 Chronic diastolic (congestive) heart failure: Secondary | ICD-10-CM | POA: Diagnosis not present

## 2016-04-16 DIAGNOSIS — N189 Chronic kidney disease, unspecified: Secondary | ICD-10-CM | POA: Diagnosis not present

## 2016-04-16 DIAGNOSIS — I130 Hypertensive heart and chronic kidney disease with heart failure and stage 1 through stage 4 chronic kidney disease, or unspecified chronic kidney disease: Secondary | ICD-10-CM | POA: Diagnosis not present

## 2016-04-16 DIAGNOSIS — D519 Vitamin B12 deficiency anemia, unspecified: Secondary | ICD-10-CM | POA: Diagnosis not present

## 2016-04-16 DIAGNOSIS — I959 Hypotension, unspecified: Secondary | ICD-10-CM | POA: Diagnosis not present

## 2016-04-20 DIAGNOSIS — D519 Vitamin B12 deficiency anemia, unspecified: Secondary | ICD-10-CM | POA: Diagnosis not present

## 2016-04-20 DIAGNOSIS — G20 Parkinson's disease: Secondary | ICD-10-CM | POA: Diagnosis not present

## 2016-04-20 DIAGNOSIS — N189 Chronic kidney disease, unspecified: Secondary | ICD-10-CM | POA: Diagnosis not present

## 2016-04-20 DIAGNOSIS — I959 Hypotension, unspecified: Secondary | ICD-10-CM | POA: Diagnosis not present

## 2016-04-20 DIAGNOSIS — I5032 Chronic diastolic (congestive) heart failure: Secondary | ICD-10-CM | POA: Diagnosis not present

## 2016-04-20 DIAGNOSIS — I130 Hypertensive heart and chronic kidney disease with heart failure and stage 1 through stage 4 chronic kidney disease, or unspecified chronic kidney disease: Secondary | ICD-10-CM | POA: Diagnosis not present

## 2016-04-21 NOTE — Telephone Encounter (Signed)
Faxed signed/printed order by AA, MD back to [facility] to extend treatment for PT 2W3 to complete POC and goals. Fax: [phone]. Received confirmation.

## 2016-04-24 DIAGNOSIS — G20 Parkinson's disease: Secondary | ICD-10-CM | POA: Diagnosis not present

## 2016-04-24 DIAGNOSIS — D519 Vitamin B12 deficiency anemia, unspecified: Secondary | ICD-10-CM | POA: Diagnosis not present

## 2016-04-24 DIAGNOSIS — I959 Hypotension, unspecified: Secondary | ICD-10-CM | POA: Diagnosis not present

## 2016-04-24 DIAGNOSIS — N189 Chronic kidney disease, unspecified: Secondary | ICD-10-CM | POA: Diagnosis not present

## 2016-04-24 DIAGNOSIS — I130 Hypertensive heart and chronic kidney disease with heart failure and stage 1 through stage 4 chronic kidney disease, or unspecified chronic kidney disease: Secondary | ICD-10-CM | POA: Diagnosis not present

## 2016-04-24 DIAGNOSIS — I5032 Chronic diastolic (congestive) heart failure: Secondary | ICD-10-CM | POA: Diagnosis not present

## 2016-04-27 DIAGNOSIS — I130 Hypertensive heart and chronic kidney disease with heart failure and stage 1 through stage 4 chronic kidney disease, or unspecified chronic kidney disease: Secondary | ICD-10-CM | POA: Diagnosis not present

## 2016-04-27 DIAGNOSIS — I959 Hypotension, unspecified: Secondary | ICD-10-CM | POA: Diagnosis not present

## 2016-04-27 DIAGNOSIS — I5032 Chronic diastolic (congestive) heart failure: Secondary | ICD-10-CM | POA: Diagnosis not present

## 2016-04-27 DIAGNOSIS — N189 Chronic kidney disease, unspecified: Secondary | ICD-10-CM | POA: Diagnosis not present

## 2016-04-27 DIAGNOSIS — G20 Parkinson's disease: Secondary | ICD-10-CM | POA: Diagnosis not present

## 2016-04-27 DIAGNOSIS — D519 Vitamin B12 deficiency anemia, unspecified: Secondary | ICD-10-CM | POA: Diagnosis not present

## 2016-04-30 DIAGNOSIS — I130 Hypertensive heart and chronic kidney disease with heart failure and stage 1 through stage 4 chronic kidney disease, or unspecified chronic kidney disease: Secondary | ICD-10-CM | POA: Diagnosis not present

## 2016-04-30 DIAGNOSIS — G20 Parkinson's disease: Secondary | ICD-10-CM | POA: Diagnosis not present

## 2016-04-30 DIAGNOSIS — I5032 Chronic diastolic (congestive) heart failure: Secondary | ICD-10-CM | POA: Diagnosis not present

## 2016-04-30 DIAGNOSIS — N189 Chronic kidney disease, unspecified: Secondary | ICD-10-CM | POA: Diagnosis not present

## 2016-04-30 DIAGNOSIS — I959 Hypotension, unspecified: Secondary | ICD-10-CM | POA: Diagnosis not present

## 2016-04-30 DIAGNOSIS — D519 Vitamin B12 deficiency anemia, unspecified: Secondary | ICD-10-CM | POA: Diagnosis not present

## 2016-05-07 ENCOUNTER — Telehealth: Payer: Self-pay

## 2016-05-07 ENCOUNTER — Ambulatory Visit (HOSPITAL_COMMUNITY): Payer: Medicare Other | Attending: Cardiology

## 2016-05-07 ENCOUNTER — Other Ambulatory Visit: Payer: Self-pay

## 2016-05-07 DIAGNOSIS — I447 Left bundle-branch block, unspecified: Secondary | ICD-10-CM | POA: Diagnosis not present

## 2016-05-07 DIAGNOSIS — I77810 Thoracic aortic ectasia: Secondary | ICD-10-CM

## 2016-05-07 DIAGNOSIS — I5032 Chronic diastolic (congestive) heart failure: Secondary | ICD-10-CM

## 2016-05-07 DIAGNOSIS — I110 Hypertensive heart disease with heart failure: Secondary | ICD-10-CM

## 2016-05-07 DIAGNOSIS — I5189 Other ill-defined heart diseases: Secondary | ICD-10-CM

## 2016-05-07 NOTE — Telephone Encounter (Signed)
-----   Message from Sueanne Margarita, MD sent at 05/07/2016  4:06 PM EST ----- Echo showed normal LVF with borderline dilated ascending aorta at 73mm.   Repeat echo in 1 year for aortic root

## 2016-05-07 NOTE — Telephone Encounter (Signed)
Informed patient's DPR of results and verbal understanding expressed.   Repeat ECHO ordered to be scheduled in 1 year. Patient agrees with treatment plan.

## 2016-05-25 ENCOUNTER — Encounter: Payer: Self-pay | Admitting: Cardiology

## 2016-05-26 DIAGNOSIS — I10 Essential (primary) hypertension: Secondary | ICD-10-CM | POA: Diagnosis not present

## 2016-05-26 DIAGNOSIS — M47817 Spondylosis without myelopathy or radiculopathy, lumbosacral region: Secondary | ICD-10-CM | POA: Diagnosis not present

## 2016-06-01 ENCOUNTER — Encounter: Payer: Self-pay | Admitting: Cardiology

## 2016-06-01 ENCOUNTER — Ambulatory Visit (INDEPENDENT_AMBULATORY_CARE_PROVIDER_SITE_OTHER): Payer: Medicare Other | Admitting: Cardiology

## 2016-06-01 VITALS — BP 130/82 | HR 74 | Ht 68.5 in | Wt 194.8 lb

## 2016-06-01 DIAGNOSIS — I10 Essential (primary) hypertension: Secondary | ICD-10-CM

## 2016-06-01 DIAGNOSIS — I5032 Chronic diastolic (congestive) heart failure: Secondary | ICD-10-CM

## 2016-06-01 DIAGNOSIS — I77810 Thoracic aortic ectasia: Secondary | ICD-10-CM | POA: Diagnosis not present

## 2016-06-01 DIAGNOSIS — I110 Hypertensive heart disease with heart failure: Secondary | ICD-10-CM

## 2016-06-01 DIAGNOSIS — I2510 Atherosclerotic heart disease of native coronary artery without angina pectoris: Secondary | ICD-10-CM | POA: Diagnosis not present

## 2016-06-01 DIAGNOSIS — I951 Orthostatic hypotension: Secondary | ICD-10-CM

## 2016-06-01 DIAGNOSIS — E785 Hyperlipidemia, unspecified: Secondary | ICD-10-CM

## 2016-06-01 MED ORDER — CARVEDILOL 6.25 MG PO TABS: 6.2500 mg | ORAL_TABLET | Freq: Two times a day (BID) | ORAL | 3 refills | Status: DC

## 2016-06-01 NOTE — Progress Notes (Signed)
Cardiology Office Note    Date:  06/01/2016   ID:  Darryl [name], DOB Oct 21, 1934, MRN [id number]  PCP:  Tawanna Solo, MD  Cardiologist:  [name], MD   Chief Complaint  Patient presents with  . Coronary Artery Disease  . Hypertension  . Hyperlipidemia    History of Present Illness:  Darryl [name] is a 81 y.o. male with a history of CAD/HTN/chronic LBBB/dyslipidemia and chronic diastolic CHF who presents today for followup of his HTN.  He denies any chest pain, SOB, DOE, LE edema, dizziness, palpitations or syncope.  He walks for exercise but is limited by arthritic back problems and his Parkinsons.  He fell recently so he is back to using his walker.    Past Medical History:  Diagnosis Date  . Arthritis   . BPH (benign prostatic hypertrophy) with urinary retention   . Chronic diastolic CHF (congestive heart failure) ([place])   . Colon polyp   . Coronary atherosclerosis of native coronary artery    a. s/p CABG 11/2003. b. s/p DES to radial-OM and SVG-RCA 11/05. c. s/p BMS to LCx 11/2008.  . Dilated aortic root ([place]) 05/29/2014  . Diverticulosis   . Dyslipidemia    statin intolerant  . Elevated fasting glucose   . Gastritis   . Gastritis    a. by EGD 04/2004.  . H/O urinary retention    post surgical procedures; has foley cath  . Headache(784.0)   . Hiatal hernia   . HOH (hard of hearing)    slight hoh, rt>l  . HTN (hypertension)   . Hypertensive heart disease   . Insomnia   . Intracranial hemorrhage ([place])    a. small pontine [place] 2003.  [name] LBBB (left bundle branch block)   . Lumbar spondylolysis    multilevel DJD with mild spinal stenosis L3-L5  . Migraines   . Parkinson's disease ([place])   . Peptic ulcer disease    a. prior hx of bleeding ulcers, including 12/2003 in setting of aspirin use.  . Renal insufficiency    renal duplex scan in 2012 showed no renal artery stenosis  . Sleep disturbance   . Stroke [facility])    a. remote hx of stroke reportedly  r/t HTN.  [name] Thrombocytopenia ([place])   . Tremor   . UTI (urinary tract infection)   . Wears glasses     Past Surgical History:  Procedure Laterality Date  . APPENDECTOMY  62   1963  . CHOLECYSTECTOMY  1998  . COLON SURGERY  03/14/2008  . COLONOSCOPY  10/2001  . CORONARY ANGIOPLASTY  6/10,10/05   Dr. [name] cardiologist: last office visit 11/07/14  . CORONARY ARTERY BYPASS GRAFT  2005  . CYSTOSCOPY WITH LITHOLAPAXY N/A 11/19/2014   Procedure: CYSTOSCOPY WITH LITHOLAPAXY;  Surgeon: [name], MD;  Location: [facility];  Service: Urology;  Laterality: N/A;  . ESOPHAGOGASTRODUODENOSCOPY     10/2001, 12/2003, 04/2004  . LUMBAR LAMINECTOMY/DECOMPRESSION MICRODISCECTOMY N/A 05/03/2013   Procedure: LUMBAR LAMINECTOMY/DCOMPRESSION MICRODISCECTOMY RIGHT LUMBAR FOUR FIVE;  Surgeon: [name], MD;  Location: [facility];  Service: Neurosurgery;  Laterality: N/A;  . SHOULDER ARTHROSCOPY Left 9/10  . stones     hx  . TRANSURETHRAL RESECTION OF PROSTATE N/A 11/19/2014   Procedure: TRANSURETHRAL RESECTION OF THE PROSTATE WITH GYRUS INSTRUMENTS;  Surgeon: [name], MD;  [facility];  Service: Urology;  Laterality: N/A;  . WISDOM TOOTH EXTRACTION  Current Medications: Outpatient Medications Prior to Visit  Medication Sig Dispense Refill  . acetaminophen (TYLENOL) 500 MG tablet Take 1,000 mg by mouth every 8 (eight) hours as needed for mild pain or moderate pain.    [name] aspirin EC 81 MG tablet Take 81 mg by mouth. Patient takes 3 times a week    . carvedilol (COREG) 6.25 MG tablet Take 1 tablet (6.25 mg total) by mouth 2 (two) times daily. 90 tablet 3  . citalopram (CELEXA) 20 MG tablet Take 20 mg by mouth daily.    . cloNIDine (CATAPRES) 0.2 MG tablet Take 0.2-0.3 mg by mouth 2 (two) times daily. Takes 1.5 tablet in am and 1.5 tablets in PM daily    . furosemide (LASIX) 20 MG tablet Take 1 tablet (20 mg total) by mouth daily. 90 tablet 3  .  hydrALAZINE (APRESOLINE) 25 MG tablet Take 1 tablet (25 mg total) by mouth 3 (three) times daily. 360 tablet 3  . irbesartan (AVAPRO) 300 MG tablet Take 300 mg by mouth every morning.    [name] KLOR-CON 10 10 MEQ tablet Take 1 tablet (10 mEq total) by mouth daily. 90 tablet 3  . nitroGLYCERIN (NITROSTAT) 0.4 MG SL tablet Place 1 tablet (0.4 mg total) under the tongue every 5 (five) minutes as needed for chest pain. 25 tablet 12  . Omega-3 Fatty Acids (FISH OIL PO) Take 800 mg by mouth daily. Carlson's Liquid    . pantoprazole (PROTONIX) 40 MG tablet Take 40 mg by mouth as directed.     . traMADol (ULTRAM) 50 MG tablet Take 50 mg by mouth daily as needed (pain).      No facility-administered medications prior to visit.      Allergies:   Ambien [zolpidem tartrate]; Aspirin; Celebrex [celecoxib]; Nsaids; Toprol xl [metoprolol tartrate]; Altace [ramipril]; Amlodipine; Levothyroxine; Biaxin [clarithromycin]; and Tetracyclines & related   Social History   Social History  . Marital status: Married    Spouse name: [name]  . Number of children: 4  . Years of education: College   Social History Main Topics  . Smoking status: Former Smoker    Packs/day: 2.00    Years: 5.00    Types: Cigarettes    Quit date: 04/28/1959  . Smokeless tobacco: Never Used     Comment: 55+ yrs ago  . Alcohol use No  . Drug use: No  . Sexual activity: Not Asked   Other Topics Concern  . None   Social History Narrative   Patient is married to [name]), has 4 children   Patient is right handed   Education level is college   Caffeine consumption is none     Family History:  The patient's family history includes CAD in his sister; CAD (age of onset: 62) in his father.   ROS:   Please see the history of present illness.    ROS All other systems reviewed and are negative.  No flowsheet data found.     PHYSICAL EXAM:   VS:  BP 130/82   Pulse 74   Ht 5' 8.5" (1.74 m)   Wt 194 lb 12.8 oz (88.4 kg)   SpO2  96%   BMI 29.19 kg/m    GEN: Well nourished, well developed, in no acute distress  HEENT: normal  Neck: no JVD, carotid bruits, or masses Cardiac: RRR; no rubs, or gallops,no edema.  Intact distal pulses bilaterally. 2/6 SY at LLSB to apex Respiratory:  clear to auscultation bilaterally, normal work of  breathing GI: soft, nontender, nondistended, + BS MS: no deformity or atrophy  Skin: warm and dry, no rash Neuro:  Alert and Oriented x 3, Strength and sensation are intact Psych: euthymic mood, full affect  Wt Readings from Last 3 Encounters:  06/01/16 194 lb 12.8 oz (88.4 kg)  03/26/16 194 lb 1.9 oz (88.1 kg)  03/12/16 197 lb (89.4 kg)      Studies/Labs Reviewed:   EKG:  EKG is ordered today.   Recent Labs: 06/10/2015: Creatinine, Ser 1.43 03/12/2016: TSH 4.580   Lipid Panel    Component Value Date/Time   CHOL 147 12/06/2014 0757   TRIG 201.0 (H) 12/06/2014 0757   HDL 29.40 (L) 12/06/2014 0757   CHOLHDL 5 12/06/2014 0757   VLDL 40.2 (H) 12/06/2014 0757   LDLDIRECT 42.0 12/06/2014 0757    Additional studies/ records that were reviewed today include:  none    ASSESSMENT:    1. Coronary artery disease involving native coronary artery of native heart without angina pectoris   2. Chronic diastolic heart failure ([place])   3. Dilated aortic root ([place])   4. Benign essential HTN   5. Orthostatic hypotension   6. Dyslipidemia      PLAN:  In order of problems listed above:  1. ASCAD s/p CABG 11/2003 and subsequent  DES to radial-OM and SVG-RCA 11/05 and s/p BMS to LCx 11/2008.  He has not had any anginal symptoms.  Continue ASA/BB. 2. Chronic diastolic CHF - he appears euvolemic on exam today.  Continue BB/diuretic.  Check BMET. 3. Dilated aortic root - repeat limited echo in 1 year. 4. HTN - BP controlled on current meds.  Continue B/Clonidine/hydralazine/ARB 5. Orthostatic hypotension secondary to underlying Parkinsons disease 6. Dyslipidemia with LDL goal < 70.  He  is statin intolerant. Check FLP and ALT.      Medication Adjustments/Labs and Tests Ordered: Current medicines are reviewed at length with the patient today.  Concerns regarding medicines are outlined above.  Medication changes, Labs and Tests ordered today are listed in the Patient Instructions below.  There are no Patient Instructions on file for this visit.   Signed, [name], MD  06/01/2016 10:27 AM    [address], [place], [place]  [address] Phone: [phone]; Fax: [phone]

## 2016-06-01 NOTE — Addendum Note (Signed)
Addended by: Harland German A on: 06/01/2016 11:42 AM   Modules accepted: Orders

## 2016-06-01 NOTE — Patient Instructions (Signed)
Medication Instructions:  Your physician recommends that you continue on your current medications as directed. Please refer to the Current Medication list given to you today.   Labwork: TODAY: BMET  FASTING LABS SOON: LFTs, Lipids  Testing/Procedures: None  Follow-Up: Your physician wants you to follow-up in: 6 months with Dr. Radford Pax. You will receive a reminder letter in the mail two months in advance. If you don't receive a letter, please call our office to schedule the follow-up appointment.   Any Other Special Instructions Will Be Listed Below (If Applicable).     If you need a refill on your cardiac medications before your next appointment, please call your pharmacy.

## 2016-06-04 ENCOUNTER — Other Ambulatory Visit: Payer: Medicare Other | Admitting: *Deleted

## 2016-06-04 DIAGNOSIS — I2510 Atherosclerotic heart disease of native coronary artery without angina pectoris: Secondary | ICD-10-CM

## 2016-06-04 DIAGNOSIS — I110 Hypertensive heart disease with heart failure: Secondary | ICD-10-CM

## 2016-06-04 DIAGNOSIS — E785 Hyperlipidemia, unspecified: Secondary | ICD-10-CM

## 2016-06-04 DIAGNOSIS — I10 Essential (primary) hypertension: Secondary | ICD-10-CM

## 2016-06-04 DIAGNOSIS — I5032 Chronic diastolic (congestive) heart failure: Secondary | ICD-10-CM | POA: Diagnosis not present

## 2016-06-04 LAB — BASIC METABOLIC PANEL
BUN: 21 mg/dL (ref 7–25)
CALCIUM: 9.6 mg/dL (ref 8.6–10.3)
CHLORIDE: 107 mmol/L (ref 98–110)
CO2: 28 mmol/L (ref 20–31)
CREATININE: 1.41 mg/dL — AB (ref 0.70–1.18)
GLUCOSE: 125 mg/dL — AB (ref 65–99)
Potassium: 4.2 mmol/L (ref 3.5–5.3)
Sodium: 142 mmol/L (ref 135–146)

## 2016-06-04 LAB — LIPID PANEL
Cholesterol: 150 mg/dL (ref <200)
HDL: 31 mg/dL — AB (ref >40)
LDL CALC: 50 mg/dL (ref <100)
TRIGLYCERIDES: 345 mg/dL — AB (ref <150)
Total CHOL/HDL Ratio: 4.8 Ratio (ref <5.0)
VLDL: 69 mg/dL — ABNORMAL HIGH (ref <30)

## 2016-06-04 LAB — HEPATIC FUNCTION PANEL
ALBUMIN: 4.2 g/dL (ref 3.6–5.1)
ALT: 13 U/L (ref 9–46)
AST: 16 U/L (ref 10–35)
Alkaline Phosphatase: 88 U/L (ref 40–115)
BILIRUBIN INDIRECT: 0.6 mg/dL (ref 0.2–1.2)
Bilirubin, Direct: 0.1 mg/dL (ref <=0.2)
TOTAL PROTEIN: 6.8 g/dL (ref 6.1–8.1)
Total Bilirubin: 0.7 mg/dL (ref 0.2–1.2)

## 2016-06-09 ENCOUNTER — Encounter: Payer: Self-pay | Admitting: Cardiology

## 2016-06-09 NOTE — Telephone Encounter (Signed)
New message ° ° ° ° °Returning a call to the nurse °

## 2016-06-09 NOTE — Telephone Encounter (Signed)
This encounter was created in error - please disregard.

## 2016-06-10 ENCOUNTER — Telehealth: Payer: Self-pay | Admitting: Cardiology

## 2016-06-10 NOTE — Telephone Encounter (Signed)
Informed DPR that the dosage of medicine, not fluid amount, will need to be reported to adjust medication. Will call her tomorrow to verify.

## 2016-06-10 NOTE — Telephone Encounter (Signed)
Follow-up      Dr Radford Pax wanted pt to increase fish oil doseage. Wife calling back, Dr. Radford Pax wanted to know the equivalent of medicine he was taking. 1 tsp= 5 ml Pt currently takes 3 tbsp  Want to know how much he needs to take now?

## 2016-06-11 NOTE — Telephone Encounter (Signed)
Patient's wife confirms he is taking Carlson's Omega 3 fish oil 3 tablespoons daily.  Per nutrition information, a serving is 1 teaspoon with 1600 mg omega 3 fatty acids.   She understands she will be called with further instruction.

## 2016-06-16 MED ORDER — THE VERY FINEST FISH OIL PO LIQD: ORAL | Status: DC

## 2016-06-16 NOTE — Telephone Encounter (Signed)
-----   Message from Leeroy Bock, [facility] sent at 06/11/2016  4:07 PM EST ----- Carlson's omega 3 contains 1,600mg  omega 3 per 1 teaspoon. If patient is taking 3 tablespoons of fish oil, that is equivalent to 14.4 grams of fish oil daily. Pt is taking way too much, max dose typically used for high TG is 4g. Please advise pt to cut back dose to 2.5 teaspoons, not tablespoons.

## 2016-06-16 NOTE — Telephone Encounter (Signed)
Follow up    Pt wife verbalized that she is returning call for rn

## 2016-06-16 NOTE — Telephone Encounter (Signed)
Instructed patient's wife to DECREASE FISH OIL to 2.5 teaspoons daily. She states he has a physical at his PCP early March and will have labs forwarded to Dr. Radford Pax after they are drawn. She was grateful for call.

## 2016-07-09 ENCOUNTER — Telehealth: Payer: Self-pay | Admitting: Cardiology

## 2016-07-09 DIAGNOSIS — I110 Hypertensive heart disease with heart failure: Secondary | ICD-10-CM

## 2016-07-09 MED ORDER — HYDRALAZINE HCL 25 MG PO TABS: 25.0000 mg | ORAL_TABLET | Freq: Three times a day (TID) | ORAL | 3 refills | Status: DC

## 2016-07-09 MED ORDER — FUROSEMIDE 20 MG PO TABS: 20.0000 mg | ORAL_TABLET | Freq: Every day | ORAL | 3 refills | Status: DC

## 2016-07-09 MED ORDER — CARVEDILOL 6.25 MG PO TABS: 6.2500 mg | ORAL_TABLET | Freq: Two times a day (BID) | ORAL | 3 refills | Status: DC

## 2016-07-09 MED ORDER — KLOR-CON 10 10 MEQ PO TBCR: 10.0000 meq | EXTENDED_RELEASE_TABLET | Freq: Every day | ORAL | 3 refills | Status: DC

## 2016-07-09 NOTE — Telephone Encounter (Signed)
°  New Prob  New Prescriptions needed for the below medications. Pt is switching pharmacies:  carvedilol (COREG) 6.25 MG tablet hydrALAZINE (APRESOLINE) 25 MG tablet KLOR-CON 10 10 MEQ tablet furosemide (LASIX) 20 MG tablet  90 day supply for all medications   Pharmacy: [facility] (Phone) [phone] mail order

## 2016-07-13 ENCOUNTER — Encounter: Payer: Self-pay | Admitting: Neurology

## 2016-07-13 ENCOUNTER — Ambulatory Visit (INDEPENDENT_AMBULATORY_CARE_PROVIDER_SITE_OTHER): Payer: Medicare Other | Admitting: Neurology

## 2016-07-13 VITALS — BP 119/81 | HR 67 | Ht 68.5 in | Wt 196.2 lb

## 2016-07-13 DIAGNOSIS — W19XXXA Unspecified fall, initial encounter: Secondary | ICD-10-CM

## 2016-07-13 DIAGNOSIS — G20 Parkinson's disease: Secondary | ICD-10-CM | POA: Diagnosis not present

## 2016-07-13 DIAGNOSIS — E538 Deficiency of other specified B group vitamins: Secondary | ICD-10-CM | POA: Diagnosis not present

## 2016-07-13 MED ORDER — CARBIDOPA-LEVODOPA ER 23.75-95 MG PO CPCR: 1.0000 | ORAL_CAPSULE | Freq: Three times a day (TID) | ORAL | 0 refills | Status: DC

## 2016-07-13 NOTE — Progress Notes (Signed)
Darryl [name]    Provider:  Dr Jaynee [name] Referring Provider: [name], MD Primary Care Physician:  Darryl Solo, MD  CC: Parkinsonism   Interval history 07/13/2016: Patient is here for parkinsonism, idiopathic parkinson's disease vs vascular parkinsonism. Started him on Sinemet but he did not tolerate. His tremors are improved. He refuses to use his walker, I encouraged him to use it especially since he recently fell. PT in their home was "awesome". He has not been taking his B12, I discussed again he has B12 deficiency and he should be taking B12. Discussed Parkinson;s disease vs parkinsonism wife wife, answered all questions. He is falling more, will request home PT again for decline.  Interval history 03/13/2016: He has chronic low back pain, stiffness in the low back, and shoots down the right leg. Constant aching in the right leg. Had about 20 shots in the low back. His legs are getting weaker, shuffling. He goes to [name]. They deny any memory changes, they declined an MMSE, advised if he is having memory loss we could start medication that could slow down memory loss. Tremor is stable, mostly on the right, none in the left. Volume of voice is getting lower. He has some choking with food. I recommended a swallow eval for aspiration but they declined. Denies REM sleep disorder. No snoring, no witnessed apneic events. No drooling or wet pillows or increased saliva. He is shuffling, he fell in July after walking in the yard and he couldn't stop. He is supposed to use a walker but they don;t use it, will request [facility] for PT. No loss of taste or smell. No incontinence. Handwriting is sloppy, looks like a 76-year old. When he stands up he has to stand for a [name] minutes. No freezing.   He has significant white-matter changes in the brain; extensive changes of chronic microvascular disease and scattered multiple microhemorrhages of remote age.  Reviewed images with patient and wife again today.   Darryl S Teacheyis a 82 y.o.malehere as a referral from Darryl. Alcide Clever Parkinson's disease  He has been having urinary retention. He has a foley. He developed urinary tract infection and sepsis. Been trying to take the foley out, but he cannot urinate. He has an appointment for urodynamics and being treated for enlarged prostate. Urologist wanted him evaluated for urinary retention in relation to PD. He has been having urinary difficulty since 2005. This has happened in the past in 2005 after surgery, then in 2010 after syrgery where he had to wear a foley until he could urinate again. Then in November of 2014 after back surgery. This time his urinary retention happened after epidural steroid injections for LBP. Patient has been followed in clinic for PD but has declined medication in the past as he feels his symptoms are not severe enough and manageable without medications.  Interval Update 06/12/2014: Tremor is stable. Sometimes you can see it and other times it is not there. No problems eating, dressing. Right is worse so needs to use left hand. Right >>left. Handwriting is getting worse. Walking is getting a little slower. Has been working with the cardiologist for tight blood pressure control. No muscle stiffness. No falls. Not using a walking aid. No REM sleep disorder. Sense of smell is fine. Has constipation since starting the celexa for anxiety. No orthostatic dizzines.   12/12/2013 Darryl. Janann Colonel: Darryl [name] is a 82 y.o. male here as a follow up from Darryl. Rex Kras for tremor  evaluation. Last visit was 08/2013 at which time there was concern over possible amyloid angiopathy. He was evaluated by Darryl Leonie Man with findings likely related to chronic HTN/small vessel disease. His ASA 81mg  was changed to every other day due to bruising. Overall doing well. Continues to have difficulties with his blood pressure. Currently working with Darryl Darryl [name] (cardiology).   The tremor is stable, no worsening, remains stable. He currently reports some pain radiating from lower back down the posterior surface of his right leg. He has a history of lumbar disc herniation s/p surgery.    Initial visit 07/2013 Darryl. Janann Colonel: First noticed tremor around 3 years ago, started on right but now involves both hands but is R>L. Described as both a rest and action/postural tremor. Bothers him the most when trying to eat. Handwriting has gotten messier, no change in size. Wife notes some generalized slowing down, some shuffling of his feet. No muscle stiffness or cramps. He does not some gait instability, has some trouble getting in and out of the car. Feels very unsteady going down stairs. Has not noticed anything that makes the tremor worse. No intake of caffeine or EtOH. Sleeps well, wife notes some REM behavior disorder (talking in his sleep). No known difficulty with sense of smell. No known stroke or TIA, has had CAD with CABG x 4.   He thinks his father may have had a tremor.   Reviewed notes, labs and imaging from outside physicians, which showed: MRI of the brain 07/2013: Abnormal MRI scan of the brain showing extensive changes of chronic microvascular disease and scattered multiple microhemorrhages of remote age. Possibilities include amyloid angiopathy versus small vessel disease.   Review of Systems: Patient complains of symptoms per HPI as well as the following symptoms: Jevity change, difficulty urinating, headache, tremors, passing out, ringing in ears, joint pain, back pain, walking difficulty, restless legs, daytime sleepiness. Pertinent negatives per HPI. All others negative.   Social History   Social History  . Marital status: Married    Spouse name: [name]  . Number of children: 4  . Years of education: College   Occupational History  . Not on file.   Social History Main Topics  . Smoking status: Former Smoker    Packs/day:  2.00    Years: 5.00    Types: Cigarettes    Quit date: 04/28/1959  . Smokeless tobacco: Never Used     Comment: 55+ yrs ago  . Alcohol use No  . Drug use: No  . Sexual activity: Not on file   Other Topics Concern  . Not on file   Social History Narrative   Patient is married to [name]), has 4 children   Patient is right handed   Education level is college   Caffeine consumption is none    Family History  Problem Relation Age of Onset  . CAD Father 7    ASCAD  . CAD Sister     Past Medical History:  Diagnosis Date  . Arthritis   . BPH (benign prostatic hypertrophy) with urinary retention   . Chronic diastolic CHF (congestive heart failure) ([place])   . Colon polyp   . Coronary atherosclerosis of native coronary artery    a. s/p CABG 11/2003. b. s/p DES to radial-OM and SVG-RCA 11/05. c. s/p BMS to LCx 11/2008.  . Dilated aortic root ([place]) 05/29/2014  . Diverticulosis   . Dyslipidemia    statin intolerant  . Elevated fasting glucose   .  Gastritis   . Gastritis    a. by EGD 04/2004.  . H/O urinary retention    post surgical procedures; has foley cath  . Headache(784.0)   . Hiatal hernia   . HOH (hard of hearing)    slight hoh, rt>l  . HTN (hypertension)   . Hypertensive heart disease   . Insomnia   . Intracranial hemorrhage ([place])    a. small pontine [place] 2003.  [name] LBBB (left bundle branch block)   . Lumbar spondylolysis    multilevel DJD with mild spinal stenosis L3-L5  . Migraines   . Parkinson's disease ([place])   . Peptic ulcer disease    a. prior hx of bleeding ulcers, including 12/2003 in setting of aspirin use.  . Renal insufficiency    renal duplex scan in 2012 showed no renal artery stenosis  . Sleep disturbance   . Stroke [name])    a. remote hx of stroke reportedly r/t HTN.  [name] Thrombocytopenia ([place])   . Tremor   . UTI (urinary tract infection)   . Wears glasses     Past Surgical History:  Procedure Laterality Date  . APPENDECTOMY  62   1963  .  CHOLECYSTECTOMY  1998  . COLON SURGERY  03/14/2008  . COLONOSCOPY  10/2001  . CORONARY ANGIOPLASTY  6/10,10/05   Darryl. [name] cardiologist: last office visit 11/07/14  . CORONARY ARTERY BYPASS GRAFT  2005  . CYSTOSCOPY WITH LITHOLAPAXY N/A 11/19/2014   Procedure: CYSTOSCOPY WITH LITHOLAPAXY;  Surgeon: [name], MD;  Location[name];  Service: Urology;  Laterality: N/A;  . ESOPHAGOGASTRODUODENOSCOPY     10/2001, 12/2003, 04/2004  . LUMBAR LAMINECTOMY/DECOMPRESSION MICRODISCECTOMY N/A 05/03/2013   Procedure: LUMBAR LAMINECTOMY/DCOMPRESSION MICRODISCECTOMY RIGHT LUMBAR FOUR FIVE;  Surgeon: [name], MD;  Location: [facility];  Service: Neurosurgery;  Laterality: N/A;  . SHOULDER ARTHROSCOPY Left 9/10  . stones     hx  . TRANSURETHRAL RESECTION OF PROSTATE N/A 11/19/2014   Procedure: TRANSURETHRAL RESECTION OF THE PROSTATE WITH GYRUS INSTRUMENTS;  Surgeon: [name], MD;  Location: [name];  Service: Urology;  Laterality: N/A;  . WISDOM TOOTH EXTRACTION      Current Outpatient Prescriptions  Medication Sig Dispense Refill  . acetaminophen (TYLENOL) 500 MG tablet Take 1,000 mg by mouth every 8 (eight) hours as needed for mild pain or moderate pain.    [name] aspirin EC 81 MG tablet Take 81 mg by mouth. Patient takes 3 times a week    . carvedilol (COREG) 6.25 MG tablet Take 1 tablet (6.25 mg total) by mouth 2 (two) times daily. 180 tablet 3  . citalopram (CELEXA) 20 MG tablet Take 20 mg by mouth daily.    . cloNIDine (CATAPRES) 0.2 MG tablet Take 0.2-0.3 mg by mouth 2 (two) times daily. Takes 1.5 tablet in am and 1.5 tablets in PM daily    . furosemide (LASIX) 20 MG tablet Take 1 tablet (20 mg total) by mouth daily. 90 tablet 3  . hydrALAZINE (APRESOLINE) 25 MG tablet Take 1 tablet (25 mg total) by mouth 3 (three) times daily. 360 tablet 3  . irbesartan (AVAPRO) 300 MG tablet Take 300 mg by mouth every morning.    [name] KLOR-CON 10 10 MEQ tablet  Take 1 tablet (10 mEq total) by mouth daily. 90 tablet 3  . nitroGLYCERIN (NITROSTAT) 0.4 MG SL tablet Place 1 tablet (0.4 mg total) under the tongue every 5 (five) minutes as needed for  chest pain. 25 tablet 12  . Omega-3 Fatty Acids (THE VERY FINEST FISH OIL) LIQD Take 2.5 teaspoons which equals 4,000mg  fish oil    . pantoprazole (PROTONIX) 40 MG tablet Take 40 mg by mouth as directed.     . traMADol (ULTRAM) 50 MG tablet Take 50 mg by mouth daily as needed (pain).      No current facility-administered medications for this visit.     Allergies as of 07/13/2016 - Review Complete 06/01/2016  Allergen Reaction Noted  . Ambien [zolpidem tartrate] Other (See Comments) 11/19/2014  . Aspirin Other (See Comments) 04/27/2013  . Celebrex [celecoxib] Other (See Comments) 04/10/2013  . Nsaids Other (See Comments) 04/10/2013  . Toprol xl [metoprolol tartrate] Swelling 04/10/2013  . Altace [ramipril]  04/10/2013  . Amlodipine  10/25/2014  . Levothyroxine Other (See Comments) 08/11/2013  . Biaxin [clarithromycin] Nausea Only 04/10/2013  . Tetracyclines & related Nausea Only 04/10/2013    Vitals: There were no vitals taken for this visit. Last Weight:  Wt Readings from Last 1 Encounters:  06/01/16 194 lb 12.8 oz (88.4 kg)   Last Height:   Ht Readings from Last 1 Encounters:  06/01/16 5' 8.5" (1.74 m)     Cranial Nerves:  The pupils are equal, round, and reactive to light. Visual fields are full to finger confrontation. Extraocular movements are intact. Trigeminal sensation is intact and the muscles of mastication are normal. The face is symmetric. The palate elevates in the midline. Voice is normal. Shoulder shrug is normal. The tongue has normal motion without fasciculations.   Coordination: Mild bradykinesia noted with finger tapping left greater than right, hand opening closing left greater than right and rapid alternating movement. Bradykinesia with tapping of left foot, right within  normal limits.  Gait:  decreased arm swing on the left, shuffling of steps, no re emergent tremor in the right, Romberg negative, multiple steps back with pull test   Motor Observation:  Minimal rest tremor noted right hand greater than left, bilateral intention and postural tremor Tone:  cogwheeling bilateral wrists  Posture:  Posture is stooped   Strength:  Strength is V/V in the upper and lower limbs.      Assessment/Plan:82 year old gentleman presenting for follow up evaluation of parkinsonism, which is been ongoing for multiple years. Symptoms c/w parkinsonism, likely idiopathic but vascular PD also possible given his extensive changes of chronic microvascular disease and scattered multiple microhemorrhages of remote age on MRI of the brain. Reviewed images with patient and wife again today when discussing vascular parkinsonism vs parkinson's disase. Marland KitchenHe describes both a rest and action component to tremor, he feels action component is causing the most difficulty. MRI brain reviewed showed multiple micro-hemorrhages. Was evaluated by Darryl Leonie Man who suggested aggressive BP control of <130/90. Given his extensive changes of chronic microvascular disease and managing blood pressure and other vascular risk factors is important, advised them again today   - Did not tolerate Sinemet, will try low-dose Rytary .  - PT in home, will reorder due to decline since last PT and falls- Will order [facility] for PT, OT and nursing given his parkinsonism, gait abnormalities, difficulty walking and risk for falls, difficulty with daily activities of living and needs nursing for medication management Suggest swallow study for aspiration, they decline - B12 263 with elevated MMA c/w B12 deficiency advised daily B12 supplementation. Discussed risks of B12 deficiency including dementia.  - Given his significant white-matter changes in the brain; extensive changes of chronic microvascular  disease and scattered multiple microhemorrhages of remote age I wouldn't be surprised if patient had significant memory loss but they deny and - declined any memory testing in the office. Fall recently: will order PT as above  1. B12 daily 1000-2075mcg a day, B12 was 263 and elevated MMA at last appt. 2. Rytary 1 pill every 4 hours tid. Try to separate from food (especially protein-rich foods like meat, dairy, eggs) by about 30-60 mins - this will help the absorption of the medication. If you have some nausea with the medication, you can take it with some light food like crackers or ginger ale 3. Recommend memory testing and consideration for Exelon if needed - declined 4. Recommend Swallow evaluation to assess for aspiration - denied 5. Will order in-home therapy with a nurse for new medication management, physical therapy for recent decline and falls 6. Recommend f/u in 6 months   Sarina Ill, MD  [name] [address] [place], [place] [address]  Phone [phone] Fax [phone]  A total of 40 minutes was spent face-to-face with this patient. Over half this time was spent on counseling patient on the Parkinsonism, gait abnormality, fall risk diagnosis and different diagnostic and therapeutic options available.

## 2016-07-13 NOTE — Patient Instructions (Addendum)
Remember to drink plenty of fluid, eat healthy meals and do not skip any meals. Try to eat protein with a every meal and eat a healthy snack such as fruit or nuts in between meals. Try to keep a regular sleep-wake schedule and try to exercise daily, particularly in the form of walking, 20-30 minutes a day, if you can.   As far as your medications are concerned, I would like to suggest: Rytary three times a day  I would like to see you back in 6 months, sooner if we need to. Please call us with any interim questions, concerns, problems, updates or refill requests.   Our phone number is [phone]. We also have an after hours call service for urgent matters and there is a physician on-call for urgent questions. For any emergencies you know to call 911 or go to the nearest emergency room  Carbidopa; Levodopa extended-release capsules What is this medicine? CARBIDOPA;LEVODOPA (kar bi DOE pa; lee voe DOE pa) is used to treat the symptoms of Parkinson's disease. COMMON BRAND NAME(S): Rytary What should I tell my health care provider before I take this medicine? They need to know if you have any of these conditions: -depression or other mental illness -diabetes -glaucoma -heart disease, including recent heart attack -history of irregular heart beat -kidney disease -liver disease -lung or breathing disease, like asthma -melanoma or suspicious skin lesions -stomach or intestine problems -an unusual or allergic reaction to levodopa, carbidopa, other medicines, foods, dyes, or preservatives -pregnant or trying to get pregnant -breast-feeding How should I use this medicine? Take this medicine by mouth with a glass of water. Follow the directions on the prescription label. Swallow whole. Do not crush, chew, or divide the capsules. If you have trouble swallowing, you may open the capsule and sprinkle the entire contents on 1 to 2 tablespoons of applesauce. Take the medicine/food mixture immediately,  and do not store for future use. Take your doses at regular intervals. Do not take your medicine more often than directed. Do not stop taking except on the advice of your doctor or health care professional. A high fat, high calorie meal may slow the absorption of the medicine into your system and delay the onset of action by 2 to 3 hours. Consider taking the first dose of the day 1 to 2 hours before eating. If you develop nausea, the medicine may be taken with food. Talk to your pediatrician regarding the use of this medicine in children. Special care may be needed. What if I miss a dose? If you miss a dose, take it as soon as you can. If it is almost time for your next dose, take only that dose. Do not take double or extra doses. What may interact with this medicine? Do not take this medicine with any of the following medications: -MAOIs like Marplan, Nardil, and Parnate -reserpine -tetrabenazine This medicine may also interact with the following medications: -alcohol -droperidol -entacapone -iron supplements or multivitamins with iron -isoniazid, INH -linezolid -medicines for depression, anxiety, or psychotic disturbances -medicines for high blood pressure -medicines for sleep -metoclopramide -papaverine -procarbazine -tedizolid -rasagiline -selegiline -tolcapone What should I watch for while using this medicine? Visit your doctor or health care professional for regular checks on your progress. It may be several weeks or months before you feel the full benefits of this medicine. Continue to take your medicine on a regular schedule. Do not take any additional medicines for Parkinson's disease without first consulting with your health care  provider. You may get drowsy or dizzy. Do not drive, use machinery, or do anything that needs mental alertness until you know how this drug affects you. Do not stand or sit up quickly, especially if you are an older patient. This reduces the risk of  dizzy or fainting spells. Alcohol can make you more drowsy and dizzy. Avoid alcoholic drinks. If you find that you have sudden feelings of wanting to sleep during normal activities, like cooking, watching television, or while driving or riding in a car, you should contact your health care professional. Dennis Bast may experience a 'wearing off' effect prior to the time for your next dose of this medicine. You may also experience an 'on-off' effect where the medicine apparently stops working for any time from a minute to several hours, then suddenly starts working again. Tell your doctor or health care professional if any of these symptoms happen to you. Your dose may need adjustment. A high protein meal can slow or prevent absorption of this medicine. Avoid high protein foods near the time of taking this medicine to help prevent these problems. Take this medicine at least 30 minutes before eating or one hour after meals. You may want to eat higher protein foods later in the day or in small amounts. If you have diabetes, you may get a false-positive result for sugar in your urine. Check with your doctor or health care professional. This medicine may discolor the urine or sweat, making it look darker or red in color. This is of no cause for concern. However, this may stain clothing or fabrics. There have been reports of increased sexual urges or other strong urges such as gambling while taking some medicines for Parkinson's disease. If you experience any of these urges while taking this medicine, you should report it to your health care provider as soon as possible. You should check your skin often for changes to moles and new growths while taking this medicine. Call your doctor if you notice any of these changes. What side effects may I notice from receiving this medicine? Side effects that you should report to your doctor or health care professional as soon as possible: -allergic reactions like skin rash, itching  or hives, swelling of the face, lips, or tongue -anxiety, confusion, or nervousness -falling asleep during normal activities like driving -fast, irregular heartbeat -hallucination, loss of contact with reality -mood changes like aggressive behavior, depression -stomach pain -trouble passing urine -uncontrolled movements of the mouth, head, hands, feet, shoulders, eyelids or other unusual muscle movements Side effects that usually do not require medical attention (report to your doctor or health care professional if they continue or are bothersome): -headache -loss of appetite -muscle twitches -nausea/vomiting -nightmares, trouble sleeping Where should I keep my medicine? Keep out of the reach of children. Store at room temperature between 15 and 30 degrees C (59 and 86 degrees F). Protect from light and moisture. Throw away any unused medicine after the expiration date.  2017 [employer] Standard (2015-07-18 11:47:10)

## 2016-07-20 ENCOUNTER — Telehealth: Payer: Self-pay | Admitting: Neurology

## 2016-07-20 NOTE — Telephone Encounter (Signed)
Ok, thank you

## 2016-07-20 NOTE — Telephone Encounter (Signed)
Please evaluate Darryl [name] for admission to [facility].  Disciplines requested: nursing for new parkinson's medication management as well as recent decline in gait with falls  Services to provide: nursing and PT  Patient is declining all services . He stated to [facility] and [facility] both services down. I asked to speak to wife. Patient relayed he was making discission not his wife.  Patient was very nice but he was very clear as well. I relayed to Patient if he ever changed his mind to give the office a call back. Patient understood all details.

## 2016-07-21 ENCOUNTER — Telehealth: Payer: Self-pay | Admitting: Cardiology

## 2016-07-21 NOTE — Telephone Encounter (Signed)
Patient wife called to apologize to me because husband declined Home Health referral. I relayed to Mrs. Risko  Everything was fine.   Patient's wife will back to schedule his 6 month follow up apt with Dr. Jaynee Eagles. 07/22/2016.  Terrence Dupont, patient's wife relayed he is not going to try RYTARY .

## 2016-07-21 NOTE — Telephone Encounter (Signed)
Left message for Judson Roch to send another fax to [phone] attn: Valetta Fuller, RN as fax has not been received. Instructed her to call with any further questions or concerns.

## 2016-07-21 NOTE — Telephone Encounter (Signed)
Patient's wife is calling to discuss conversation that her husband had in previous message about home health.

## 2016-07-21 NOTE — Telephone Encounter (Signed)
New Message  Hailey from [facility] voiced there was a fax sent over about a drug interaction.  Hailey voiced it was faxed on 1.19.18 and waiting for the fax.  Please f/u

## 2016-07-21 NOTE — Telephone Encounter (Signed)
Darryl [name]- can you call wife back about home health referral? Thank you!

## 2016-07-27 ENCOUNTER — Telehealth: Payer: Self-pay | Admitting: Cardiology

## 2016-07-27 NOTE — Telephone Encounter (Signed)
informed pt RX was sent in for 90 days, actually more then 90 days. pt aware.

## 2016-07-27 NOTE — Telephone Encounter (Signed)
New message        *STAT* If patient is at the pharmacy, call can be transferred to refill team.   1. Which medications need to be refilled? (please list name of each medication and dose if known) hydralazine 25mg  2. Which pharmacy/location (including street and city if local pharmacy) is medication to be sent to? [facility]  [phone] 3. Do they need a 30 day or 90 day supply? 90 day supply----presc was not for 90 days Pt said pharmacy called them to tell them to call doctor and request a 90 day supply

## 2016-07-30 ENCOUNTER — Other Ambulatory Visit: Payer: Self-pay | Admitting: *Deleted

## 2016-07-30 MED ORDER — HYDRALAZINE HCL 25 MG PO TABS: 25.0000 mg | ORAL_TABLET | Freq: Three times a day (TID) | ORAL | 3 refills | Status: DC

## 2016-07-30 NOTE — Telephone Encounter (Signed)
Patients wife called and stated that [facility] received the rx for hydralazine but the quantity was missing. She stated that she had just got off the phone with [facility] and they requested that the rx be resent. Wife aware that I will resend.

## 2016-08-17 DIAGNOSIS — N402 Nodular prostate without lower urinary tract symptoms: Secondary | ICD-10-CM | POA: Diagnosis not present

## 2016-08-20 DIAGNOSIS — M5416 Radiculopathy, lumbar region: Secondary | ICD-10-CM | POA: Diagnosis not present

## 2016-09-10 DIAGNOSIS — N189 Chronic kidney disease, unspecified: Secondary | ICD-10-CM | POA: Diagnosis not present

## 2016-09-10 DIAGNOSIS — G20 Parkinson's disease: Secondary | ICD-10-CM | POA: Diagnosis not present

## 2016-09-10 DIAGNOSIS — K2950 Unspecified chronic gastritis without bleeding: Secondary | ICD-10-CM | POA: Diagnosis not present

## 2016-09-10 DIAGNOSIS — R946 Abnormal results of thyroid function studies: Secondary | ICD-10-CM | POA: Diagnosis not present

## 2016-09-10 DIAGNOSIS — I10 Essential (primary) hypertension: Secondary | ICD-10-CM | POA: Diagnosis not present

## 2016-09-10 DIAGNOSIS — Z0000 Encounter for general adult medical examination without abnormal findings: Secondary | ICD-10-CM | POA: Diagnosis not present

## 2016-09-10 DIAGNOSIS — E663 Overweight: Secondary | ICD-10-CM | POA: Diagnosis not present

## 2016-09-10 DIAGNOSIS — I2510 Atherosclerotic heart disease of native coronary artery without angina pectoris: Secondary | ICD-10-CM | POA: Diagnosis not present

## 2016-09-10 DIAGNOSIS — M4727 Other spondylosis with radiculopathy, lumbosacral region: Secondary | ICD-10-CM | POA: Diagnosis not present

## 2016-09-10 DIAGNOSIS — F3342 Major depressive disorder, recurrent, in full remission: Secondary | ICD-10-CM | POA: Diagnosis not present

## 2016-09-10 DIAGNOSIS — F411 Generalized anxiety disorder: Secondary | ICD-10-CM | POA: Diagnosis not present

## 2016-09-10 DIAGNOSIS — I77810 Thoracic aortic ectasia: Secondary | ICD-10-CM | POA: Diagnosis not present

## 2016-09-14 DIAGNOSIS — I10 Essential (primary) hypertension: Secondary | ICD-10-CM | POA: Diagnosis not present

## 2016-09-14 DIAGNOSIS — N402 Nodular prostate without lower urinary tract symptoms: Secondary | ICD-10-CM | POA: Diagnosis not present

## 2016-09-14 DIAGNOSIS — M4727 Other spondylosis with radiculopathy, lumbosacral region: Secondary | ICD-10-CM | POA: Diagnosis not present

## 2016-09-14 DIAGNOSIS — E782 Mixed hyperlipidemia: Secondary | ICD-10-CM | POA: Diagnosis not present

## 2016-09-14 DIAGNOSIS — N189 Chronic kidney disease, unspecified: Secondary | ICD-10-CM | POA: Diagnosis not present

## 2016-09-14 DIAGNOSIS — F3342 Major depressive disorder, recurrent, in full remission: Secondary | ICD-10-CM | POA: Diagnosis not present

## 2016-09-14 DIAGNOSIS — I2510 Atherosclerotic heart disease of native coronary artery without angina pectoris: Secondary | ICD-10-CM | POA: Diagnosis not present

## 2016-09-16 ENCOUNTER — Telehealth: Payer: Self-pay | Admitting: Neurology

## 2016-09-16 NOTE — Telephone Encounter (Signed)
Pt's wife called says he is having some symptoms she would like to discuss, she is wondering it they could be related to his Parkinson's. Pls call

## 2016-09-16 NOTE — Telephone Encounter (Signed)
Spoke to pt's wife and she agreed to contact PCP about sooner visit and recommendations about gabapentin. Sooner follow-up for Parkinson's scheduled in April w/ Dr. Jaynee Eagles.

## 2016-09-16 NOTE — Telephone Encounter (Signed)
Pt was last seen in Jan for Parkinsonism, fall and B12 deficiency. He did not tolerate Sinemet so was started on Rytary. Pt refuses to use walker, was non-compliant w/ taking B12 supplement, declined home health, swallowing study and memory screening.  Returned call and spoke to pt's wife. Reports that pt was having some back and leg pain and gabapentin was prescribed by PCP. However, his leg gave out on him on Sunday so he has since stopped the gabapentin. He also has been having a "twitch" in his L shoulder and has been c/o HA and a "crick in his neck" over the past 2 weeks. He has a follow-up appt scheduled w/ Dr. Jaynee Eagles in May but wife would like her opinion on whether he should re-start gabapentin and/or be seen sooner d/t current symptoms.

## 2016-09-16 NOTE — Telephone Encounter (Signed)
He should be seen by his primary care sooner and they can see me in May for his parkinson's. thanks

## 2016-10-12 ENCOUNTER — Encounter: Payer: Self-pay | Admitting: Neurology

## 2016-10-19 ENCOUNTER — Ambulatory Visit (INDEPENDENT_AMBULATORY_CARE_PROVIDER_SITE_OTHER): Payer: Medicare Other | Admitting: Neurology

## 2016-10-19 ENCOUNTER — Encounter: Payer: Self-pay | Admitting: Neurology

## 2016-10-19 VITALS — BP 117/77 | HR 67 | Ht 68.5 in | Wt 187.2 lb

## 2016-10-19 DIAGNOSIS — G20 Parkinson's disease: Secondary | ICD-10-CM

## 2016-10-19 NOTE — Progress Notes (Signed)
[id number]     Provider:  Dr Jaynee Eagles Referring Provider: [name], MD Primary Care Physician:  Tawanna Solo, MD  CC: Parkinsonism   Interval history 10/19/2016: Patient  Interval history 10/19/2016: In March patient had a near fall, almost went down, both legs collapsed in the setting of starting Gabapentin. Since then medications have been decreased and doing better. No falls since then. Still having pain in the back and right leg and sees Dr. Brien Few for this. Discussed repeat Home PT and he declined again. Walking is slowed. In November he was going down an incline and couldn't stop. He refuses to use a cane or a walker. Sleeping is fine. Memory is fine. No dizziness when he stands up. No weakness. He takes b12 supplementation.   Interval history 07/13/2016: Patient is here for parkinsonism, idiopathic parkinson's disease vs vascular parkinsonism. Started him on Sinemet but he did not tolerate. His tremors are improved. He refuses to use his walker, I encouraged him to use it especially since he recently fell. PT in their home was "awesome". He has not been taking his B12, I discussed again he has B12 deficiency and he should be taking B12. Discussed Parkinson;s disease vs parkinsonism wife wife, answered all questions. He is falling more, will request home PT again for decline.  Interval history 03/13/2016: He has chronic low back pain, stiffness in the low back, and shoots down the right leg. Constant aching in the right leg. Had about 20 shots in the low back. His legs are getting weaker, shuffling. He goes to [facility]. They deny any memory changes, they declined an MMSE, advised if he is having memory loss we could start medication that could slow down memory loss. Tremor is stable, mostly on the right, none in the left. Volume of voice is getting lower. He has some choking with food. I recommended a swallow eval for aspiration but they  declined. Denies REM sleep disorder. No snoring, no witnessed apneic events. No drooling or wet pillows or increased saliva. He is shuffling, he fell in July after walking in the yard and he couldn't stop. He is supposed to use a walker but they don;t use it, will request [facility] for PT. No loss of taste or smell. No incontinence. Handwriting is sloppy, looks like a 37-year old. When he stands up he has to stand for a few minutes. No freezing.   He has significant white-matter changes in the brain; extensive changes of chronic microvascular disease and scattered multiple microhemorrhages of remote age. Reviewed images with patient and wife again today.   KDT:OIZTIWP S Teacheyis a 82 y.o.malehere as a referral from Dr. Alcide Clever Parkinson's disease  He has been having urinary retention. He has a foley. He developed urinary tract infection and sepsis. Been trying to take the foley out, but he cannot urinate. He has an appointment for urodynamics and being treated for enlarged prostate. Urologist wanted him evaluated for urinary retention in relation to PD. He has been having urinary difficulty since 2005. This has happened in the past in 2005 after surgery, then in 2010 after syrgery where he had to wear a foley until he could urinate again. Then in November of 2014 after back surgery. This time his urinary retention happened after epidural steroid injections for LBP. Patient has been followed in clinic for PD but has declined medication in the past as he feels his symptoms are not severe enough and manageable without medications.  Interval Update 06/12/2014: Tremor is stable. Sometimes you can see it and other times it is not there. No problems eating, dressing. Right is worse so needs to use left hand. Right >>left. Handwriting is getting worse. Walking is getting a little slower. Has been working with the cardiologist for tight blood pressure control. No muscle stiffness. No falls. Not using a  walking aid. No REM sleep disorder. Sense of smell is fine. Has constipation since starting the celexa for anxiety. No orthostatic dizzines.   12/12/2013 Dr. Janann Colonel: JAHMEEK SHIRK is a 82 y.o. male here as a follow up from Dr. Rex Kras for tremor evaluation. Last visit was 08/2013 at which time there was concern over possible amyloid angiopathy. He was evaluated by Dr Leonie Man with findings likely related to chronic HTN/small vessel disease. His ASA 81mg  was changed to every other day due to bruising. Overall doing well. Continues to have difficulties with his blood pressure. Currently working with Dr [name] (cardiology).   The tremor is stable, no worsening, remains stable. He currently reports some pain radiating from lower back down the posterior surface of his right leg. He has a history of lumbar disc herniation s/p surgery.    Initial visit 07/2013 Dr. Janann Colonel: First noticed tremor around 3 years ago, started on right but now involves both hands but is R>L. Described as both a rest and action/postural tremor. Bothers him the most when trying to eat. Handwriting has gotten messier, no change in size. Wife notes some generalized slowing down, some shuffling of his feet. No muscle stiffness or cramps. He does not some gait instability, has some trouble getting in and out of the car. Feels very unsteady going down stairs. Has not noticed anything that makes the tremor worse. No intake of caffeine or EtOH. Sleeps well, wife notes some REM behavior disorder (talking in his sleep). No known difficulty with sense of smell. No known stroke or TIA, has had CAD with CABG x 4.   He thinks his father may have had a tremor.   Reviewed notes, labs and imaging from outside physicians, which showed: MRI of the brain 07/2013: Abnormal MRI scan of the brain showing extensive changes of chronic microvascular disease and scattered multiple microhemorrhages of remote age. Possibilities include amyloid angiopathy  versus small vessel disease.   Review of Systems: Patient complains of symptoms per HPI as well as the following symptoms: Jevity change, difficulty urinating, headache, tremors, passing out, ringing in ears, joint pain, back pain, walking difficulty, restless legs, daytime sleepiness. Pertinent negatives per HPI. All others negative.  Social History   Social History  . Marital status: Married    Spouse name: [name]  . Number of children: 4  . Years of education: College   Occupational History  . Not on file.   Social History Main Topics  . Smoking status: Former Smoker    Packs/day: 2.00    Years: 5.00    Types: Cigarettes    Quit date: 04/28/1959  . Smokeless tobacco: Never Used     Comment: 55+ yrs ago  . Alcohol use No  . Drug use: No  . Sexual activity: Not on file   Other Topics Concern  . Not on file   Social History Narrative   Patient is married to [name]), has 4 children   Patient is right handed   Education level is college   Caffeine consumption is none    Family History  Problem Relation Age of Onset  .  CAD Father 58    ASCAD  . CAD Sister     Past Medical History:  Diagnosis Date  . Arthritis   . BPH (benign prostatic hypertrophy) with urinary retention   . Chronic diastolic CHF (congestive heart failure) ([place])   . Colon polyp   . Coronary atherosclerosis of native coronary artery    a. s/p CABG 11/2003. b. s/p DES to radial-OM and SVG-RCA 11/05. c. s/p BMS to LCx 11/2008.  . Dilated aortic root ([place]) 05/29/2014  . Diverticulosis   . Dyslipidemia    statin intolerant  . Elevated fasting glucose   . Gastritis   . Gastritis    a. by EGD 04/2004.  . H/O urinary retention    post surgical procedures; has foley cath  . Headache(784.0)   . Hiatal hernia   . HOH (hard of hearing)    slight hoh, rt>l  . HTN (hypertension)   . Hypertensive heart disease   . Insomnia   . Intracranial hemorrhage ([place])    a. small pontine [place] 2003.  [name] LBBB  (left bundle branch block)   . Lumbar spondylolysis    multilevel DJD with mild spinal stenosis L3-L5  . Migraines   . Parkinson's disease ([place])   . Peptic ulcer disease    a. prior hx of bleeding ulcers, including 12/2003 in setting of aspirin use.  . Renal insufficiency    renal duplex scan in 2012 showed no renal artery stenosis  . Sleep disturbance   . Stroke [facility])    a. remote hx of stroke reportedly r/t HTN.  [name] Thrombocytopenia ([place])   . Tremor   . UTI (urinary tract infection)   . Wears glasses     Past Surgical History:  Procedure Laterality Date  . APPENDECTOMY  62   1963  . CHOLECYSTECTOMY  1998  . COLON SURGERY  03/14/2008  . COLONOSCOPY  10/2001  . CORONARY ANGIOPLASTY  6/10,10/05   Dr. [name] cardiologist: last office visit 11/07/14  . CORONARY ARTERY BYPASS GRAFT  2005  . CYSTOSCOPY WITH LITHOLAPAXY N/A 11/19/2014   Procedure: CYSTOSCOPY WITH LITHOLAPAXY;  Surgeon: [name], MD;  Location: [facility];  Service: Urology;  Laterality: N/A;  . ESOPHAGOGASTRODUODENOSCOPY     10/2001, 12/2003, 04/2004  . LUMBAR LAMINECTOMY/DECOMPRESSION MICRODISCECTOMY N/A 05/03/2013   Procedure: LUMBAR LAMINECTOMY/DCOMPRESSION MICRODISCECTOMY RIGHT LUMBAR FOUR FIVE;  Surgeon: [name], MD;  Location: [place] [facility];  Service: Neurosurgery;  Laterality: N/A;  . SHOULDER ARTHROSCOPY Left 9/10  . stones     hx  . TRANSURETHRAL RESECTION OF PROSTATE N/A 11/19/2014   Procedure: TRANSURETHRAL RESECTION OF THE PROSTATE WITH GYRUS INSTRUMENTS;  Surgeon: [name], MD;  Location: [facility];  Service: Urology;  Laterality: N/A;  . WISDOM TOOTH EXTRACTION      Current Outpatient Prescriptions  Medication Sig Dispense Refill  . acetaminophen (TYLENOL) 500 MG tablet Take 1,000 mg by mouth every 8 (eight) hours as needed for mild pain or moderate pain.    [name] aspirin EC 81 MG tablet Take 81 mg by mouth. Patient takes 3 times a week    .  Carbidopa-Levodopa ER (RYTARY) 23.75-95 MG CPCR Take 1 tablet by mouth 3 (three) times daily. 42 capsule 0  . carvedilol (COREG) 6.25 MG tablet Take 1 tablet (6.25 mg total) by mouth 2 (two) times daily. 180 tablet 3  . citalopram (CELEXA) 20 MG tablet Take 20 mg by mouth daily.    . cloNIDine (  CATAPRES) 0.2 MG tablet Take 0.2-0.3 mg by mouth 2 (two) times daily. Takes 1.5 tablet in am and 1.5 tablets in PM daily    . furosemide (LASIX) 20 MG tablet Take 1 tablet (20 mg total) by mouth daily. 90 tablet 3  . hydrALAZINE (APRESOLINE) 25 MG tablet Take 1 tablet (25 mg total) by mouth 3 (three) times daily. 270 tablet 3  . irbesartan (AVAPRO) 300 MG tablet Take 300 mg by mouth every morning.    [name] KLOR-CON 10 10 MEQ tablet Take 1 tablet (10 mEq total) by mouth daily. 90 tablet 3  . nitroGLYCERIN (NITROSTAT) 0.4 MG SL tablet Place 1 tablet (0.4 mg total) under the tongue every 5 (five) minutes as needed for chest pain. 25 tablet 12  . Omega-3 Fatty Acids (THE VERY FINEST FISH OIL) LIQD Take 2.5 teaspoons which equals 4,000mg  fish oil    . pantoprazole (PROTONIX) 40 MG tablet Take 40 mg by mouth as directed.     . traMADol (ULTRAM) 50 MG tablet Take 50 mg by mouth daily as needed (pain).      No current facility-administered medications for this visit.     Allergies as of 10/19/2016 - Review Complete 07/13/2016  Allergen Reaction Noted  . Ambien [zolpidem tartrate] Other (See Comments) 11/19/2014  . Aspirin Other (See Comments) 04/27/2013  . Celebrex [celecoxib] Other (See Comments) 04/10/2013  . Nsaids Other (See Comments) 04/10/2013  . Toprol xl [metoprolol tartrate] Swelling 04/10/2013  . Altace [ramipril]  04/10/2013  . Amlodipine  10/25/2014  . Levothyroxine Other (See Comments) 08/11/2013  . Biaxin [clarithromycin] Nausea Only 04/10/2013  . Tetracyclines & related Nausea Only 04/10/2013    Vitals: There were no vitals taken for this visit. Last Weight:  Wt Readings from Last 1  Encounters:  07/13/16 196 lb 3.2 oz (89 kg)   Last Height:   Ht Readings from Last 1 Encounters:  07/13/16 5' 8.5" (1.74 m)    Cranial Nerves:  The pupils are equal, round, and reactive to light. Visual fields are full to finger confrontation. Extraocular movements are intact. Trigeminal sensation is intact and the muscles of mastication are normal. The face is symmetric. The palate elevates in the midline. Voice is normal. Shoulder shrug is normal. The tongue has normal motion without fasciculations.   Coordination: Mild bradykinesia noted with finger tapping left greater than right, hand opening closing left greater than right and rapid alternating movement. Bradykinesia with tapping of left foot, right within normal limits.  Gait:  decreased arm swing on the left, shuffling of steps, no re emergent tremor in the right, Romberg negative, multiple steps back with pull test   Motor Observation:  Minimal rest tremor noted right hand greater than left, bilateral intention and postural tremor Tone:  cogwheeling bilateral wrists  Posture:  Posture is stooped   Strength:  Strength is V/V in the upper and lower limbs.      Assessment/Plan:82 year old gentleman presenting for follow up evaluation of parkinsonism, which is been ongoing for multiple years. Symptoms c/w parkinsonism, likely idiopathic but vascular PD also possible given his extensive changes of chronic microvascular disease and scattered multiple microhemorrhages of remote age on MRI of the brain. Reviewed images with patient and wife in the past when discussing vascular parkinsonism vs parkinson's disase. Marland KitchenHe describes both a rest and action component to tremor, he feels action component is causing the most difficulty. MRI brain reviewed showed multiple micro-hemorrhages. Was evaluated by Dr Leonie Man who suggested aggressive BP control  of <130/90. Given his extensive changes of chronic microvascular  disease and managing blood pressure and other vascular risk factors is important, advised them again today  - Patient is resistant to physical therapy, trying any more medications, discussion about memory loss, swallow evaluation. At this point is not much that I can do for patient. I will follow up with him in 1 year. - Did not tolerate Sinemet or Rytary .  - PT in home:denied - Suggest swallow study for aspiration, they decline - B12 263 with elevated MMA c/w B12 deficiency advised daily B12 supplementation. Discussed risks of B12 deficiency including dementia. Patient states he is taking a supplement. All up with primary care. - Given his significant white-matter changes in the brain; extensive changes of chronic microvascular disease and scattered multiple microhemorrhages of remote age I wouldn't be surprised if patient had significant memory loss but they deny and - declined anymemory testing in the office.    Assessment/Plan:    Sarina Ill, MD  [facility] [address] [place], [place] [address]  Phone [phone] Fax [phone]  A total of 15 minutes was spent face-to-face with this patient. Over half this time was spent on counseling patient on the Parkinsonism diagnosis and different diagnostic and therapeutic options available.

## 2016-11-11 DIAGNOSIS — M4727 Other spondylosis with radiculopathy, lumbosacral region: Secondary | ICD-10-CM | POA: Diagnosis not present

## 2016-11-26 ENCOUNTER — Ambulatory Visit: Payer: Medicare Other | Admitting: Neurology

## 2016-11-30 ENCOUNTER — Ambulatory Visit (INDEPENDENT_AMBULATORY_CARE_PROVIDER_SITE_OTHER): Payer: Medicare Other | Admitting: Podiatry

## 2016-11-30 ENCOUNTER — Encounter: Payer: Self-pay | Admitting: Podiatry

## 2016-11-30 DIAGNOSIS — L603 Nail dystrophy: Secondary | ICD-10-CM | POA: Diagnosis not present

## 2016-11-30 DIAGNOSIS — B351 Tinea unguium: Secondary | ICD-10-CM

## 2016-11-30 DIAGNOSIS — I2510 Atherosclerotic heart disease of native coronary artery without angina pectoris: Secondary | ICD-10-CM

## 2016-11-30 NOTE — Progress Notes (Signed)
   HPI: patient is a 82 year old male presenting today with a complaint trauma to the left great toenail that occurred 1 month ago. He states he stumped the toe causing the nail to grow back thin. He reports intermittent pain. He has been applying triamcinolone cream to the area with no significant relief. He is here for further evaluation and treatment.   Physical Exam: General: The patient is alert and oriented x3 in no acute distress.  Dermatology: Partially detached great toenails at lateral borders. Skin is warm, dry and supple bilateral lower extremities. Negative for open lesions or macerations.  Vascular: Palpable pedal pulses bilaterally. No edema or erythema noted. Capillary refill within normal limits.  Neurological: Epicritic and protective threshold grossly intact bilaterally.   Musculoskeletal Exam: Range of motion within normal limits to all pedal and ankle joints bilateral. Muscle strength 5/5 in all groups bilateral.   Assessment: 1. Bilateral great toenail trauma   Plan of Care:  1. Patient was evaluated. 2. Mechanical debridement of bilateral great toenails using a nail nipper. Filed with dremel without incident.\ 3. Return to clinic when necessary.   Edrick Kins, DPM [facility]  Dr. Edrick Kins, [place] [place]                                        [place], [place] [address]                Office [phone]  Fax [phone]

## 2016-12-11 ENCOUNTER — Ambulatory Visit: Payer: Medicare Other | Admitting: Podiatry

## 2016-12-18 ENCOUNTER — Ambulatory Visit (INDEPENDENT_AMBULATORY_CARE_PROVIDER_SITE_OTHER): Payer: Medicare Other | Admitting: Cardiology

## 2016-12-18 ENCOUNTER — Encounter: Payer: Self-pay | Admitting: Cardiology

## 2016-12-18 VITALS — BP 120/80 | HR 71 | Ht 68.5 in | Wt 187.4 lb

## 2016-12-18 DIAGNOSIS — E785 Hyperlipidemia, unspecified: Secondary | ICD-10-CM | POA: Diagnosis not present

## 2016-12-18 DIAGNOSIS — I10 Essential (primary) hypertension: Secondary | ICD-10-CM

## 2016-12-18 DIAGNOSIS — I951 Orthostatic hypotension: Secondary | ICD-10-CM | POA: Diagnosis not present

## 2016-12-18 DIAGNOSIS — I2510 Atherosclerotic heart disease of native coronary artery without angina pectoris: Secondary | ICD-10-CM | POA: Diagnosis not present

## 2016-12-18 DIAGNOSIS — I5032 Chronic diastolic (congestive) heart failure: Secondary | ICD-10-CM | POA: Diagnosis not present

## 2016-12-18 NOTE — Progress Notes (Addendum)
Cardiology Office Note    Date:  12/18/2016   ID:  Darryl [name], DOB 1934-08-16, MRN [id number]  PCP:  [name], MD  Cardiologist:  [name], MD   Chief Complaint  Patient presents with  . Coronary Artery Disease  . Hypertension  . Hyperlipidemia    History of Present Illness:  Darryl [name] is a 82 y.o. male  with a history of CAD s/p remote CABG 11/2003 and DES to radial-OM and SVG to RCA 04/2004, BMS to LCx in 2010. He also has a history of HTN, chronic LBBB, dyslipidemia and chronic diastolic CHF.  He presents today for followup and is doing well. He denies any chest pain or pressure, SOB, DOE, PND, orthopnea, LE edema, dizziness (except for standing), palpitations or syncope.  His exercise is limited by his Parkinson's but tries to walk with a cane.  Past Medical History:  Diagnosis Date  . Arthritis   . BPH (benign prostatic hypertrophy) with urinary retention   . Chronic diastolic CHF (congestive heart failure) ([place])   . Colon polyp   . Coronary atherosclerosis of native coronary artery    a. s/p CABG 11/2003. b. s/p DES to radial-OM and SVG-RCA 11/05. c. s/p BMS to LCx 11/2008.  . Dilated aortic root ([place]) 05/29/2014  . Diverticulosis   . Dyslipidemia    statin intolerant  . Elevated fasting glucose   . Gastritis   . Gastritis    a. by EGD 04/2004.  . H/O urinary retention    post surgical procedures; has foley cath  . Headache(784.0)   . Hiatal hernia   . HOH (hard of hearing)    slight hoh, rt>l  . HTN (hypertension)   . Hypertensive heart disease   . Insomnia   . Intracranial hemorrhage ([place])    a. small pontine [place] 2003.  [name] LBBB (left bundle branch block)   . Lumbar spondylolysis    multilevel DJD with mild spinal stenosis L3-L5  . Migraines   . Parkinson's disease ([place])   . Peptic ulcer disease    a. prior hx of bleeding ulcers, including 12/2003 in setting of aspirin use.  . Renal insufficiency    renal duplex scan in 2012 showed no  renal artery stenosis  . Sleep disturbance   . Stroke [facility])    a. remote hx of stroke reportedly r/t HTN.  [name] Thrombocytopenia ([place])   . Tremor   . UTI (urinary tract infection)   . Wears glasses     Past Surgical History:  Procedure Laterality Date  . APPENDECTOMY  62   1963  . CHOLECYSTECTOMY  1998  . COLON SURGERY  03/14/2008  . COLONOSCOPY  10/2001  . CORONARY ANGIOPLASTY  6/10,10/05   Dr. [name] cardiologist: last office visit 11/07/14  . CORONARY ARTERY BYPASS GRAFT  2005  . CYSTOSCOPY WITH LITHOLAPAXY N/A 11/19/2014   Procedure: CYSTOSCOPY WITH LITHOLAPAXY;  Surgeon: [name], MD;  Location: [facility];  Service: Urology;  Laterality: N/A;  . ESOPHAGOGASTRODUODENOSCOPY     10/2001, 12/2003, 04/2004  . LUMBAR LAMINECTOMY/DECOMPRESSION MICRODISCECTOMY N/A 05/03/2013   Procedure: LUMBAR LAMINECTOMY/DCOMPRESSION MICRODISCECTOMY RIGHT LUMBAR FOUR FIVE;  Surgeon: [name], MD;  Location: [facility];  Service: Neurosurgery;  Laterality: N/A;  . SHOULDER ARTHROSCOPY Left 9/10  . stones     hx  . TRANSURETHRAL RESECTION OF PROSTATE N/A 11/19/2014   Procedure: TRANSURETHRAL RESECTION OF THE PROSTATE WITH GYRUS INSTRUMENTS;  Surgeon: [name], MD;  Location: [facility];  Service: Urology;  Laterality: N/A;  . WISDOM TOOTH EXTRACTION      Current Medications: Current Meds  Medication Sig  . acetaminophen (TYLENOL) 500 MG tablet Take 1,000 mg by mouth every 8 (eight) hours as needed for mild pain or moderate pain.  [name] aspirin EC 81 MG tablet Take 81 mg by mouth. Patient takes 3 times a week  . Carbidopa-Levodopa ER (RYTARY) 23.75-95 MG CPCR Take 1 tablet by mouth 3 (three) times daily.  . carvedilol (COREG) 6.25 MG tablet Take 1 tablet (6.25 mg total) by mouth 2 (two) times daily.  . citalopram (CELEXA) 20 MG tablet Take 20 mg by mouth daily.  . cloNIDine (CATAPRES) 0.2 MG tablet Take 0.2-0.3 mg by mouth 2 (two) times daily. Takes 1.5  tablet in am and 1.5 tablets in PM daily  . furosemide (LASIX) 20 MG tablet Take 1 tablet (20 mg total) by mouth daily.  [name] gabapentin (NEURONTIN) 100 MG capsule Take 100 mg by mouth at bedtime.   . hydrALAZINE (APRESOLINE) 25 MG tablet Take 1 tablet (25 mg total) by mouth 3 (three) times daily.  . irbesartan (AVAPRO) 300 MG tablet Take 300 mg by mouth every morning.  [name] KLOR-CON 10 10 MEQ tablet Take 1 tablet (10 mEq total) by mouth daily.  . nitroGLYCERIN (NITROSTAT) 0.4 MG SL tablet Place 1 tablet (0.4 mg total) under the tongue every 5 (five) minutes as needed for chest pain.  . Omega-3 1000 MG CAPS Take by mouth 2 (two) times daily.  . pantoprazole (PROTONIX) 40 MG tablet Take 40 mg by mouth as directed.   . traMADol (ULTRAM) 50 MG tablet Take 50 mg by mouth daily as needed (pain).   [name] VITAMIN E PO Take 500 mg by mouth daily.    Allergies:   Ambien [zolpidem tartrate]; Aspirin; Celebrex [celecoxib]; Nsaids; Toprol xl [metoprolol tartrate]; Altace [ramipril]; Amlodipine; Levothyroxine; Biaxin [clarithromycin]; and Tetracyclines & related   Social History   Social History  . Marital status: Married    Spouse name: Darryl [name]  . Number of children: 4  . Years of education: College   Social History Main Topics  . Smoking status: Former Smoker    Packs/day: 2.00    Years: 5.00    Types: Cigarettes    Quit date: 04/28/1959  . Smokeless tobacco: Never Used     Comment: 55+ yrs ago  . Alcohol use No  . Drug use: No  . Sexual activity: Not Asked   Other Topics Concern  . None   Social History Narrative   Patient is married to Darryl [name]), has 4 children   Patient is right handed   Education level is college   Caffeine consumption is none     Family History:  The patient's family history includes CAD in his sister; CAD (age of onset: 29) in his father.   ROS:   Please see the history of present illness.    Review of Systems  Musculoskeletal: Positive for back pain.  Neurological:  Positive for loss of balance.   All other systems reviewed and are negative.  No flowsheet data found.     PHYSICAL EXAM:   VS:  BP 120/80   Pulse 71   Ht 5' 8.5" (1.74 m)   Wt 187 lb 6.4 oz (85 kg)   SpO2 94%   BMI 28.08 kg/m    GEN: Well nourished, well developed, in no acute distress  HEENT: normal  Neck: no  JVD, carotid bruits, or masses Cardiac: RRR; no, rubs, or gallops,no edema.  Intact distal pulses bilaterally.  2/6 SM at LLSB to apex Respiratory:  clear to auscultation bilaterally, normal work of breathing GI: soft, nontender, nondistended, + BS MS: no deformity or atrophy  Skin: warm and dry, no rash Neuro:  Alert and Oriented x 3, Strength and sensation are intact Psych: euthymic mood, full affect  Wt Readings from Last 3 Encounters:  12/18/16 187 lb 6.4 oz (85 kg)  10/19/16 187 lb 3.2 oz (84.9 kg)  07/13/16 196 lb 3.2 oz (89 kg)      Studies/Labs Reviewed:   EKG:  EKG is not ordered today.   Recent Labs: 03/12/2016: TSH 4.580 06/04/2016: ALT 13; BUN 21; Creat 1.41; Potassium 4.2; Sodium 142   Lipid Panel    Component Value Date/Time   CHOL 150 06/04/2016 0802   TRIG 345 (H) 06/04/2016 0802   HDL 31 (L) 06/04/2016 0802   CHOLHDL 4.8 06/04/2016 0802   VLDL 69 (H) 06/04/2016 0802   LDLCALC 50 06/04/2016 0802   LDLDIRECT 42.0 12/06/2014 0757    Additional studies/ records that were reviewed today include:  none    ASSESSMENT:    1. Coronary artery disease involving native coronary artery of native heart without angina pectoris   2. Chronic diastolic heart failure ([place])   3. Benign essential HTN   4. Orthostatic hypotension   5. Dyslipidemia      PLAN:  In order of problems listed above:  1. ASCAD s/p CABG 11/2003 and subsequent  DES to radial-OM and SVG-RCA 11/05 and s/p BMS to LCx 11/2008.  He has not had any angina.  He will continue on ASA 81mg  daily and  Carvedilol 6.25mg  BID.  He is statin intolerant.  2. Chronic diastolic CHF - he  appears euvolemic on exam today and weight is stable.  He will continue Carvedilol.  He has not needed diuretics which we have tried to avoid with his orthostatics  3. Dilated aortic root - 28mm by echo 04/2016.  Will repeat 04/2017.  4. HTN - BP controlled on current meds.  Continue carvedilol, Clonidine 0.2mg  1.5 tab BID,  Hydralazine 25mg  TID and Avapro 300mg  daily  5. Orthostatic hypotension secondary to underlying Parkinsons disease -this is stable  6. Dyslipidemia with LDL goal < 70.  He is statin intolerant. His last LDL was 50 in Dec 2017.  I will repeat an FLP and ALT.   Medication Adjustments/Labs and Tests Ordered: Current medicines are reviewed at length with the patient today.  Concerns regarding medicines are outlined above.  Medication changes, Labs and Tests ordered today are listed in the Patient Instructions below.  Patient Instructions  Medication Instructions:  Your physician recommends that you continue on your current medications as directed. Please refer to the Current Medication list given to you today.   Labwork: I will get your lab work from your Primary Care Physician.   Testing/Procedures: none  Follow-Up: Your physician wants you to follow-up in: 6 months with Dr. Radford Pax. You will receive a reminder letter in the mail two months in advance. If you don't receive a letter, please call our office to schedule the follow-up appointment.   Any Other Special Instructions Will Be Listed Below (If Applicable).     If you need a refill on your cardiac medications before your next appointment, please call your pharmacy.      Signed, [name], MD  12/18/2016 11:23 AM  [facility] [address], [place], [place]  [address] Phone: [phone]; Fax: [phone]

## 2016-12-18 NOTE — Patient Instructions (Signed)
Medication Instructions:  Your physician recommends that you continue on your current medications as directed. Please refer to the Current Medication list given to you today.   Labwork: I will get your lab work from your Primary Care Physician.   Testing/Procedures: none  Follow-Up: Your physician wants you to follow-up in: 6 months with Dr. Radford Pax. You will receive a reminder letter in the mail two months in advance. If you don't receive a letter, please call our office to schedule the follow-up appointment.   Any Other Special Instructions Will Be Listed Below (If Applicable).     If you need a refill on your cardiac medications before your next appointment, please call your pharmacy.

## 2016-12-25 ENCOUNTER — Telehealth: Payer: Self-pay | Admitting: Cardiology

## 2016-12-25 DIAGNOSIS — E785 Hyperlipidemia, unspecified: Secondary | ICD-10-CM

## 2016-12-25 NOTE — Telephone Encounter (Signed)
Left message to call back  

## 2016-12-25 NOTE — Telephone Encounter (Signed)
Notes recorded by Sueanne Margarita, MD on 12/24/2016 at 3:19 PM EDT Triglycerides were high on last blood work with PCP - please find out if he was fasting    Returned call to wife (ok per DPR)-states  Patient was fasting at time of labs drawn-advised would make Dr. Radford Pax aware and will call back with further recommendations.

## 2016-12-25 NOTE — Telephone Encounter (Signed)
Refer to lipid clinic 

## 2016-12-25 NOTE — Telephone Encounter (Signed)
New message    Pt wife is calling stating she is returning call for pt.

## 2016-12-28 NOTE — Telephone Encounter (Signed)
New message     Pt wife is returning call from Friday

## 2016-12-28 NOTE — Telephone Encounter (Signed)
Patient's wife (DPR) reports the patient has NOT been taking fish oil at all due to mild stomach upset.  Instructed her to have patient re-try fish oil with keeping the capsules in the freezer to avoid stomach upset. Patient will call if he cannot tolerate the fish oil. Otherwise, he will come fasting to echo appointment in November to recheck labs. DPR agrees with treatment plan.

## 2016-12-28 NOTE — Telephone Encounter (Signed)
Would confirm that pt is adherent to fish oil 2g daily. If so, would increase to 4g daily and encourage pt to limit intake of carbs, alcohol, and sugars. Recheck lipids in 3-6 months. Likely does not need visit with lipid clinic unless he has questions regarding lipid management or needs nutritional education.

## 2017-01-21 DIAGNOSIS — Z9849 Cataract extraction status, unspecified eye: Secondary | ICD-10-CM | POA: Diagnosis not present

## 2017-01-25 ENCOUNTER — Encounter: Payer: Self-pay | Admitting: Podiatry

## 2017-01-25 ENCOUNTER — Ambulatory Visit (INDEPENDENT_AMBULATORY_CARE_PROVIDER_SITE_OTHER): Payer: Medicare Other | Admitting: Podiatry

## 2017-01-25 DIAGNOSIS — L600 Ingrowing nail: Secondary | ICD-10-CM

## 2017-01-25 NOTE — Patient Instructions (Signed)

## 2017-01-26 NOTE — Progress Notes (Signed)
Subjective:    Patient ID: Darryl [name], male   DOB: 82 y.o.   MRN: [id number]   HPI patient states that his left big toenail is very brittle and becoming increasingly tender and hard to wear shoe gear with. Patient would like to have it removed if possible    ROS      Objective:  Physical Exam neurovascular status intact negative Homans sign was noted with patient's left hallux be moderately loose upon palpation with pain upon dorsal pressure. Also the nail was noted be thickened     Assessment:   Damaged left hallux nail with deformity      Plan:  H&P condition reviewed and recommended nail removal. I explained the procedure and risk and the fact will not regrow and nail and he wants this done understanding risk. Today I infiltrated the left hallux 60 mg Xylocaine Marcaine mixture remove the hallux nail exposed matrix and applied phenol 3 applications 30 seconds followed by alcohol lavage and sterile dressing

## 2017-02-01 ENCOUNTER — Telehealth: Payer: Self-pay | Admitting: Podiatry

## 2017-02-01 NOTE — Telephone Encounter (Signed)
I'm calling for my husband. He saw Dr. Paulla Dolly for a toenail procedure last Monday. It has looked good then bad, then good again, then bad again. We have been doing the soaking and putting the antibiotic ointment on it. It looked fine when he went to bed last night. This morning when he woke up his toe is all red again. On top where the toenail was it is a white substance that now looks black. Its not hot to the touch, but it is red. Please call me back to let me know if he needs to come in or if you can give me some advice. You can reach me at [phone].

## 2017-02-01 NOTE — Telephone Encounter (Signed)
I spoke with pt's wife, [name] and she states the area that is darkened does look like a partial scab, and is using white vinegar soaks instead of the epsom salt, because the epsom salt made the toe red. I told her to have pt use the white vinegar 1/2 cup to 1 qt water for 20 minutes 2x daily for 4-6 weeks, at the end of the 4th week do the last soak of the day leave off the antibiotic ointment bandaid and if the area developed a dry hard scab without redness, drainage, swelling or pain then he could stop the soaks, but if redness, pain, swelling or drainage was present, then continue the soaks another 2 weeks and test again. [name] states she has an appt for pt on Thursday, and I told her that would work well.

## 2017-02-04 ENCOUNTER — Ambulatory Visit: Payer: Medicare Other | Admitting: Podiatry

## 2017-02-26 DIAGNOSIS — L821 Other seborrheic keratosis: Secondary | ICD-10-CM | POA: Diagnosis not present

## 2017-02-26 DIAGNOSIS — D1801 Hemangioma of skin and subcutaneous tissue: Secondary | ICD-10-CM | POA: Diagnosis not present

## 2017-02-26 DIAGNOSIS — Z85820 Personal history of malignant melanoma of skin: Secondary | ICD-10-CM | POA: Diagnosis not present

## 2017-02-26 DIAGNOSIS — L820 Inflamed seborrheic keratosis: Secondary | ICD-10-CM | POA: Diagnosis not present

## 2017-02-26 DIAGNOSIS — D692 Other nonthrombocytopenic purpura: Secondary | ICD-10-CM | POA: Diagnosis not present

## 2017-02-26 DIAGNOSIS — L570 Actinic keratosis: Secondary | ICD-10-CM | POA: Diagnosis not present

## 2017-03-03 ENCOUNTER — Ambulatory Visit: Payer: Medicare Other | Admitting: Podiatry

## 2017-03-17 DIAGNOSIS — H01006 Unspecified blepharitis left eye, unspecified eyelid: Secondary | ICD-10-CM | POA: Diagnosis not present

## 2017-03-22 DIAGNOSIS — Z23 Encounter for immunization: Secondary | ICD-10-CM | POA: Diagnosis not present

## 2017-04-26 ENCOUNTER — Telehealth: Payer: Self-pay | Admitting: Cardiology

## 2017-04-26 NOTE — Telephone Encounter (Signed)
Answered question for patient's wife regarding a referral for her son-in-law.

## 2017-04-26 NOTE — Telephone Encounter (Signed)
New message    Pt is calling stating she spoke with someone earlier and she would like to speak to them again. She said she has one more question.

## 2017-04-26 NOTE — Telephone Encounter (Signed)
Spoke with patient's wife who called with a question about a referral for her son-in-law. I answered questions to her satisfaction and she thanked me for the call.

## 2017-04-26 NOTE — Telephone Encounter (Signed)
New message    Pt wife is calling asking for a call back from the nurse. She did not say what it was, just asked for a call.

## 2017-05-10 ENCOUNTER — Other Ambulatory Visit: Payer: Medicare Other | Admitting: *Deleted

## 2017-05-10 ENCOUNTER — Ambulatory Visit (HOSPITAL_COMMUNITY): Payer: Medicare Other | Attending: Cardiology

## 2017-05-10 ENCOUNTER — Other Ambulatory Visit: Payer: Self-pay

## 2017-05-10 DIAGNOSIS — I77810 Thoracic aortic ectasia: Secondary | ICD-10-CM

## 2017-05-10 DIAGNOSIS — I5189 Other ill-defined heart diseases: Secondary | ICD-10-CM

## 2017-05-10 DIAGNOSIS — E785 Hyperlipidemia, unspecified: Secondary | ICD-10-CM | POA: Diagnosis not present

## 2017-05-10 DIAGNOSIS — I081 Rheumatic disorders of both mitral and tricuspid valves: Secondary | ICD-10-CM | POA: Diagnosis not present

## 2017-05-10 DIAGNOSIS — I5032 Chronic diastolic (congestive) heart failure: Secondary | ICD-10-CM | POA: Diagnosis not present

## 2017-05-10 DIAGNOSIS — I519 Heart disease, unspecified: Secondary | ICD-10-CM

## 2017-05-10 DIAGNOSIS — I420 Dilated cardiomyopathy: Secondary | ICD-10-CM | POA: Diagnosis not present

## 2017-05-10 LAB — LIPID PANEL
Chol/HDL Ratio: 4.2 ratio (ref 0.0–5.0)
Cholesterol, Total: 131 mg/dL (ref 100–199)
HDL: 31 mg/dL — ABNORMAL LOW (ref >39)
LDL CALC: 50 mg/dL (ref 0–99)
TRIGLYCERIDES: 252 mg/dL — AB (ref 0–149)
VLDL Cholesterol Cal: 50 mg/dL — ABNORMAL HIGH (ref 5–40)

## 2017-05-10 LAB — HEPATIC FUNCTION PANEL
ALT: 13 IU/L (ref 0–44)
AST: 20 IU/L (ref 0–40)
Albumin: 4 g/dL (ref 3.5–4.8)
Alkaline Phosphatase: 108 IU/L (ref 39–117)
BILIRUBIN TOTAL: 0.7 mg/dL (ref 0.0–1.2)
Bilirubin, Direct: 0.19 mg/dL (ref 0.00–0.40)
Total Protein: 6.6 g/dL (ref 6.0–8.5)

## 2017-05-11 ENCOUNTER — Telehealth: Payer: Self-pay

## 2017-05-11 ENCOUNTER — Telehealth: Payer: Self-pay | Admitting: Cardiology

## 2017-05-11 DIAGNOSIS — I719 Aortic aneurysm of unspecified site, without rupture: Secondary | ICD-10-CM

## 2017-05-11 NOTE — Telephone Encounter (Signed)
Spoke with wife, DPR on file.  Advised of recommendations per our [facility].  Wife advised pt will be willing to increase Fish Oil but pt will not take statins.  Wife will call back to schedule labs in 3 months to make sure numbers are improving with increased Fish Oil.  Wife verbalized understanding and was appreciative for call.

## 2017-05-11 NOTE — Telephone Encounter (Signed)
New message     Patient spouse returning call for results. Please call

## 2017-05-11 NOTE — Telephone Encounter (Signed)
Patient's spouse (DPR on file) made aware of results. Patient verbalizes understanding and thanked me for the call. ECHO ordered for next year.

## 2017-05-31 ENCOUNTER — Telehealth: Payer: Self-pay | Admitting: Cardiology

## 2017-05-31 ENCOUNTER — Other Ambulatory Visit: Payer: Self-pay | Admitting: *Deleted

## 2017-05-31 MED ORDER — FUROSEMIDE 20 MG PO TABS: 20.0000 mg | ORAL_TABLET | Freq: Every day | ORAL | 1 refills | Status: DC

## 2017-05-31 NOTE — Telephone Encounter (Signed)
New Message      *STAT* If patient is at the pharmacy, call can be transferred to refill team.   1. Which medications need to be refilled? (please list name of each medication and dose if known)  furosemide (LASIX) 20 MG tablet Take 1 tablet (20 mg total) by mouth daily.     2. Which pharmacy/location (including street and city if local pharmacy) is medication to be sent to? [facility]  3. Do they need a 30 day or 90 day supply? [name]

## 2017-06-01 DIAGNOSIS — D485 Neoplasm of uncertain behavior of skin: Secondary | ICD-10-CM | POA: Diagnosis not present

## 2017-06-01 DIAGNOSIS — L820 Inflamed seborrheic keratosis: Secondary | ICD-10-CM | POA: Diagnosis not present

## 2017-08-23 ENCOUNTER — Other Ambulatory Visit: Payer: Self-pay | Admitting: Cardiology

## 2017-08-23 MED ORDER — KLOR-CON 10 10 MEQ PO TBCR: 10.0000 meq | EXTENDED_RELEASE_TABLET | Freq: Every day | ORAL | 0 refills | Status: DC

## 2017-08-23 NOTE — Telephone Encounter (Signed)
°*  STAT* If patient is at the pharmacy, call can be transferred to refill team.   1. Which medications need to be refilled? (please list name of each medication and dose if known) K[place]-Con 10 mg   2. Which pharmacy/location (including street and city if local pharmacy) is medication to be sent to?[place]  3. Do they need a 30 day or 90 day supply?[place]

## 2017-08-23 NOTE — Telephone Encounter (Signed)
Pt's medication was sent to pt's pharmacy as requested. Confirmation received.  °

## 2017-08-29 NOTE — Progress Notes (Signed)
Cardiology Office Note:    Date:  08/31/2017   ID:  Darryl [name], DOB 02-19-1935, MRN [id number]  PCP:  [name], MD  Cardiologist:  No primary care provider on file.    Referring MD: [name], MD   Chief Complaint  Patient presents with  . Coronary Artery Disease  . Hypertension  . Hyperlipidemia  . Congestive Heart Failure    History of Present Illness:    Darryl [name] is a 83 y.o. male with a hx of CAD s/p remote CABG 11/2003 and DES to radial-OM and SVG to RCA 04/2004, BMS to LCx in 2010. He also has a history of HTN, chronic LBBB, dyslipidemia and chronic diastolic CHF.  His exercise is limited by his Parkinson's but tries to walk with a cane.   He is here today for followup and is doing well.  He denies any chest pain or pressure, SOB, DOE, PND, orthopnea, LE edema, dizziness, palpitations or syncope. He is compliant with his meds and is tolerating meds with no SE.  He has chronic dizziness when going from sitting to standing likely related to his underlying Parkinson's disease.    Past Medical History:  Diagnosis Date  . Arthritis   . BPH (benign prostatic hypertrophy) with urinary retention   . Chronic diastolic CHF (congestive heart failure) ([place])   . Colon polyp   . Coronary atherosclerosis of native coronary artery    a. s/p CABG 11/2003. b. s/p DES to radial-OM and SVG-RCA 11/05. c. s/p BMS to LCx 11/2008.  . Dilated aortic root ([place]) 05/29/2014  . Diverticulosis   . Dyslipidemia    statin intolerant  . Elevated fasting glucose   . Gastritis   . Gastritis    a. by EGD 04/2004.  . H/O urinary retention    post surgical procedures; has foley cath  . Headache(784.0)   . Hiatal hernia   . HOH (hard of hearing)    slight hoh, rt>l  . HTN (hypertension)   . Hypertensive heart disease   . Insomnia   . Intracranial hemorrhage ([place])    a. small pontine [place] 2003.  [name] LBBB (left bundle branch block)   . Lumbar spondylolysis    multilevel DJD with mild  spinal stenosis L3-L5  . Migraines   . Parkinson's disease ([place])   . Peptic ulcer disease    a. prior hx of bleeding ulcers, including 12/2003 in setting of aspirin use.  . Renal insufficiency    renal duplex scan in 2012 showed no renal artery stenosis  . Sleep disturbance   . Stroke [facility])    a. remote hx of stroke reportedly r/t HTN.  [name] Thrombocytopenia ([place])   . Tremor   . UTI (urinary tract infection)   . Wears glasses     Past Surgical History:  Procedure Laterality Date  . APPENDECTOMY  62   1963  . CHOLECYSTECTOMY  1998  . COLON SURGERY  03/14/2008  . COLONOSCOPY  10/2001  . CORONARY ANGIOPLASTY  6/10,10/05   Dr. [name] cardiologist: last office visit 11/07/14  . CORONARY ARTERY BYPASS GRAFT  2005  . CYSTOSCOPY WITH LITHOLAPAXY N/A 11/19/2014   Procedure: CYSTOSCOPY WITH LITHOLAPAXY;  Surgeon: [name], MD;  Location: [facility];  Service: Urology;  Laterality: N/A;  . ESOPHAGOGASTRODUODENOSCOPY     10/2001, 12/2003, 04/2004  . LUMBAR LAMINECTOMY/DECOMPRESSION MICRODISCECTOMY N/A 05/03/2013   Procedure: LUMBAR LAMINECTOMY/DCOMPRESSION MICRODISCECTOMY RIGHT LUMBAR FOUR FIVE;  Surgeon: [name], MD;  Location: [facility];  Service: Neurosurgery;  Laterality: N/A;  . SHOULDER ARTHROSCOPY Left 9/10  . stones     hx  . TRANSURETHRAL RESECTION OF PROSTATE N/A 11/19/2014   Procedure: TRANSURETHRAL RESECTION OF THE PROSTATE WITH GYRUS INSTRUMENTS;  Surgeon: [name], MD;  Location: [facility];  Service: Urology;  Laterality: N/A;  . WISDOM TOOTH EXTRACTION      Current Medications: Current Meds  Medication Sig  . acetaminophen (TYLENOL) 500 MG tablet Take 1,000 mg by mouth every 8 (eight) hours as needed for mild pain or moderate pain.  [name] aspirin EC 81 MG tablet Take 81 mg by mouth. Patient takes 3 times a week  . carvedilol (COREG) 6.25 MG tablet Take 1 tablet (6.25 mg total) by mouth 2 (two) times daily.  . citalopram  (CELEXA) 20 MG tablet Take 20 mg by mouth daily.  . cloNIDine (CATAPRES) 0.2 MG tablet Take 0.2-0.3 mg by mouth 2 (two) times daily. Takes 1.5 tablet in am and 1.5 tablets in PM daily  . furosemide (LASIX) 20 MG tablet Take 1 tablet (20 mg total) by mouth daily.  [name] gabapentin (NEURONTIN) 100 MG capsule Take 50 mg by mouth at bedtime.   . hydrALAZINE (APRESOLINE) 25 MG tablet Take 1 tablet (25 mg total) by mouth 3 (three) times daily.  . irbesartan (AVAPRO) 300 MG tablet Take 300 mg by mouth every morning.  [name] KLOR-CON 10 10 MEQ tablet Take 1 tablet (10 mEq total) by mouth daily.  . nitroGLYCERIN (NITROSTAT) 0.4 MG SL tablet Place 1 tablet (0.4 mg total) under the tongue every 5 (five) minutes as needed for chest pain.  . pantoprazole (PROTONIX) 40 MG tablet Take 40 mg by mouth as directed.   . traMADol (ULTRAM) 50 MG tablet Take 50 mg by mouth daily as needed (pain).   [name] VITAMIN E PO Take 500 mg by mouth daily.     Allergies:   Ambien [zolpidem tartrate]; Aspirin; Celebrex [celecoxib]; Nsaids; Toprol xl [metoprolol tartrate]; Altace [ramipril]; Amlodipine; Levothyroxine; Biaxin [clarithromycin]; and Tetracyclines & related   Social History   Socioeconomic History  . Marital status: Married    Spouse name: [name]  . Number of children: 4  . Years of education: College  . Highest education level: None  Social Needs  . Financial resource strain: None  . Food insecurity - worry: None  . Food insecurity - inability: None  . Transportation needs - medical: None  . Transportation needs - non-medical: None  Occupational History  . None  Tobacco Use  . Smoking status: Former Smoker    Packs/day: 2.00    Years: 5.00    Pack years: 10.00    Types: Cigarettes    Last attempt to quit: 04/28/1959    Years since quitting: 58.3  . Smokeless tobacco: Never Used  . Tobacco comment: 55+ yrs ago  Substance and Sexual Activity  . Alcohol use: No  . Drug use: No  . Sexual activity: None  Other  Topics Concern  . None  Social History Narrative   Patient is married to [name]), has 4 children   Patient is right handed   Education level is college   Caffeine consumption is none     Family History: The patient's family history includes CAD in his sister; CAD (age of onset: 62) in his father.  ROS:   Please see the history of present illness.    Review of Systems  Musculoskeletal: Positive for back pain.  Neurological: Positive for headaches.    All other systems reviewed and negative.   EKGs/Labs/Other Studies Reviewed:    The following studies were reviewed today: none  EKG:  EKG is  ordered today.  The ekg ordered today demonstrates Sinus bradycardia at 58bpm with LBBB  Recent Labs: 05/10/2017: ALT 13   Recent Lipid Panel    Component Value Date/Time   CHOL 131 05/10/2017 1011   TRIG 252 (H) 05/10/2017 1011   HDL 31 (L) 05/10/2017 1011   CHOLHDL 4.2 05/10/2017 1011   CHOLHDL 4.8 06/04/2016 0802   VLDL 69 (H) 06/04/2016 0802   LDLCALC 50 05/10/2017 1011   LDLDIRECT 42.0 12/06/2014 0757    Physical Exam:    VS:  BP 136/88   Pulse (!) 58   Ht 5\' 8"  (1.727 m)   Wt 183 lb 12.8 oz (83.4 kg)   BMI 27.95 kg/m     Wt Readings from Last 3 Encounters:  08/31/17 183 lb 12.8 oz (83.4 kg)  12/18/16 187 lb 6.4 oz (85 kg)  10/19/16 187 lb 3.2 oz (84.9 kg)     GEN:  Well nourished, well developed in no acute distress HEENT: Normal NECK: No JVD; No carotid bruits LYMPHATICS: No lymphadenopathy CARDIAC: RRR, no murmurs, rubs, gallops RESPIRATORY:  Clear to auscultation without rales, wheezing or rhonchi  ABDOMEN: Soft, non-tender, non-distended MUSCULOSKELETAL:  No edema; No deformity  SKIN: Warm and dry NEUROLOGIC:  Alert and oriented x 3 PSYCHIATRIC:  Normal affect   ASSESSMENT:    1. Coronary artery disease involving native coronary artery of native heart without angina pectoris   2. Benign essential HTN   3. Chronic diastolic heart failure ([place])     4. Dilated aortic root ([place])   5. Orthostatic hypotension   6. Dyslipidemia   7. Hypertensive heart disease with heart failure ([place])    PLAN:    In order of problems listed above:  1.  ASCAD - s/p remote CABG 11/2003 and DES to radial-OM and SVG to RCA 04/2004, BMS to LCx 2010.  he has not had any further anginal symptoms.  He will continue on ASA and BB.  2.  HTN - BP is well controlled on exam today.  He will continue on clonidine 0.2 mg twice daily, hydralazine 25 mg 3 times daily, irbesartan 300 mg daily and carvedilol 6.25 mg twice daily.  3.  Chronic diastolic CHF - he appears euvolemic on exam today.  He will continue on furosemide 20 mg daily.  4.  Dilated aortic root - 20mm by echo 04/2017 - repeat 04/2018.  5.  Orthostatic hypotension - well controlled with no recent dizzy spells.  6  Dyslipidemia with LDL goal < 70.  He is statin intolerant.  His LDL was at [facility] November 2018 at 5.   Medication Adjustments/Labs and Tests Ordered: Current medicines are reviewed at length with the patient today.  Concerns regarding medicines are outlined above.  No orders of the defined types were placed in this encounter.  No orders of the defined types were placed in this encounter.   Signed, [name], MD  08/31/2017 9:15 AM    [place]

## 2017-08-31 ENCOUNTER — Ambulatory Visit (INDEPENDENT_AMBULATORY_CARE_PROVIDER_SITE_OTHER): Payer: Medicare Other | Admitting: Cardiology

## 2017-08-31 ENCOUNTER — Encounter: Payer: Self-pay | Admitting: Cardiology

## 2017-08-31 VITALS — BP 136/88 | HR 58 | Ht 68.0 in | Wt 183.8 lb

## 2017-08-31 DIAGNOSIS — I10 Essential (primary) hypertension: Secondary | ICD-10-CM | POA: Diagnosis not present

## 2017-08-31 DIAGNOSIS — I77810 Thoracic aortic ectasia: Secondary | ICD-10-CM

## 2017-08-31 DIAGNOSIS — I951 Orthostatic hypotension: Secondary | ICD-10-CM | POA: Diagnosis not present

## 2017-08-31 DIAGNOSIS — E785 Hyperlipidemia, unspecified: Secondary | ICD-10-CM

## 2017-08-31 DIAGNOSIS — I2510 Atherosclerotic heart disease of native coronary artery without angina pectoris: Secondary | ICD-10-CM | POA: Diagnosis not present

## 2017-08-31 DIAGNOSIS — I110 Hypertensive heart disease with heart failure: Secondary | ICD-10-CM

## 2017-08-31 DIAGNOSIS — I5032 Chronic diastolic (congestive) heart failure: Secondary | ICD-10-CM

## 2017-08-31 LAB — BASIC METABOLIC PANEL
BUN / CREAT RATIO: 15 (ref 10–24)
BUN: 19 mg/dL (ref 8–27)
CO2: 27 mmol/L (ref 20–29)
CREATININE: 1.29 mg/dL — AB (ref 0.76–1.27)
Calcium: 9.5 mg/dL (ref 8.6–10.2)
Chloride: 104 mmol/L (ref 96–106)
GFR, EST AFRICAN AMERICAN: 60 mL/min/{1.73_m2} (ref >59)
GFR, EST NON AFRICAN AMERICAN: 52 mL/min/{1.73_m2} — AB (ref >59)
Glucose: 132 mg/dL — ABNORMAL HIGH (ref 65–99)
POTASSIUM: 4.3 mmol/L (ref 3.5–5.2)
SODIUM: 142 mmol/L (ref 134–144)

## 2017-08-31 MED ORDER — FUROSEMIDE 20 MG PO TABS: 20.0000 mg | ORAL_TABLET | Freq: Every day | ORAL | 3 refills | Status: DC

## 2017-08-31 MED ORDER — CARVEDILOL 6.25 MG PO TABS: 6.2500 mg | ORAL_TABLET | Freq: Two times a day (BID) | ORAL | 3 refills | Status: DC

## 2017-08-31 MED ORDER — HYDRALAZINE HCL 25 MG PO TABS: 25.0000 mg | ORAL_TABLET | Freq: Three times a day (TID) | ORAL | 3 refills | Status: DC

## 2017-08-31 MED ORDER — NITROGLYCERIN 0.4 MG SL SUBL: 0.4000 mg | SUBLINGUAL_TABLET | SUBLINGUAL | 12 refills | Status: AC | PRN

## 2017-08-31 MED ORDER — KLOR-CON 10 10 MEQ PO TBCR: 10.0000 meq | EXTENDED_RELEASE_TABLET | Freq: Every day | ORAL | 3 refills | Status: DC

## 2017-08-31 NOTE — Addendum Note (Signed)
Addended by: Harland German A on: 08/31/2017 09:37 AM   Modules accepted: Orders

## 2017-08-31 NOTE — Patient Instructions (Signed)
Medication Instructions:  Your provider recommends that you continue on your current medications as directed. Please refer to the Current Medication list given to you today.    Labwork: TODAY: BMET  Testing/Procedures: None  Follow-Up: Your provider wants you to follow-up in: 6 months with Dr. Radford Pax. You will receive a reminder letter in the mail two months in advance. If you don't receive a letter, please call our office to schedule the follow-up appointment.    Any Other Special Instructions Will Be Listed Below (If Applicable).     If you need a refill on your cardiac medications before your next appointment, please call your pharmacy.

## 2017-09-15 DIAGNOSIS — F3342 Major depressive disorder, recurrent, in full remission: Secondary | ICD-10-CM | POA: Diagnosis not present

## 2017-09-15 DIAGNOSIS — I10 Essential (primary) hypertension: Secondary | ICD-10-CM | POA: Diagnosis not present

## 2017-09-15 DIAGNOSIS — F411 Generalized anxiety disorder: Secondary | ICD-10-CM | POA: Diagnosis not present

## 2017-09-15 DIAGNOSIS — M4727 Other spondylosis with radiculopathy, lumbosacral region: Secondary | ICD-10-CM | POA: Diagnosis not present

## 2017-09-15 DIAGNOSIS — K2950 Unspecified chronic gastritis without bleeding: Secondary | ICD-10-CM | POA: Diagnosis not present

## 2017-09-29 ENCOUNTER — Ambulatory Visit (INDEPENDENT_AMBULATORY_CARE_PROVIDER_SITE_OTHER): Payer: Medicare Other | Admitting: Neurology

## 2017-09-29 ENCOUNTER — Encounter: Payer: Self-pay | Admitting: Neurology

## 2017-09-29 ENCOUNTER — Telehealth: Payer: Self-pay | Admitting: Neurology

## 2017-09-29 VITALS — BP 131/85 | HR 67 | Ht 68.0 in | Wt 184.4 lb

## 2017-09-29 DIAGNOSIS — G20 Parkinson's disease: Secondary | ICD-10-CM

## 2017-09-29 DIAGNOSIS — I2510 Atherosclerotic heart disease of native coronary artery without angina pectoris: Secondary | ICD-10-CM | POA: Diagnosis not present

## 2017-09-29 NOTE — Patient Instructions (Signed)
Parkinson Disease Parkinson disease is a long-term (chronic) condition that gets worse over time (is progressive). Parkinson disease limits your ability to control your movements and move your body normally. This condition is a type of movement disorder. Each person with Parkinson disease is affected differently. The condition can range from mild to severe. Parkinson disease tends to progress slowly over several years. What are the causes? Parkinson disease results from a loss of brain cells (neurons) in a specific part of the brain (substantia nigra). Some of the neurons in the substantia nigra make an important brain chemical (dopamine). Dopamine is needed to control movement. As the condition gets worse, neurons make less dopamine. This makes it hard to move or control your movements. The exact cause of why neurons are lost or produce less dopamine is not known. Genetic and environmental factors may contribute to the cause of Parkinson disease. What increases the risk? This condition is more likely to develop in:  Men.  People who are 60 years of age or older.  People who have a family history of Parkinson disease.  What are the signs or symptoms? Symptoms of this condition can vary from person to person. The main (primary) symptoms are related to movement (motor symptoms). These include:  Uncontrolled shaking movements (tremor). Tremors usually start in a hand or foot when you are resting (resting tremor). The tremor may stop when you move around.  Slowing of movement. You may lose facial expression and have trouble making the small movements that are needed to button clothing or brush your teeth. You may walk with short, shuffling steps.  Stiff movement (rigidity). This mostly affects your arms, legs, neck, and upper body. You may walk without swinging your arms. Rigidity can be painful.  Loss of balance and stability when standing. You may sway, fall backward, and have trouble making  turns.  Secondary motor symptoms of this condition include:  Shrinking handwriting.  Stooped posture.  Slowed speech.  Trouble swallowing.  Drooling.  Sexual dysfunction.  Muscle cramps.  Loss of smell.  Additional symptoms that are not related to movement include:  Constipation.  Mood swings.  Depression or anxiety.  Sleep disturbances.  Confusion.  Loss of mental abilities (dementia).  Low blood pressure.  Trouble concentrating.  How is this diagnosed? Parkinson disease can be hard to diagnose in its early stages. A diagnosis may be made based on symptoms, a medical history, and physical exam. During your exam, your health care provider will look for:  Lack of facial expression.  Resting tremor.  Stiffness in your neck, arms, and legs.  Abnormal walk.  Trouble with balance.  You may have brain imaging tests done to check for a loss of dopamine-producing areas of the brain. Your healthcare provider may also grade the severity of your condition as mild, moderate, or advanced. Parkinson disease progression is different for everyone. You may not progress to the advanced stage. Mild Parkinson disease involves:  Movement problems that do not affect daily activities.  Movement problems on one side of the body.  Movement problems that are controlled with medicines.  Good response to exercise.  Moderate Parkinson disease involves:  Movement problems on both sides of the body.  Slowing of movement.  Coordination and balance problems.  Less of a response to medicine.  More side effects from medicines.  Advanced Parkinson disease involves:  Extreme difficulty walking.  Inability to live alone safely.  Signs of dementia.  Difficulty controlling symptoms with medicine.  How is   this treated? There is no cure for Parkinson disease. Treatment focuses on relieving your symptoms. Treatment may include:  Medicines.  Speech, occupational, and  physical therapy.  Surgery.  Everyone responds to medicines differently. Your response may change over time. Work with your health care provider to find the best medicines for you. These may include:  Dopamine replacement drugs. These are the most effective medicines. A long-term side effect of these medicines is uncontrolled movements (dyskinesias).  Dopamine agonists. These drugs act like dopamine to stimulate dopamine receptors in the brain. Side effects include nausea and sleepiness, but they cause less dyskinesia.  Other medicines to reduce tremor, prevent dopamine breakdown, reduce dyskinesia, and reduce dementia that is related to Parkinson disease.  Another treatment is deep brain stimulation surgery to reduce tremors and dyskinesia. This procedure involves placing electrodes in the brain. The electrodes are attached to an electric pulse generator that acts like a pacemaker for your brain. This may be an option if you have had the condition for at least four years and are not responding well to medicines. Follow these instructions at home:  Take over-the-counter and prescription medicines only as told by your health care provider.  Install grab bars and railings in your home to prevent falls.  Follow instructions from your health care provider about eating or drinking restrictions.  Return to your normal activities as told by your health care provider. Ask your health care provider what activities are safe for you.  Get regular exercise as told by your health care provider or a physical therapist.  Keep all follow-up visits as told by your health care provider. This is important. These include any visits with a speech therapist or occupational therapist.  Consider joining a support group for people with Parkinson disease. Contact a health care provider if:  Medicines do not help your symptoms.  You are unsteady or have fallen at home.  You need more support to function well  at home.  You have trouble swallowing.  You have severe constipation.  You are struggling with side effects from your medicines.  You see or hear things that are not real (hallucinate).  You feel confused, anxious, or depressed. Get help right away if:  You are injured after a fall.  You cannot swallow without choking.  You have chest pain or trouble breathing.  You do not feel safe at home. This information is not intended to replace advice given to you by your health care provider. Make sure you discuss any questions you have with your health care provider. Document Released: 06/12/2000 Document Revised: 11/18/2015 Document Reviewed: 04/05/2015 Elsevier Interactive Patient Education  2018 [employer]  

## 2017-09-29 NOTE — Telephone Encounter (Signed)
Pt. States he will call us to schedule 1 yr f/u

## 2017-09-29 NOTE — Progress Notes (Signed)
[facility]     Provider:  Dr Jaynee Eagles Referring Provider: [name], MD Primary Care Physician:  Tawanna Solo, MD  CC: Parkinsonism   Interval history 09/29/2017: Patient and wife here for follow-up on parkinsonism.  Idiopathic Parkinson's disease versus vascular parkinsonism.  He did not tolerate Sinemet in the past.  We have sent him to physical therapy, encouraged him to use his walker.  He has had falls in the past.  He also has chronic low back pain and stiffness in the low back with radicular symptoms.  He has denied any memory changes in the past and declined an MMSE.  He has denied REM sleep disorder in the past.  Has also denied any freezing in the past.   has significant white matter changes in the brain, extensive changes of chronic microvascular disease and scattered multiple micro 8 hemorrhages of remote age. Today they say he forgets some tings but it is not significant and declines Exelon. No Falls. Using walker most of the time and a cane as well. Discussed PT can order when they want it. They will call. No choking on food. No hallucinations or delusions during the day. No dizziness. He has constipation and they treat it.   He is waking up in the middle of the night thinking his grandchildren are there, always when sleeping, he may call out his name, not acting out dreams, may be the start of REM Sleep disorder. Doesn't happen during the day.  Interval history 10/19/2016: Patient  Interval history 10/19/2016: In March patient had a near fall, almost went down, both legs collapsed in the setting of starting Gabapentin. Since then medications have been decreased and doing better. No falls since then. Still having pain in the back and right leg and sees Dr. Brien Few for this. Discussed repeat Home PT and he declined again. Walking is slowed. In November he was going down an incline and couldn't stop. He refuses to use a cane or a walker. Sleeping is fine. Memory is  fine. No dizziness when he stands up. No weakness. He takes b12 supplementation.   Interval history 07/13/2016: Patient is here for parkinsonism, idiopathic parkinson's disease vs vascular parkinsonism. Started him on Sinemet but he did not tolerate. His tremors are improved. He refuses to use his walker, I encouraged him to use it especially since he recently fell. PT in their home was "awesome". He has not been taking his B12, I discussed again he has B12 deficiency and he should be taking B12. Discussed Parkinson;s disease vs parkinsonism wife wife, answered all questions. He is falling more, will request home PT again for decline.  Interval history 03/13/2016: He has chronic low back pain, stiffness in the low back, and shoots down the right leg. Constant aching in the right leg. Had about 20 shots in the low back. His legs are getting weaker, shuffling. He goes to Dr. Brien Few at [facility] and spine. They deny any memory changes, they declined an MMSE, advised if he is having memory loss we could start medication that could slow down memory loss. Tremor is stable, mostly on the right, none in the left. Volume of voice is getting lower. He has some choking with food. I recommended a swallow eval for aspiration but they declined. Denies REM sleep disorder. No snoring, no witnessed apneic events. No drooling or wet pillows or increased saliva. He is shuffling, he fell in July after walking in the yard and he couldn't stop. He is supposed  to use a walker but they don;t use it, will request [facility] for PT. No loss of taste or smell. No incontinence. Handwriting is sloppy, looks like a 57-year old. When he stands up he has to stand for a few minutes. No freezing.   He has significant white-matter changes in the brain; extensive changes of chronic microvascular disease and scattered multiple microhemorrhages of remote age. Reviewed images with patient and wife again today.   ZOX:WRUEAVW S Teacheyis a 83  y.o.malehere as a referral from Dr. Alcide Clever Parkinson's disease  He has been having urinary retention. He has a foley. He developed urinary tract infection and sepsis. Been trying to take the foley out, but he cannot urinate. He has an appointment for urodynamics and being treated for enlarged prostate. Urologist wanted him evaluated for urinary retention in relation to PD. He has been having urinary difficulty since 2005. This has happened in the past in 2005 after surgery, then in 2010 after syrgery where he had to wear a foley until he could urinate again. Then in November of 2014 after back surgery. This time his urinary retention happened after epidural steroid injections for LBP. Patient has been followed in clinic for PD but has declined medication in the past as he feels his symptoms are not severe enough and manageable without medications.  Interval Update 06/12/2014: Tremor is stable. Sometimes you can see it and other times it is not there. No problems eating, dressing. Right is worse so needs to use left hand. Right >>left. Handwriting is getting worse. Walking is getting a little slower. Has been working with the cardiologist for tight blood pressure control. No muscle stiffness. No falls. Not using a walking aid. No REM sleep disorder. Sense of smell is fine. Has constipation since starting the celexa for anxiety. No orthostatic dizzines.   12/12/2013 Dr. Janann Colonel: JEZIEL HOFFMANN is a 83 y.o. male here as a follow up from Dr. Rex Kras for tremor evaluation. Last visit was 08/2013 at which time there was concern over possible amyloid angiopathy. He was evaluated by Dr Leonie Man with findings likely related to chronic HTN/small vessel disease. His ASA 81mg  was changed to every other day due to bruising. Overall doing well. Continues to have difficulties with his blood pressure. Currently working with Dr [name] (cardiology).   The tremor is stable, no worsening, remains stable. He  currently reports some pain radiating from lower back down the posterior surface of his right leg. He has a history of lumbar disc herniation s/p surgery.    Initial visit 07/2013 Dr. Janann Colonel: First noticed tremor around 3 years ago, started on right but now involves both hands but is R>L. Described as both a rest and action/postural tremor. Bothers him the most when trying to eat. Handwriting has gotten messier, no change in size. Wife notes some generalized slowing down, some shuffling of his feet. No muscle stiffness or cramps. He does not some gait instability, has some trouble getting in and out of the car. Feels very unsteady going down stairs. Has not noticed anything that makes the tremor worse. No intake of caffeine or EtOH. Sleeps well, wife notes some REM behavior disorder (talking in his sleep). No known difficulty with sense of smell. No known stroke or TIA, has had CAD with CABG x 4.   He thinks his father may have had a tremor.   Reviewed notes, labs and imaging from outside physicians, which showed: MRI of the brain 07/2013: Abnormal MRI scan of the  brain showing extensive changes of chronic microvascular disease and scattered multiple microhemorrhages of remote age. Possibilities include amyloid angiopathy versus small vessel disease.   Review of Systems: Patient complains of symptoms per HPI as well as the following symptoms: Jevity change, difficulty urinating, headache, tremors, passing out, ringing in ears, joint pain, back pain, walking difficulty, restless legs, daytime sleepiness. Pertinent negatives per HPI. All others negative.  Social History   Socioeconomic History  . Marital status: Married    Spouse name: [name]  . Number of children: 4  . Years of education: College  . Highest education level: Not on file  Occupational History  . Not on file  Social Needs  . Financial resource strain: Not on file  . Food insecurity:    Worry: Not on file    Inability: Not  on file  . Transportation needs:    Medical: Not on file    Non-medical: Not on file  Tobacco Use  . Smoking status: Former Smoker    Packs/day: 2.00    Years: 5.00    Pack years: 10.00    Types: Cigarettes    Last attempt to quit: 04/28/1959    Years since quitting: 58.4  . Smokeless tobacco: Never Used  . Tobacco comment: 55+ yrs ago  Substance and Sexual Activity  . Alcohol use: No  . Drug use: No  . Sexual activity: Not on file  Lifestyle  . Physical activity:    Days per week: Not on file    Minutes per session: Not on file  . Stress: Not on file  Relationships  . Social connections:    Talks on phone: Not on file    Gets together: Not on file    Attends religious service: Not on file    Active member of club or organization: Not on file    Attends meetings of clubs or organizations: Not on file    Relationship status: Not on file  . Intimate partner violence:    Fear of current or ex partner: Not on file    Emotionally abused: Not on file    Physically abused: Not on file    Forced sexual activity: Not on file  Other Topics Concern  . Not on file  Social History Narrative   Patient is married to [name]), has 4 children   Patient is right handed   Education level is college   Caffeine consumption is none    Family History  Problem Relation Age of Onset  . CAD Father 13       ASCAD  . CAD Sister     Past Medical History:  Diagnosis Date  . Arthritis   . BPH (benign prostatic hypertrophy) with urinary retention   . Chronic diastolic CHF (congestive heart failure) ([place])   . Colon polyp   . Coronary atherosclerosis of native coronary artery    a. s/p CABG 11/2003. b. s/p DES to radial-OM and SVG-RCA 11/05. c. s/p BMS to LCx 11/2008.  . Dilated aortic root ([place]) 05/29/2014   71mm by echo 04/2017  . Diverticulosis   . Dyslipidemia    statin intolerant  . Elevated fasting glucose   . Gastritis    a. by EGD 04/2004.  . H/O urinary retention    post  surgical procedures; has foley cath  . Headache(784.0)   . Hiatal hernia   . HOH (hard of hearing)    slight hoh, rt>l  . HTN (hypertension)   . Hypertensive heart  disease   . Insomnia   . Intracranial hemorrhage ([place])    a. small pontine [place] 2003.  [name] LBBB (left bundle branch block)   . Lumbar spondylolysis    multilevel DJD with mild spinal stenosis L3-L5  . Migraines   . Parkinson's disease ([place])   . Peptic ulcer disease    a. prior hx of bleeding ulcers, including 12/2003 in setting of aspirin use.  . Renal insufficiency    renal duplex scan in 2012 showed no renal artery stenosis  . Sleep disturbance   . Stroke [facility])    a. remote hx of stroke reportedly r/t HTN.  [name] Thrombocytopenia ([place])   . Tremor   . UTI (urinary tract infection)   . Wears glasses     Past Surgical History:  Procedure Laterality Date  . APPENDECTOMY  62   1963  . CHOLECYSTECTOMY  1998  . COLON SURGERY  03/14/2008  . COLONOSCOPY  10/2001  . CORONARY ANGIOPLASTY  6/10,10/05   Dr. [name] cardiologist: last office visit 11/07/14  . CORONARY ARTERY BYPASS GRAFT  2005  . CYSTOSCOPY WITH LITHOLAPAXY N/A 11/19/2014   Procedure: CYSTOSCOPY WITH LITHOLAPAXY;  Surgeon: [name], MD;  Location: [facility];  Service: Urology;  Laterality: N/A;  . ESOPHAGOGASTRODUODENOSCOPY     10/2001, 12/2003, 04/2004  . LUMBAR LAMINECTOMY/DECOMPRESSION MICRODISCECTOMY N/A 05/03/2013   Procedure: LUMBAR LAMINECTOMY/DCOMPRESSION MICRODISCECTOMY RIGHT LUMBAR FOUR FIVE;  Surgeon: [name], MD;  Location: [facility];  Service: Neurosurgery;  Laterality: N/A;  . SHOULDER ARTHROSCOPY Left 9/10  . stones     hx  . TRANSURETHRAL RESECTION OF PROSTATE N/A 11/19/2014   Procedure: TRANSURETHRAL RESECTION OF THE PROSTATE WITH GYRUS INSTRUMENTS;  Surgeon: [name], MD;  Location: [facility];  Service: Urology;  Laterality: N/A;  . WISDOM TOOTH EXTRACTION      Current Outpatient  Medications  Medication Sig Dispense Refill  . acetaminophen (TYLENOL) 500 MG tablet Take 1,000 mg by mouth every 8 (eight) hours as needed for mild pain or moderate pain.    [name] aspirin EC 81 MG tablet Take 81 mg by mouth. Patient takes 3 times a week    . carvedilol (COREG) 6.25 MG tablet Take 1 tablet (6.25 mg total) by mouth 2 (two) times daily. 180 tablet 3  . citalopram (CELEXA) 20 MG tablet Take 20 mg by mouth daily.    . cloNIDine (CATAPRES) 0.2 MG tablet Take 0.2-0.3 mg by mouth 2 (two) times daily. Takes 1.5 tablet in am and 1.5 tablets in PM daily    . furosemide (LASIX) 20 MG tablet Take 1 tablet (20 mg total) by mouth daily. 90 tablet 3  . gabapentin (NEURONTIN) 100 MG capsule Take 50 mg by mouth at bedtime.     . hydrALAZINE (APRESOLINE) 25 MG tablet Take 1 tablet (25 mg total) by mouth 3 (three) times daily. 270 tablet 3  . irbesartan (AVAPRO) 300 MG tablet Take 300 mg by mouth every morning.    [name] KLOR-CON 10 10 MEQ tablet Take 1 tablet (10 mEq total) by mouth daily. 90 tablet 3  . nitroGLYCERIN (NITROSTAT) 0.4 MG SL tablet Place 1 tablet (0.4 mg total) under the tongue every 5 (five) minutes as needed for chest pain. 25 tablet 12  . pantoprazole (PROTONIX) 40 MG tablet Take 40 mg by mouth as directed.     . traMADol (ULTRAM) 50 MG tablet Take 50 mg by mouth daily as needed (pain).     [name]  VITAMIN E PO Take 500 mg by mouth daily.     No current facility-administered medications for this visit.     Allergies as of 09/29/2017 - Review Complete 09/29/2017  Allergen Reaction Noted  . Ambien [zolpidem tartrate] Other (See Comments) 11/19/2014  . Aspirin Other (See Comments) 04/27/2013  . Celebrex [celecoxib] Other (See Comments) 04/10/2013  . Nsaids Other (See Comments) 04/10/2013  . Toprol xl [metoprolol tartrate] Swelling 04/10/2013  . Altace [ramipril]  04/10/2013  . Amlodipine  10/25/2014  . Levothyroxine Other (See Comments) 08/11/2013  . Biaxin [clarithromycin] Nausea Only  04/10/2013  . Tetracyclines & related Nausea Only 04/10/2013    Vitals: BP 131/85 (BP Location: Right Arm, Patient Position: Sitting)   Pulse 67   Ht 5\' 8"  (1.727 m)   Wt 184 lb 6.4 oz (83.6 kg)   BMI 28.04 kg/m  Last Weight:  Wt Readings from Last 1 Encounters:  09/29/17 184 lb 6.4 oz (83.6 kg)   Last Height:   Ht Readings from Last 1 Encounters:  09/29/17 5\' 8"  (1.727 m)    Cranial Nerves:  The pupils are equal, round, and reactive to light. Visual fields are full to finger confrontation. Extraocular movements are intact. Trigeminal sensation is intact and the muscles of mastication are normal. The face is symmetric. The palate elevates in the midline. Voice is normal. Shoulder shrug is normal. The tongue has normal motion without fasciculations.   Coordination: Mild bradykinesia noted with finger tapping left greater than right, hand opening closing left greater than right and rapid alternating movement. Bradykinesia with tapping of left foot, right within normal limits.  Gait:  decreased arm swing on the left, shuffling of steps, no re emergent tremor in the right, Romberg negative, multiple steps back with pull test   Motor Observation:  Minimal rest tremor noted right hand greater than left, bilateral intention and postural tremor Tone:  cogwheeling bilateral wrists  Posture:  Posture is stooped   Strength:  Strength is V/V in the upper and lower limbs.      Assessment/Plan:83 year-old gentleman presenting for follow up evaluation of parkinsonism, which is been ongoing for multiple years. Symptoms c/w parkinsonism, likely idiopathic but vascular PD also possible given his extensive changes of chronic microvascular disease and scattered multiple microhemorrhages of remote age on MRI of the brain. Reviewed images with patient and wife in the past when discussing vascular parkinsonism vs parkinson's disase. Marland KitchenHe describes both a rest and action  component to tremor, he feels action component is causing the most difficulty. MRI brain reviewed showed multiple micro-hemorrhages. Was evaluated by Dr Leonie Man who suggested aggressive BP control of <130/90. Given his extensive changes of chronic microvascular disease and managing blood pressure and other vascular risk factors is important, advised them again today  - Patient is resistant to physical therapy, trying any more medications, discussion about memory loss, swallow evaluation. At this point is not much that I can do for patient. I will follow up with him in 1 year. - Did not tolerate Sinemet or Rytary .  - PT in home: he declined - Suggest swallow study for aspiration, they decline - B12 263 with elevated MMA c/w B12 deficiency advised daily B12 supplementation. Discussed risks of B12 deficiency including  - He denies memory problems. Patient states he is taking a supplement. Follow up with primary care. - Given his significant white-matter changes in the brain; extensive changes of chronic microvascular disease and scattered multiple microhemorrhages of remote age I wouldn't  be surprised if patient had significant memory loss but they deny anything significant - declined anymemory testing in the office. Recommended Exelon, they decline. - Falls: encouraged to use walker and f/u with [facility] for lumbar radiculopathy - He is waking up in the middle of the night thinking his grandchildren are there, always when sleeping, he may call out his name, not acting out dreams, may be the start of REM Sleep disorder. Doesn't happen during the day. Not disturbing, can be managed. In the future may consider Clonazepam if needed.  - Discussed exercise and medications have shown to slow progression - Increased risk of skin cancers, yet regular checkups  -  Home PT for parkinson's disease as well a sciatica, they will call when they want the order placed when/if ready  Sarina Ill, MD  [facility] [address] [place], [place] [address]  Phone [phone] Fax [phone]  A total of 25 minutes was spent face-to-face with this patient. Over half this time was spent on counseling patient on the Parkinsonism diagnosis and different diagnostic and therapeutic options available.

## 2017-10-08 ENCOUNTER — Emergency Department (HOSPITAL_COMMUNITY): Payer: Medicare Other

## 2017-10-08 ENCOUNTER — Other Ambulatory Visit: Payer: Self-pay

## 2017-10-08 ENCOUNTER — Telehealth: Payer: Self-pay | Admitting: Cardiology

## 2017-10-08 ENCOUNTER — Encounter (HOSPITAL_COMMUNITY): Payer: Self-pay | Admitting: Emergency Medicine

## 2017-10-08 ENCOUNTER — Telehealth: Payer: Self-pay | Admitting: Neurology

## 2017-10-08 ENCOUNTER — Emergency Department (HOSPITAL_COMMUNITY)
Admission: EM | Admit: 2017-10-08 | Discharge: 2017-10-08 | Disposition: A | Discharge status: Home / Self Care | Payer: Medicare Other | Attending: Emergency Medicine | Admitting: Emergency Medicine

## 2017-10-08 DIAGNOSIS — Z8673 Personal history of transient ischemic attack (TIA), and cerebral infarction without residual deficits: Secondary | ICD-10-CM | POA: Insufficient documentation

## 2017-10-08 DIAGNOSIS — G20 Parkinson's disease: Secondary | ICD-10-CM | POA: Insufficient documentation

## 2017-10-08 DIAGNOSIS — N390 Urinary tract infection, site not specified: Secondary | ICD-10-CM

## 2017-10-08 DIAGNOSIS — R5383 Other fatigue: Secondary | ICD-10-CM | POA: Diagnosis not present

## 2017-10-08 DIAGNOSIS — Z951 Presence of aortocoronary bypass graft: Secondary | ICD-10-CM | POA: Insufficient documentation

## 2017-10-08 DIAGNOSIS — I130 Hypertensive heart and chronic kidney disease with heart failure and stage 1 through stage 4 chronic kidney disease, or unspecified chronic kidney disease: Secondary | ICD-10-CM | POA: Insufficient documentation

## 2017-10-08 DIAGNOSIS — I5032 Chronic diastolic (congestive) heart failure: Secondary | ICD-10-CM | POA: Diagnosis not present

## 2017-10-08 DIAGNOSIS — R531 Weakness: Secondary | ICD-10-CM | POA: Diagnosis not present

## 2017-10-08 DIAGNOSIS — Z7982 Long term (current) use of aspirin: Secondary | ICD-10-CM | POA: Insufficient documentation

## 2017-10-08 DIAGNOSIS — R05 Cough: Secondary | ICD-10-CM | POA: Diagnosis not present

## 2017-10-08 DIAGNOSIS — N183 Chronic kidney disease, stage 3 (moderate): Secondary | ICD-10-CM | POA: Diagnosis not present

## 2017-10-08 DIAGNOSIS — F0390 Unspecified dementia without behavioral disturbance: Secondary | ICD-10-CM | POA: Insufficient documentation

## 2017-10-08 DIAGNOSIS — M6281 Muscle weakness (generalized): Secondary | ICD-10-CM | POA: Insufficient documentation

## 2017-10-08 DIAGNOSIS — Z87891 Personal history of nicotine dependence: Secondary | ICD-10-CM | POA: Diagnosis not present

## 2017-10-08 DIAGNOSIS — R930 Abnormal findings on diagnostic imaging of skull and head, not elsewhere classified: Secondary | ICD-10-CM | POA: Diagnosis not present

## 2017-10-08 DIAGNOSIS — R269 Unspecified abnormalities of gait and mobility: Secondary | ICD-10-CM

## 2017-10-08 LAB — DIFFERENTIAL
BASOS PCT: 1 %
Basophils Absolute: 0.1 10*3/uL (ref 0.0–0.1)
EOS ABS: 0.1 10*3/uL (ref 0.0–0.7)
EOS PCT: 1 %
LYMPHS PCT: 20 %
Lymphs Abs: 1.4 10*3/uL (ref 0.7–4.0)
Monocytes Absolute: 0.6 10*3/uL (ref 0.1–1.0)
Monocytes Relative: 9 %
NEUTROS ABS: 4.9 10*3/uL (ref 1.7–7.7)
Neutrophils Relative %: 69 %
SMEAR REVIEW: ADEQUATE

## 2017-10-08 LAB — I-STAT CHEM 8, ED
BUN: 21 mg/dL — ABNORMAL HIGH (ref 6–20)
CALCIUM ION: 1.21 mmol/L (ref 1.15–1.40)
CREATININE: 1.5 mg/dL — AB (ref 0.61–1.24)
Chloride: 109 mmol/L (ref 101–111)
GLUCOSE: 104 mg/dL — AB (ref 65–99)
HCT: 43 % (ref 39.0–52.0)
HEMOGLOBIN: 14.6 g/dL (ref 13.0–17.0)
POTASSIUM: 4.2 mmol/L (ref 3.5–5.1)
Sodium: 143 mmol/L (ref 135–145)
TCO2: 25 mmol/L (ref 22–32)

## 2017-10-08 LAB — CBC
HEMATOCRIT: 41.6 % (ref 39.0–52.0)
Hemoglobin: 14.9 g/dL (ref 13.0–17.0)
MCH: 31.4 pg (ref 26.0–34.0)
MCHC: 35.8 g/dL (ref 30.0–36.0)
MCV: 87.6 fL (ref 78.0–100.0)
PLATELETS: 117 10*3/uL — AB (ref 150–400)
RBC: 4.75 MIL/uL (ref 4.22–5.81)
RDW: 13.5 % (ref 11.5–15.5)
WBC: 7.1 10*3/uL (ref 4.0–10.5)

## 2017-10-08 LAB — URINALYSIS, ROUTINE W REFLEX MICROSCOPIC
BILIRUBIN URINE: NEGATIVE
Bacteria, UA: NONE SEEN
Glucose, UA: NEGATIVE mg/dL
Hgb urine dipstick: NEGATIVE
Ketones, ur: NEGATIVE mg/dL
Nitrite: NEGATIVE
PH: 5 (ref 5.0–8.0)
Protein, ur: NEGATIVE mg/dL
SPECIFIC GRAVITY, URINE: 1.014 (ref 1.005–1.030)
SQUAMOUS EPITHELIAL / LPF: NONE SEEN

## 2017-10-08 LAB — COMPREHENSIVE METABOLIC PANEL
ALK PHOS: 98 U/L (ref 38–126)
ALT: 13 U/L — AB (ref 17–63)
AST: 19 U/L (ref 15–41)
Albumin: 3.9 g/dL (ref 3.5–5.0)
Anion gap: 8 (ref 5–15)
BILIRUBIN TOTAL: 0.8 mg/dL (ref 0.3–1.2)
BUN: 19 mg/dL (ref 6–20)
CALCIUM: 9.1 mg/dL (ref 8.9–10.3)
CHLORIDE: 107 mmol/L (ref 101–111)
CO2: 25 mmol/L (ref 22–32)
CREATININE: 1.51 mg/dL — AB (ref 0.61–1.24)
GFR calc Af Amer: 49 mL/min — ABNORMAL LOW (ref >60)
GFR, EST NON AFRICAN AMERICAN: 42 mL/min — AB (ref >60)
Glucose, Bld: 111 mg/dL — ABNORMAL HIGH (ref 65–99)
Potassium: 4.2 mmol/L (ref 3.5–5.1)
Sodium: 140 mmol/L (ref 135–145)
TOTAL PROTEIN: 7 g/dL (ref 6.5–8.1)

## 2017-10-08 LAB — I-STAT TROPONIN, ED: TROPONIN I, POC: 0.01 ng/mL (ref 0.00–0.08)

## 2017-10-08 LAB — APTT: aPTT: 32 seconds (ref 24–36)

## 2017-10-08 LAB — PROTIME-INR
INR: 1.07
PROTHROMBIN TIME: 13.8 s (ref 11.4–15.2)

## 2017-10-08 MED ORDER — CEFTRIAXONE SODIUM 1 G IJ SOLR
1.0000 g | Freq: Once | INTRAMUSCULAR | Status: AC
  Administered 2017-10-08: 1 g via INTRAMUSCULAR
  Filled 2017-10-08: qty 10

## 2017-10-08 MED ORDER — CEPHALEXIN 500 MG PO CAPS: 500.0000 mg | ORAL_CAPSULE | Freq: Three times a day (TID) | ORAL | 0 refills | Status: AC

## 2017-10-08 MED ORDER — STERILE WATER FOR INJECTION IJ SOLN
INTRAMUSCULAR | Status: AC
  Administered 2017-10-08: 10 mL
  Filled 2017-10-08: qty 10

## 2017-10-08 NOTE — ED Triage Notes (Signed)
Patient brought in by wife for stroke-like symptoms - patient has had increasing weakness in lower extremities - pt states, "it's more my right leg than my left," leading to increased falls over the past few weeks. Patient was diagnosed with Parkinson's in 2015, hx TIAs. Patient's wife more concerned d/t increased confusion, which has been going on for a while. No acute changes, patient A&O x 2. Ambulatory with steady gait using walker.

## 2017-10-08 NOTE — Telephone Encounter (Signed)
Pts wife calling stating that last night the pt was having a very hard time walking and today he has been every confused. Pts wife wanting to know if Dr. Jaynee Eagles thinks he should get a new MRI stating the last one he had was in May of 2015. Please call to advise

## 2017-10-08 NOTE — Discharge Instructions (Signed)
As we discussed, I suspect your symptoms today are due to a urine infection.  When you have Parkinson's disease, and potentially early mild dementia related to this, any illness or infection can make you feel weak.  It is very important that you take the antibiotic as prescribed.  Make sure you eat and drink a normal diet.  I would recommend at least 6-8 glasses of water per day to stay hydrated.  I also like you to follow-up with your doctor early next week for repeat exam.  If you develop any worsening fever, confusion, inability or increased difficulty walking, nausea, or other concerning symptoms, return to the ER for likely admission.

## 2017-10-08 NOTE — ED Notes (Signed)
Pt returned to room  

## 2017-10-08 NOTE — ED Provider Notes (Signed)
[facility] EMERGENCY DEPARTMENT Provider Note   CSN: [id number] Arrival date & time: 10/08/17  1416     History   Chief Complaint Chief Complaint  Patient presents with  . Stroke Symptoms    HPI Darryl [name] is a 83 y.o. male.  HPI 84 year old male with extensive past medical history as below here with generalized weakness.  The patient has a history of Parkinson's and dementia so history is somewhat limited by him.  According to the patient's wife, the patient has had some increasing confusion over the last week.  He has had progressive decline in his mental status over the last several months, but this seems to have been slightly worsened over the last several days.  He is also had increased weakness.  He complained of weakness in his legs throughout the day yesterday.  No focal weakness.  No headache.  No chest pain or shortness of breath.  Patient has been eating and drinking normally.  No recent medication changes.  Of note, the patient does endorse some intermittent urinary retention due to a history of prostate problems.  Denies any abdominal pain.  No other medical complaints.  Level 5 caveat invoked as remainder of history, ROS, and physical exam limited due to patient's dementia/Parkinson's.   Past Medical History:  Diagnosis Date  . Arthritis   . BPH (benign prostatic hypertrophy) with urinary retention   . Chronic diastolic CHF (congestive heart failure) ([place])   . Colon polyp   . Coronary atherosclerosis of native coronary artery    a. s/p CABG 11/2003. b. s/p DES to radial-OM and SVG-RCA 11/05. c. s/p BMS to LCx 11/2008.  . Dilated aortic root ([place]) 05/29/2014   42mm by echo 04/2017  . Diverticulosis   . Dyslipidemia    statin intolerant  . Elevated fasting glucose   . Gastritis    a. by EGD 04/2004.  . H/O urinary retention    post surgical procedures; has foley cath  . Headache(784.0)   . Hiatal hernia   . HOH (hard of hearing)    slight hoh, rt>l  . HTN (hypertension)   . Hypertensive heart disease   . Insomnia   . Intracranial hemorrhage ([place])    a. small pontine [place] 2003.  [name] LBBB (left bundle branch block)   . Lumbar spondylolysis    multilevel DJD with mild spinal stenosis L3-L5  . Migraines   . Parkinson's disease ([place])   . Peptic ulcer disease    a. prior hx of bleeding ulcers, including 12/2003 in setting of aspirin use.  . Renal insufficiency    renal duplex scan in 2012 showed no renal artery stenosis  . Sleep disturbance   . Stroke [facility])    a. remote hx of stroke reportedly r/t HTN.  [name] Thrombocytopenia ([place])   . Tremor   . UTI (urinary tract infection)   . Wears glasses     Patient Active Problem List   Diagnosis Date Noted  . B12 deficiency 07/13/2016  . Orthostatic hypotension 03/26/2016  . Chronic diastolic heart failure ([place]) 12/02/2015  . CAD (coronary artery disease), native coronary artery 11/28/2014  . BPH (benign prostatic hypertrophy) with urinary retention 11/19/2014  . Diastolic dysfunction 96/78/9381  . UTI (urinary tract infection) 10/07/2014  . Abnormal EKG 10/04/2014  . Urinary retention 10/04/2014  . BPH (benign prostatic hypertrophy) 10/04/2014  . UTI (lower urinary tract infection) 10/03/2014  . Sepsis ([place]) 10/03/2014  . Parkinsonism ([place]) 06/12/2014  .  Dilated aortic root ([place]) 05/29/2014  . CKD (chronic kidney disease), stage III ([place]) 04/25/2014  . Gastritis   . Peptic ulcer disease   . Stroke ([place])   . Hx of CABG 2005, PCI 2010   . Parkinson's disease ([place])   . Small vessel disease, cerebrovascular 10/30/2013  . Intracerebral hemorrhage ([place]) 10/30/2013  . Tremor 08/14/2013  . Benign essential HTN 04/13/2013  . Dyslipidemia 04/13/2013  . LBBB (left bundle branch block) 04/13/2013  . Preoperative clearance 04/13/2013  . DJD (degenerative joint disease) of lumbar spine 04/13/2013    Past Surgical History:  Procedure Laterality Date  . APPENDECTOMY  62    1963  . CHOLECYSTECTOMY  1998  . COLON SURGERY  03/14/2008  . COLONOSCOPY  10/2001  . CORONARY ANGIOPLASTY  6/10,10/05   Dr. [name] cardiologist: last office visit 11/07/14  . CORONARY ARTERY BYPASS GRAFT  2005  . CYSTOSCOPY WITH LITHOLAPAXY N/A 11/19/2014   Procedure: CYSTOSCOPY WITH LITHOLAPAXY;  Surgeon: [name], MD;  [facility];  Service: Urology;  Laterality: N/A;  . ESOPHAGOGASTRODUODENOSCOPY     10/2001, 12/2003, 04/2004  . LUMBAR LAMINECTOMY/DECOMPRESSION MICRODISCECTOMY N/A 05/03/2013   Procedure: LUMBAR LAMINECTOMY/DCOMPRESSION MICRODISCECTOMY RIGHT LUMBAR FOUR FIVE;  Surgeon: [name], MD;  Location: [facility];  Service: Neurosurgery;  Laterality: N/A;  . SHOULDER ARTHROSCOPY Left 9/10  . stones     hx  . TRANSURETHRAL RESECTION OF PROSTATE N/A 11/19/2014   Procedure: TRANSURETHRAL RESECTION OF THE PROSTATE WITH GYRUS INSTRUMENTS;  Surgeon: [name], MD;  Location: [facility];  Service: Urology;  Laterality: N/A;  . WISDOM TOOTH EXTRACTION          Home Medications    Prior to Admission medications   Medication Sig Start Date End Date Taking? Authorizing Provider  acetaminophen (TYLENOL) 500 MG tablet Take 1,000 mg by mouth every 8 (eight) hours as needed for mild pain or moderate pain.   Yes [provider]  aspirin EC 81 MG tablet Take 81 mg by mouth. Patient takes 3 times a week   Yes [provider]  carvedilol (COREG) 6.25 MG tablet Take 1 tablet (6.25 mg total) by mouth 2 (two) times daily. 08/31/17  Yes Turner, Eber Hong, MD  citalopram (CELEXA) 20 MG tablet Take 20 mg by mouth at bedtime.    Yes [provider]  cloNIDine (CATAPRES) 0.2 MG tablet Take 0.2-0.3 mg by mouth 2 (two) times daily. Takes 1.5 tablet in am and 1.5 tablets in PM daily   Yes [provider]  furosemide (LASIX) 20 MG tablet Take 1 tablet (20 mg total) by mouth daily. 08/31/17 08/26/18 Yes Turner, Eber Hong,  MD  gabapentin (NEURONTIN) 100 MG capsule Take 100 mg by mouth at bedtime.  10/10/16  Yes [provider]  hydrALAZINE (APRESOLINE) 25 MG tablet Take 1 tablet (25 mg total) by mouth 3 (three) times daily. 08/31/17 08/26/18 Yes Turner, Eber Hong, MD  irbesartan (AVAPRO) 300 MG tablet Take 300 mg by mouth at bedtime.    Yes [provider]  KLOR-CON 10 10 MEQ tablet Take 1 tablet (10 mEq total) by mouth daily. Patient taking differently: Take 10 mEq by mouth 3 (three) times a week.  08/31/17 08/26/18 Yes Turner, Eber Hong, MD  nitroGLYCERIN (NITROSTAT) 0.4 MG SL tablet Place 1 tablet (0.4 mg total) under the tongue every 5 (five) minutes as needed for chest pain. 08/31/17  Yes Sueanne Margarita, MD  pantoprazole ([place])  40 MG tablet Take 40 mg by mouth 3 (three) times a week.    Yes [provider]  traMADol (ULTRAM) 50 MG tablet Take 50 mg by mouth daily as needed (pain).    Yes [provider]  VITAMIN E PO Take 500 mg by mouth at bedtime.    Yes [provider]  cephALEXin (KEFLEX) 500 MG capsule Take 1 capsule (500 mg total) by mouth 3 (three) times daily for 7 days. 10/08/17 10/15/17  Duffy Bruce, MD    Family History Family History  Problem Relation Age of Onset  . CAD Father 14       ASCAD  . CAD Sister     Social History Social History   Tobacco Use  . Smoking status: Former Smoker    Packs/day: 2.00    Years: 5.00    Pack years: 10.00    Types: Cigarettes    Last attempt to quit: 04/28/1959    Years since quitting: 58.4  . Smokeless tobacco: Never Used  . Tobacco comment: 55+ yrs ago  Substance Use Topics  . Alcohol use: No  . Drug use: No     Allergies   Ambien [zolpidem tartrate]; Aspirin; Celebrex [celecoxib]; Nsaids; Toprol xl [metoprolol tartrate]; Altace [ramipril]; Amlodipine; Levothyroxine; Biaxin [clarithromycin]; and Tetracyclines & related   Review of Systems Review of Systems  Constitutional: Positive for fatigue.  Negative for chills and fever.  HENT: Negative for congestion and rhinorrhea.   Eyes: Negative for visual disturbance.  Respiratory: Negative for cough, shortness of breath and wheezing.   Cardiovascular: Negative for chest pain and leg swelling.  Gastrointestinal: Negative for abdominal pain, diarrhea, nausea and vomiting.  Genitourinary: Negative for dysuria and flank pain.  Musculoskeletal: Positive for gait problem. Negative for neck pain and neck stiffness.  Skin: Negative for rash and wound.  Allergic/Immunologic: Negative for immunocompromised state.  Neurological: Positive for weakness. Negative for syncope and headaches.  All other systems reviewed and are negative.    Physical Exam Updated Vital Signs BP (!) 167/94   Pulse 61   Temp 98.5 F (36.9 C)   Resp (!) 22   SpO2 98%   Physical Exam  Constitutional: He is oriented to person, place, and time. He appears well-developed and well-nourished. No distress.  HENT:  Head: Normocephalic and atraumatic.  Eyes: Conjunctivae are normal.  Neck: Neck supple.  Cardiovascular: Normal rate, regular rhythm and normal heart sounds. Exam reveals no friction rub.  No murmur heard. Pulmonary/Chest: Effort normal and breath sounds normal. No respiratory distress. He has no wheezes. He has no rales.  Abdominal: He exhibits no distension.  Musculoskeletal: He exhibits no edema.  Neurological: He is alert and oriented to person, place, and time. He exhibits normal muscle tone.  Skin: Skin is warm. Capillary refill takes less than 2 seconds.  Psychiatric: He has a normal mood and affect.  Flat affect  Nursing note and vitals reviewed.   Neurological Exam:  Mental Status: Alert and oriented to person, place, occasional difficulty remembering time. Attention and concentration normal. Speech clear but slightly slowed. Recent memory is intact. Cranial Nerves: Visual fields grossly intact. EOMI and PERRLA. No nystagmus noted. Facial  sensation intact at forehead, maxillary cheek, and chin/mandible bilaterally. No facial asymmetry or weakness. Hearing grossly normal. Uvula is midline, and palate elevates symmetrically. Normal SCM and trapezius strength. Tongue midline without fasciculations. Motor: Muscle strength 5/5 in proximal and distal UE and LE bilaterally. No pronator drift. Muscle tone normal. Reflexes:  2+ and symmetrical in all four extremities.  Sensation: Intact to light touch in upper and lower extremities distally bilaterally.  Gait: Normal without ataxia. Coordination: Normal FTN bilaterally.    ED Treatments / Results  Labs (all labs ordered are listed, but only abnormal results are displayed) Labs Reviewed  CBC - Abnormal; Notable for the following components:      Result Value   Platelets 117 (*)    All other components within normal limits  COMPREHENSIVE METABOLIC PANEL - Abnormal; Notable for the following components:   Glucose, Bld 111 (*)    Creatinine, Ser 1.51 (*)    ALT 13 (*)    GFR calc non Af Amer 42 (*)    GFR calc Af Amer 49 (*)    All other components within normal limits  URINALYSIS, ROUTINE W REFLEX MICROSCOPIC - Abnormal; Notable for the following components:   Leukocytes, UA SMALL (*)    All other components within normal limits  I-STAT CHEM 8, ED - Abnormal; Notable for the following components:   BUN 21 (*)    Creatinine, Ser 1.50 (*)    Glucose, Bld 104 (*)    All other components within normal limits  URINE CULTURE  PROTIME-INR  APTT  DIFFERENTIAL  I-STAT TROPONIN, ED  CBG MONITORING, ED    EKG EKG Interpretation  Date/Time:  Friday October 08 2017 14:58:09 EDT Ventricular Rate:  65 PR Interval:  188 QRS Duration: 140 QT Interval:  454 QTC Calculation: 472 R Axis:   -10 Text Interpretation:  Normal sinus rhythm Left bundle branch block Abnormal ECG No significant change since last tracing Confirmed by Duffy Bruce [phone]) on 10/08/2017 7:03:39  PM   Radiology Dg Chest 2 View  Result Date: 10/08/2017 CLINICAL DATA:  Cough EXAM: CHEST - 2 VIEW COMPARISON:  04/19/2015 FINDINGS: Status post CABG. Stable cardiac and mediastinal contours with aortic atherosclerosis. Clear lungs without acute pulmonary consolidation or effusion. Colonic interposition is redemonstrated over the liver shadow. The patient is status post cholecystectomy. No acute osseous abnormality. No pneumothorax. IMPRESSION: No active cardiopulmonary disease.  Aortic atherosclerosis. Electronically Signed   By: Ashley Royalty M.D.   On: 10/08/2017 19:47   Ct Head Wo Contrast  Result Date: 10/08/2017 CLINICAL DATA:  Increasing weakness in the lower extremities leading to increased falls history of Parkinson's EXAM: CT HEAD WITHOUT CONTRAST TECHNIQUE: Contiguous axial images were obtained from the base of the skull through the vertex without intravenous contrast. COMPARISON:  MRI 08/26/2013, CT brain 11/30/2003 FINDINGS: Brain: No acute territorial infarction, hemorrhage, or intracranial mass is visualized. Extensive small vessel ischemic changes of the white matter. Multifocal hypodensity in the basal ganglia and thalamus, probable remote lacunar infarcts. Moderate atrophy. Prominent ventricles likely due to atrophy. Vascular: No hyperdense vessels.  Carotid vascular calcification Skull: No fracture Sinuses/Orbits: Mild mucosal thickening in the ethmoid sinuses. No acute orbital abnormality. Other: None IMPRESSION: 1. No definite CT evidence for acute intracranial abnormality. 2. Fairly extensive small vessel ischemic changes of the white matter with probable old multifocal lacunar infarcts in the basal ganglia and thalamus. Electronically Signed   By: Donavan Foil M.D.   On: 10/08/2017 16:28    Procedures Procedures (including critical care time)  Medications Ordered in ED Medications  cefTRIAXone (ROCEPHIN) injection 1 g (1 g Intramuscular Given 10/08/17 2041)  sterile water  (preservative free) injection (10 mLs  Given 10/08/17 2041)     Initial Impression / Assessment and Plan / ED Course  I have  reviewed the triage vital signs and the nursing notes.  Pertinent labs & imaging results that were available during my care of the patient were reviewed by me and considered in my medical decision making (see chart for details).     83 year old male here with generalized weakness and slight increase in confusion.  History and exam is consistent with likely medical etiology for mild encephalopathy.  However, today, he appears to be at his neurological baseline according to his wife.  He has been amatory in the ED with out significant difficulty.  CT head and full lab work obtained in triage and is unremarkable.  He has no focal neurological deficits to suggest stroke and his history is not consistent with this.  Of note, I have also checked a urinalysis which shows 6-30 white blood cells which in the setting of his urinary symptoms and history of retention, is likely consistent with UTI.  I suspect this is the etiology for his mild acute on chronic change in mental status.  He appears to be alert and awake in the ED here.  I had a long discussion with the patient is wife.  They would like to attempt outpatient management.  Given his otherwise well appearance and normal labs, feels is reasonable.  Patient was given a dose of IM Rocephin here and will send home with Keflex and outpatient follow-up.  Urine culture is been sent.  This note was prepared with assistance of Systems analyst. Occasional wrong-word or sound-a-like substitutions may have occurred due to the inherent limitations of voice recognition software.   Final Clinical Impressions(s) / ED Diagnoses   Final diagnoses:  Generalized weakness  Lower urinary tract infection    ED Discharge Orders        Ordered    cephALEXin (KEFLEX) 500 MG capsule  3 times daily     10/08/17 2038         Duffy Bruce, MD 10/08/17 2350

## 2017-10-08 NOTE — ED Notes (Signed)
Patient transported to X-ray 

## 2017-10-08 NOTE — Telephone Encounter (Signed)
I spoke with patient's spouse (dpr on file). She states patient's gait is unsteady and he seems a little confused. Patient has a history of parkinson's disease. She states neurology office is closed today and patient PCP is off today. I advised her to follow up with PA or NP at primary MD for further recommendations. She verbalized understanding and thankful for the call.

## 2017-10-08 NOTE — Telephone Encounter (Signed)
Called pt's wife [name]. Advised that patient go to nearest ED. Suggested even having EMS pick up patient. She verbalized understanding and said she was thinking ED too. She stated that when she got home he told her  He was confused. He has had trouble walking over the last 2 days. She will proceed with getting pt to go to nearest ED. She verbalized appreciation for the call.

## 2017-10-08 NOTE — Telephone Encounter (Signed)
New Message   Patient wife is calling on behalf of his spouse. She states that he is not walking well and is a bit confused. She is wanting some advise. Please call to discuss.

## 2017-10-10 LAB — URINE CULTURE: SPECIAL REQUESTS: NORMAL

## 2017-10-11 ENCOUNTER — Telehealth: Payer: Self-pay | Admitting: *Deleted

## 2017-10-11 NOTE — Telephone Encounter (Signed)
Post ED Visit - Positive Culture Follow-up  Culture report reviewed by antimicrobial stewardship pharmacist:  []  Elenor Quinones, Pharm.D. []  Heide Guile, Pharm.D., BCPS AQ-ID []  Parks Neptune, Pharm.D., BCPS []  Alycia Rossetti, Pharm.D., BCPS []  [place], Pharm.D., BCPS, AAHIVP [x]  Legrand Como, Pharm.D., BCPS, AAHIVP []  Salome Arnt, PharmD, BCPS []  Jalene Mullet, PharmD []  Vincenza Hews, PharmD, BCPS  Positive urine culture Treated with Cephalexin, organism sensitive to the same and no further patient follow-up is required at this time.  Harlon Flor [facility] 10/11/2017, 10:18 AM

## 2017-10-12 DIAGNOSIS — Z6827 Body mass index (BMI) 27.0-27.9, adult: Secondary | ICD-10-CM | POA: Diagnosis not present

## 2017-10-12 DIAGNOSIS — R404 Transient alteration of awareness: Secondary | ICD-10-CM | POA: Diagnosis not present

## 2017-10-12 DIAGNOSIS — N3000 Acute cystitis without hematuria: Secondary | ICD-10-CM | POA: Diagnosis not present

## 2017-10-19 NOTE — Telephone Encounter (Signed)
Pts wife called requesting a call to discuss results from the ED, also to go over a couple of question in regards to PT please call to advise

## 2017-10-20 NOTE — Telephone Encounter (Addendum)
Spoke with pt's wife. She wanted Dr. Jaynee Eagles to take a look at the ED notes from pt's visit on 4/12. He had a UTI and was given an antibiotic. She also stated that the MD mentioned delirium and dementia. At the ED the patient didn't even know the date. RN discussed that infections like UTI can cause confusion and irritability. She would like to discuss the possible dementia when Dr. Jaynee Eagles comes back in office. She is aware that Dr. Jaynee Eagles will be back next week. Offered wife if needed could d/w work-in MD today if more immediate concerns. She declined and said to wait until Dr. Jaynee Eagles returns but she will call back in the meantime if needed. Pt is doing better. He has finished his antibiotic course, but the wife wanted to know if he would need his urine checked again. I advised she can check with PCP for this. He has a done a little better with walking. She now would like to proceed with the home health PT, one who deals with Parkinson's. Pt was referred in January but he declined. RN informed pt's wife that she would send message to Hinton Dyer to get this started again. She verbalized appreciation.

## 2017-10-21 ENCOUNTER — Telehealth: Payer: Self-pay | Admitting: Neurology

## 2017-10-21 NOTE — Telephone Encounter (Signed)
Order sent.

## 2017-10-21 NOTE — Addendum Note (Signed)
Addended by: Kathrynn Ducking on: 10/21/2017 04:57 PM   Modules accepted: Orders

## 2017-10-21 NOTE — Telephone Encounter (Signed)
Darryl [name] will you place a new order for home health see order from 2018 . Darryl [name] needs new order to continue service. Thanks dana

## 2017-10-21 NOTE — Telephone Encounter (Signed)
I have placed an order for physical therapy with home health.

## 2017-10-21 NOTE — Telephone Encounter (Signed)
I called the wife, left a message.  I will try to get home health physical therapy set up.  In the past, he has declined home health therapy.

## 2017-10-22 NOTE — Telephone Encounter (Signed)
Attempted to call wife. LVM asking for call back. Left office number and hours in message.

## 2017-10-22 NOTE — Telephone Encounter (Signed)
I agree he likely does but they have not been open to discussion about this at any appointment and have declined multiple requests for evaluation. I have brought this up with them and they have declined any memory issues and were not interested in pursuing it. This is not new. Pleaselet them know this I have inquired on this with them. Schedule them with a follo wup with Megan please it may not be until September then thanks

## 2017-10-25 NOTE — Telephone Encounter (Signed)
Called and spoke with pt's wife Darryl [name]. Discussed that Dr. Jaynee Eagles brought memory up in the past and it was not further discussed. However if they have changed their mind we can schedule pt with the NP. The patient's wife wants to speak with the pt first as she said she thinks he is feeling better, has "good days and bad days" with parkinson's and did not recall the ED saying he had delirium and dementia. She will d/w him and call back if they want to be seen by the NP.

## 2017-10-25 NOTE — Telephone Encounter (Signed)
Called [name] PT back and LVM asking for call back.

## 2017-10-25 NOTE — Telephone Encounter (Signed)
[place] has called re: pt starter Care appointment, she said wife has asked appointment be rescheduled for tomorrow.  PT Sonia Baller is asking for approval to do starter care tomorrow.  Please call

## 2017-10-26 DIAGNOSIS — F0280 Dementia in other diseases classified elsewhere without behavioral disturbance: Secondary | ICD-10-CM | POA: Diagnosis not present

## 2017-10-26 DIAGNOSIS — Z9181 History of falling: Secondary | ICD-10-CM | POA: Diagnosis not present

## 2017-10-26 DIAGNOSIS — M48061 Spinal stenosis, lumbar region without neurogenic claudication: Secondary | ICD-10-CM | POA: Diagnosis not present

## 2017-10-26 DIAGNOSIS — Z951 Presence of aortocoronary bypass graft: Secondary | ICD-10-CM | POA: Diagnosis not present

## 2017-10-26 DIAGNOSIS — N183 Chronic kidney disease, stage 3 (moderate): Secondary | ICD-10-CM | POA: Diagnosis not present

## 2017-10-26 DIAGNOSIS — F17211 Nicotine dependence, cigarettes, in remission: Secondary | ICD-10-CM | POA: Diagnosis not present

## 2017-10-26 DIAGNOSIS — G20 Parkinson's disease: Secondary | ICD-10-CM | POA: Diagnosis not present

## 2017-10-26 DIAGNOSIS — I1310 Hypertensive heart and chronic kidney disease without heart failure, with stage 1 through stage 4 chronic kidney disease, or unspecified chronic kidney disease: Secondary | ICD-10-CM | POA: Diagnosis not present

## 2017-10-26 DIAGNOSIS — Z87440 Personal history of urinary (tract) infections: Secondary | ICD-10-CM | POA: Diagnosis not present

## 2017-10-26 DIAGNOSIS — I2510 Atherosclerotic heart disease of native coronary artery without angina pectoris: Secondary | ICD-10-CM | POA: Diagnosis not present

## 2017-10-26 DIAGNOSIS — I5022 Chronic systolic (congestive) heart failure: Secondary | ICD-10-CM | POA: Diagnosis not present

## 2017-10-26 DIAGNOSIS — R338 Other retention of urine: Secondary | ICD-10-CM | POA: Diagnosis not present

## 2017-10-26 DIAGNOSIS — N401 Enlarged prostate with lower urinary tract symptoms: Secondary | ICD-10-CM | POA: Diagnosis not present

## 2017-10-26 DIAGNOSIS — M5410 Radiculopathy, site unspecified: Secondary | ICD-10-CM | POA: Diagnosis not present

## 2017-10-27 ENCOUNTER — Other Ambulatory Visit: Payer: Self-pay | Admitting: *Deleted

## 2017-10-27 MED ORDER — KLOR-CON 10 10 MEQ PO TBCR: 10.0000 meq | EXTENDED_RELEASE_TABLET | Freq: Every day | ORAL | 3 refills | Status: DC

## 2017-10-27 NOTE — Telephone Encounter (Signed)
Patients wife called and stated that the rx for klor con was [facility] to be [facility] to [facility] but [facility]. I clarified dose with her and she states that the patient takes 10 meq qd. She is aware that I will send this to [facility].

## 2017-10-28 DIAGNOSIS — R410 Disorientation, unspecified: Secondary | ICD-10-CM | POA: Diagnosis not present

## 2017-11-01 DIAGNOSIS — F0280 Dementia in other diseases classified elsewhere without behavioral disturbance: Secondary | ICD-10-CM | POA: Diagnosis not present

## 2017-11-01 DIAGNOSIS — M5410 Radiculopathy, site unspecified: Secondary | ICD-10-CM | POA: Diagnosis not present

## 2017-11-01 DIAGNOSIS — I2510 Atherosclerotic heart disease of native coronary artery without angina pectoris: Secondary | ICD-10-CM | POA: Diagnosis not present

## 2017-11-01 DIAGNOSIS — I1310 Hypertensive heart and chronic kidney disease without heart failure, with stage 1 through stage 4 chronic kidney disease, or unspecified chronic kidney disease: Secondary | ICD-10-CM | POA: Diagnosis not present

## 2017-11-01 DIAGNOSIS — G20 Parkinson's disease: Secondary | ICD-10-CM | POA: Diagnosis not present

## 2017-11-01 DIAGNOSIS — M48061 Spinal stenosis, lumbar region without neurogenic claudication: Secondary | ICD-10-CM | POA: Diagnosis not present

## 2017-11-02 DIAGNOSIS — M961 Postlaminectomy syndrome, not elsewhere classified: Secondary | ICD-10-CM | POA: Diagnosis not present

## 2017-11-02 DIAGNOSIS — M4727 Other spondylosis with radiculopathy, lumbosacral region: Secondary | ICD-10-CM | POA: Diagnosis not present

## 2017-11-02 DIAGNOSIS — M5416 Radiculopathy, lumbar region: Secondary | ICD-10-CM | POA: Diagnosis not present

## 2017-11-02 DIAGNOSIS — M47817 Spondylosis without myelopathy or radiculopathy, lumbosacral region: Secondary | ICD-10-CM | POA: Diagnosis not present

## 2017-11-02 DIAGNOSIS — M7061 Trochanteric bursitis, right hip: Secondary | ICD-10-CM | POA: Diagnosis not present

## 2017-11-02 DIAGNOSIS — Z79899 Other long term (current) drug therapy: Secondary | ICD-10-CM | POA: Diagnosis not present

## 2017-11-02 DIAGNOSIS — M5116 Intervertebral disc disorders with radiculopathy, lumbar region: Secondary | ICD-10-CM | POA: Diagnosis not present

## 2017-11-03 DIAGNOSIS — I2510 Atherosclerotic heart disease of native coronary artery without angina pectoris: Secondary | ICD-10-CM | POA: Diagnosis not present

## 2017-11-03 DIAGNOSIS — G20 Parkinson's disease: Secondary | ICD-10-CM | POA: Diagnosis not present

## 2017-11-03 DIAGNOSIS — M48061 Spinal stenosis, lumbar region without neurogenic claudication: Secondary | ICD-10-CM | POA: Diagnosis not present

## 2017-11-03 DIAGNOSIS — M5410 Radiculopathy, site unspecified: Secondary | ICD-10-CM | POA: Diagnosis not present

## 2017-11-03 DIAGNOSIS — I1310 Hypertensive heart and chronic kidney disease without heart failure, with stage 1 through stage 4 chronic kidney disease, or unspecified chronic kidney disease: Secondary | ICD-10-CM | POA: Diagnosis not present

## 2017-11-03 DIAGNOSIS — F0280 Dementia in other diseases classified elsewhere without behavioral disturbance: Secondary | ICD-10-CM | POA: Diagnosis not present

## 2017-11-08 DIAGNOSIS — F0280 Dementia in other diseases classified elsewhere without behavioral disturbance: Secondary | ICD-10-CM | POA: Diagnosis not present

## 2017-11-08 DIAGNOSIS — I2510 Atherosclerotic heart disease of native coronary artery without angina pectoris: Secondary | ICD-10-CM | POA: Diagnosis not present

## 2017-11-08 DIAGNOSIS — I1310 Hypertensive heart and chronic kidney disease without heart failure, with stage 1 through stage 4 chronic kidney disease, or unspecified chronic kidney disease: Secondary | ICD-10-CM | POA: Diagnosis not present

## 2017-11-08 DIAGNOSIS — M48061 Spinal stenosis, lumbar region without neurogenic claudication: Secondary | ICD-10-CM | POA: Diagnosis not present

## 2017-11-08 DIAGNOSIS — G20 Parkinson's disease: Secondary | ICD-10-CM | POA: Diagnosis not present

## 2017-11-08 DIAGNOSIS — M5410 Radiculopathy, site unspecified: Secondary | ICD-10-CM | POA: Diagnosis not present

## 2017-11-10 DIAGNOSIS — I1310 Hypertensive heart and chronic kidney disease without heart failure, with stage 1 through stage 4 chronic kidney disease, or unspecified chronic kidney disease: Secondary | ICD-10-CM | POA: Diagnosis not present

## 2017-11-10 DIAGNOSIS — I2510 Atherosclerotic heart disease of native coronary artery without angina pectoris: Secondary | ICD-10-CM | POA: Diagnosis not present

## 2017-11-10 DIAGNOSIS — M5410 Radiculopathy, site unspecified: Secondary | ICD-10-CM | POA: Diagnosis not present

## 2017-11-10 DIAGNOSIS — M48061 Spinal stenosis, lumbar region without neurogenic claudication: Secondary | ICD-10-CM | POA: Diagnosis not present

## 2017-11-10 DIAGNOSIS — G20 Parkinson's disease: Secondary | ICD-10-CM | POA: Diagnosis not present

## 2017-11-10 DIAGNOSIS — F0280 Dementia in other diseases classified elsewhere without behavioral disturbance: Secondary | ICD-10-CM | POA: Diagnosis not present

## 2017-11-14 ENCOUNTER — Inpatient Hospital Stay (HOSPITAL_COMMUNITY)
Admission: EM | Admit: 2017-11-14 | Discharge: 2017-11-17 | DRG: 871 | Disposition: A | Discharge status: Home Health | Payer: Medicare Other | Attending: Internal Medicine | Admitting: Internal Medicine

## 2017-11-14 ENCOUNTER — Emergency Department (HOSPITAL_COMMUNITY): Payer: Medicare Other

## 2017-11-14 ENCOUNTER — Encounter (HOSPITAL_COMMUNITY): Payer: Self-pay

## 2017-11-14 DIAGNOSIS — I10 Essential (primary) hypertension: Secondary | ICD-10-CM | POA: Diagnosis present

## 2017-11-14 DIAGNOSIS — Z888 Allergy status to other drugs, medicaments and biological substances status: Secondary | ICD-10-CM

## 2017-11-14 DIAGNOSIS — I130 Hypertensive heart and chronic kidney disease with heart failure and stage 1 through stage 4 chronic kidney disease, or unspecified chronic kidney disease: Secondary | ICD-10-CM | POA: Diagnosis not present

## 2017-11-14 DIAGNOSIS — Z79899 Other long term (current) drug therapy: Secondary | ICD-10-CM

## 2017-11-14 DIAGNOSIS — J690 Pneumonitis due to inhalation of food and vomit: Secondary | ICD-10-CM | POA: Diagnosis present

## 2017-11-14 DIAGNOSIS — I447 Left bundle-branch block, unspecified: Secondary | ICD-10-CM | POA: Diagnosis present

## 2017-11-14 DIAGNOSIS — K219 Gastro-esophageal reflux disease without esophagitis: Secondary | ICD-10-CM | POA: Diagnosis present

## 2017-11-14 DIAGNOSIS — I5189 Other ill-defined heart diseases: Secondary | ICD-10-CM

## 2017-11-14 DIAGNOSIS — I2510 Atherosclerotic heart disease of native coronary artery without angina pectoris: Secondary | ICD-10-CM | POA: Diagnosis present

## 2017-11-14 DIAGNOSIS — R911 Solitary pulmonary nodule: Secondary | ICD-10-CM | POA: Diagnosis present

## 2017-11-14 DIAGNOSIS — R531 Weakness: Secondary | ICD-10-CM

## 2017-11-14 DIAGNOSIS — Z886 Allergy status to analgesic agent status: Secondary | ICD-10-CM

## 2017-11-14 DIAGNOSIS — Z9049 Acquired absence of other specified parts of digestive tract: Secondary | ICD-10-CM

## 2017-11-14 DIAGNOSIS — Z8249 Family history of ischemic heart disease and other diseases of the circulatory system: Secondary | ICD-10-CM

## 2017-11-14 DIAGNOSIS — Z8673 Personal history of transient ischemic attack (TIA), and cerebral infarction without residual deficits: Secondary | ICD-10-CM

## 2017-11-14 DIAGNOSIS — H9190 Unspecified hearing loss, unspecified ear: Secondary | ICD-10-CM | POA: Diagnosis present

## 2017-11-14 DIAGNOSIS — G20 Parkinson's disease: Secondary | ICD-10-CM | POA: Diagnosis present

## 2017-11-14 DIAGNOSIS — Z86010 Personal history of colonic polyps: Secondary | ICD-10-CM

## 2017-11-14 DIAGNOSIS — N183 Chronic kidney disease, stage 3 unspecified: Secondary | ICD-10-CM | POA: Diagnosis present

## 2017-11-14 DIAGNOSIS — R338 Other retention of urine: Secondary | ICD-10-CM | POA: Diagnosis present

## 2017-11-14 DIAGNOSIS — I77810 Thoracic aortic ectasia: Secondary | ICD-10-CM | POA: Diagnosis present

## 2017-11-14 DIAGNOSIS — N401 Enlarged prostate with lower urinary tract symptoms: Secondary | ICD-10-CM | POA: Diagnosis present

## 2017-11-14 DIAGNOSIS — Z973 Presence of spectacles and contact lenses: Secondary | ICD-10-CM

## 2017-11-14 DIAGNOSIS — W1830XA Fall on same level, unspecified, initial encounter: Secondary | ICD-10-CM | POA: Diagnosis present

## 2017-11-14 DIAGNOSIS — Z881 Allergy status to other antibiotic agents status: Secondary | ICD-10-CM

## 2017-11-14 DIAGNOSIS — Z87891 Personal history of nicotine dependence: Secondary | ICD-10-CM

## 2017-11-14 DIAGNOSIS — A419 Sepsis, unspecified organism: Principal | ICD-10-CM | POA: Diagnosis present

## 2017-11-14 DIAGNOSIS — E785 Hyperlipidemia, unspecified: Secondary | ICD-10-CM | POA: Diagnosis present

## 2017-11-14 DIAGNOSIS — R918 Other nonspecific abnormal finding of lung field: Secondary | ICD-10-CM | POA: Diagnosis not present

## 2017-11-14 DIAGNOSIS — R451 Restlessness and agitation: Secondary | ICD-10-CM | POA: Diagnosis present

## 2017-11-14 DIAGNOSIS — S299XXA Unspecified injury of thorax, initial encounter: Secondary | ICD-10-CM | POA: Diagnosis not present

## 2017-11-14 DIAGNOSIS — E876 Hypokalemia: Secondary | ICD-10-CM | POA: Diagnosis present

## 2017-11-14 DIAGNOSIS — R509 Fever, unspecified: Secondary | ICD-10-CM

## 2017-11-14 DIAGNOSIS — Z955 Presence of coronary angioplasty implant and graft: Secondary | ICD-10-CM

## 2017-11-14 DIAGNOSIS — G4700 Insomnia, unspecified: Secondary | ICD-10-CM | POA: Diagnosis present

## 2017-11-14 DIAGNOSIS — I639 Cerebral infarction, unspecified: Secondary | ICD-10-CM | POA: Diagnosis present

## 2017-11-14 DIAGNOSIS — Z7982 Long term (current) use of aspirin: Secondary | ICD-10-CM

## 2017-11-14 DIAGNOSIS — Z79891 Long term (current) use of opiate analgesic: Secondary | ICD-10-CM

## 2017-11-14 DIAGNOSIS — Z9079 Acquired absence of other genital organ(s): Secondary | ICD-10-CM

## 2017-11-14 DIAGNOSIS — G20A1 Parkinson's disease without dyskinesia, without mention of fluctuations: Secondary | ICD-10-CM | POA: Diagnosis present

## 2017-11-14 DIAGNOSIS — I5032 Chronic diastolic (congestive) heart failure: Secondary | ICD-10-CM | POA: Diagnosis not present

## 2017-11-14 DIAGNOSIS — Z951 Presence of aortocoronary bypass graft: Secondary | ICD-10-CM

## 2017-11-14 DIAGNOSIS — R112 Nausea with vomiting, unspecified: Secondary | ICD-10-CM | POA: Diagnosis not present

## 2017-11-14 LAB — COMPREHENSIVE METABOLIC PANEL
ALBUMIN: 3.8 g/dL (ref 3.5–5.0)
ALK PHOS: 89 U/L (ref 38–126)
ALT: 20 U/L (ref 17–63)
AST: 23 U/L (ref 15–41)
Anion gap: 7 (ref 5–15)
BILIRUBIN TOTAL: 0.8 mg/dL (ref 0.3–1.2)
BUN: 18 mg/dL (ref 6–20)
CALCIUM: 9.2 mg/dL (ref 8.9–10.3)
CO2: 24 mmol/L (ref 22–32)
CREATININE: 1.43 mg/dL — AB (ref 0.61–1.24)
Chloride: 110 mmol/L (ref 101–111)
GFR calc Af Amer: 52 mL/min — ABNORMAL LOW (ref >60)
GFR calc non Af Amer: 45 mL/min — ABNORMAL LOW (ref >60)
GLUCOSE: 147 mg/dL — AB (ref 65–99)
Potassium: 4.1 mmol/L (ref 3.5–5.1)
SODIUM: 141 mmol/L (ref 135–145)
TOTAL PROTEIN: 6.6 g/dL (ref 6.5–8.1)

## 2017-11-14 LAB — CBC
HCT: 41.3 % (ref 39.0–52.0)
Hemoglobin: 14.8 g/dL (ref 13.0–17.0)
MCH: 31.4 pg (ref 26.0–34.0)
MCHC: 35.8 g/dL (ref 30.0–36.0)
MCV: 87.7 fL (ref 78.0–100.0)
PLATELETS: 129 10*3/uL — AB (ref 150–400)
RBC: 4.71 MIL/uL (ref 4.22–5.81)
RDW: 13.2 % (ref 11.5–15.5)
WBC: 16.9 10*3/uL — ABNORMAL HIGH (ref 4.0–10.5)

## 2017-11-14 LAB — URINALYSIS, ROUTINE W REFLEX MICROSCOPIC
BILIRUBIN URINE: NEGATIVE
Glucose, UA: NEGATIVE mg/dL
HGB URINE DIPSTICK: NEGATIVE
KETONES UR: 5 mg/dL — AB
Leukocytes, UA: NEGATIVE
Nitrite: NEGATIVE
PROTEIN: NEGATIVE mg/dL
SPECIFIC GRAVITY, URINE: 1.017 (ref 1.005–1.030)
pH: 6 (ref 5.0–8.0)

## 2017-11-14 LAB — I-STAT CG4 LACTIC ACID, ED: LACTIC ACID, VENOUS: 1.42 mmol/L (ref 0.5–1.9)

## 2017-11-14 MED ORDER — SODIUM CHLORIDE 0.9 % IV BOLUS
500.0000 mL | Freq: Once | INTRAVENOUS | Status: AC
  Administered 2017-11-14: 500 mL via INTRAVENOUS

## 2017-11-14 MED ORDER — SODIUM CHLORIDE 0.9 % IV SOLN
INTRAVENOUS | Status: DC
  Administered 2017-11-14 – 2017-11-16 (×2): via INTRAVENOUS

## 2017-11-14 NOTE — ED Triage Notes (Signed)
Pt. Arrived via EMS after wife found pt. At home on the floor. Pt. States that he did not fall but decided to lay down on the ground. Pt. Has a hx. Of UTI. Pt. Reports feeling lethargic. Pt. A/o x4. NAD

## 2017-11-15 ENCOUNTER — Other Ambulatory Visit: Payer: Self-pay

## 2017-11-15 ENCOUNTER — Encounter (HOSPITAL_COMMUNITY): Payer: Self-pay

## 2017-11-15 DIAGNOSIS — Z955 Presence of coronary angioplasty implant and graft: Secondary | ICD-10-CM | POA: Diagnosis not present

## 2017-11-15 DIAGNOSIS — R531 Weakness: Secondary | ICD-10-CM | POA: Diagnosis not present

## 2017-11-15 DIAGNOSIS — I130 Hypertensive heart and chronic kidney disease with heart failure and stage 1 through stage 4 chronic kidney disease, or unspecified chronic kidney disease: Secondary | ICD-10-CM | POA: Diagnosis present

## 2017-11-15 DIAGNOSIS — E876 Hypokalemia: Secondary | ICD-10-CM | POA: Diagnosis present

## 2017-11-15 DIAGNOSIS — Z951 Presence of aortocoronary bypass graft: Secondary | ICD-10-CM | POA: Diagnosis not present

## 2017-11-15 DIAGNOSIS — I2510 Atherosclerotic heart disease of native coronary artery without angina pectoris: Secondary | ICD-10-CM

## 2017-11-15 DIAGNOSIS — H9190 Unspecified hearing loss, unspecified ear: Secondary | ICD-10-CM | POA: Diagnosis present

## 2017-11-15 DIAGNOSIS — Z8673 Personal history of transient ischemic attack (TIA), and cerebral infarction without residual deficits: Secondary | ICD-10-CM | POA: Diagnosis not present

## 2017-11-15 DIAGNOSIS — Z9079 Acquired absence of other genital organ(s): Secondary | ICD-10-CM | POA: Diagnosis not present

## 2017-11-15 DIAGNOSIS — G20 Parkinson's disease: Secondary | ICD-10-CM | POA: Diagnosis not present

## 2017-11-15 DIAGNOSIS — W1830XA Fall on same level, unspecified, initial encounter: Secondary | ICD-10-CM | POA: Diagnosis present

## 2017-11-15 DIAGNOSIS — E785 Hyperlipidemia, unspecified: Secondary | ICD-10-CM | POA: Diagnosis present

## 2017-11-15 DIAGNOSIS — R911 Solitary pulmonary nodule: Secondary | ICD-10-CM | POA: Diagnosis present

## 2017-11-15 DIAGNOSIS — R338 Other retention of urine: Secondary | ICD-10-CM | POA: Diagnosis present

## 2017-11-15 DIAGNOSIS — N401 Enlarged prostate with lower urinary tract symptoms: Secondary | ICD-10-CM | POA: Diagnosis present

## 2017-11-15 DIAGNOSIS — G4700 Insomnia, unspecified: Secondary | ICD-10-CM | POA: Diagnosis present

## 2017-11-15 DIAGNOSIS — I77810 Thoracic aortic ectasia: Secondary | ICD-10-CM | POA: Diagnosis present

## 2017-11-15 DIAGNOSIS — Z9049 Acquired absence of other specified parts of digestive tract: Secondary | ICD-10-CM | POA: Diagnosis not present

## 2017-11-15 DIAGNOSIS — Z973 Presence of spectacles and contact lenses: Secondary | ICD-10-CM | POA: Diagnosis not present

## 2017-11-15 DIAGNOSIS — I10 Essential (primary) hypertension: Secondary | ICD-10-CM

## 2017-11-15 DIAGNOSIS — J690 Pneumonitis due to inhalation of food and vomit: Secondary | ICD-10-CM | POA: Diagnosis present

## 2017-11-15 DIAGNOSIS — I5189 Other ill-defined heart diseases: Secondary | ICD-10-CM | POA: Diagnosis not present

## 2017-11-15 DIAGNOSIS — R451 Restlessness and agitation: Secondary | ICD-10-CM | POA: Diagnosis present

## 2017-11-15 DIAGNOSIS — N183 Chronic kidney disease, stage 3 (moderate): Secondary | ICD-10-CM | POA: Diagnosis not present

## 2017-11-15 DIAGNOSIS — I447 Left bundle-branch block, unspecified: Secondary | ICD-10-CM | POA: Diagnosis present

## 2017-11-15 DIAGNOSIS — I5032 Chronic diastolic (congestive) heart failure: Secondary | ICD-10-CM | POA: Diagnosis present

## 2017-11-15 DIAGNOSIS — K219 Gastro-esophageal reflux disease without esophagitis: Secondary | ICD-10-CM | POA: Diagnosis present

## 2017-11-15 DIAGNOSIS — Z86010 Personal history of colonic polyps: Secondary | ICD-10-CM | POA: Diagnosis not present

## 2017-11-15 DIAGNOSIS — A419 Sepsis, unspecified organism: Secondary | ICD-10-CM | POA: Diagnosis not present

## 2017-11-15 LAB — RESPIRATORY PANEL BY PCR
Adenovirus: NOT DETECTED
Bordetella pertussis: NOT DETECTED
CHLAMYDOPHILA PNEUMONIAE-RVPPCR: NOT DETECTED
CORONAVIRUS HKU1-RVPPCR: NOT DETECTED
CORONAVIRUS OC43-RVPPCR: NOT DETECTED
Coronavirus 229E: NOT DETECTED
Coronavirus NL63: NOT DETECTED
Influenza A: NOT DETECTED
Influenza B: NOT DETECTED
MYCOPLASMA PNEUMONIAE-RVPPCR: NOT DETECTED
Metapneumovirus: NOT DETECTED
PARAINFLUENZA VIRUS 1-RVPPCR: NOT DETECTED
Parainfluenza Virus 2: NOT DETECTED
Parainfluenza Virus 3: NOT DETECTED
Parainfluenza Virus 4: NOT DETECTED
Respiratory Syncytial Virus: NOT DETECTED
Rhinovirus / Enterovirus: NOT DETECTED

## 2017-11-15 LAB — BRAIN NATRIURETIC PEPTIDE: B NATRIURETIC PEPTIDE 5: 194.5 pg/mL — AB (ref 0.0–100.0)

## 2017-11-15 LAB — PROCALCITONIN: PROCALCITONIN: 0.16 ng/mL

## 2017-11-15 LAB — STREP PNEUMONIAE URINARY ANTIGEN: Strep Pneumo Urinary Antigen: NEGATIVE

## 2017-11-15 LAB — INFLUENZA PANEL BY PCR (TYPE A & B)
INFLAPCR: NEGATIVE
Influenza B By PCR: NEGATIVE

## 2017-11-15 LAB — LACTIC ACID, PLASMA: LACTIC ACID, VENOUS: 1 mmol/L (ref 0.5–1.9)

## 2017-11-15 LAB — CBG MONITORING, ED: GLUCOSE-CAPILLARY: 149 mg/dL — AB (ref 65–99)

## 2017-11-15 MED ORDER — PIPERACILLIN-TAZOBACTAM 3.375 G IVPB 30 MIN
3.3750 g | Freq: Once | INTRAVENOUS | Status: AC
  Administered 2017-11-15: 3.375 g via INTRAVENOUS
  Filled 2017-11-15: qty 50

## 2017-11-15 MED ORDER — CLONIDINE HCL 0.3 MG PO TABS
0.3000 mg | ORAL_TABLET | Freq: Two times a day (BID) | ORAL | Status: DC
  Administered 2017-11-15 – 2017-11-17 (×6): 0.3 mg via ORAL
  Filled 2017-11-15 (×7): qty 1

## 2017-11-15 MED ORDER — ONDANSETRON HCL 4 MG/2ML IJ SOLN: 4.0000 mg | Freq: Three times a day (TID) | INTRAMUSCULAR | Status: DC | PRN

## 2017-11-15 MED ORDER — TRAMADOL HCL 50 MG PO TABS
50.0000 mg | ORAL_TABLET | Freq: Every day | ORAL | Status: DC | PRN
Start: 2017-11-15 — End: 2017-11-17

## 2017-11-15 MED ORDER — ACETAMINOPHEN 325 MG PO TABS
650.0000 mg | ORAL_TABLET | Freq: Four times a day (QID) | ORAL | Status: DC | PRN
  Administered 2017-11-15 – 2017-11-16 (×2): 650 mg via ORAL
  Filled 2017-11-15 (×2): qty 2

## 2017-11-15 MED ORDER — ENOXAPARIN SODIUM 40 MG/0.4ML ~~LOC~~ SOLN
40.0000 mg | SUBCUTANEOUS | Status: DC
  Administered 2017-11-15 – 2017-11-16 (×2): 40 mg via SUBCUTANEOUS
  Filled 2017-11-15 (×3): qty 0.4

## 2017-11-15 MED ORDER — ASPIRIN EC 81 MG PO TBEC
81.0000 mg | DELAYED_RELEASE_TABLET | Freq: Every day | ORAL | Status: DC
  Administered 2017-11-15 – 2017-11-17 (×3): 81 mg via ORAL
  Filled 2017-11-15 (×4): qty 1

## 2017-11-15 MED ORDER — CARVEDILOL 6.25 MG PO TABS
6.2500 mg | ORAL_TABLET | Freq: Two times a day (BID) | ORAL | Status: DC
  Administered 2017-11-15 – 2017-11-17 (×5): 6.25 mg via ORAL
  Filled 2017-11-15 (×7): qty 1

## 2017-11-15 MED ORDER — ALBUTEROL SULFATE (2.5 MG/3ML) 0.083% IN NEBU
2.5000 mg | INHALATION_SOLUTION | RESPIRATORY_TRACT | Status: DC | PRN
Start: 2017-11-15 — End: 2017-11-17

## 2017-11-15 MED ORDER — IRBESARTAN 300 MG PO TABS
300.0000 mg | ORAL_TABLET | Freq: Every day | ORAL | Status: DC
  Administered 2017-11-16: 300 mg via ORAL
  Filled 2017-11-15: qty 1

## 2017-11-15 MED ORDER — DM-GUAIFENESIN ER 30-600 MG PO TB12: 1.0000 | ORAL_TABLET | Freq: Two times a day (BID) | ORAL | Status: DC | PRN

## 2017-11-15 MED ORDER — PANTOPRAZOLE SODIUM 40 MG PO TBEC
40.0000 mg | DELAYED_RELEASE_TABLET | ORAL | Status: DC
  Administered 2017-11-15 – 2017-11-17 (×2): 40 mg via ORAL
  Filled 2017-11-15: qty 1

## 2017-11-15 MED ORDER — HYDRALAZINE HCL 20 MG/ML IJ SOLN
5.0000 mg | INTRAMUSCULAR | Status: DC | PRN
  Administered 2017-11-15: 5 mg via INTRAVENOUS
  Filled 2017-11-15: qty 1

## 2017-11-15 MED ORDER — NITROGLYCERIN 0.4 MG SL SUBL: 0.4000 mg | SUBLINGUAL_TABLET | SUBLINGUAL | Status: DC | PRN

## 2017-11-15 MED ORDER — CITALOPRAM HYDROBROMIDE 20 MG PO TABS
20.0000 mg | ORAL_TABLET | Freq: Every day | ORAL | Status: DC
  Administered 2017-11-15 – 2017-11-16 (×2): 20 mg via ORAL
  Filled 2017-11-15 (×2): qty 1
  Filled 2017-11-15: qty 2

## 2017-11-15 MED ORDER — PIPERACILLIN-TAZOBACTAM 3.375 G IVPB
3.3750 g | Freq: Three times a day (TID) | INTRAVENOUS | Status: DC
  Administered 2017-11-15 – 2017-11-17 (×6): 3.375 g via INTRAVENOUS
  Filled 2017-11-15 (×9): qty 50

## 2017-11-15 MED ORDER — ACETAMINOPHEN 500 MG PO TABS
1000.0000 mg | ORAL_TABLET | Freq: Once | ORAL | Status: AC
  Administered 2017-11-15: 1000 mg via ORAL
  Filled 2017-11-15: qty 2

## 2017-11-15 MED ORDER — HYDRALAZINE HCL 25 MG PO TABS
25.0000 mg | ORAL_TABLET | Freq: Three times a day (TID) | ORAL | Status: DC
  Administered 2017-11-15 – 2017-11-17 (×7): 25 mg via ORAL
  Filled 2017-11-15 (×9): qty 1

## 2017-11-15 NOTE — ED Notes (Signed)
Pt did not want this tech in room while he was using a urinal. Pt told this tech you can go now. This tech ask pt if he would like to use a walker and pt said yes. Pt was given a walker and pt was able to stand without assistance. Pt wanted a chair to sit down in to use the urinal. A chair was placed beside the bed but not in pt way. Pt sat in the chair and was given a urinal. Pt waited for this tech to leave the room. Pt was instructed to set the urinal down beside the chair when he finished. And call bell was within pt reach

## 2017-11-15 NOTE — Progress Notes (Signed)
Patient admitted with wife at bedside, patient is pleasant and has AMS at times, paged the admitted team.

## 2017-11-15 NOTE — ED Notes (Addendum)
Walked into Pt. Room with Pt. On side of bed. I asked pt. What he needed and he said "nothing, I am just sitting here like a dumbass". When asked pt. What I could do he said " I am leaving." When I explained that the pt. Was waiting for a bed upstairs, pt. Became angry and said he was leaving now. I explained to pt. That all the floor orders have been completed for him and leaving would be against medical advice. Christy Gentles, MD made aware and MD spoke with pt. MD took pt. Off the monitor to make him more comfortable. Pt. Is now settled in bed with extra blankets and call light within reach. Will continue to monitor.

## 2017-11-15 NOTE — Progress Notes (Signed)
PROGRESS NOTE  Subjective: Darryl [name] is an 83 y.o. male with a history of PD without treatment, stroke, CAD s/p CABG, chronic HFpEF, stage III CKD, GERD, HLD, depression, thrombocytopenia who presented to the ED with a few days of fatigue, weakness and found on the floor by his wife without the ability to get himself up. Diagnosed w/PD 3 years ago, not started Tx but wife notes functional decline over past year, supposed to use a walker but doesn't. Wife reported subjective tactile fever unmeasured and a few days of decreasing po intake. He denied any cardiac, respiratory, or urinary symptoms other than stable urinary urgency. Was treated for UTI last month. Tmax in ED was 100.23F, otherwise normal vital signs in ED with WBC 16.9, lactic acid normal. CXR negative and CT chest showed bibasilar linear opacities consistent with atelectasis with an outside chance of aspiration pneumonia though no endobronchial debris noted. Urine dipstick had no leukocyte esterase, no micro, sent to culture. Zosyn was started for presumed aspiration pneumonia and the patient was admitted. PT is consulted  Objective: BP (!) 188/85   Pulse (!) 55   Temp 98.1 F (36.7 C) (Oral)   Resp 19   Ht 5\' 8"  (1.727 m)   Wt 85.7 kg (189 lb)   SpO2 98%   BMI 28.74 kg/m   Gen: No distress, calm and pleasant Pulm: Clear and nonlabored on room air  CV: RRR, no murmur, no JVD, no edema GI: Soft, NT, ND, +BS  Neuro: Alert and oriented. Flattened affect.  Skin: No rashes, lesions or ulcers  Assessment & Plan: Generalized weakness: Fell and couldn't get up, not down for long. Suspect infectious illness worsening untreated parkinson's disease.  - PT/OT - Treat infection as below - Pt's wife suspects he may require rehabilitation  Fever, leukocytosis: Treating for presumed aspiration pneumonia/pneumonitis based on radiographic findings and risk for aspiration, though pt doesn't endorse consistent symptoms. Had similar  weakness, mental status change last month and had UTI treated as outpatient.  - Will watch urine culture and blood cultures closely.  - Continue empiric zosyn. PCT 0.16 not consistent with severe infection. Flu negative, RVP negative.   Parkinson's disease:  - Urged to follow up with neurology as outpatient. Has been seeing Dr. Jaynee Eagles, though they are interested in seeing Dr. Carles Collet. This was encouraged.   Agitated delirium:  - Counseled pt's wife on likelihood of this continuing with advanced age, baseline decreased cerebral reserve. Will be present as much as possible.  - Delirium precautions - May need zyprexa 2.5mg  qHS. QTc on telemetry 425msec. - Minimize restraints including catheters. DC telemetry.   HTN:  - Continue home medications, elevated possibly due to agitation - IV hydralazine prn  Chronic HFpEF: Euvolemic, BNP 194. Getting IV fluids with recent decreased po intake.  - IVF's to stop this evening, decide Re: Lasix restart in AM.  - I/O, daily weights  CAD s/p CABG 2005, PCI 2010. No anginal complaints.  - Continue aspirin, coreg, prn NTG  Stage III CKD:  - Monitor - Avoid nephrotoxins.   Lung nodule: 30mm LUL on CT. - Follow up with PCP  Patrecia Pour, MD [facility] Pager [phone] 11/15/2017, 1:50 PM

## 2017-11-15 NOTE — Progress Notes (Signed)
Patient is is confused, forgetful and guarded with care at times, walked to bathroom with stand by and walker .Refused  To have lead replaced and raised his hand and blocked from replacing it.

## 2017-11-15 NOTE — Evaluation (Signed)
Clinical/Bedside Swallow Evaluation Patient Details  Name: Darryl [name] MRN: [id number] Date of Birth: Apr 30, 1935  Today's Date: 11/15/2017 Time: SLP Start Time (ACUTE ONLY): 1439 SLP Stop Time (ACUTE ONLY): 1500 SLP Time Calculation (min) (ACUTE ONLY): 21 min  Past Medical History:  Past Medical History:  Diagnosis Date  . Arthritis   . BPH (benign prostatic hypertrophy) with urinary retention   . Chronic diastolic CHF (congestive heart failure) ([place])   . Colon polyp   . Coronary atherosclerosis of native coronary artery    a. s/p CABG 11/2003. b. s/p DES to radial-OM and SVG-RCA 11/05. c. s/p BMS to LCx 11/2008.  . Dilated aortic root ([place]) 05/29/2014   49mm by echo 04/2017  . Diverticulosis   . Dyslipidemia    statin intolerant  . Elevated fasting glucose   . Gastritis    a. by EGD 04/2004.  . H/O urinary retention    post surgical procedures; has foley cath  . Headache(784.0)   . Hiatal hernia   . HOH (hard of hearing)    slight hoh, rt>l  . HTN (hypertension)   . Hypertensive heart disease   . Insomnia   . Intracranial hemorrhage ([place])    a. small pontine [place] 2003.  [name] LBBB (left bundle branch block)   . Lumbar spondylolysis    multilevel DJD with mild spinal stenosis L3-L5  . Migraines   . Parkinson's disease ([place])   . Peptic ulcer disease    a. prior hx of bleeding ulcers, including 12/2003 in setting of aspirin use.  . Renal insufficiency    renal duplex scan in 2012 showed no renal artery stenosis  . Sleep disturbance   . Stroke [facility])    a. remote hx of stroke reportedly r/t HTN.  [name] Thrombocytopenia ([place])   . Tremor   . UTI (urinary tract infection)   . Wears glasses    Past Surgical History:  Past Surgical History:  Procedure Laterality Date  . APPENDECTOMY  62   1963  . CHOLECYSTECTOMY  1998  . COLON SURGERY  03/14/2008  . COLONOSCOPY  10/2001  . CORONARY ANGIOPLASTY  6/10,10/05   Dr. [name] cardiologist: last office visit 11/07/14  .  CORONARY ARTERY BYPASS GRAFT  2005  . CYSTOSCOPY WITH LITHOLAPAXY N/A 11/19/2014   Procedure: CYSTOSCOPY WITH LITHOLAPAXY;  Surgeon: [name], MD;  Location: [facility];  Service: Urology;  Laterality: N/A;  . ESOPHAGOGASTRODUODENOSCOPY     10/2001, 12/2003, 04/2004  . LUMBAR LAMINECTOMY/DECOMPRESSION MICRODISCECTOMY N/A 05/03/2013   Procedure: LUMBAR LAMINECTOMY/DCOMPRESSION MICRODISCECTOMY RIGHT LUMBAR FOUR FIVE;  Surgeon: [name], MD;  Location: [place] [facility];  Service: Neurosurgery;  Laterality: N/A;  . SHOULDER ARTHROSCOPY Left 9/10  . stones     hx  . TRANSURETHRAL RESECTION OF PROSTATE N/A 11/19/2014   Procedure: TRANSURETHRAL RESECTION OF THE PROSTATE WITH GYRUS INSTRUMENTS;  Surgeon: [name], MD;  Location: [facility];  Service: Urology;  Laterality: N/A;  . WISDOM TOOTH EXTRACTION     HPI:  Darryl [name] is a 83 y.o. male with medical history significant of Parkinson's disease, hyperlipidemia, stroke, GERD, hiatal hernia, gastritis, depression, migraine headaches, CKD-3, dCHF, CAD, CABG, dilated aortic root, intracranial hemorrhage, left bundle blockage, thrombocytopenia, who presents with generalized weakness and fever. CXR negative but CT angiogram of chest showed bilateral lower lobe linea opacity concerning for aspiration, also showed 3 mm of left upper lobe nodule. MD treating for aspiration pna; has had agitated delirium.  Assessment / Plan / Recommendation Clinical Impression  Darryl [name] affirm frequent coughing with liquids; he denies pharyngeal or esophageal globus sensation. Pt exhibited delayed cough following sips water however unable to coorelate etiology to water consumption versus current lung status with possible pna with clinical subjective assessment only. Parkinson's increases risk. Recommending MBS tomorrow and able to initiate regular texture, thin liquids and pills with water.   SLP Visit Diagnosis: Dysphagia,  unspecified (R13.10)    Aspiration Risk  Moderate aspiration risk    Diet Recommendation Regular;Thin liquid   Liquid Administration via: Cup;Straw Medication Administration: Whole meds with liquid Supervision: Patient able to self feed Compensations: Slow rate;Small sips/bites;Minimize environmental distractions Postural Changes: Seated upright at 90 degrees    Other  Recommendations Oral Care Recommendations: Oral care BID   Follow up Recommendations Other (comment)(TBD)      Frequency and Duration            Prognosis        Swallow Study   General HPI: Darryl [name] is a 83 y.o. male with medical history significant of Parkinson's disease, hyperlipidemia, stroke, GERD, hiatal hernia, gastritis, depression, migraine headaches, CKD-3, dCHF, CAD, CABG, dilated aortic root, intracranial hemorrhage, left bundle blockage, thrombocytopenia, who presents with generalized weakness and fever. CXR negative but CT angiogram of chest showed bilateral lower lobe linea opacity concerning for aspiration, also showed 3 mm of left upper lobe nodule. MD treating for aspiration pna; has had agitated delirium. Type of Study: Bedside Swallow Evaluation Previous Swallow Assessment: (none) Diet Prior to this Study: NPO Temperature Spikes Noted: No Respiratory Status: Room air History of Recent Intubation: No Behavior/Cognition: Alert;Cooperative;Pleasant mood;Requires cueing Oral Cavity Assessment: Within Functional Limits Oral Care Completed by SLP: No Oral Cavity - Dentition: Adequate natural dentition(bridge) Vision: Functional for self-feeding Self-Feeding Abilities: Able to feed self Patient Positioning: Upright in bed Baseline Vocal Quality: Normal Volitional Cough: Weak Volitional Swallow: Able to elicit    Oral/Motor/Sensory Function Overall Oral Motor/Sensory Function: Within functional limits   Ice Chips Ice chips: Not tested   Thin Liquid Thin Liquid:  Impaired Presentation: Cup;Straw Oral Phase Impairments: Other (comment)(none) Oral Phase Functional Implications: (none) Pharyngeal  Phase Impairments: Cough - Delayed    Nectar Thick Nectar Thick Liquid: Not tested   Honey Thick Honey Thick Liquid: Not tested   Puree Puree: Within functional limits   Solid      Solid: Within functional limits        Houston Siren 11/15/2017,4:06 PM  Orbie Pyo Colvin Caroli.Ed [employer] [phone]

## 2017-11-15 NOTE — Progress Notes (Signed)
Pharmacy Antibiotic Note  ARTEMIS LOYAL is a 83 y.o. male admitted on 11/14/2017 with concern for aspiration pneumonia.  Pharmacy has been consulted for Zosyn dosing.  Plan: Continue Zosyn 3.375g IV q8h (4-hour infusion). *Pharmacy will sign off as no further dose adjustments are anticipated. Thank you for the consult!  Height: 5\' 8"  (172.7 cm) Weight: 189 lb (85.7 kg) IBW/kg (Calculated) : 68.4  Temp (24hrs), Avg:99.5 F (37.5 C), Min:98.7 F (37.1 C), Max:100.2 F (37.9 C)  Recent Labs  Lab 11/14/17 2030 11/14/17 2049 11/15/17 0234  WBC 16.9*  --   --   CREATININE 1.43*  --   --   LATICACIDVEN  --  1.42 1.0    Estimated Creatinine Clearance: 43.9 mL/min (A) (by C-G formula based on SCr of 1.43 mg/dL (H)).    Allergies  Allergen Reactions  . Ambien [Zolpidem Tartrate] Other (See Comments)    hallucinations  . Aspirin Other (See Comments)    ulcers  . Celebrex [Celecoxib] Other (See Comments)    Due to ulcer.  . Nsaids Other (See Comments)    Due to ulcer.  . Toprol Xl [Metoprolol Tartrate] Swelling  . Altace [Ramipril]     UNKNOWN  . Amlodipine     Feet swell  . Levothyroxine Other (See Comments)    "made him feel bad"  . Biaxin [Clarithromycin] Nausea Only  . Tetracyclines & Related Nausea Only    Salome Arnt, PharmD, BCPS 11/15/2017 8:33 AM

## 2017-11-15 NOTE — ED Provider Notes (Signed)
I was called the room because patient wanted to leave.  He is here for altered mental status and fever. Suspect the patient is sundowning.  He was verbally abusive to me while I was in the room After further discussion with patient, he agrees to stay.  I assisted patient back to bed.  He was very frustrated about the wait time as well as all the telemetry wires.  I did elect to remove the telemetry wires  To make him comfortable in the bed.  Patient stable at this time   Ripley Fraise, MD 11/15/17 [phone]

## 2017-11-15 NOTE — Progress Notes (Signed)
Patient tolerated sip with medications.

## 2017-11-15 NOTE — H&P (Signed)
History and Physical    Darryl [name] FMB:[id number] DOB: 1935-01-14 DOA: 11/14/2017  Referring MD/NP/PA:   PCP: [name], MD   Patient coming from:  The patient is coming from home.  At baseline, pt is independent for most of ADL.   Chief Complaint: Generalized weakness and fever  HPI: Darryl [name] is a 83 y.o. male with medical history significant of Parkinson's disease, hyperlipidemia, stroke, GERD, depression, migraine headaches, CKD-3, dCHF, CAD, CABG, dilated aortic root, intracranial hemorrhage, left bundle blockage, thrombocytopenia, who presents with generalized weakness and fever.  Pt has not been feeling well for several days.  He has a generalized weakness, but no unilateral weakness or numbness or tingling his extremities.  No facial droop or slurred speech. Pt was found on the floor by his wife, but pt states he did not have fall. He just felt and stayed on the floor. He has 3-year history of Parkinson's. Since last year the Parkinson is gotten worse making walking difficult for him. Patient was complaining of nausea, and maybe had one episode a little bit of vomiting.  No abdominal pain or diarrhea.  Patient denies fever and chills, but his temperature was 100.2 in ED. patient denies chest pain, shortness of breath, cough.  Denies symptoms of UTI.  ED Course: pt was found to have WBC 16.9, stable renal function, lactic acid of 1.42, negative urinalysis, temperature 100.2, no tachypnea, no tachycardia, oxygen saturation 97% on room air, chest x-ray negative.  CT angiogram of chest showed bilateral lower lobe linea opacity concerning for aspiration, also showed 3 mm of left upper lobe nodule.  Review of Systems:   General: has fevers, no chills, no body weight gain, has fatigue HEENT: no blurry vision, hearing changes or sore throat Respiratory: no dyspnea, coughing, wheezing CV: no chest pain, no palpitations GI: has nausea, vomiting, no abdominal pain, diarrhea,  constipation GU: no dysuria, burning on urination, increased urinary frequency, hematuria  Ext: no leg edema Neuro: no unilateral weakness, numbness, or tingling, no vision change or hearing loss Skin: no rash, no skin tear. MSK: No muscle spasm, no deformity, no limitation of range of movement in spin Heme: No easy bruising.  Travel history: No recent long distant travel.  Allergy:  Allergies  Allergen Reactions  . Ambien [Zolpidem Tartrate] Other (See Comments)    hallucinations  . Aspirin Other (See Comments)    ulcers  . Celebrex [Celecoxib] Other (See Comments)    Due to ulcer.  . Nsaids Other (See Comments)    Due to ulcer.  . Toprol Xl [Metoprolol Tartrate] Swelling  . Altace [Ramipril]     UNKNOWN  . Amlodipine     Feet swell  . Levothyroxine Other (See Comments)    "made him feel bad"  . Biaxin [Clarithromycin] Nausea Only  . Tetracyclines & Related Nausea Only    Past Medical History:  Diagnosis Date  . Arthritis   . BPH (benign prostatic hypertrophy) with urinary retention   . Chronic diastolic CHF (congestive heart failure) ([place])   . Colon polyp   . Coronary atherosclerosis of native coronary artery    a. s/p CABG 11/2003. b. s/p DES to radial-OM and SVG-RCA 11/05. c. s/p BMS to LCx 11/2008.  . Dilated aortic root ([place]) 05/29/2014   73mm by echo 04/2017  . Diverticulosis   . Dyslipidemia    statin intolerant  . Elevated fasting glucose   . Gastritis    a. by EGD 04/2004.  [name] H/O  urinary retention    post surgical procedures; has foley cath  . Headache(784.0)   . Hiatal hernia   . HOH (hard of hearing)    slight hoh, rt>l  . HTN (hypertension)   . Hypertensive heart disease   . Insomnia   . Intracranial hemorrhage ([place])    a. small pontine [place] 2003.  [name] LBBB (left bundle branch block)   . Lumbar spondylolysis    multilevel DJD with mild spinal stenosis L3-L5  . Migraines   . Parkinson's disease ([place])   . Peptic ulcer disease    a. prior hx of  bleeding ulcers, including 12/2003 in setting of aspirin use.  . Renal insufficiency    renal duplex scan in 2012 showed no renal artery stenosis  . Sleep disturbance   . Stroke [facility])    a. remote hx of stroke reportedly r/t HTN.  [name] Thrombocytopenia ([place])   . Tremor   . UTI (urinary tract infection)   . Wears glasses     Past Surgical History:  Procedure Laterality Date  . APPENDECTOMY  62   1963  . CHOLECYSTECTOMY  1998  . COLON SURGERY  03/14/2008  . COLONOSCOPY  10/2001  . CORONARY ANGIOPLASTY  6/10,10/05   Dr. [name] cardiologist: last office visit 11/07/14  . CORONARY ARTERY BYPASS GRAFT  2005  . CYSTOSCOPY WITH LITHOLAPAXY N/A 11/19/2014   Procedure: CYSTOSCOPY WITH LITHOLAPAXY;  Surgeon: [name], MD;  Location[facility];  Service: Urology;  Laterality: N/A;  . ESOPHAGOGASTRODUODENOSCOPY     10/2001, 12/2003, 04/2004  . LUMBAR LAMINECTOMY/DECOMPRESSION MICRODISCECTOMY N/A 05/03/2013   Procedure: LUMBAR LAMINECTOMY/DCOMPRESSION MICRODISCECTOMY RIGHT LUMBAR FOUR FIVE;  Surgeon: [name], MD;  Location: [place] [facility];  Service: Neurosurgery;  Laterality: N/A;  . SHOULDER ARTHROSCOPY Left 9/10  . stones     hx  . TRANSURETHRAL RESECTION OF PROSTATE N/A 11/19/2014   Procedure: TRANSURETHRAL RESECTION OF THE PROSTATE WITH GYRUS INSTRUMENTS;  Surgeon: [name], MD;  Location: [facility];  Service: Urology;  Laterality: N/A;  . WISDOM TOOTH EXTRACTION      Social History:  reports that he quit smoking about 58 years ago. His smoking use included cigarettes. He has a 10.00 pack-year smoking history. He has never used smokeless tobacco. He reports that he does not drink alcohol or use drugs.  Family History:  Family History  Problem Relation Age of Onset  . CAD Father 67       ASCAD  . CAD Sister      Prior to Admission medications   Medication Sig Start Date End Date Taking? Authorizing Provider  acetaminophen (TYLENOL) 500  MG tablet Take 1,000 mg by mouth every 8 (eight) hours as needed for mild pain or moderate pain.   Yes [provider]  aspirin EC 81 MG tablet Take 81 mg by mouth. Patient takes 3 times a week   Yes [provider]  carvedilol (COREG) 6.25 MG tablet Take 1 tablet (6.25 mg total) by mouth 2 (two) times daily. 08/31/17  Yes Turner, Eber Hong, MD  citalopram (CELEXA) 20 MG tablet Take 20 mg by mouth at bedtime.    Yes [provider]  cloNIDine (CATAPRES) 0.3 MG tablet Take 0.3 mg by mouth 2 (two) times daily.    Yes [provider]  furosemide (LASIX) 20 MG tablet Take 1 tablet (20 mg total) by mouth daily. 08/31/17 08/26/18 Yes Turner, Eber Hong, MD  hydrALAZINE (APRESOLINE) 25 MG  tablet Take 1 tablet (25 mg total) by mouth 3 (three) times daily. 08/31/17 08/26/18 Yes Turner, Eber Hong, MD  irbesartan (AVAPRO) 300 MG tablet Take 300 mg by mouth at bedtime.    Yes [provider]  KLOR-CON 10 10 MEQ tablet Take 1 tablet (10 mEq total) by mouth daily. 10/27/17 10/22/18 Yes Turner, Eber Hong, MD  nitroGLYCERIN (NITROSTAT) 0.4 MG SL tablet Place 1 tablet (0.4 mg total) under the tongue every 5 (five) minutes as needed for chest pain. 08/31/17  Yes Turner, Eber Hong, MD  pantoprazole (PROTONIX) 40 MG tablet Take 40 mg by mouth 3 (three) times a week.    Yes [provider]  traMADol (ULTRAM) 50 MG tablet Take 50 mg by mouth daily as needed (pain).    Yes [provider]    Physical Exam: Vitals:   11/15/17 0000 11/15/17 0100 11/15/17 0200 11/15/17 0300  BP: (!) 147/83 (!) 146/81 128/79 (!) 150/86  Pulse: 73 66 63 64  Resp: 20 17 18 19   Temp:      TempSrc:      SpO2: 97% 96% 97% 95%  Weight:      Height:       General: Not in acute distress HEENT:       Eyes: PERRL, EOMI, no scleral icterus.       ENT: No discharge from the ears and nose, no pharynx injection, no tonsillar enlargement.        Neck: No JVD, no bruit, no mass felt. Heme: No neck lymph  node enlargement. Cardiac: S1/S2, RRR, No murmurs, No gallops or rubs. Respiratory: No rales, wheezing, rhonchi or rubs. GI: Soft, nondistended, nontender, no rebound pain, no organomegaly, BS present. GU: No hematuria Ext: No pitting leg edema bilaterally. 2+DP/PT pulse bilaterally. Musculoskeletal: No joint deformities, No joint redness or warmth, no limitation of ROM in spin. Skin: No rashes.  Neuro: Alert, oriented X3, cranial nerves II-XII grossly intact, moves all extremities normally.  Psych: Patient is not psychotic, no suicidal or hemocidal ideation.  Labs on Admission: I have personally reviewed following labs and imaging studies  CBC: Recent Labs  Lab 11/14/17 2030  WBC 16.9*  HGB 14.8  HCT 41.3  MCV 87.7  PLT 323*   Basic Metabolic Panel: Recent Labs  Lab 11/14/17 2030  NA 141  K 4.1  CL 110  CO2 24  GLUCOSE 147*  BUN 18  CREATININE 1.43*  CALCIUM 9.2   GFR: Estimated Creatinine Clearance: 43.9 mL/min (A) (by C-G formula based on SCr of 1.43 mg/dL (H)). Liver Function Tests: Recent Labs  Lab 11/14/17 2030  AST 23  ALT 20  ALKPHOS 89  BILITOT 0.8  PROT 6.6  ALBUMIN 3.8   No results for input(s): LIPASE, AMYLASE in the last 168 hours. No results for input(s): AMMONIA in the last 168 hours. Coagulation Profile: No results for input(s): INR, PROTIME in the last 168 hours. Cardiac Enzymes: No results for input(s): CKTOTAL, CKMB, CKMBINDEX, TROPONINI in the last 168 hours. BNP (last 3 results) No results for input(s): PROBNP in the last 8760 hours. HbA1C: No results for input(s): HGBA1C in the last 72 hours. CBG: No results for input(s): GLUCAP in the last 168 hours. Lipid Profile: No results for input(s): CHOL, HDL, LDLCALC, TRIG, CHOLHDL, LDLDIRECT in the last 72 hours. Thyroid Function Tests: No results for input(s): TSH, T4TOTAL, FREET4, T3FREE, THYROIDAB in the last 72 hours. Anemia Panel: No results for input(s): VITAMINB12, FOLATE,  FERRITIN, TIBC, IRON,  RETICCTPCT in the last 72 hours. Urine analysis:    Component Value Date/Time   COLORURINE YELLOW 11/14/2017 2236   APPEARANCEUR CLEAR 11/14/2017 2236   LABSPEC 1.017 11/14/2017 2236   PHURINE 6.0 11/14/2017 2236   GLUCOSEU NEGATIVE 11/14/2017 2236   HGBUR NEGATIVE 11/14/2017 2236   BILIRUBINUR NEGATIVE 11/14/2017 2236   KETONESUR 5 (A) 11/14/2017 2236   PROTEINUR NEGATIVE 11/14/2017 2236   UROBILINOGEN 1.0 10/11/2014 1609   NITRITE NEGATIVE 11/14/2017 2236   LEUKOCYTESUR NEGATIVE 11/14/2017 2236   Sepsis Labs: @LABRCNTIP (procalcitonin:4,lacticidven:4) )No results found for this or any previous visit (from the past 240 hour(s)).   Radiological Exams on Admission: Dg Chest 2 View  Result Date: 11/14/2017 CLINICAL DATA:  [place] EXAM: CHEST - 2 VIEW COMPARISON:  10/08/2017 FINDINGS: Prior CABG. Heart and mediastinal contours are within normal limits. No focal opacities or effusions. No acute bony abnormality. IMPRESSION: No active cardiopulmonary disease. Electronically Signed   By: Rolm Baptise M.D.   On: 11/14/2017 20:56   Ct Chest Wo Contrast  Result Date: 11/14/2017 CLINICAL DATA:  Chronic dyspnea.  Negative x-ray. EXAM: CT CHEST WITHOUT CONTRAST TECHNIQUE: Multidetector CT imaging of the chest was performed following the standard protocol without IV contrast. COMPARISON:  Radiographs earlier this day. FINDINGS: Cardiovascular: Post CABG with calcification of the native coronary arteries. Mild atherosclerosis of the thoracic aorta which is normal in caliber. The heart is normal in size. Pulmonary arteries are normal in caliber. No pericardial effusion. Mediastinum/Nodes: No enlarged mediastinal lymph nodes. No evidence of hilar adenopathy allowing for lack contrast. Small left thyroid calcification does not meet size criteria for further evaluation. The esophagus is decompressed. Lungs/Pleura: Mild dependent ground-glass and linear opacities in both lower lobes. Mild  lower lobe bronchial thickening. No confluent consolidation. 3 mm pulmonary nodule in the anterior left upper lobe image 62 series 4. Trachea and mainstem bronchi are patent. No pulmonary edema or pleural fluid. Upper Abdomen: Postcholecystectomy.  No acute finding. Musculoskeletal: Post median sternotomy. There are no acute or suspicious osseous abnormalities. IMPRESSION: 1. Mild dependent linear opacities within both lower lobes, typically seen with hypoventilatory atelectasis. Aspiration is considered given distribution, however felt less likely given lack of debris in the trachea and bronchi. 2. Post CABG with calcification of native coronary arteries. Mild aortic atherosclerosis. 3. Left upper lobe 3 mm pulmonary nodule. No follow-up needed if patient is low-risk. Non-contrast chest CT can be considered in 12 months if patient is high-risk. This recommendation follows the consensus statement: Guidelines for Management of Incidental Pulmonary Nodules Detected on CT Images: From the Fleischner Society 2017; Radiology 2017; 284:228-243. Aortic Atherosclerosis (ICD10-I70.0). Electronically Signed   By: Jeb Levering M.D.   On: 11/14/2017 23:47     EKG:  Not done in ED, will get one.   Assessment/Plan Principal Problem:   Sepsis ([place]) Active Problems:   Benign essential HTN   LBBB (left bundle branch block)   Parkinson's disease ([place])   Stroke ([place])   Hx of CABG 2005, PCI 2010   CKD (chronic kidney disease), stage III (H[facility])   Diastolic dysfunction   CAD (coronary artery disease), native coronary artery   Aspiration pneumonia (H[facility])   CHF (congestive heart failure) ([place])   Sepsis [facility]): Patient meets criteria for sepsis with leukocytosis and fever.  Lactic acid is normal.  Hemodynamically stable.  Etiology is not clear.  Potential possibility is aspiration pneumonia as CT of chest that showed bilateral lower lobe opacity.  Given history of Parkinson's disease, aspiration  pneumonia is  likely.  - Will place on telemetry bed for obs. - will admit to tele bed as inpt - Zosyn IV - Mucinex for cough  - prn Albuterol Nebs for SOB - Urine legionella and S. pneumococcal antigen - Follow up blood culture x2, sputum culture and respiratory virus panel, plus Flu pcr - will get Procalcitonin and trend lactic acid level per sepsis protocol - IVF: 500 mL of NS bolus in ED, followed by 75 mL per hour of NS (patient has ongestive heart failure, limiting aggressive IV fluids treatment) -SLP  HTN:  -Continue home medications: Coreg, clonidine, hydralazine, irbesartan -IV hydralazine prn  Parkinson's disease ([place]): pt is not taking meds at home. Pt has worsening since last year. -May need to give referral to neurology  Hx of Stroke [facility]): -Aspirin  CAD: s/p of CABG 2005, PCI 2010. No CP -Continue aspirin, Coreg - PRN nitroglycerin  CKD (chronic kidney disease), stage III ([place]): Stable.  Baseline creatinine 1.2-1.5.  His creatinine is 1.43, BUN 18. -Follow-up renal function by BMP  Diastolic dysfunction: 2D alcohol 05/10/2017 showed EF of 50-60% with grade 1 diastolic dysfunction.  Patient does not have leg edema or JVD.  CHF is compensated. - Hold Lasix due to sepsis -check BNP  Lung nodule: 3 mm of left uuper lobe nodule by CT scan. -f/u with PCP   DVT ppx: sQ Lovenox Code Status: Full code Family Communication: None at bed side. Disposition Plan:  Anticipate discharge back to previous home environment Consults called:  none Admission status: Inpatient/tele     Date of Service 11/15/2017    Ivor Costa [facility] Pager [phone]  If 7PM-7AM, please contact night-coverage [email or web address] Password [facility] 11/15/2017, 3:47 AM

## 2017-11-15 NOTE — ED Provider Notes (Signed)
[facility] EMERGENCY DEPARTMENT Provider Note   CSN: [id number] Arrival date & time: 11/14/17  2022     History   Chief Complaint Chief Complaint  Patient presents with  . Fall  . Altered Mental Status    HPI Darryl [name] is a 83 y.o. male.  Patient brought in by EMS.  Patient was found on the floor by his wife after she had taken the grandkids home today.  She was gone for approximately 4 hours.  Patient had not been feeling well for the past few days.  He has a 3-year history of Parkinson's but in the last year the Parkinson is gotten worse making walking difficult for him.  Patient was complaining of nausea and maybe had one episode a little bit of vomiting.  Patient wife felt that he has been a little bit lethargic and some generalized weakness.  They denied any fevers or any complaint of pain.  Upon her arrival here patient had a temp of 100.2.  Patient felt warm.  Patient in the past has had trouble with urinary tract infections.  No upper respiratory symptoms.  No abdominal pain no chest pain.     Past Medical History:  Diagnosis Date  . Arthritis   . BPH (benign prostatic hypertrophy) with urinary retention   . Chronic diastolic CHF (congestive heart failure) ([place])   . Colon polyp   . Coronary atherosclerosis of native coronary artery    a. s/p CABG 11/2003. b. s/p DES to radial-OM and SVG-RCA 11/05. c. s/p BMS to LCx 11/2008.  . Dilated aortic root ([place]) 05/29/2014   77mm by echo 04/2017  . Diverticulosis   . Dyslipidemia    statin intolerant  . Elevated fasting glucose   . Gastritis    a. by EGD 04/2004.  . H/O urinary retention    post surgical procedures; has foley cath  . Headache(784.0)   . Hiatal hernia   . HOH (hard of hearing)    slight hoh, rt>l  . HTN (hypertension)   . Hypertensive heart disease   . Insomnia   . Intracranial hemorrhage ([place])    a. small pontine [place] 2003.  [name] LBBB (left bundle branch block)   . Lumbar  spondylolysis    multilevel DJD with mild spinal stenosis L3-L5  . Migraines   . Parkinson's disease ([place])   . Peptic ulcer disease    a. prior hx of bleeding ulcers, including 12/2003 in setting of aspirin use.  . Renal insufficiency    renal duplex scan in 2012 showed no renal artery stenosis  . Sleep disturbance   . Stroke [facility])    a. remote hx of stroke reportedly r/t HTN.  [name] Thrombocytopenia ([place])   . Tremor   . UTI (urinary tract infection)   . Wears glasses     Patient Active Problem List   Diagnosis Date Noted  . B12 deficiency 07/13/2016  . Orthostatic hypotension 03/26/2016  . Chronic diastolic heart failure ([place]) 12/02/2015  . CAD (coronary artery disease), native coronary artery 11/28/2014  . BPH (benign prostatic hypertrophy) with urinary retention 11/19/2014  . Diastolic dysfunction 87/86/7672  . UTI (urinary tract infection) 10/07/2014  . Abnormal EKG 10/04/2014  . Urinary retention 10/04/2014  . BPH (benign prostatic hypertrophy) 10/04/2014  . UTI (lower urinary tract infection) 10/03/2014  . Sepsis ([place]) 10/03/2014  . Parkinsonism ([place]) 06/12/2014  . Dilated aortic root ([place]) 05/29/2014  . CKD (chronic kidney disease), stage III ([place]) 04/25/2014  .  Gastritis   . Peptic ulcer disease   . Stroke ([place])   . Hx of CABG 2005, PCI 2010   . Parkinson's disease ([place])   . Small vessel disease, cerebrovascular 10/30/2013  . Intracerebral hemorrhage ([place]) 10/30/2013  . Tremor 08/14/2013  . Benign essential HTN 04/13/2013  . Dyslipidemia 04/13/2013  . LBBB (left bundle branch block) 04/13/2013  . Preoperative clearance 04/13/2013  . DJD (degenerative joint disease) of lumbar spine 04/13/2013    Past Surgical History:  Procedure Laterality Date  . APPENDECTOMY  62   1963  . CHOLECYSTECTOMY  1998  . COLON SURGERY  03/14/2008  . COLONOSCOPY  10/2001  . CORONARY ANGIOPLASTY  6/10,10/05   Dr. [name] cardiologist: last office visit 11/07/14  . CORONARY  ARTERY BYPASS GRAFT  2005  . CYSTOSCOPY WITH LITHOLAPAXY N/A 11/19/2014   Procedure: CYSTOSCOPY WITH LITHOLAPAXY;  Surgeon: [name], MD;  [facility];  Service: Urology;  Laterality: N/A;  . ESOPHAGOGASTRODUODENOSCOPY     10/2001, 12/2003, 04/2004  . LUMBAR LAMINECTOMY/DECOMPRESSION MICRODISCECTOMY N/A 05/03/2013   Procedure: LUMBAR LAMINECTOMY/DCOMPRESSION MICRODISCECTOMY RIGHT LUMBAR FOUR FIVE;  Surgeon: [name], MD;  Location: [facility];  Service: Neurosurgery;  Laterality: N/A;  . SHOULDER ARTHROSCOPY Left 9/10  . stones     hx  . TRANSURETHRAL RESECTION OF PROSTATE N/A 11/19/2014   Procedure: TRANSURETHRAL RESECTION OF THE PROSTATE WITH GYRUS INSTRUMENTS;  Surgeon: [name], MD;  Location: [facility];  Service: Urology;  Laterality: N/A;  . WISDOM TOOTH EXTRACTION          Home Medications    Prior to Admission medications   Medication Sig Start Date End Date Taking? Authorizing Provider  acetaminophen (TYLENOL) 500 MG tablet Take 1,000 mg by mouth every 8 (eight) hours as needed for mild pain or moderate pain.   Yes [provider]  aspirin EC 81 MG tablet Take 81 mg by mouth. Patient takes 3 times a week   Yes [provider]  carvedilol (COREG) 6.25 MG tablet Take 1 tablet (6.25 mg total) by mouth 2 (two) times daily. 08/31/17  Yes Turner, Eber Hong, MD  citalopram (CELEXA) 20 MG tablet Take 20 mg by mouth at bedtime.    Yes [provider]  cloNIDine (CATAPRES) 0.3 MG tablet Take 0.3 mg by mouth 2 (two) times daily.    Yes [provider]  furosemide (LASIX) 20 MG tablet Take 1 tablet (20 mg total) by mouth daily. 08/31/17 08/26/18 Yes Turner, Eber Hong, MD  hydrALAZINE (APRESOLINE) 25 MG tablet Take 1 tablet (25 mg total) by mouth 3 (three) times daily. 08/31/17 08/26/18 Yes Turner, Eber Hong, MD  irbesartan (AVAPRO) 300 MG tablet Take 300 mg by mouth at bedtime.    Yes [provider]    KLOR-CON 10 10 MEQ tablet Take 1 tablet (10 mEq total) by mouth daily. 10/27/17 10/22/18 Yes Turner, Eber Hong, MD  nitroGLYCERIN (NITROSTAT) 0.4 MG SL tablet Place 1 tablet (0.4 mg total) under the tongue every 5 (five) minutes as needed for chest pain. 08/31/17  Yes Turner, Eber Hong, MD  pantoprazole (PROTONIX) 40 MG tablet Take 40 mg by mouth 3 (three) times a week.    Yes [provider]  traMADol (ULTRAM) 50 MG tablet Take 50 mg by mouth daily as needed (pain).    Yes [provider]    Family History Family History  Problem Relation Age of Onset  . CAD Father 10  ASCAD  . CAD Sister     Social History Social History   Tobacco Use  . Smoking status: Former Smoker    Packs/day: 2.00    Years: 5.00    Pack years: 10.00    Types: Cigarettes    Last attempt to quit: 04/28/1959    Years since quitting: 58.5  . Smokeless tobacco: Never Used  . Tobacco comment: 55+ yrs ago  Substance Use Topics  . Alcohol use: No  . Drug use: No     Allergies   Ambien [zolpidem tartrate]; Aspirin; Celebrex [celecoxib]; Nsaids; Toprol xl [metoprolol tartrate]; Altace [ramipril]; Amlodipine; Levothyroxine; Biaxin [clarithromycin]; and Tetracyclines & related   Review of Systems Review of Systems  Constitutional: Positive for fever.  HENT: Negative for congestion.   Eyes: Negative for visual disturbance.  Respiratory: Negative for shortness of breath.   Cardiovascular: Negative for chest pain.  Gastrointestinal: Positive for nausea and vomiting. Negative for abdominal pain.  Genitourinary: Positive for decreased urine volume. Negative for dysuria.  Musculoskeletal: Positive for myalgias.  Skin: Negative for rash.  Neurological: Negative for headaches.  Hematological: Does not bruise/bleed easily.  Psychiatric/Behavioral: Positive for confusion.     Physical Exam Updated Vital Signs BP (!) 147/83   Pulse 73   Temp 98.7 F (37.1 C) (Oral)   Resp 20   Ht 1.727 m  (5\' 8" )   Wt 85.7 kg (189 lb)   SpO2 97%   BMI 28.74 kg/m   Physical Exam  Constitutional: He appears well-developed and well-nourished. No distress.  HENT:  Head: Normocephalic and atraumatic.  Mucous membranes slightly dry.  Eyes: Pupils are equal, round, and reactive to light. Conjunctivae and EOM are normal.  Neck: Neck supple.  Cardiovascular: Normal rate, regular rhythm and normal heart sounds.  Pulmonary/Chest: Effort normal and breath sounds normal. No respiratory distress. He has no wheezes. He has no rales.  Abdominal: Soft. Bowel sounds are normal. He exhibits no distension. There is no tenderness.  Musculoskeletal: Normal range of motion. He exhibits no edema.  Neurological: He is alert.  No gross motor deficits.  Skin: Skin is warm. No rash noted.  Nursing note and vitals reviewed.    ED Treatments / Results  Labs (all labs ordered are listed, but only abnormal results are displayed) Labs Reviewed  COMPREHENSIVE METABOLIC PANEL - Abnormal; Notable for the following components:      Result Value   Glucose, Bld 147 (*)    Creatinine, Ser 1.43 (*)    GFR calc non Af Amer 45 (*)    GFR calc Af Amer 52 (*)    All other components within normal limits  CBC - Abnormal; Notable for the following components:   WBC 16.9 (*)    Platelets 129 (*)    All other components within normal limits  URINALYSIS, ROUTINE W REFLEX MICROSCOPIC - Abnormal; Notable for the following components:   Ketones, ur 5 (*)    All other components within normal limits  URINE CULTURE  CULTURE, BLOOD (ROUTINE X 2)  CULTURE, BLOOD (ROUTINE X 2)  INFLUENZA PANEL BY PCR (TYPE A & B)  CBG MONITORING, ED  I-STAT CG4 LACTIC ACID, ED  I-STAT CG4 LACTIC ACID, ED    EKG None  Radiology Dg Chest 2 View  Result Date: 11/14/2017 CLINICAL DATA:  Fall EXAM: CHEST - 2 VIEW COMPARISON:  10/08/2017 FINDINGS: Prior CABG. Heart and mediastinal contours are within normal limits. No focal opacities or  effusions. No acute bony abnormality.  IMPRESSION: No active cardiopulmonary disease. Electronically Signed   By: Rolm Baptise M.D.   On: 11/14/2017 20:56   Ct Chest Wo Contrast  Result Date: 11/14/2017 CLINICAL DATA:  Chronic dyspnea.  Negative x-ray. EXAM: CT CHEST WITHOUT CONTRAST TECHNIQUE: Multidetector CT imaging of the chest was performed following the standard protocol without IV contrast. COMPARISON:  Radiographs earlier this day. FINDINGS: Cardiovascular: Post CABG with calcification of the native coronary arteries. Mild atherosclerosis of the thoracic aorta which is normal in caliber. The heart is normal in size. Pulmonary arteries are normal in caliber. No pericardial effusion. Mediastinum/Nodes: No enlarged mediastinal lymph nodes. No evidence of hilar adenopathy allowing for lack contrast. Small left thyroid calcification does not meet size criteria for further evaluation. The esophagus is decompressed. Lungs/Pleura: Mild dependent ground-glass and linear opacities in both lower lobes. Mild lower lobe bronchial thickening. No confluent consolidation. 3 mm pulmonary nodule in the anterior left upper lobe image 62 series 4. Trachea and mainstem bronchi are patent. No pulmonary edema or pleural fluid. Upper Abdomen: Postcholecystectomy.  No acute finding. Musculoskeletal: Post median sternotomy. There are no acute or suspicious osseous abnormalities. IMPRESSION: 1. Mild dependent linear opacities within both lower lobes, typically seen with hypoventilatory atelectasis. Aspiration is considered given distribution, however felt less likely given lack of debris in the trachea and bronchi. 2. Post CABG with calcification of native coronary arteries. Mild aortic atherosclerosis. 3. Left upper lobe 3 mm pulmonary nodule. No follow-up needed if patient is low-risk. Non-contrast chest CT can be considered in 12 months if patient is high-risk. This recommendation follows the consensus statement: Guidelines for  Management of Incidental Pulmonary Nodules Detected on CT Images: From the Fleischner Society 2017; Radiology 2017; 284:228-243. Aortic Atherosclerosis (ICD10-I70.0). Electronically Signed   By: Jeb Levering M.D.   On: 11/14/2017 23:47    Procedures Procedures (including critical care time)  Medications Ordered in ED Medications  0.9 %  sodium chloride infusion ( Intravenous New Bag/Given 11/14/17 2226)  acetaminophen (TYLENOL) tablet 1,000 mg (has no administration in time range)  sodium chloride 0.9 % bolus 500 mL (0 mLs Intravenous Stopped 11/15/17 0020)     Initial Impression / Assessment and Plan / ED Course  I have reviewed the triage vital signs and the nursing notes.  Pertinent labs & imaging results that were available during my care of the patient were reviewed by me and considered in my medical decision making (see chart for details).     Patient clearly with fever.  Chest x-ray negative leukocytosis 16,000 urinalysis negative.  Blood cultures done and pending lactic acid normal.  Not tachycardic not hypotensive patient remains hot despite repeat oral temp that was normal.  Clearly has febrile state.  CT chest was done to rule out subtle pneumonia raise some question of possible aspiration.  Could be the cause.  Discussed with hospitalist patient will be admitted.  Patient currently does not meet sepsis criteria.  Influenza testing ordered.  Final Clinical Impressions(s) / ED Diagnoses   Final diagnoses:  Fever, unspecified fever cause  Weakness generalized    ED Discharge Orders    None       Fredia Sorrow, MD 11/15/17 [phone]

## 2017-11-15 NOTE — Progress Notes (Signed)
Pharmacy Antibiotic Note  FRANCIS DOENGES is a 83 y.o. male admitted on 11/14/2017 with concern for aspiration pneumonia.  Pharmacy has been consulted for Zosyn dosing.  Plan: Zosyn 3.375g IV q8h (4-hour infusion).  Height: 5\' 8"  (172.7 cm) Weight: 189 lb (85.7 kg) IBW/kg (Calculated) : 68.4  Temp (24hrs), Avg:99.5 F (37.5 C), Min:98.7 F (37.1 C), Max:100.2 F (37.9 C)  Recent Labs  Lab 11/14/17 2030 11/14/17 2049  WBC 16.9*  --   CREATININE 1.43*  --   LATICACIDVEN  --  1.42    Estimated Creatinine Clearance: 43.9 mL/min (A) (by C-G formula based on SCr of 1.43 mg/dL (H)).    Allergies  Allergen Reactions  . Ambien [Zolpidem Tartrate] Other (See Comments)    hallucinations  . Aspirin Other (See Comments)    ulcers  . Celebrex [Celecoxib] Other (See Comments)    Due to ulcer.  . Nsaids Other (See Comments)    Due to ulcer.  . Toprol Xl [Metoprolol Tartrate] Swelling  . Altace [Ramipril]     UNKNOWN  . Amlodipine     Feet swell  . Levothyroxine Other (See Comments)    "made him feel bad"  . Biaxin [Clarithromycin] Nausea Only  . Tetracyclines & Related Nausea Only     Thank you for allowing pharmacy to be a part of this patient's care.  Wynona Neat, PharmD, BCPS  11/15/2017 2:38 AM

## 2017-11-16 ENCOUNTER — Inpatient Hospital Stay (HOSPITAL_COMMUNITY): Payer: Medicare Other

## 2017-11-16 DIAGNOSIS — Z951 Presence of aortocoronary bypass graft: Secondary | ICD-10-CM

## 2017-11-16 LAB — BASIC METABOLIC PANEL
Anion gap: 10 (ref 5–15)
BUN: 14 mg/dL (ref 6–20)
CALCIUM: 9.2 mg/dL (ref 8.9–10.3)
CHLORIDE: 108 mmol/L (ref 101–111)
CO2: 21 mmol/L — ABNORMAL LOW (ref 22–32)
CREATININE: 1.53 mg/dL — AB (ref 0.61–1.24)
GFR calc non Af Amer: 41 mL/min — ABNORMAL LOW (ref >60)
GFR, EST AFRICAN AMERICAN: 48 mL/min — AB (ref >60)
Glucose, Bld: 137 mg/dL — ABNORMAL HIGH (ref 65–99)
Potassium: 3.6 mmol/L (ref 3.5–5.1)
SODIUM: 139 mmol/L (ref 135–145)

## 2017-11-16 LAB — LEGIONELLA PNEUMOPHILA SEROGP 1 UR AG: L. pneumophila Serogp 1 Ur Ag: NEGATIVE

## 2017-11-16 LAB — URINE CULTURE: CULTURE: NO GROWTH

## 2017-11-16 MED ORDER — FUROSEMIDE 20 MG PO TABS
20.0000 mg | ORAL_TABLET | Freq: Every day | ORAL | Status: DC
  Administered 2017-11-16 – 2017-11-17 (×2): 20 mg via ORAL
  Filled 2017-11-16 (×2): qty 1

## 2017-11-16 MED ORDER — RESOURCE THICKENUP CLEAR PO POWD
ORAL | Status: DC | PRN
  Filled 2017-11-16 (×2): qty 125

## 2017-11-16 NOTE — Evaluation (Signed)
Occupational Therapy Evaluation Patient Details Name: Darryl [name] MRN: [id number] DOB: [birth year] Today's Date: 11/16/2017    History of Present Illness 83 y.o. male with admitted with weakness, fever and aspiration PNa. PMHx: agitated delirium, HLD, Parkinson's disease, CVA, GERD, hiatal hernia, depression, migraine, CKD-3, dCHF, CAD, CABG, ICH, L BBB, thrombocytopenia   Clinical Impression   PTA, pt reports he was living with his wife and was independent with BADLs with wife performing IADLs. Pt currently performing ADLs and functional mobility at supervision level. Pt reporting several falls at home and present with decreased ST memory and perseverating on topic of his wife. Pt would benefit from further acute OT to facilitate safe dc. Pending 24 hour support at home, recommend dc home with HHOT to increase safety and independence with ADLs, decrease risk of falls, and decrease caregiver burden.     Follow Up Recommendations  Home health OT;Supervision/Assistance - 24 hour    Equipment Recommendations  None recommended by OT    Recommendations for Other Services PT consult     Precautions / Restrictions Precautions Precautions: Fall      Mobility Bed Mobility Overal bed mobility: Needs Assistance Bed Mobility: Sit to Supine       Sit to supine: Supervision   General bed mobility comments: supervision for safety. No phsyical A needed  Transfers Overall transfer level: Needs assistance Equipment used: Rolling walker (2 wheeled) Transfers: Sit to/from Stand Sit to Stand: Supervision         General transfer comment: supervision for safety    Balance Overall balance assessment: No apparent balance deficits (not formally assessed)                                         ADL either performed or assessed with clinical judgement   ADL Overall ADL's : Needs assistance/impaired Eating/Feeding: Set up;Sitting   Grooming: Oral care;Wash/dry  hands;Set up;Supervision/safety;Standing Grooming Details (indicate cue type and reason): Pt performing oral care and hand hygiene with set up and supervision for safety Upper Body Bathing: Set up;Supervision/ safety;Sitting   Lower Body Bathing: Set up;Supervison/ safety;Sit to/from stand   Upper Body Dressing : Set up;Supervision/safety;Sitting   Lower Body Dressing: Supervision/safety;Set up;Sit to/from stand   Toilet Transfer: Sales executive;Ambulation;RW   Toileting- Clothing Manipulation and Hygiene: Set up;Supervision/safety;Sit to/from stand       Functional mobility during ADLs: Supervision/safety;Rolling walker General ADL Comments: Pt performing ADLs and functional mobility with supervision for safety.      Vision         Perception     Praxis      Pertinent Vitals/Pain Pain Assessment: No/denies pain     Hand Dominance Right   Extremity/Trunk Assessment Upper Extremity Assessment Upper Extremity Assessment: Overall WFL for tasks assessed   Lower Extremity Assessment Lower Extremity Assessment: Defer to PT evaluation       Communication Communication Communication: No difficulties   Cognition Arousal/Alertness: Awake/alert Behavior During Therapy: WFL for tasks assessed/performed Overall Cognitive Status: History of cognitive impairments - at baseline                                 General Comments: Pt asking about his wife throughout session "Where is she?" "did she go get breakfast?" "I am surprised she isn't here yet."   General Comments  Exercises     Shoulder Instructions      Home Living Family/patient expects to be discharged to:: Private residence Living Arrangements: Spouse/significant other Available Help at Discharge: Family;Available 24 hours/day Type of Home: House Home Access: Stairs to enter [employer] of Steps: 3   Home Layout: One level     Bathroom Shower/Tub:  Teacher, early years/pre: Standard     Home Equipment: Environmental consultant - 2 wheels   Additional Comments: Wife not present to confirm information. Pt reports three falls at home while walking in the yard.       Prior Functioning/Environment Level of Independence: Independent with assistive device(s)        Comments: Wife not present to confirm. Pt reports he using RW for functional mobility. Performs BADLs.         OT Problem List: Decreased range of motion;Decreased activity tolerance;Decreased knowledge of use of DME or AE;Decreased knowledge of precautions      OT Treatment/Interventions: Self-care/ADL training;Therapeutic exercise;Energy conservation;DME and/or AE instruction;Therapeutic activities;Patient/family education    OT Goals(Current goals can be found in the care plan section) Acute Rehab OT Goals Patient Stated Goal: "See my wife" OT Goal Formulation: With patient Time For Goal Achievement: 11/30/17 Potential to Achieve Goals: Good ADL Goals Pt Will Perform Grooming: with modified independence;standing Pt Will Perform Upper Body Dressing: with modified independence;standing Pt Will Perform Lower Body Dressing: with modified independence;sit to/from stand Pt Will Transfer to Toilet: with modified independence;ambulating;regular height toilet Pt Will Perform Tub/Shower Transfer: with supervision;ambulating;Tub transfer  OT Frequency: Min 2X/week   Barriers to D/C:            Co-evaluation              AM-PAC PT "6 Clicks" Daily Activity     Outcome Measure Help from another person eating meals?: None Help from another person taking care of personal grooming?: A Little Help from another person toileting, which includes using toliet, bedpan, or urinal?: A Little Help from another person bathing (including washing, rinsing, drying)?: A Little Help from another person to put on and taking off regular upper body clothing?: None Help from another person  to put on and taking off regular lower body clothing?: A Little 6 Click Score: 20   End of Session Equipment Utilized During Treatment: Rolling walker Nurse Communication: Mobility status  Activity Tolerance: Patient tolerated treatment well Patient left: in bed;with call bell/phone within reach;with bed alarm set;with nursing/sitter in room  OT Visit Diagnosis: Unsteadiness on feet (R26.81);Other abnormalities of gait and mobility (R26.89);Muscle weakness (generalized) (M62.81);Other symptoms and signs involving cognitive function                Time: 9518-8416 OT Time Calculation (min): 14 min Charges:  OT General Charges $OT Visit: 1 Visit OT Evaluation $OT Eval Moderate Complexity: 1 Mod G-Codes:     [place] MSOT, OTR/L Acute Rehab Pager: [phone] Office: [place] 11/16/2017, 8:12 AM

## 2017-11-16 NOTE — Progress Notes (Signed)
Pt and wife refusing bed alarm since she will be spending the night. States she will call out for help when needed. Wife instructed not to get pt out of bed without staff present.

## 2017-11-16 NOTE — Care Management Note (Signed)
Case Management Note  Patient Details  Name: Darryl [name] MRN: [id number] Date of Birth: [birth year]  Subjective/Objective:   Sepsis                Action/Plan: Patient lives at home with spouse; PCP: [name], MD; has private insurance with Medicare/ BCBS with prescription drug coverage; pharmacy of choice [facility] and also mail order pharmacy through [employer]; DME - walker and cane at home; CM talked to patient and he gave CM permission to talk to his spouse [name]; TCT [name]- he is established with Nanine Means for [facility] services, she is providing 24 hr care for the patient at home; Hx of Parkinson Dz and he is to see a Neurologist soon; CM will continue to follow for progression of care.   Expected Discharge Date:    possibly 11/17/2017              Expected Discharge Plan:  [place]  Discharge planning Services  CM Consult  Choice offered to:  Spouse, Patient  HH Arranged:  PT, OT HH Agency:  [facility]  Status of Service:  In process, will continue to follow  Sherrilyn Rist [phone] 11/16/2017, 2:40 PM

## 2017-11-16 NOTE — Progress Notes (Signed)
Modified Barium Swallow Progress Note  Patient Details  Name: Darryl [name] MRN: [id number] Date of Birth: 1935-03-25  Today's Date: 11/16/2017  Modified Barium Swallow completed.  Full report located under Chart Review in the Imaging Section.  Brief recommendations include the following:  Clinical Impression  Pt exhibits moderate-severe primariliy motor impairments resulting in laryngeal penetration and aspiration of thin and nectar barium. Epiglottic deflection is mildly incomplete in addition to reduced anterior hyoid excursion for penetration and aspiration during initial swallow without effective sensation; weak throat clear once during multiple vocal cord level penetration. Penetrated material on the vocal cords was then aspirated at rest and/or during second swallow attempts. Chin tuck posture was ineffective and added to airway invasion. Mild vallecular residue. Nectar consistency was penetrated however at reduced frequency and depth of vestibule for majority (one instance to cords). No airway intrusion when using a straw with nectar. Oral phase was functional with minimal delayed mastication with solids. No visible impairments with brief esophageal scan. Educated pt and wife to inability to eliminate aspiration episodes with progressive nature of Parkinson's disease and risk will remain however diet modifications and additional strategies may lessen frequency. Plan to continue education prior to discharge tomorrow including highest risk will be at times of illness, thickening liquids etc. Recommend Dys 3 texture, nectar thick liquids, hard cough after liquids, pills whole in applesauce and he would benefit from [place].    Swallow Evaluation Recommendations       SLP Diet Recommendations: Dysphagia 3 (Mech soft) solids;Nectar thick liquid   Liquid Administration via: Cup;Straw   Medication Administration: Whole meds with puree   Supervision: Patient able to  self feed;Full supervision/cueing for compensatory strategies   Compensations: Minimize environmental distractions;Slow rate;Small sips/bites;Hard cough after swallow;Clear throat after each swallow   Postural Changes: Seated upright at 90 degrees   Oral Care Recommendations: Oral care BID        Houston Siren 11/16/2017,1:54 PM

## 2017-11-16 NOTE — Evaluation (Signed)
Physical Therapy Evaluation Patient Details Name: Darryl [name] MRN: [id number] DOB: 1934-12-29 Today's Date: 11/16/2017   History of Present Illness  83 y.o. male with admitted with weakness, fever and aspiration PNa. PMHx: agitated delirium, HLD, Parkinson's disease, CVA, GERD, hiatal hernia, depression, migraine, CKD-3, dCHF, CAD, CABG, ICH, L BBB, thrombocytopenia  Clinical Impression  Pt pleasant throughout session with baseline cognitive deficits and wife present throughout session. Pt with decreased safety, balance, gait and function with several LOB in the last year who will benefit from acute therapy to maximize mobility, function, independence and safety to decrease burden of care.      Follow Up Recommendations Home health PT;Supervision/Assistance - 24 hour    Equipment Recommendations  None recommended by PT    Recommendations for Other Services       Precautions / Restrictions Precautions Precautions: Fall      Mobility  Bed Mobility           General bed mobility comments: in chair on arrival  Transfers Overall transfer level: Needs assistance Equipment used: Rolling walker (2 wheeled) Transfers: Sit to/from Stand Sit to Stand: Supervision         General transfer comment: cues for hand placement and safety  Ambulation/Gait Ambulation/Gait assistance: Min guard Ambulation Distance (Feet): 250 Feet Assistive device: Rolling walker (2 wheeled) Gait Pattern/deviations: Step-through pattern;Decreased stride length;Trunk flexed   Gait velocity interpretation: 1.31 - 2.62 ft/sec, indicative of limited community ambulator General Gait Details: cues for posture and to step into RW, decrease speed  Stairs            Wheelchair Mobility    Modified Rankin (Stroke Patients Only)       Balance Overall balance assessment: Needs assistance;History of Falls   Sitting balance-Leahy Scale: Good       Standing balance-Leahy Scale:  Poor Standing balance comment: pt reaching for environmental support, use of RW with gait                             Pertinent Vitals/Pain Pain Assessment: 0-10 Pain Score: 5  Pain Location: head Pain Descriptors / Indicators: Aching Pain Intervention(s): Limited activity within patient's tolerance;Repositioned;Premedicated before session    Home Living Family/patient expects to be discharged to:: Private residence Living Arrangements: Spouse/significant other Available Help at Discharge: Family;Available 24 hours/day Type of Home: House Home Access: Stairs to enter Entrance Stairs-Rails: Left Entrance Stairs-Number of Steps: 3 Home Layout: One level Home Equipment: Environmental consultant - 2 wheels Additional Comments: Pt hasn't used AD but wife reports that he will use RW    Prior Function Level of Independence: Needs assistance      ADL's / Homemaking Assistance Needed: Supervision for bathing  Comments: Falls in past but not sure if in last year     Hand Dominance   Dominant Hand: Right    Extremity/Trunk Assessment   Upper Extremity Assessment Upper Extremity Assessment: Defer to OT evaluation    Lower Extremity Assessment Lower Extremity Assessment: Generalized weakness    Cervical / Trunk Assessment Cervical / Trunk Assessment: Kyphotic  Communication   Communication: Other (comment)(Decreased orientation)  Cognition Arousal/Alertness: Awake/alert Behavior During Therapy: WFL for tasks assessed/performed Overall Cognitive Status: History of cognitive impairments - at baseline                                 General Comments: pt stating  he is in [place], pleasantly confused      General Comments      Exercises     Assessment/Plan    PT Assessment Patient needs continued PT services  PT Problem List Decreased strength;Decreased mobility;Decreased safety awareness;Decreased activity tolerance;Decreased balance;Decreased knowledge of  use of DME;Decreased cognition       PT Treatment Interventions Gait training;Therapeutic exercise;Patient/family education;Functional mobility training;DME instruction;Therapeutic activities;Cognitive remediation;Balance training;Neuromuscular re-education    PT Goals (Current goals can be found in the Care Plan section)  Acute Rehab PT Goals Patient Stated Goal: return home PT Goal Formulation: With patient/family Time For Goal Achievement: 11/30/17 Potential to Achieve Goals: Good    Frequency Min 3X/week   Barriers to discharge        Co-evaluation               AM-PAC PT "6 Clicks" Daily Activity  Outcome Measure Difficulty turning over in bed (including adjusting bedclothes, sheets and blankets)?: A Little Difficulty moving from lying on back to sitting on the side of the bed? : A Little Difficulty sitting down on and standing up from a chair with arms (e.g., wheelchair, bedside commode, etc,.)?: A Little Help needed moving to and from a bed to chair (including a wheelchair)?: A Little Help needed walking in hospital room?: A Little Help needed climbing 3-5 steps with a railing? : A Lot 6 Click Score: 17    End of Session Equipment Utilized During Treatment: Gait belt Activity Tolerance: Patient tolerated treatment well Patient left: in chair;with call bell/phone within reach;with family/visitor present;with chair alarm set Nurse Communication: Mobility status PT Visit Diagnosis: Other abnormalities of gait and mobility (R26.89);Muscle weakness (generalized) (M62.81);History of falling (Z91.81)    Time: 7741-2878 PT Time Calculation (min) (ACUTE ONLY): 27 min   Charges:   PT Evaluation $PT Eval Moderate Complexity: 1 Mod PT Treatments $Gait Training: 8-22 mins   PT G Codes:        Elwyn Reach, PT [phone]   [place] 11/16/2017, 9:18 AM

## 2017-11-16 NOTE — Progress Notes (Signed)
PROGRESS NOTE  Darryl [name]  MOQ:[id number] DOB: [birth year] DOA: 11/14/2017 PCP: [name], MD  Outpatient Specialists: Neurology, Jaynee Eagles. Brief Narrative: Darryl [name] is an 83 y.o. male with a history of PD without treatment, stroke, CAD s/p CABG, chronic HFpEF, stage III CKD, GERD, HLD, depression, thrombocytopenia who presented to the ED with a few days of fatigue, weakness and found on the floor by his wife without the ability to get himself up. Diagnosed w/PD 3 years ago, not started Tx but wife notes functional decline over past year, supposed to use a walker but doesn't. Wife reported subjective tactile fever unmeasured and a few days of decreasing po intake. He denied any cardiac, respiratory, or urinary symptoms other than stable urinary urgency. Was treated for UTI last month. Tmax in ED was 100.56F, otherwise normal vital signs in ED with WBC 16.9, lactic acid normal. CXR negative and CT chest showed bibasilar linear opacities consistent with atelectasis with an outside chance of aspiration pneumonia though no endobronchial debris noted. Urine dipstick had no leukocyte esterase and ultimately negative culture. Zosyn was started for presumed aspiration pneumonia and the patient was admitted. SLP evaluation with formal swallow study demonstrated silent aspiration. Nectar thickened liquids and dysphagia 3 diet were recommended. The patient continues to feel weak but has been afebrile.   Assessment & Plan: Principal Problem:   Sepsis ([place]) Active Problems:   Benign essential HTN   LBBB (left bundle branch block)   Parkinson's disease ([place])   Stroke ([place])   Hx of CABG 2005, PCI 2010   CKD (chronic kidney disease), stage III ([facility])   Diastolic dysfunction   CAD (coronary artery disease), native coronary artery   Aspiration pneumonia ([facility])  Generalized weakness: Fell and couldn't get up at home, not down for long. Suspect infectious illness worsening untreated parkinson's  disease.  - PT/OT recommend 24 hours supervision which the patient's wife is attempting to arrange. Plan to continue PT and OT at home at discharge. - Treat infection as below  Aspiration pneumonia: Moderate aspiration risk and reportedly aspiration witnessed on swallow study. Flu negative, RVP negative.Had similar weakness, mental status change last month and had UTI but urine Cx negative.  - Will watch blood cultures closely.  - Continue empiric zosyn. PCT 0.16 not consistent with severe infection. Will trend in AM.  - Dysphagia 3 diet, nectar-thickened liquids. SLP to continue education of family and patient 5/22.  Parkinson's disease: Had side effects with initial treatment and has remained off therapy since that time. - Has been seeing Dr. Jaynee Eagles, though they are interested in seeing Dr. Carles Collet. They were encouraged to go ahead and call for initial appointment.  Agitated delirium:  - Counseled pt's wife on likelihood of this continuing with advanced age, baseline decreased cerebral reserve, infection. Will be present as much as possible.  - Delirium precautions - May need zyprexa 2.5mg  qHS (not ordered). QTc on telemetry 477msec. - Minimize restraints including catheters.  HTN: Above long term goal, but not severe - Continue home medications - IV hydralazine prn  Chronic HFpEF: Euvolemic on exam, BNP 194.  - Stop IV fluids, hold lasix in setting of diminished po intake - I/O, daily weights (stable at 178lbs)  CAD s/p CABG 2005, PCI 2010. No anginal complaints.  - Continue aspirin, coreg, prn NTG  Stage III CKD:  - Monitor - Avoid nephrotoxins.   Lung nodule: 70mm LUL on CT. - Follow up with PCP  DVT prophylaxis: Lovenox Code Status: Full Family  Communication: Wife at bedside Disposition Plan: Home if stable 5/22.  Consultants:   None  Procedures:   Swallow study 5/21, formal results pending.   Antimicrobials:  Zosyn 5/20 >>    Subjective: Worked with PT  and felt weaker than usual. Denies chest pain, dyspnea, fevers.   Objective: Vitals:   11/16/17 0500 11/16/17 0835 11/16/17 0855 11/16/17 1210  BP:  (!) 99/54 (!) 150/85 136/69  Pulse:  62 69 60  Resp:  18  20  Temp:  97.8 F (36.6 C)  97.8 F (36.6 C)  TempSrc:  Oral  Oral  SpO2:  100% 97% 95%  Weight: 80.9 kg (178 lb 5.6 oz)     Height:        Intake/Output Summary (Last 24 hours) at 11/16/2017 1314 Last data filed at 11/16/2017 0906 Gross per 24 hour  Intake 3280 ml  Output 0 ml  Net 3280 ml   Filed Weights   11/15/17 1124 11/16/17 0010 11/16/17 0500  Weight: 80.7 kg (178 lb) 80.9 kg (178 lb 6.4 oz) 80.9 kg (178 lb 5.6 oz)    Gen: Elderly male in no distress Pulm: Non-labored breathing room air. Some upper airway sounds transmitted. Clear to auscultation bilaterally.  CV: Regular rate and rhythm. No murmur, rub, or gallop. No JVD, no pedal edema. GI: Abdomen soft, non-tender, non-distended, with normoactive bowel sounds. No organomegaly or masses felt. Ext: Warm, no deformities Skin: No rashes, lesions no ulcers Neuro: Alert and oriented but forgetful of recent events. No focal neurological deficits. Psych: Judgement and insight appear normal. Mood euthymic, flattened facial affect.   Data Reviewed: I have personally reviewed following labs and imaging studies  CBC: Recent Labs  Lab 11/14/17 2030  WBC 16.9*  HGB 14.8  HCT 41.3  MCV 87.7  PLT 419*   Basic Metabolic Panel: Recent Labs  Lab 11/14/17 2030 11/16/17 0613  NA 141 139  K 4.1 3.6  CL 110 108  CO2 24 21*  GLUCOSE 147* 137*  BUN 18 14  CREATININE 1.43* 1.53*  CALCIUM 9.2 9.2   GFR: Estimated Creatinine Clearance: 38.5 mL/min (A) (by C-G formula based on SCr of 1.53 mg/dL (H)). Liver Function Tests: Recent Labs  Lab 11/14/17 2030  AST 23  ALT 20  ALKPHOS 89  BILITOT 0.8  PROT 6.6  ALBUMIN 3.8   No results for input(s): LIPASE, AMYLASE in the last 168 hours. No results for input(s):  AMMONIA in the last 168 hours. Coagulation Profile: No results for input(s): INR, PROTIME in the last 168 hours. Cardiac Enzymes: No results for input(s): CKTOTAL, CKMB, CKMBINDEX, TROPONINI in the last 168 hours. BNP (last 3 results) No results for input(s): PROBNP in the last 8760 hours. HbA1C: No results for input(s): HGBA1C in the last 72 hours. CBG: Recent Labs  Lab 11/14/17 2022  GLUCAP 149*   Lipid Profile: No results for input(s): CHOL, HDL, LDLCALC, TRIG, CHOLHDL, LDLDIRECT in the last 72 hours. Thyroid Function Tests: No results for input(s): TSH, T4TOTAL, FREET4, T3FREE, THYROIDAB in the last 72 hours. Anemia Panel: No results for input(s): VITAMINB12, FOLATE, FERRITIN, TIBC, IRON, RETICCTPCT in the last 72 hours. Urine analysis:    Component Value Date/Time   COLORURINE YELLOW 11/14/2017 2236   APPEARANCEUR CLEAR 11/14/2017 2236   LABSPEC 1.017 11/14/2017 2236   PHURINE 6.0 11/14/2017 2236   GLUCOSEU NEGATIVE 11/14/2017 2236   HGBUR NEGATIVE 11/14/2017 2236   BILIRUBINUR NEGATIVE 11/14/2017 2236   KETONESUR 5 (A) 11/14/2017 2236  PROTEINUR NEGATIVE 11/14/2017 2236   UROBILINOGEN 1.0 10/11/2014 1609   NITRITE NEGATIVE 11/14/2017 2236   LEUKOCYTESUR NEGATIVE 11/14/2017 2236   Recent Results (from the past 240 hour(s))  Culture, blood (Routine X 2) w Reflex to ID Panel     Status: None (Preliminary result)   Collection Time: 11/14/17 10:15 PM  Result Value Ref Range Status   Specimen Description BLOOD RIGHT ANTECUBITAL  Final   Special Requests   Final    BOTTLES DRAWN AEROBIC AND ANAEROBIC Blood Culture adequate volume   Culture   Final    NO GROWTH < 24 HOURS Performed at [facility], [place] [address], [place], [place] [address]    Report Status PENDING  Incomplete  Culture, blood (Routine X 2) w Reflex to ID Panel     Status: None (Preliminary result)   Collection Time: 11/14/17 10:15 PM  Result Value Ref Range Status   Specimen Description BLOOD  LEFT ANTECUBITAL  Final   Special Requests   Final    BOTTLES DRAWN AEROBIC AND ANAEROBIC Blood Culture adequate volume   Culture   Final    NO GROWTH < 24 HOURS Performed at [facility], [place] [address] [place], [place] [address]    Report Status PENDING  Incomplete  Urine Culture     Status: None   Collection Time: 11/14/17 10:36 PM  Result Value Ref Range Status   Specimen Description URINE, CLEAN CATCH  Final   Special Requests NONE  Final   Culture   Final    NO GROWTH Performed at [facility], [place] [address], [place], [place] [address]    Report Status 11/16/2017 FINAL  Final  Respiratory Panel by PCR     Status: None   Collection Time: 11/15/17 12:40 AM  Result Value Ref Range Status   Adenovirus NOT DETECTED NOT DETECTED Final   Coronavirus 229E NOT DETECTED NOT DETECTED Final   Coronavirus HKU1 NOT DETECTED NOT DETECTED Final   Coronavirus NL63 NOT DETECTED NOT DETECTED Final   Coronavirus OC43 NOT DETECTED NOT DETECTED Final   Metapneumovirus NOT DETECTED NOT DETECTED Final   Rhinovirus / Enterovirus NOT DETECTED NOT DETECTED Final   Influenza A NOT DETECTED NOT DETECTED Final   Influenza B NOT DETECTED NOT DETECTED Final   Parainfluenza Virus 1 NOT DETECTED NOT DETECTED Final   Parainfluenza Virus 2 NOT DETECTED NOT DETECTED Final   Parainfluenza Virus 3 NOT DETECTED NOT DETECTED Final   Parainfluenza Virus 4 NOT DETECTED NOT DETECTED Final   Respiratory Syncytial Virus NOT DETECTED NOT DETECTED Final   Bordetella pertussis NOT DETECTED NOT DETECTED Final   Chlamydophila pneumoniae NOT DETECTED NOT DETECTED Final   Mycoplasma pneumoniae NOT DETECTED NOT DETECTED Final    Comment: Performed at [facility], [place] [address], [place], [place] [address]      Radiology Studies: Dg Chest 2 View  Result Date: 11/14/2017 CLINICAL DATA:  Fall EXAM: CHEST - 2 VIEW COMPARISON:  10/08/2017 FINDINGS: Prior CABG. Heart and mediastinal contours are within  normal limits. No focal opacities or effusions. No acute bony abnormality. IMPRESSION: No active cardiopulmonary disease. Electronically Signed   By: Rolm Baptise M.D.   On: 11/14/2017 20:56   Ct Chest Wo Contrast  Result Date: 11/14/2017 CLINICAL DATA:  Chronic dyspnea.  Negative x-ray. EXAM: CT CHEST WITHOUT CONTRAST TECHNIQUE: Multidetector CT imaging of the chest was performed following the standard protocol without IV contrast. COMPARISON:  Radiographs  earlier this day. FINDINGS: Cardiovascular: Post CABG with calcification of the native coronary arteries. Mild atherosclerosis of the thoracic aorta which is normal in caliber. The heart is normal in size. Pulmonary arteries are normal in caliber. No pericardial effusion. Mediastinum/Nodes: No enlarged mediastinal lymph nodes. No evidence of hilar adenopathy allowing for lack contrast. Small left thyroid calcification does not meet size criteria for further evaluation. The esophagus is decompressed. Lungs/Pleura: Mild dependent ground-glass and linear opacities in both lower lobes. Mild lower lobe bronchial thickening. No confluent consolidation. 3 mm pulmonary nodule in the anterior left upper lobe image 62 series 4. Trachea and mainstem bronchi are patent. No pulmonary edema or pleural fluid. Upper Abdomen: Postcholecystectomy.  No acute finding. Musculoskeletal: Post median sternotomy. There are no acute or suspicious osseous abnormalities. IMPRESSION: 1. Mild dependent linear opacities within both lower lobes, typically seen with hypoventilatory atelectasis. Aspiration is considered given distribution, however felt less likely given lack of debris in the trachea and bronchi. 2. Post CABG with calcification of native coronary arteries. Mild aortic atherosclerosis. 3. Left upper lobe 3 mm pulmonary nodule. No follow-up needed if patient is low-risk. Non-contrast chest CT can be considered in 12 months if patient is high-risk. This recommendation follows the  consensus statement: Guidelines for Management of Incidental Pulmonary Nodules Detected on CT Images: From the Fleischner Society 2017; Radiology 2017; 284:228-243. Aortic Atherosclerosis (ICD10-I70.0). Electronically Signed   By: Jeb Levering M.D.   On: 11/14/2017 23:47    Scheduled Meds: . aspirin EC  81 mg Oral Daily  . carvedilol  6.25 mg Oral BID WC  . citalopram  20 mg Oral QHS  . cloNIDine  0.3 mg Oral BID  . enoxaparin (LOVENOX) injection  40 mg Subcutaneous Q24H  . hydrALAZINE  25 mg Oral TID  . irbesartan  300 mg Oral QHS  . pantoprazole  40 mg Oral Once per day on Mon Wed Fri   Continuous Infusions: . sodium chloride 75 mL/hr at 11/16/17 0510  . piperacillin-tazobactam (ZOSYN)  IV Stopped (11/16/17 0906)     LOS: 1 day   Time spent: 25 minutes.  Patrecia Pour, MD [facility] Pager [phone]  If 7PM-7AM, please contact night-coverage [email or web address] Password [email or web address] 11/16/2017, 1:14 PM

## 2017-11-17 ENCOUNTER — Other Ambulatory Visit: Payer: Self-pay

## 2017-11-17 DIAGNOSIS — A419 Sepsis, unspecified organism: Principal | ICD-10-CM

## 2017-11-17 DIAGNOSIS — J690 Pneumonitis due to inhalation of food and vomit: Secondary | ICD-10-CM

## 2017-11-17 DIAGNOSIS — I5189 Other ill-defined heart diseases: Secondary | ICD-10-CM

## 2017-11-17 DIAGNOSIS — R531 Weakness: Secondary | ICD-10-CM

## 2017-11-17 DIAGNOSIS — G20 Parkinson's disease: Secondary | ICD-10-CM

## 2017-11-17 DIAGNOSIS — N183 Chronic kidney disease, stage 3 (moderate): Secondary | ICD-10-CM

## 2017-11-17 LAB — PROCALCITONIN: Procalcitonin: <0.1 ng/mL

## 2017-11-17 LAB — BASIC METABOLIC PANEL
ANION GAP: 10 (ref 5–15)
BUN: 11 mg/dL (ref 6–20)
CALCIUM: 9 mg/dL (ref 8.9–10.3)
CO2: 23 mmol/L (ref 22–32)
CREATININE: 1.53 mg/dL — AB (ref 0.61–1.24)
Chloride: 108 mmol/L (ref 101–111)
GFR, EST AFRICAN AMERICAN: 48 mL/min — AB (ref >60)
GFR, EST NON AFRICAN AMERICAN: 41 mL/min — AB (ref >60)
Glucose, Bld: 120 mg/dL — ABNORMAL HIGH (ref 65–99)
Potassium: 3.2 mmol/L — ABNORMAL LOW (ref 3.5–5.1)
Sodium: 141 mmol/L (ref 135–145)

## 2017-11-17 MED ORDER — AMOXICILLIN-POT CLAVULANATE 875-125 MG PO TABS: 1.0000 | ORAL_TABLET | Freq: Two times a day (BID) | ORAL | 0 refills | Status: AC

## 2017-11-17 MED ORDER — RESOURCE THICKENUP CLEAR PO POWD: ORAL | 11 refills | Status: DC

## 2017-11-17 MED ORDER — DM-GUAIFENESIN ER 30-600 MG PO TB12: 1.0000 | ORAL_TABLET | Freq: Two times a day (BID) | ORAL | 0 refills | Status: DC | PRN

## 2017-11-17 MED ORDER — POTASSIUM CHLORIDE CRYS ER 20 MEQ PO TBCR
40.0000 meq | EXTENDED_RELEASE_TABLET | Freq: Once | ORAL | Status: AC
  Administered 2017-11-17: 40 meq via ORAL
  Filled 2017-11-17: qty 2

## 2017-11-17 NOTE — Progress Notes (Signed)
EKG done

## 2017-11-17 NOTE — Discharge Summary (Signed)
Physician Discharge Summary  Darryl [name] WNU:[id number] DOB: 26-Oct-1934 DOA: 11/14/2017  PCP: [name], MD  Admit date: 11/14/2017 Discharge date: 11/17/2017  Admitted From: Home.  Disposition: Home with Home health.   Recommendations for Outpatient Follow-up:  1. Follow up with PCP in 1-2 weeks 2. Please obtain BMP/CBC in one week 3. Please follow up with LUL nodule on CT .  4. Recommend follow up with neurologist as recommended in 1 to 2 weeks.    Home Health:yes   Discharge Condition:stable.  CODE STATUS: full code.  Diet recommendation: Heart Healthy  Brief/Interim Summary: Darryl [name] IH49 y.o.malewith a history of PD without treatment, stroke, CAD s/p CABG, chronic HFpEF, stage III CKD, GERD, HLD, depression, thrombocytopenia who presented to the ED with a few days of fatigue, weakness and found on the floor by his wife without the ability to get himself up. Diagnosed w/PD 3 years ago, not started Tx but wife notes functional decline over past year, supposed to use a walker but doesn't. Wife reported subjective tactile fever unmeasured and a few days of decreasing po intake.He denied any cardiac, respiratory, or urinary symptoms other than stable urinary urgency. Was treated for UTI last month. Tmax in ED was 100.44F, otherwise normal vital signs in ED with WBC 16.9, lactic acid normal. CXR negative and CT chest showed bibasilar linear opacities consistent with atelectasis with an outside chance of aspiration pneumonia though no endobronchial debris noted. Urine dipstick had no leukocyte esterase and ultimately negative culture. Zosyn was started for presumed aspiration pneumonia and the patient was admitted. SLP evaluation with formal swallow study demonstrated silent aspiration. Nectar thickened liquids and dysphagia 3 diet were recommended. The    Discharge Diagnoses:  Principal Problem:   Sepsis ([place]) Active Problems:   Benign essential HTN   LBBB (left  bundle branch block)   Parkinson's disease ([place])   Stroke ([place])   Hx of CABG 2005, PCI 2010   CKD (chronic kidney disease), stage III (HCC)   Diastolic dysfunction   CAD (coronary artery disease), native coronary artery   Aspiration pneumonia (HCC)  Generalized weakness: Fell and couldn't get up at home, not down for long. Suspect infectious illness worsening untreated parkinson's disease.  - PT/OT recommend 24 hours supervision which the patient's wife is attempting to arrange. Plan to continue PT and OT at home at discharge.   Aspiration pneumonia: Moderate aspiration risk and reportedly aspiration witnessed on swallow study. Flu negative, RVP negative.Had similar weakness, mental status change last month and had UTI but urine Cx negative.  Blood cultures negative. Received 3 days of ZOSYN, recommend to continue with oral antibiotics on discharge.  - Dysphagia 3 diet, nectar-thickened liquids. SLP to continue education of family and patient 5/22.  Parkinson's disease: Had side effects with initial treatment and has remained off therapy since that time. - Has been seeing Dr. Jaynee Eagles, though they are interested in seeing Dr. Carles Collet. They were encouraged to go ahead and call for initial appointment.  Agitated delirium:  - Counseled pt's wife on likelihood of this continuing with advanced age, baseline decreased cerebral reserve, infection. Will be present as much as possible.    HTN: Above long term goal,. - Continue home medications   Chronic HFpEF: Euvolemic on exam, BNP 194.  Resume home meds.   CAD s/p CABG 2005, PCI 2010. No anginal complaints.  - Continue aspirin, coreg, prn NTG  Stage III CKD:  - Monitor - Avoid nephrotoxins.   Lung nodule: 16mm  LUL on CT. - Follow up with PCP  Hypokalemia replaced.     Discharge Instructions  Discharge Instructions    Diet - low sodium heart healthy   Complete by:  As directed    Discharge instructions   Complete by:  As  directed    Please follow up with PCP in one week.     Allergies as of 11/17/2017      Reactions   Ambien [zolpidem Tartrate] Other (See Comments)   hallucinations   Aspirin Other (See Comments)   ulcers   Celebrex [celecoxib] Other (See Comments)   Due to ulcer.   Nsaids Other (See Comments)   Due to ulcer.   Toprol Xl [metoprolol Tartrate] Swelling   Altace [ramipril]    UNKNOWN   Amlodipine    Feet swell   Levothyroxine Other (See Comments)   "made him feel bad"   Biaxin [clarithromycin] Nausea Only   Tetracyclines & Related Nausea Only      Medication List    TAKE these medications   acetaminophen 500 MG tablet Commonly known as:  TYLENOL Take 1,000 mg by mouth every 8 (eight) hours as needed for mild pain or moderate pain.   amoxicillin-clavulanate 875-125 MG tablet Commonly known as:  AUGMENTIN Take 1 tablet by mouth every 12 (twelve) hours for 5 days.   aspirin EC 81 MG tablet Take 81 mg by mouth. Patient takes 3 times a week   carvedilol 6.25 MG tablet Commonly known as:  COREG Take 1 tablet (6.25 mg total) by mouth 2 (two) times daily.   citalopram 20 MG tablet Commonly known as:  CELEXA Take 20 mg by mouth at bedtime.   cloNIDine 0.3 MG tablet Commonly known as:  CATAPRES Take 0.3 mg by mouth 2 (two) times daily.   dextromethorphan-guaiFENesin 30-600 MG 12hr tablet Commonly known as:  MUCINEX DM Take 1 tablet by mouth 2 (two) times daily as needed for cough.   furosemide 20 MG tablet Commonly known as:  LASIX Take 1 tablet (20 mg total) by mouth daily.   hydrALAZINE 25 MG tablet Commonly known as:  APRESOLINE Take 1 tablet (25 mg total) by mouth 3 (three) times daily.   irbesartan 300 MG tablet Commonly known as:  AVAPRO Take 300 mg by mouth at bedtime.   KLOR-CON 10 10 MEQ tablet Generic drug:  potassium chloride Take 1 tablet (10 mEq total) by mouth daily.   nitroGLYCERIN 0.4 MG SL tablet Commonly known as:  NITROSTAT Place 1 tablet  (0.4 mg total) under the tongue every 5 (five) minutes as needed for chest pain.   pantoprazole 40 MG tablet Commonly known as:  PROTONIX Take 40 mg by mouth 3 (three) times a week.   RESOURCE THICKENUP CLEAR Powd As needed with meals.   traMADol 50 MG tablet Commonly known as:  ULTRAM Take 50 mg by mouth daily as needed (pain).      Follow-up Information    [place].   Specialty:  [place] Why:  They will continue to do your home health care at your home Contact information: [place] [address] [phone]        [name], MD. Schedule an appointment as soon as possible for a visit in 1 week(s).   Specialty:  Family Medicine Contact information: [place] [address] [phone]          Allergies  Allergen Reactions  .  Ambien [Zolpidem Tartrate] Other (See Comments)    hallucinations  . Aspirin Other (See Comments)    ulcers  . Celebrex [Celecoxib] Other (See Comments)    Due to ulcer.  . Nsaids Other (See Comments)    Due to ulcer.  . Toprol Xl [Metoprolol Tartrate] Swelling  . Altace [Ramipril]     UNKNOWN  . Amlodipine     Feet swell  . Levothyroxine Other (See Comments)    "made him feel bad"  . Biaxin [Clarithromycin] Nausea Only  . Tetracyclines & Related Nausea Only    Consultations:  None.    Procedures/Studies: Dg Chest 2 View  Result Date: 11/14/2017 CLINICAL DATA:  Fall EXAM: CHEST - 2 VIEW COMPARISON:  10/08/2017 FINDINGS: Prior CABG. Heart and mediastinal contours are within normal limits. No focal opacities or effusions. No acute bony abnormality. IMPRESSION: No active cardiopulmonary disease. Electronically Signed   By: Rolm Baptise M.D.   On: 11/14/2017 20:56   Ct Chest Wo Contrast  Result Date: 11/14/2017 CLINICAL DATA:  Chronic dyspnea.  Negative x-ray. EXAM: CT CHEST WITHOUT CONTRAST TECHNIQUE: Multidetector CT imaging of the  chest was performed following the standard protocol without IV contrast. COMPARISON:  Radiographs earlier this day. FINDINGS: Cardiovascular: Post CABG with calcification of the native coronary arteries. Mild atherosclerosis of the thoracic aorta which is normal in caliber. The heart is normal in size. Pulmonary arteries are normal in caliber. No pericardial effusion. Mediastinum/Nodes: No enlarged mediastinal lymph nodes. No evidence of hilar adenopathy allowing for lack contrast. Small left thyroid calcification does not meet size criteria for further evaluation. The esophagus is decompressed. Lungs/Pleura: Mild dependent ground-glass and linear opacities in both lower lobes. Mild lower lobe bronchial thickening. No confluent consolidation. 3 mm pulmonary nodule in the anterior left upper lobe image 62 series 4. Trachea and mainstem bronchi are patent. No pulmonary edema or pleural fluid. Upper Abdomen: Postcholecystectomy.  No acute finding. Musculoskeletal: Post median sternotomy. There are no acute or suspicious osseous abnormalities. IMPRESSION: 1. Mild dependent linear opacities within both lower lobes, typically seen with hypoventilatory atelectasis. Aspiration is considered given distribution, however felt less likely given lack of debris in the trachea and bronchi. 2. Post CABG with calcification of native coronary arteries. Mild aortic atherosclerosis. 3. Left upper lobe 3 mm pulmonary nodule. No follow-up needed if patient is low-risk. Non-contrast chest CT can be considered in 12 months if patient is high-risk. This recommendation follows the consensus statement: Guidelines for Management of Incidental Pulmonary Nodules Detected on CT Images: From the Fleischner Society 2017; Radiology 2017; 284:228-243. Aortic Atherosclerosis (ICD10-I70.0). Electronically Signed   By: Jeb Levering M.D.   On: 11/14/2017 23:47   Dg Swallowing Func-speech Pathology  Result Date: 11/16/2017 Objective Swallowing  Evaluation: Type of Study: MBS-Modified Barium Swallow Study  Patient Details Name: Darryl [name] MRN: [id number] Date of Birth: 10/30/1934 Today's Date: 11/16/2017 Time: SLP Start Time (ACUTE ONLY): 1050 -SLP Stop Time (ACUTE ONLY): 1108 SLP Time Calculation (min) (ACUTE ONLY): 18 min Past Medical History: Past Medical History: Diagnosis Date . Arthritis  . BPH (benign prostatic hypertrophy) with urinary retention  . Chronic diastolic CHF (congestive heart failure) ([place])  . Colon polyp  . Coronary atherosclerosis of native coronary artery   a. s/p CABG 11/2003. b. s/p DES to radial-OM and SVG-RCA 11/05. c. s/p BMS to LCx 11/2008. . Dilated aortic root ([place]) 05/29/2014  78mm by echo 04/2017 . Diverticulosis  . Dyslipidemia   statin intolerant . Elevated fasting glucose  .  Gastritis   a. by EGD 04/2004. . H/O urinary retention   post surgical procedures; has foley cath . Headache(784.0)  . Hiatal hernia  . HOH (hard of hearing)   slight hoh, rt>l . HTN (hypertension)  . Hypertensive heart disease  . Insomnia  . Intracranial hemorrhage ([place])   a. small pontine [place] 2003. [name] LBBB (left bundle branch block)  . Lumbar spondylolysis   multilevel DJD with mild spinal stenosis L3-L5 . Migraines  . Parkinson's disease ([place])  . Peptic ulcer disease   a. prior hx of bleeding ulcers, including 12/2003 in setting of aspirin use. . Renal insufficiency   renal duplex scan in 2012 showed no renal artery stenosis . Sleep disturbance  . Stroke [facility])   a. remote hx of stroke reportedly r/t HTN. [name] Thrombocytopenia ([place])  . Tremor  . UTI (urinary tract infection)  . Wears glasses  Past Surgical History: Past Surgical History: Procedure Laterality Date . APPENDECTOMY  60  1963 . CHOLECYSTECTOMY  1998 . COLON SURGERY  03/14/2008 . COLONOSCOPY  10/2001 . CORONARY ANGIOPLASTY  6/10,10/05  Dr. [name] cardiologist: last office visit 11/07/14 . CORONARY ARTERY BYPASS GRAFT  2005 . CYSTOSCOPY WITH LITHOLAPAXY N/A 11/19/2014  Procedure:  CYSTOSCOPY WITH LITHOLAPAXY;  Surgeon: [name], MD;  Location: [facility];  Service: Urology;  Laterality: N/A; . ESOPHAGOGASTRODUODENOSCOPY    10/2001, 12/2003, 04/2004 . LUMBAR LAMINECTOMY/DECOMPRESSION MICRODISCECTOMY N/A 05/03/2013  Procedure: LUMBAR LAMINECTOMY/DCOMPRESSION MICRODISCECTOMY RIGHT LUMBAR FOUR FIVE;  Surgeon: [name], MD;  Location: [facility];  Service: Neurosurgery;  Laterality: N/A; . SHOULDER ARTHROSCOPY Left 9/10 . stones    hx . TRANSURETHRAL RESECTION OF PROSTATE N/A 11/19/2014  Procedure: TRANSURETHRAL RESECTION OF THE PROSTATE WITH GYRUS INSTRUMENTS;  Surgeon: [name], MD;  Location: [facility];  Service: Urology;  Laterality: N/A; . WISDOM TOOTH EXTRACTION   HPI: Darryl [name] is a 83 y.o. male with medical history significant of Parkinson's disease, hyperlipidemia, stroke, GERD, hiatal hernia, gastritis, depression, migraine headaches, CKD-3, dCHF, CAD, CABG, dilated aortic root, intracranial hemorrhage, left bundle blockage, thrombocytopenia, who presents with generalized weakness and fever. CXR negative but CT angiogram of chest showed bilateral lower lobe linea opacity concerning for aspiration, also showed 3 mm of left upper lobe nodule. MD treating for aspiration pna; has had agitated delirium.  No data recorded Assessment / Plan / Recommendation CHL IP CLINICAL IMPRESSIONS 11/16/2017 Clinical Impression Pt exhibits moderate-severe primariliy motor impairments resulting in laryngeal penetration and aspiration of thin and nectar barium. Epiglottic deflection is mildly incomplete in addition to reduced anterior hyoid excursion for penetration and aspiration during initial swallow without effective sensation; weak throat clear once during multiple vocal cord level penetration. Penetrated material on the vocal cords was then aspirated at rest and/or during second swallow attempts. Chin tuck posture was ineffective and added to airway  invasion. Mild vallecular residue. Nectar consistency was penetrated however at reduced frequency and depth of vestibule for majority (one instance to cords). No airway intrusion when using a straw with nectar. Oral phase was functional with minimal delayed mastication with solids. No visible impairments with brief esophageal scan. Educated pt and wife to inability to eliminate aspiration episodes with progressive nature of Parkinson's disease and risk will remain however diet modifications and additional strategies may lessen frequency. Plan to continue education prior to discharge tomorrow including highest risk will be at times of illness, thickening liquids etc. Recommend Dys 3 texture, nectar thick liquids, hard cough after  liquids, pills whole in applesauce and he would benefit from [place].  SLP Visit Diagnosis Dysphagia, pharyngeal phase (R13.13) Attention and concentration deficit following -- Frontal lobe and executive function deficit following -- Impact on safety and function Moderate aspiration risk   CHL IP TREATMENT RECOMMENDATION 11/16/2017 Treatment Recommendations Therapy as outlined in treatment plan below   Prognosis 11/16/2017 Prognosis for Safe Diet Advancement (No Data) Barriers to Reach Goals -- Barriers/Prognosis Comment -- CHL IP DIET RECOMMENDATION 11/16/2017 SLP Diet Recommendations Dysphagia 3 (Mech soft) solids;Nectar thick liquid Liquid Administration via Cup;Straw Medication Administration Whole meds with puree Compensations Minimize environmental distractions;Slow rate;Small sips/bites;Hard cough after swallow;Clear throat after each swallow Postural Changes Seated upright at 90 degrees   CHL IP OTHER RECOMMENDATIONS 11/16/2017 Recommended Consults -- Oral Care Recommendations Oral care BID Other Recommendations --   CHL IP FOLLOW UP RECOMMENDATIONS 11/16/2017 Follow up Recommendations Home health SLP   CHL IP FREQUENCY AND DURATION 11/16/2017 Speech Therapy Frequency  (ACUTE ONLY) min 2x/week Treatment Duration 2 weeks      CHL IP ORAL PHASE 11/16/2017 Oral Phase Impaired Oral - Pudding Teaspoon -- Oral - Pudding Cup -- Oral - Honey Teaspoon -- Oral - Honey Cup -- Oral - Nectar Teaspoon -- Oral - Nectar Cup WFL Oral - Nectar Straw -- Oral - Thin Teaspoon -- Oral - Thin Cup WFL Oral - Thin Straw -- Oral - Puree -- Oral - Mech Soft -- Oral - Regular Delayed oral transit Oral - Multi-Consistency -- Oral - Pill -- Oral Phase - Comment --  CHL IP PHARYNGEAL PHASE 11/16/2017 Pharyngeal Phase Impaired Pharyngeal- Pudding Teaspoon -- Pharyngeal -- Pharyngeal- Pudding Cup -- Pharyngeal -- Pharyngeal- Honey Teaspoon -- Pharyngeal -- Pharyngeal- Honey Cup -- Pharyngeal -- Pharyngeal- Nectar Teaspoon -- Pharyngeal -- Pharyngeal- Nectar Cup Penetration/Apiration after swallow;Pharyngeal residue - valleculae Pharyngeal Material enters airway, passes BELOW cords without attempt by patient to eject out (silent aspiration);Material enters airway, CONTACTS cords and not ejected out;Material enters airway, remains ABOVE vocal cords and not ejected out Pharyngeal- Nectar Straw Pharyngeal residue - valleculae Pharyngeal -- Pharyngeal- Thin Teaspoon -- Pharyngeal -- Pharyngeal- Thin Cup Penetration/Aspiration during swallow;Reduced epiglottic inversion;Reduced anterior laryngeal mobility;Compensatory strategies attempted (with notebox) Pharyngeal Material enters airway, passes BELOW cords without attempt by patient to eject out (silent aspiration);Material enters airway, CONTACTS cords and not ejected out Pharyngeal- Thin Straw -- Pharyngeal -- Pharyngeal- Puree -- Pharyngeal -- Pharyngeal- Mechanical Soft -- Pharyngeal -- Pharyngeal- Regular Pharyngeal residue - valleculae Pharyngeal -- Pharyngeal- Multi-consistency -- Pharyngeal -- Pharyngeal- Pill -- Pharyngeal -- Pharyngeal Comment --  CHL IP CERVICAL ESOPHAGEAL PHASE 11/16/2017 Cervical Esophageal Phase WFL Pudding Teaspoon -- Pudding Cup -- Honey  Teaspoon -- Honey Cup -- Nectar Teaspoon -- Nectar Cup -- Nectar Straw -- Thin Teaspoon -- Thin Cup -- Thin Straw -- Puree -- Mechanical Soft -- Regular -- Multi-consistency -- Pill -- Cervical Esophageal Comment -- No flowsheet data found. [place] Siren 11/16/2017, 1:54 PM Orbie Pyo Colvin Caroli.Ed CCC-SLP Pager [phone]                  Subjective: No new complaints of chest pain , sob or palpitations, no nausea ,or vomiting.   Discharge Exam: Vitals:   11/16/17 2059 11/17/17 0502  BP: (!) 162/78 (!) 117/59  Pulse: (!) 59 61  Resp: 14 18  Temp: 98.1 F (36.7 C) 97.9 F (36.6 C)  SpO2: 98% 98%   Vitals:   11/16/17 1210 11/16/17 1630 11/16/17 2059 11/17/17  0502  BP: 136/69 (!) 175/83 (!) 162/78 (!) 117/59  Pulse: 60 62 (!) 59 61  Resp: 20 18 14 18   Temp: 97.8 F (36.6 C)  98.1 F (36.7 C) 97.9 F (36.6 C)  TempSrc: Oral  Oral Oral  SpO2: 95% 98% 98% 98%  Weight:    80.2 kg (176 lb 12.9 oz)  Height:        General: Pt is alert, awake, not in acute distress Cardiovascular: RRR, S1/S2 +,  Respiratory: CTA bilaterally, no wheezing, no rhonchi Abdominal: Soft, NT, ND, bowel sounds + Extremities: no edema, no cyanosis    The results of significant diagnostics from this hospitalization (including imaging, microbiology, ancillary and laboratory) are listed below for reference.     Microbiology: Recent Results (from the past 240 hour(s))  Culture, blood (Routine X 2) w Reflex to ID Panel     Status: None (Preliminary result)   Collection Time: 11/14/17 10:15 PM  Result Value Ref Range Status   Specimen Description BLOOD RIGHT ANTECUBITAL  Final   Special Requests   Final    BOTTLES DRAWN AEROBIC AND ANAEROBIC Blood Culture adequate volume   Culture   Final    NO GROWTH 2 DAYS Performed at [facility], [address], [place], [place] [address]    Report Status PENDING  Incomplete  Culture, blood (Routine X 2) w Reflex to ID Panel     Status: None  (Preliminary result)   Collection Time: 11/14/17 10:15 PM  Result Value Ref Range Status   Specimen Description BLOOD LEFT ANTECUBITAL  Final   Special Requests   Final    BOTTLES DRAWN AEROBIC AND ANAEROBIC Blood Culture adequate volume   Culture   Final    NO GROWTH 2 DAYS Performed at [facility], [place] [address], [place], [place] [address]    Report Status PENDING  Incomplete  Urine Culture     Status: None   Collection Time: 11/14/17 10:36 PM  Result Value Ref Range Status   Specimen Description URINE, CLEAN CATCH  Final   Special Requests NONE  Final   Culture   Final    NO GROWTH Performed at [facility], [place] [address], [place], [place] [address]    Report Status 11/16/2017 FINAL  Final  Respiratory Panel by PCR     Status: None   Collection Time: 11/15/17 12:40 AM  Result Value Ref Range Status   Adenovirus NOT DETECTED NOT DETECTED Final   Coronavirus 229E NOT DETECTED NOT DETECTED Final   Coronavirus HKU1 NOT DETECTED NOT DETECTED Final   Coronavirus NL63 NOT DETECTED NOT DETECTED Final   Coronavirus OC43 NOT DETECTED NOT DETECTED Final   Metapneumovirus NOT DETECTED NOT DETECTED Final   Rhinovirus / Enterovirus NOT DETECTED NOT DETECTED Final   Influenza A NOT DETECTED NOT DETECTED Final   Influenza B NOT DETECTED NOT DETECTED Final   Parainfluenza Virus 1 NOT DETECTED NOT DETECTED Final   Parainfluenza Virus 2 NOT DETECTED NOT DETECTED Final   Parainfluenza Virus 3 NOT DETECTED NOT DETECTED Final   Parainfluenza Virus 4 NOT DETECTED NOT DETECTED Final   Respiratory Syncytial Virus NOT DETECTED NOT DETECTED Final   Bordetella pertussis NOT DETECTED NOT DETECTED Final   Chlamydophila pneumoniae NOT DETECTED NOT DETECTED Final   Mycoplasma pneumoniae NOT DETECTED NOT DETECTED Final    Comment: Performed at [facility], [place] [address], [place], [place] [address]     Labs:  BNP (last 3 results) Recent Labs    11/15/17 0231  BNP 194.5*    Basic Metabolic Panel: Recent Labs  Lab 11/14/17 2030 11/16/17 0613 11/17/17 0520  NA 141 139 141  K 4.1 3.6 3.2*  CL 110 108 108  CO2 24 21* 23  GLUCOSE 147* 137* 120*  BUN 18 14 11   CREATININE 1.43* 1.53* 1.53*  CALCIUM 9.2 9.2 9.0   Liver Function Tests: Recent Labs  Lab 11/14/17 2030  AST 23  ALT 20  ALKPHOS 89  BILITOT 0.8  PROT 6.6  ALBUMIN 3.8   No results for input(s): LIPASE, AMYLASE in the last 168 hours. No results for input(s): AMMONIA in the last 168 hours. CBC: Recent Labs  Lab 11/14/17 2030  WBC 16.9*  HGB 14.8  HCT 41.3  MCV 87.7  PLT 129*   Cardiac Enzymes: No results for input(s): CKTOTAL, CKMB, CKMBINDEX, TROPONINI in the last 168 hours. BNP: Invalid input(s): POCBNP CBG: Recent Labs  Lab 11/14/17 2022  GLUCAP 149*   D-Dimer No results for input(s): DDIMER in the last 72 hours. Hgb A1c No results for input(s): HGBA1C in the last 72 hours. Lipid Profile No results for input(s): CHOL, HDL, LDLCALC, TRIG, CHOLHDL, LDLDIRECT in the last 72 hours. Thyroid function studies No results for input(s): TSH, T4TOTAL, T3FREE, THYROIDAB in the last 72 hours.  Invalid input(s): FREET3 Anemia work up No results for input(s): VITAMINB12, FOLATE, FERRITIN, TIBC, IRON, RETICCTPCT in the last 72 hours. Urinalysis    Component Value Date/Time   COLORURINE YELLOW 11/14/2017 2236   APPEARANCEUR CLEAR 11/14/2017 2236   LABSPEC 1.017 11/14/2017 2236   PHURINE 6.0 11/14/2017 2236   GLUCOSEU NEGATIVE 11/14/2017 2236   HGBUR NEGATIVE 11/14/2017 2236   BILIRUBINUR NEGATIVE 11/14/2017 2236   KETONESUR 5 (A) 11/14/2017 2236   PROTEINUR NEGATIVE 11/14/2017 2236   UROBILINOGEN 1.0 10/11/2014 1609   NITRITE NEGATIVE 11/14/2017 2236   LEUKOCYTESUR NEGATIVE 11/14/2017 2236   Sepsis Labs Invalid input(s): PROCALCITONIN,  WBC,  LACTICIDVEN Microbiology Recent Results (from the past 240 hour(s))  Culture, blood (Routine X 2) w Reflex to ID Panel      Status: None (Preliminary result)   Collection Time: 11/14/17 10:15 PM  Result Value Ref Range Status   Specimen Description BLOOD RIGHT ANTECUBITAL  Final   Special Requests   Final    BOTTLES DRAWN AEROBIC AND ANAEROBIC Blood Culture adequate volume   Culture   Final    NO GROWTH 2 DAYS Performed at [facility], [place] [address] [place], [place] [address]    Report Status PENDING  Incomplete  Culture, blood (Routine X 2) w Reflex to ID Panel     Status: None (Preliminary result)   Collection Time: 11/14/17 10:15 PM  Result Value Ref Range Status   Specimen Description BLOOD LEFT ANTECUBITAL  Final   Special Requests   Final    BOTTLES DRAWN AEROBIC AND ANAEROBIC Blood Culture adequate volume   Culture   Final    NO GROWTH 2 DAYS Performed at [facility], [place] [address], [place], [place] [address]    Report Status PENDING  Incomplete  Urine Culture     Status: None   Collection Time: 11/14/17 10:36 PM  Result Value Ref Range Status   Specimen Description URINE, CLEAN CATCH  Final   Special Requests NONE  Final   Culture   Final    NO GROWTH Performed at [facility], [place]  [address], [place], [place] [address]    Report Status 11/16/2017 FINAL  Final  Respiratory Panel by PCR     Status: None   Collection Time: 11/15/17 12:40 AM  Result Value Ref Range Status   Adenovirus NOT DETECTED NOT DETECTED Final   Coronavirus 229E NOT DETECTED NOT DETECTED Final   Coronavirus HKU1 NOT DETECTED NOT DETECTED Final   Coronavirus NL63 NOT DETECTED NOT DETECTED Final   Coronavirus OC43 NOT DETECTED NOT DETECTED Final   Metapneumovirus NOT DETECTED NOT DETECTED Final   Rhinovirus / Enterovirus NOT DETECTED NOT DETECTED Final   Influenza A NOT DETECTED NOT DETECTED Final   Influenza B NOT DETECTED NOT DETECTED Final   Parainfluenza Virus 1 NOT DETECTED NOT DETECTED Final   Parainfluenza Virus 2 NOT DETECTED NOT DETECTED Final   Parainfluenza Virus 3 NOT DETECTED NOT  DETECTED Final   Parainfluenza Virus 4 NOT DETECTED NOT DETECTED Final   Respiratory Syncytial Virus NOT DETECTED NOT DETECTED Final   Bordetella pertussis NOT DETECTED NOT DETECTED Final   Chlamydophila pneumoniae NOT DETECTED NOT DETECTED Final   Mycoplasma pneumoniae NOT DETECTED NOT DETECTED Final    Comment: Performed at [facility], [place] [address] [place], [place] [address]     Time coordinating discharge: 35 minutes  SIGNED:   Hosie Poisson, MD  [facility] 11/17/2017, 8:16 AM Pager   If 7PM-7AM, please contact night-coverage [email or web address] Password [email or web address]

## 2017-11-17 NOTE — Progress Notes (Addendum)
  Speech Language Pathology Treatment: Dysphagia  Patient Details Name: Darryl [name] MRN: [id number] DOB: Dec 26, 1934 Today's Date: 11/17/2017 Time: 1740-8144 SLP Time Calculation (min) (ACUTE ONLY): 26 min  Assessment / Plan / Recommendation Clinical Impression  Education provided to pt and wife with specifics related to dysphagia. Wife observed this therapist mixing nectar liquid and provided feedback on drink she mixed prior. Answered lots of question re: appropriate food textures, prognosis, further therapy (home health etc). Educated on the water protocol with verbal and written instructions. Discussed progressive nature of  Parkinson's, signs of pna and chronic nature of pt's dysphagia. He will be at increased risk of aspirating during future illnesses/hospitalizations and to have increased awareness and adherence to precautions. Educated re: the importance of and clinical reasoning for effective and frequent oral care. Recommend continue nectar thick liquids, regular textures cutting meats if needed, caution with mixed consistencies etc. Recommend Home Health Speech therapy. ST to sign off in hospital.    HPI HPI: Darryl [name] is a 83 y.o. male with medical history significant of Parkinson's disease, hyperlipidemia, stroke, GERD, hiatal hernia, gastritis, depression, migraine headaches, CKD-3, dCHF, CAD, CABG, dilated aortic root, intracranial hemorrhage, left bundle blockage, thrombocytopenia, who presents with generalized weakness and fever. CXR negative but CT angiogram of chest showed bilateral lower lobe linea opacity concerning for aspiration, also showed 3 mm of left upper lobe nodule. MD treating for aspiration pna; has had agitated delirium.      SLP Plan  All goals met       Recommendations  Diet recommendations: Dysphagia 3 (mechanical soft);Nectar-thick liquid Liquids provided via: Cup;No straw Medication Administration: Whole meds with puree Supervision: Patient  able to self feed;Full supervision/cueing for compensatory strategies Compensations: Minimize environmental distractions;Slow rate;Small sips/bites;Hard cough after swallow;Clear throat after each swallow Postural Changes and/or Swallow Maneuvers: Seated upright 90 degrees                Oral Care Recommendations: Oral care BID Follow up Recommendations: Home health SLP SLP Visit Diagnosis: Dysphagia, pharyngeal phase (R13.13) Plan: All goals met       GO                Houston Siren 11/17/2017, 9:47 AM  Orbie Pyo Colvin Caroli.Ed [employer] [phone]

## 2017-11-17 NOTE — Progress Notes (Signed)
Discharge instructions given. IV and tele removed. Wife given instructions questions answered.

## 2017-11-19 ENCOUNTER — Encounter: Payer: Self-pay | Admitting: Neurology

## 2017-11-19 DIAGNOSIS — M5410 Radiculopathy, site unspecified: Secondary | ICD-10-CM | POA: Diagnosis not present

## 2017-11-19 DIAGNOSIS — G20 Parkinson's disease: Secondary | ICD-10-CM | POA: Diagnosis not present

## 2017-11-19 DIAGNOSIS — F0280 Dementia in other diseases classified elsewhere without behavioral disturbance: Secondary | ICD-10-CM | POA: Diagnosis not present

## 2017-11-19 DIAGNOSIS — I1310 Hypertensive heart and chronic kidney disease without heart failure, with stage 1 through stage 4 chronic kidney disease, or unspecified chronic kidney disease: Secondary | ICD-10-CM | POA: Diagnosis not present

## 2017-11-19 DIAGNOSIS — I2510 Atherosclerotic heart disease of native coronary artery without angina pectoris: Secondary | ICD-10-CM | POA: Diagnosis not present

## 2017-11-19 DIAGNOSIS — M48061 Spinal stenosis, lumbar region without neurogenic claudication: Secondary | ICD-10-CM | POA: Diagnosis not present

## 2017-11-19 LAB — CULTURE, BLOOD (ROUTINE X 2)
Culture: NO GROWTH
Culture: NO GROWTH
SPECIAL REQUESTS: ADEQUATE
SPECIAL REQUESTS: ADEQUATE

## 2017-11-24 NOTE — Telephone Encounter (Signed)
Spoke with Sonia Baller. They started seeing pt on 11/19/17. Plan is to see pt 1x per week for 1 week, then 2x per week for 6 weeks, then 1x for last visit for d/c planning. Verbal order given. She verbalized appreciation.

## 2017-11-25 ENCOUNTER — Telehealth: Payer: Self-pay | Admitting: Neurology

## 2017-11-25 DIAGNOSIS — I1310 Hypertensive heart and chronic kidney disease without heart failure, with stage 1 through stage 4 chronic kidney disease, or unspecified chronic kidney disease: Secondary | ICD-10-CM | POA: Diagnosis not present

## 2017-11-25 DIAGNOSIS — R911 Solitary pulmonary nodule: Secondary | ICD-10-CM | POA: Diagnosis not present

## 2017-11-25 DIAGNOSIS — J690 Pneumonitis due to inhalation of food and vomit: Secondary | ICD-10-CM | POA: Diagnosis not present

## 2017-11-25 DIAGNOSIS — A419 Sepsis, unspecified organism: Secondary | ICD-10-CM | POA: Diagnosis not present

## 2017-11-25 DIAGNOSIS — I2510 Atherosclerotic heart disease of native coronary artery without angina pectoris: Secondary | ICD-10-CM | POA: Diagnosis not present

## 2017-11-25 DIAGNOSIS — G20 Parkinson's disease: Secondary | ICD-10-CM | POA: Diagnosis not present

## 2017-11-25 DIAGNOSIS — M5410 Radiculopathy, site unspecified: Secondary | ICD-10-CM | POA: Diagnosis not present

## 2017-11-25 DIAGNOSIS — Z6827 Body mass index (BMI) 27.0-27.9, adult: Secondary | ICD-10-CM | POA: Diagnosis not present

## 2017-11-25 DIAGNOSIS — M48061 Spinal stenosis, lumbar region without neurogenic claudication: Secondary | ICD-10-CM | POA: Diagnosis not present

## 2017-11-25 DIAGNOSIS — F0280 Dementia in other diseases classified elsewhere without behavioral disturbance: Secondary | ICD-10-CM | POA: Diagnosis not present

## 2017-11-25 NOTE — Progress Notes (Addendum)
Darryl [name] was seen today in the movement disorders clinic for neurologic consultation at the request of [name], MD.  The consultation is for the evaluation of PD.  This patient is accompanied in the office by his spouse who supplements the history.Pt previously under the care of Dr. Janann Colonel and then Dr. Jaynee Eagles.  The records that were made available to me were reviewed.  Patient presented to Dr. Janann Colonel in February, 2015 with right greater than left upper extremity tremor.  Symptoms have been going on for a few years prior to presentation to Dr. Janann Colonel.  Shuffling of the feet was also noted by his wife.  Dr. Janann Colonel felt that it was likely idiopathic Parkinson's disease.  After his first visit with Dr. Janann Colonel, an MRI of the brain was ordered and suggested possible amyloid angiopathy.  He was referred to Dr. Leonie Man for consultation who felt that he did not have amyloid angiopathy, but rather cerebral small vessel disease and cerebral micro-bleeds due to long-standing uncontrolled hypertension.  He followed back up with Dr. Janann Colonel, who did not feel he needed medication for parkinsonism.  He then began to see Dr. Jaynee Eagles in December, 2015.  They opted for no medication until September, 2017 at which time levodopa was recommended.  He called back a few days later with stomach cramping and some decreasing of blood pressure and the medication was discontinued.  Therefore, in January, 2018 Rytary was recommended but he decided not to take that.  He has also been very resistant to in home therapies per records but is currently participating after a stay in the hospital.  Patient was recently in the hospital from May 20 to Nov 17, 2017.  He was profoundly weak and found on the floor without ability to get himself up.  White count was high and it was presumed he had aspiration pneumonia.  He was septic.   Specific Symptoms:  Tremor: Yes.  , but not as much as in the past per wife Family hx of similar:   No. Voice: gotten weaker Sleep: trouble waking up and not being able to get back to sleep  Vivid Dreams:  no (some but more waking up and thinking granddaughter had been there)  Acting out dreams:  No. Wet Pillows: No. Postural symptoms:  Yes.   - gotten better since out of the hospital.    Falls?  Yes.  , had a slide down at the hospital.  Had a fall in 09/2016 (over year ago) in the bathroom and hit head.   Bradykinesia symptoms: shuffling gait, slow movements and difficulty getting out of a chair Loss of smell:  No. Loss of taste:  No. Urinary Incontinence:  No. Difficulty Swallowing:  No. Handwriting, micrographia: Yes.   Trouble with ADL's:  No. - some assist at times per wife  Trouble buttoning clothing: No. Depression:  No. Memory changes:  Yes.   per wife but no per patient; doesn't drive and hasn't for 2.5 years;  Wife does pill box; wife does household finances and cooking Hallucinations:  Yes.   per wife but no per patient (doesn't happen even weekly)  visual distortions: No. N/V:  No. Lightheaded:  No.  Syncope: No. Diplopia:  No. Dyskinesia:  No.  had a CT brain in 09/2017 demonstrating significant WMD and old lacunes in BG and thalamus  PREVIOUS MEDICATIONS: Sinemet  ALLERGIES:   Allergies  Allergen Reactions  . Ambien [Zolpidem Tartrate] Other (See Comments)    hallucinations  .  Aspirin Other (See Comments)    ulcers  . Celebrex [Celecoxib] Other (See Comments)    Due to ulcer.  . Nsaids Other (See Comments)    Due to ulcer.  . Toprol Xl [Metoprolol Tartrate] Swelling  . Altace [Ramipril]     UNKNOWN  . Amlodipine     Feet swell  . Levothyroxine Other (See Comments)    "made him feel bad"  . Biaxin [Clarithromycin] Nausea Only  . Tetracyclines & Related Nausea Only    CURRENT MEDICATIONS:  Outpatient Encounter Medications as of 11/29/2017  Medication Sig  . acetaminophen (TYLENOL) 500 MG tablet Take 1,000 mg by mouth every 8 (eight) hours as needed  for mild pain or moderate pain.  [name] aspirin EC 81 MG tablet Take 81 mg by mouth. Patient takes 3 times a week  . carvedilol (COREG) 6.25 MG tablet Take 1 tablet (6.25 mg total) by mouth 2 (two) times daily.  . citalopram (CELEXA) 20 MG tablet Take 20 mg by mouth at bedtime.   . cloNIDine (CATAPRES) 0.3 MG tablet Take 0.3 mg by mouth 2 (two) times daily.   . furosemide (LASIX) 20 MG tablet Take 1 tablet (20 mg total) by mouth daily.  . hydrALAZINE (APRESOLINE) 25 MG tablet Take 1 tablet (25 mg total) by mouth 3 (three) times daily.  . irbesartan (AVAPRO) 300 MG tablet Take 300 mg by mouth at bedtime.   [name] KLOR-CON 10 10 MEQ tablet Take 1 tablet (10 mEq total) by mouth daily.  . pantoprazole (PROTONIX) 40 MG tablet Take 40 mg by mouth 3 (three) times a week.   . traMADol (ULTRAM) 50 MG tablet Take 50 mg by mouth daily as needed (pain).   . Maltodextrin-Xanthan Gum ([place]) POWD As needed with meals. (Patient not taking: Reported on 11/29/2017)  . nitroGLYCERIN (NITROSTAT) 0.4 MG SL tablet Place 1 tablet (0.4 mg total) under the tongue every 5 (five) minutes as needed for chest pain. (Patient not taking: Reported on 11/29/2017)  . [DISCONTINUED] dextromethorphan-guaiFENesin (MUCINEX DM) 30-600 MG 12hr tablet Take 1 tablet by mouth 2 (two) times daily as needed for cough.   No facility-administered encounter medications on file as of 11/29/2017.     PAST MEDICAL HISTORY:   Past Medical History:  Diagnosis Date  . Arthritis   . BPH (benign prostatic hypertrophy) with urinary retention   . Chronic diastolic CHF (congestive heart failure) ([place])   . Colon polyp   . Coronary atherosclerosis of native coronary artery    a. s/p CABG 11/2003. b. s/p DES to radial-OM and SVG-RCA 11/05. c. s/p BMS to LCx 11/2008.  . Dilated aortic root ([place]) 05/29/2014   66m by echo 04/2017  . Diverticulosis   . Dyslipidemia    statin intolerant  . Elevated fasting glucose   . Gastritis    a. by EGD  04/2004.  . H/O urinary retention    post surgical procedures; has foley cath  . Headache(784.0)   . Hiatal hernia   . HOH (hard of hearing)    slight hoh, rt>l  . HTN (hypertension)   . Hypertensive heart disease   . Insomnia   . [place]acranial hemorrhage ([place]    a. small [place]ine [place].  .Marland KitchenLBBB (left bundle branch block)   . Lumbar spondylolysis    multilevel DJD with mild spinal stenosis L3-L5  . Migraines   . [place]'s disease [place]   . Peptic ulcer disease    a. prior hx of  bleeding ulcers, including 12/2003 in setting of aspirin use.  . Renal insufficiency    renal duplex scan in 2012 showed no renal artery stenosis  . Sleep disturbance   . Stroke [facility])    no clinical manifestations - seen on neuroimaging  . Thrombocytopenia ([place])   . Tremor   . UTI (urinary tract infection)   . Wears glasses     PAST SURGICAL HISTORY:   Past Surgical History:  Procedure Laterality Date  . APPENDECTOMY  62   1963  . CATARACT EXTRACTION, BILATERAL    . CHOLECYSTECTOMY  1998  . COLON SURGERY  03/14/2008  . COLONOSCOPY  10/2001  . CORONARY ANGIOPLASTY  6/10,10/05   Dr. [name] cardiologist: last office visit 11/07/14  . CORONARY ARTERY BYPASS GRAFT  2005  . CYSTOSCOPY WITH LITHOLAPAXY N/A 11/19/2014   Procedure: CYSTOSCOPY WITH LITHOLAPAXY;  Surgeon: [name], MD;  Location[facility];  Service: Urology;  Laterality: N/A;  . ESOPHAGOGASTRODUODENOSCOPY     10/2001, 12/2003, 04/2004  . LUMBAR LAMINECTOMY/DECOMPRESSION MICRODISCECTOMY N/A 05/03/2013   Procedure: LUMBAR LAMINECTOMY/DCOMPRESSION MICRODISCECTOMY RIGHT LUMBAR FOUR FIVE;  Surgeon: [name], MD;  Location: [place] [facility];  Service: Neurosurgery;  Laterality: N/A;  . SHOULDER ARTHROSCOPY Left 9/10  . stones     hx  . TRANSURETHRAL RESECTION OF PROSTATE N/A 11/19/2014   Procedure: TRANSURETHRAL RESECTION OF THE PROSTATE WITH GYRUS INSTRUMENTS;  Surgeon: [name], MD;  Location: [facility];  Service: Urology;  Laterality: N/A;  . WISDOM TOOTH EXTRACTION      SOCIAL HISTORY:   Social History   Socioeconomic History  . Marital status: Married    Spouse name: [name]  . Number of children: 4  . Years of education: College  . Highest education level: Not on file  Occupational History  . Not on file  Social Needs  . Financial resource strain: Not on file  . Food insecurity:    Worry: Not on file    Inability: Not on file  . Transportation needs:    Medical: Not on file    Non-medical: Not on file  Tobacco Use  . Smoking status: Former Smoker    Packs/day: 2.00    Years: 5.00    Pack years: 10.00    Types: Cigarettes    Last attempt to quit: 04/28/1959    Years since quitting: 58.6  . Smokeless tobacco: Never Used  . Tobacco comment: 55+ yrs ago  Substance and Sexual Activity  . Alcohol use: No  . Drug use: No  . Sexual activity: Not on file  Lifestyle  . Physical activity:    Days per week: Not on file    Minutes per session: Not on file  . Stress: Not on file  Relationships  . Social connections:    Talks on phone: Not on file    Gets together: Not on file    Attends religious service: Not on file    Active member of club or organization: Not on file    Attends meetings of clubs or organizations: Not on file    Relationship status: Not on file  . Intimate partner violence:    Fear of current or ex partner: Not on file    Emotionally abused: Not on file    Physically abused: Not on file    Forced sexual activity: Not on file  Other Topics Concern  . Not on file  Social History Narrative   Patient is  married to [name]), has 4 children   Patient is right handed   Education level is college   Caffeine consumption is none    FAMILY HISTORY:   Family Status  Relation Name Status  . Mother  Deceased  . Father  Deceased at age 63  . Sister  Alive  . Child x4 Alive    ROS:  A complete 10 system review of systems was obtained  and was unremarkable apart from what is mentioned above.  PHYSICAL EXAMINATION:    VITALS:   Vitals:   11/29/17 1027  BP: 102/66  Pulse: 65  SpO2: 96%  Weight: 180 lb (81.6 kg)  Height: '5\' 9"'$  (1.753 m)    GEN:  The patient appears stated age and is in NAD.  Pt easily went from pleasant to easily agitated.  Also easily falls asleep during history (wife states that this is typical after taking AM meds) HEENT:  Normocephalic, atraumatic.  The mucous membranes are dry. The superficial temporal arteries are without ropiness or tenderness. CV:  RRR Lungs:  CTAB Neck/HEME:  There are no carotid bruits bilaterally.  Neurological examination:  Orientation: The patient is alert.  Unable to do MoCA/cognitive screening as patient is easily agitated.   Cranial nerves: There is good facial symmetry. There is facial hypomimia.  Pupils are equal round and reactive to light bilaterally. Fundoscopic exam is attempted but the disc margins are not well visualized bilaterally.   Extraocular muscles are intact. The visual fields are full to confrontational testing. The speech is fluent and clear. Soft palate rises symmetrically and there is no tongue deviation. Hearing is intact to conversational tone. Sensation: Sensation is intact to light and pinprick throughout (facial, trunk, extremities). Vibration is intact at the bilateral big toe. There is no extinction with double simultaneous stimulation. There is no sensory dermatomal level identified. Motor: Strength is 5/5 in the bilateral upper and lower extremities.   Shoulder shrug is equal and symmetric.  There is no pronator drift. Deep tendon reflexes: Deep tendon reflexes are 2-/4 at the bilateral biceps, triceps, brachioradialis, patella. Plantar responses are downgoing bilaterally.  Movement examination: Tone: The patient has trouble relaxing to properly assess.  He resists passive motion Abnormal movements: none Coordination:  There is minor  decremation with RAM's on the L but some of this is apraxia.   Gait and Station: The patient has difficulty arising out of a deep-seated chair without the use of the hands. Even with pushing off of the chair, assist is requred.  The patient's stride length is decreased and drags the left leg.  Does better with  walker.    Addendum labs: Labs were received from the patient's primary care physician and reviewed.  Labs were completed on Nov 25, 2017.  Sodium was 140, potassium 4.1, chloride 105, CO2 28, BUN 17, creatinine 1.33.  Glucose was 108.  White blood cells were 8.6, hemoglobin 14.5, hematocrit 41.2 and platelets 142.  ASSESSMENT/PLAN:  1.  Parkinson's disease  -Has had the diagnosis at least since 2015, and symptoms prior to that.  Although medication has been tried, it certainly has not been tried for more than a few days per detailed review of the records.  He is convinced that he cannot tolerate the medication.  Discussed with the patient and his wife that there has never been a patient that I have had that is completely intolerant to levodopa, although different versions are sometimes needed.  They are wanting to try carbidopa/levodopa 25/100  CR, 1 tablet daily for 1 week, then 1 tablet twice per day for a week and then 1 tablet 3 times per day.  Risks, benefits, side effects and alternative therapies were discussed.  The opportunity to ask questions was given and they were answered to the best of my ability.  The patient expressed understanding and willingness to follow the outlined treatment protocols.  -We discussed the diagnosis as well as pathophysiology of the disease.  We discussed treatment options as well as prognostic indicators.  Patient education was provided.  -We discussed that it used to be thought that levodopa would increase risk of melanoma but now it is believed that Parkinsons itself likely increases risk of melanoma. he is to get regular skin checks.  -We discussed community  resources in the area including patient support groups and community exercise programs for PD and pt education was provided to the patient.  Met with Education officer, museum (briefly as pt a bit agitated with doing this)   2.  htn  -BP is low today and the patient is very sleepy.  Wonder if he needs so many BP meds.  Wife hasn't even given him all meds yet today.  To f/u with Dr. Radford Pax but will copy note to her as well.  3.  Aspiration pneumonia  -MBE 11/16/17.  This demonstrated moderate to severe primarily motor impairments causing laryngeal penetration and aspiration of thin and nectar barium.  A dysphagia 3 diet with nectar thick liquids, hard coughing after liquids was recommended.  Patient's wife is having him eat thicker consistency food, but the patient really does not like nectar thick liquid.  Understands the risk.  -has a DNR order per wife.    -using applesauce as vehicle for food.  4.  Suspect Dementia  -needs 24 hour per day care and discussed this with them.  As above, I could not do formal cognitive testing as the patient became irritated with things such as this.  His wife is providing the care.  5.  Insomnia  -has melatonin but not really taking faithfully.  Told his wife to try the 5 mg that she has at home faithfully.  6.  Follow up is anticipated in the next few months, sooner should new neurologic issues arise. Much greater than 50% of this visit was spent in counseling and coordinating care.  Total face to face time:  60 min.This did not include the 40 min of record review which was detailed above, which was non face to face time.   Cc:  [name], MD

## 2017-11-25 NOTE — Telephone Encounter (Signed)
PT [facility] has called for verbal orders: Adding to [facility] speech therapy evaluation  and skilled nursing

## 2017-11-26 DIAGNOSIS — M5410 Radiculopathy, site unspecified: Secondary | ICD-10-CM | POA: Diagnosis not present

## 2017-11-26 DIAGNOSIS — M48061 Spinal stenosis, lumbar region without neurogenic claudication: Secondary | ICD-10-CM | POA: Diagnosis not present

## 2017-11-26 DIAGNOSIS — F0280 Dementia in other diseases classified elsewhere without behavioral disturbance: Secondary | ICD-10-CM | POA: Diagnosis not present

## 2017-11-26 DIAGNOSIS — I1310 Hypertensive heart and chronic kidney disease without heart failure, with stage 1 through stage 4 chronic kidney disease, or unspecified chronic kidney disease: Secondary | ICD-10-CM | POA: Diagnosis not present

## 2017-11-26 DIAGNOSIS — I2510 Atherosclerotic heart disease of native coronary artery without angina pectoris: Secondary | ICD-10-CM | POA: Diagnosis not present

## 2017-11-26 DIAGNOSIS — G20 Parkinson's disease: Secondary | ICD-10-CM | POA: Diagnosis not present

## 2017-11-26 NOTE — Telephone Encounter (Signed)
Ok for verbal order  °

## 2017-11-26 NOTE — Telephone Encounter (Signed)
I called and spoke with Sonia Baller and made her aware that it was ok for those verbal orders.

## 2017-11-26 NOTE — Telephone Encounter (Signed)
That's fine thanls please do it

## 2017-11-29 ENCOUNTER — Encounter: Payer: Self-pay | Admitting: Neurology

## 2017-11-29 ENCOUNTER — Ambulatory Visit (INDEPENDENT_AMBULATORY_CARE_PROVIDER_SITE_OTHER): Payer: Medicare Other | Admitting: Neurology

## 2017-11-29 ENCOUNTER — Encounter: Payer: Self-pay | Admitting: Psychology

## 2017-11-29 VITALS — BP 102/66 | HR 65 | Ht 69.0 in | Wt 180.0 lb

## 2017-11-29 DIAGNOSIS — G4700 Insomnia, unspecified: Secondary | ICD-10-CM

## 2017-11-29 DIAGNOSIS — J690 Pneumonitis due to inhalation of food and vomit: Secondary | ICD-10-CM

## 2017-11-29 DIAGNOSIS — G20 Parkinson's disease: Secondary | ICD-10-CM

## 2017-11-29 DIAGNOSIS — F0281 Dementia in other diseases classified elsewhere with behavioral disturbance: Secondary | ICD-10-CM

## 2017-11-29 DIAGNOSIS — F0280 Dementia in other diseases classified elsewhere without behavioral disturbance: Secondary | ICD-10-CM | POA: Diagnosis not present

## 2017-11-29 DIAGNOSIS — I2510 Atherosclerotic heart disease of native coronary artery without angina pectoris: Secondary | ICD-10-CM

## 2017-11-29 DIAGNOSIS — M5410 Radiculopathy, site unspecified: Secondary | ICD-10-CM | POA: Diagnosis not present

## 2017-11-29 DIAGNOSIS — F02818 Dementia in other diseases classified elsewhere, unspecified severity, with other behavioral disturbance: Secondary | ICD-10-CM

## 2017-11-29 DIAGNOSIS — M48061 Spinal stenosis, lumbar region without neurogenic claudication: Secondary | ICD-10-CM | POA: Diagnosis not present

## 2017-11-29 DIAGNOSIS — I1310 Hypertensive heart and chronic kidney disease without heart failure, with stage 1 through stage 4 chronic kidney disease, or unspecified chronic kidney disease: Secondary | ICD-10-CM | POA: Diagnosis not present

## 2017-11-29 MED ORDER — CARBIDOPA-LEVODOPA ER 25-100 MG PO TBCR: 1.0000 | EXTENDED_RELEASE_TABLET | Freq: Every day | ORAL | 2 refills | Status: DC

## 2017-11-29 NOTE — Patient Instructions (Signed)
1. Start Carbidopa Levodopa 25/100 CR as follows:  Take one tablet at 8 am for one week,  Then one tablet at 8 am and one tablet at 12 pm for one week,  Then one tablet at 8 am, one tablet at 12 pm and one tablet at 4 pm.   2. Take Melatonin 5 mg - one tablet at bedtime nightly.

## 2017-11-29 NOTE — Progress Notes (Signed)
I met with the patient and his wife while they were in the clinic today.  This meeting was very brief.  However, I provided the patient's wife with information on the caregiver support group and attached information on respite care programs as it appears she may benefit from the use of some respite programs in the future.  I provided the patient with my contact information and the plan is that the caregiver will reach out to me to discuss the information that I included with the caregiver support group.

## 2017-12-01 ENCOUNTER — Telehealth: Payer: Self-pay | Admitting: Neurology

## 2017-12-01 DIAGNOSIS — M48061 Spinal stenosis, lumbar region without neurogenic claudication: Secondary | ICD-10-CM | POA: Diagnosis not present

## 2017-12-01 DIAGNOSIS — I2510 Atherosclerotic heart disease of native coronary artery without angina pectoris: Secondary | ICD-10-CM | POA: Diagnosis not present

## 2017-12-01 DIAGNOSIS — F0280 Dementia in other diseases classified elsewhere without behavioral disturbance: Secondary | ICD-10-CM | POA: Diagnosis not present

## 2017-12-01 DIAGNOSIS — I1310 Hypertensive heart and chronic kidney disease without heart failure, with stage 1 through stage 4 chronic kidney disease, or unspecified chronic kidney disease: Secondary | ICD-10-CM | POA: Diagnosis not present

## 2017-12-01 DIAGNOSIS — M5410 Radiculopathy, site unspecified: Secondary | ICD-10-CM | POA: Diagnosis not present

## 2017-12-01 DIAGNOSIS — G20 Parkinson's disease: Secondary | ICD-10-CM | POA: Diagnosis not present

## 2017-12-01 NOTE — Telephone Encounter (Signed)
-----   Message from Natividad Brood, [place] sent at 11/30/2017  4:11 PM EDT ----- Regarding: question about labs  Patient's wife called and had questions about previous labs from Dr. Sabra Heck? She was a little hard to follow. I really could not get a direct question from her. However, I understood her to think it should have been brought up yesterday and she forgot to ask. I told her that I would contact you to follow up.   She had more general parkinson's progression questions. I directed her to some general education on parkinson's disease. Is not interested in caregiver group at this or the respite resources. She stated that her daughter checks in on her and tells her to take care of herself.

## 2017-12-01 NOTE — Telephone Encounter (Signed)
Spoke with patient's wife. She just wanted to know if we received labs from Dr. Ammie Ferrier office. I don't see it mentioned in the note. Did you receive these?

## 2017-12-02 NOTE — Telephone Encounter (Signed)
I don't think so

## 2017-12-02 NOTE — Telephone Encounter (Signed)
Wife was going to ask Dr. Sabra Heck to send to [place].

## 2017-12-07 NOTE — Telephone Encounter (Signed)
Received request for last office visit notes to be sent for F2F encounter for home health services. Dr. Jaynee Eagles signed form and office note from 09/29/17 was included. Faxed to [facility]. Received a receipt of confirmation.

## 2017-12-09 DIAGNOSIS — I2510 Atherosclerotic heart disease of native coronary artery without angina pectoris: Secondary | ICD-10-CM | POA: Diagnosis not present

## 2017-12-09 DIAGNOSIS — G20 Parkinson's disease: Secondary | ICD-10-CM | POA: Diagnosis not present

## 2017-12-09 DIAGNOSIS — F0280 Dementia in other diseases classified elsewhere without behavioral disturbance: Secondary | ICD-10-CM | POA: Diagnosis not present

## 2017-12-09 DIAGNOSIS — M48061 Spinal stenosis, lumbar region without neurogenic claudication: Secondary | ICD-10-CM | POA: Diagnosis not present

## 2017-12-09 DIAGNOSIS — I1310 Hypertensive heart and chronic kidney disease without heart failure, with stage 1 through stage 4 chronic kidney disease, or unspecified chronic kidney disease: Secondary | ICD-10-CM | POA: Diagnosis not present

## 2017-12-09 DIAGNOSIS — M5410 Radiculopathy, site unspecified: Secondary | ICD-10-CM | POA: Diagnosis not present

## 2017-12-10 ENCOUNTER — Telehealth: Payer: Self-pay | Admitting: Neurology

## 2017-12-10 NOTE — Telephone Encounter (Signed)
Shawn/[name] [phone] request to move PT to next week for 3x1, then 2x1. He said the pt had PT the day before and did not want to do it again the next day.

## 2017-12-13 NOTE — Telephone Encounter (Signed)
Called and spoke with Shawn and gave the verbal order for the change to be made to help benefit the patient.

## 2017-12-14 DIAGNOSIS — G20 Parkinson's disease: Secondary | ICD-10-CM | POA: Diagnosis not present

## 2017-12-14 DIAGNOSIS — I1310 Hypertensive heart and chronic kidney disease without heart failure, with stage 1 through stage 4 chronic kidney disease, or unspecified chronic kidney disease: Secondary | ICD-10-CM | POA: Diagnosis not present

## 2017-12-14 DIAGNOSIS — F0280 Dementia in other diseases classified elsewhere without behavioral disturbance: Secondary | ICD-10-CM | POA: Diagnosis not present

## 2017-12-14 DIAGNOSIS — M48061 Spinal stenosis, lumbar region without neurogenic claudication: Secondary | ICD-10-CM | POA: Diagnosis not present

## 2017-12-14 DIAGNOSIS — I2510 Atherosclerotic heart disease of native coronary artery without angina pectoris: Secondary | ICD-10-CM | POA: Diagnosis not present

## 2017-12-14 DIAGNOSIS — M5410 Radiculopathy, site unspecified: Secondary | ICD-10-CM | POA: Diagnosis not present

## 2017-12-16 DIAGNOSIS — I1310 Hypertensive heart and chronic kidney disease without heart failure, with stage 1 through stage 4 chronic kidney disease, or unspecified chronic kidney disease: Secondary | ICD-10-CM | POA: Diagnosis not present

## 2017-12-16 DIAGNOSIS — M5410 Radiculopathy, site unspecified: Secondary | ICD-10-CM | POA: Diagnosis not present

## 2017-12-16 DIAGNOSIS — G20 Parkinson's disease: Secondary | ICD-10-CM | POA: Diagnosis not present

## 2017-12-16 DIAGNOSIS — F0280 Dementia in other diseases classified elsewhere without behavioral disturbance: Secondary | ICD-10-CM | POA: Diagnosis not present

## 2017-12-16 DIAGNOSIS — M48061 Spinal stenosis, lumbar region without neurogenic claudication: Secondary | ICD-10-CM | POA: Diagnosis not present

## 2017-12-16 DIAGNOSIS — I2510 Atherosclerotic heart disease of native coronary artery without angina pectoris: Secondary | ICD-10-CM | POA: Diagnosis not present

## 2017-12-16 NOTE — Telephone Encounter (Signed)
Called [place] at [facility] and informed them that was ok to completed that plan. She was appreciative for the call back.

## 2017-12-16 NOTE — Telephone Encounter (Signed)
[name]/[name] [phone] request 2x 1, 3x 1 then he will be discharged. She is doing reassessment today and the goals should be met by the time they are finished.

## 2017-12-17 ENCOUNTER — Encounter: Payer: Self-pay | Admitting: Neurology

## 2017-12-17 MED ORDER — CARBIDOPA-LEVODOPA ER 25-100 MG PO TBCR: 1.0000 | EXTENDED_RELEASE_TABLET | Freq: Three times a day (TID) | ORAL | 2 refills | Status: DC

## 2017-12-20 DIAGNOSIS — M48061 Spinal stenosis, lumbar region without neurogenic claudication: Secondary | ICD-10-CM | POA: Diagnosis not present

## 2017-12-20 DIAGNOSIS — I1310 Hypertensive heart and chronic kidney disease without heart failure, with stage 1 through stage 4 chronic kidney disease, or unspecified chronic kidney disease: Secondary | ICD-10-CM | POA: Diagnosis not present

## 2017-12-20 DIAGNOSIS — F0280 Dementia in other diseases classified elsewhere without behavioral disturbance: Secondary | ICD-10-CM | POA: Diagnosis not present

## 2017-12-20 DIAGNOSIS — G20 Parkinson's disease: Secondary | ICD-10-CM | POA: Diagnosis not present

## 2017-12-20 DIAGNOSIS — M5410 Radiculopathy, site unspecified: Secondary | ICD-10-CM | POA: Diagnosis not present

## 2017-12-20 DIAGNOSIS — I2510 Atherosclerotic heart disease of native coronary artery without angina pectoris: Secondary | ICD-10-CM | POA: Diagnosis not present

## 2017-12-21 DIAGNOSIS — G20 Parkinson's disease: Secondary | ICD-10-CM | POA: Diagnosis not present

## 2017-12-21 DIAGNOSIS — I1310 Hypertensive heart and chronic kidney disease without heart failure, with stage 1 through stage 4 chronic kidney disease, or unspecified chronic kidney disease: Secondary | ICD-10-CM | POA: Diagnosis not present

## 2017-12-21 DIAGNOSIS — I2510 Atherosclerotic heart disease of native coronary artery without angina pectoris: Secondary | ICD-10-CM | POA: Diagnosis not present

## 2017-12-21 DIAGNOSIS — M48061 Spinal stenosis, lumbar region without neurogenic claudication: Secondary | ICD-10-CM | POA: Diagnosis not present

## 2017-12-21 DIAGNOSIS — F0280 Dementia in other diseases classified elsewhere without behavioral disturbance: Secondary | ICD-10-CM | POA: Diagnosis not present

## 2017-12-21 DIAGNOSIS — M5410 Radiculopathy, site unspecified: Secondary | ICD-10-CM | POA: Diagnosis not present

## 2017-12-21 NOTE — Telephone Encounter (Signed)
Angie with [facility] calling to get orders for skilled nursing 2 times a week for 3 weeks.

## 2017-12-21 NOTE — Telephone Encounter (Signed)
RN Angie at [facility] has returned the call to [employer].  She is asking for a call back at [phone]

## 2017-12-21 NOTE — Telephone Encounter (Signed)
Spoke with Angie from [facility] and gave her v.o. For skilled nursing 2 times a week for 3 weeks. Also informed her that RN had just faxed the order for the week that was skipped. She verbalized appreciation and understanding.   Received a receipt of confirmation.

## 2017-12-21 NOTE — Telephone Encounter (Signed)
Returned Angie's call and LVM asking for call back regarding verbal order. Left number in message.

## 2017-12-22 DIAGNOSIS — M5410 Radiculopathy, site unspecified: Secondary | ICD-10-CM | POA: Diagnosis not present

## 2017-12-22 DIAGNOSIS — M48061 Spinal stenosis, lumbar region without neurogenic claudication: Secondary | ICD-10-CM | POA: Diagnosis not present

## 2017-12-22 DIAGNOSIS — G20 Parkinson's disease: Secondary | ICD-10-CM | POA: Diagnosis not present

## 2017-12-22 DIAGNOSIS — I2510 Atherosclerotic heart disease of native coronary artery without angina pectoris: Secondary | ICD-10-CM | POA: Diagnosis not present

## 2017-12-22 DIAGNOSIS — I1310 Hypertensive heart and chronic kidney disease without heart failure, with stage 1 through stage 4 chronic kidney disease, or unspecified chronic kidney disease: Secondary | ICD-10-CM | POA: Diagnosis not present

## 2017-12-22 DIAGNOSIS — F0280 Dementia in other diseases classified elsewhere without behavioral disturbance: Secondary | ICD-10-CM | POA: Diagnosis not present

## 2017-12-23 ENCOUNTER — Telehealth: Payer: Self-pay | Admitting: Neurology

## 2017-12-23 NOTE — Telephone Encounter (Signed)
Somal speech therapist with [facility] requesting a call at [phone] for VO for 1x1 2x3.

## 2017-12-23 NOTE — Telephone Encounter (Signed)
Verbal order given to Somal speech therapist for visits 1x1, 2x3. She verbalized appreciation.

## 2017-12-24 DIAGNOSIS — M48061 Spinal stenosis, lumbar region without neurogenic claudication: Secondary | ICD-10-CM | POA: Diagnosis not present

## 2017-12-24 DIAGNOSIS — I2510 Atherosclerotic heart disease of native coronary artery without angina pectoris: Secondary | ICD-10-CM | POA: Diagnosis not present

## 2017-12-24 DIAGNOSIS — I1310 Hypertensive heart and chronic kidney disease without heart failure, with stage 1 through stage 4 chronic kidney disease, or unspecified chronic kidney disease: Secondary | ICD-10-CM | POA: Diagnosis not present

## 2017-12-24 DIAGNOSIS — M5410 Radiculopathy, site unspecified: Secondary | ICD-10-CM | POA: Diagnosis not present

## 2017-12-24 DIAGNOSIS — F0280 Dementia in other diseases classified elsewhere without behavioral disturbance: Secondary | ICD-10-CM | POA: Diagnosis not present

## 2017-12-24 DIAGNOSIS — G20 Parkinson's disease: Secondary | ICD-10-CM | POA: Diagnosis not present

## 2017-12-25 DIAGNOSIS — F0280 Dementia in other diseases classified elsewhere without behavioral disturbance: Secondary | ICD-10-CM | POA: Diagnosis not present

## 2017-12-25 DIAGNOSIS — Z951 Presence of aortocoronary bypass graft: Secondary | ICD-10-CM | POA: Diagnosis not present

## 2017-12-25 DIAGNOSIS — G20 Parkinson's disease: Secondary | ICD-10-CM | POA: Diagnosis not present

## 2017-12-25 DIAGNOSIS — R338 Other retention of urine: Secondary | ICD-10-CM | POA: Diagnosis not present

## 2017-12-25 DIAGNOSIS — F17211 Nicotine dependence, cigarettes, in remission: Secondary | ICD-10-CM | POA: Diagnosis not present

## 2017-12-25 DIAGNOSIS — I1310 Hypertensive heart and chronic kidney disease without heart failure, with stage 1 through stage 4 chronic kidney disease, or unspecified chronic kidney disease: Secondary | ICD-10-CM | POA: Diagnosis not present

## 2017-12-25 DIAGNOSIS — M48061 Spinal stenosis, lumbar region without neurogenic claudication: Secondary | ICD-10-CM | POA: Diagnosis not present

## 2017-12-25 DIAGNOSIS — Z9181 History of falling: Secondary | ICD-10-CM | POA: Diagnosis not present

## 2017-12-25 DIAGNOSIS — R1313 Dysphagia, pharyngeal phase: Secondary | ICD-10-CM | POA: Diagnosis not present

## 2017-12-25 DIAGNOSIS — N183 Chronic kidney disease, stage 3 (moderate): Secondary | ICD-10-CM | POA: Diagnosis not present

## 2017-12-25 DIAGNOSIS — I5022 Chronic systolic (congestive) heart failure: Secondary | ICD-10-CM | POA: Diagnosis not present

## 2017-12-25 DIAGNOSIS — Z87440 Personal history of urinary (tract) infections: Secondary | ICD-10-CM | POA: Diagnosis not present

## 2017-12-25 DIAGNOSIS — M5410 Radiculopathy, site unspecified: Secondary | ICD-10-CM | POA: Diagnosis not present

## 2017-12-25 DIAGNOSIS — N401 Enlarged prostate with lower urinary tract symptoms: Secondary | ICD-10-CM | POA: Diagnosis not present

## 2017-12-25 DIAGNOSIS — I2510 Atherosclerotic heart disease of native coronary artery without angina pectoris: Secondary | ICD-10-CM | POA: Diagnosis not present

## 2017-12-28 DIAGNOSIS — M48061 Spinal stenosis, lumbar region without neurogenic claudication: Secondary | ICD-10-CM | POA: Diagnosis not present

## 2017-12-28 DIAGNOSIS — I2510 Atherosclerotic heart disease of native coronary artery without angina pectoris: Secondary | ICD-10-CM | POA: Diagnosis not present

## 2017-12-28 DIAGNOSIS — G20 Parkinson's disease: Secondary | ICD-10-CM | POA: Diagnosis not present

## 2017-12-28 DIAGNOSIS — F0280 Dementia in other diseases classified elsewhere without behavioral disturbance: Secondary | ICD-10-CM | POA: Diagnosis not present

## 2017-12-28 DIAGNOSIS — R1313 Dysphagia, pharyngeal phase: Secondary | ICD-10-CM | POA: Diagnosis not present

## 2017-12-28 DIAGNOSIS — M5410 Radiculopathy, site unspecified: Secondary | ICD-10-CM | POA: Diagnosis not present

## 2017-12-29 DIAGNOSIS — F0280 Dementia in other diseases classified elsewhere without behavioral disturbance: Secondary | ICD-10-CM | POA: Diagnosis not present

## 2017-12-29 DIAGNOSIS — M5410 Radiculopathy, site unspecified: Secondary | ICD-10-CM | POA: Diagnosis not present

## 2017-12-29 DIAGNOSIS — G20 Parkinson's disease: Secondary | ICD-10-CM | POA: Diagnosis not present

## 2017-12-29 DIAGNOSIS — M48061 Spinal stenosis, lumbar region without neurogenic claudication: Secondary | ICD-10-CM | POA: Diagnosis not present

## 2017-12-29 DIAGNOSIS — I2510 Atherosclerotic heart disease of native coronary artery without angina pectoris: Secondary | ICD-10-CM | POA: Diagnosis not present

## 2017-12-29 DIAGNOSIS — R1313 Dysphagia, pharyngeal phase: Secondary | ICD-10-CM | POA: Diagnosis not present

## 2017-12-31 DIAGNOSIS — M48061 Spinal stenosis, lumbar region without neurogenic claudication: Secondary | ICD-10-CM | POA: Diagnosis not present

## 2017-12-31 DIAGNOSIS — F0280 Dementia in other diseases classified elsewhere without behavioral disturbance: Secondary | ICD-10-CM | POA: Diagnosis not present

## 2017-12-31 DIAGNOSIS — G20 Parkinson's disease: Secondary | ICD-10-CM | POA: Diagnosis not present

## 2017-12-31 DIAGNOSIS — R1313 Dysphagia, pharyngeal phase: Secondary | ICD-10-CM | POA: Diagnosis not present

## 2017-12-31 DIAGNOSIS — M5410 Radiculopathy, site unspecified: Secondary | ICD-10-CM | POA: Diagnosis not present

## 2017-12-31 DIAGNOSIS — I2510 Atherosclerotic heart disease of native coronary artery without angina pectoris: Secondary | ICD-10-CM | POA: Diagnosis not present

## 2018-01-03 DIAGNOSIS — G20 Parkinson's disease: Secondary | ICD-10-CM | POA: Diagnosis not present

## 2018-01-03 DIAGNOSIS — M5410 Radiculopathy, site unspecified: Secondary | ICD-10-CM | POA: Diagnosis not present

## 2018-01-03 DIAGNOSIS — M48061 Spinal stenosis, lumbar region without neurogenic claudication: Secondary | ICD-10-CM | POA: Diagnosis not present

## 2018-01-03 DIAGNOSIS — I2510 Atherosclerotic heart disease of native coronary artery without angina pectoris: Secondary | ICD-10-CM | POA: Diagnosis not present

## 2018-01-03 DIAGNOSIS — R1313 Dysphagia, pharyngeal phase: Secondary | ICD-10-CM | POA: Diagnosis not present

## 2018-01-03 DIAGNOSIS — F0280 Dementia in other diseases classified elsewhere without behavioral disturbance: Secondary | ICD-10-CM | POA: Diagnosis not present

## 2018-01-06 DIAGNOSIS — F0280 Dementia in other diseases classified elsewhere without behavioral disturbance: Secondary | ICD-10-CM | POA: Diagnosis not present

## 2018-01-06 DIAGNOSIS — M48061 Spinal stenosis, lumbar region without neurogenic claudication: Secondary | ICD-10-CM | POA: Diagnosis not present

## 2018-01-06 DIAGNOSIS — G20 Parkinson's disease: Secondary | ICD-10-CM | POA: Diagnosis not present

## 2018-01-06 DIAGNOSIS — R1313 Dysphagia, pharyngeal phase: Secondary | ICD-10-CM | POA: Diagnosis not present

## 2018-01-06 DIAGNOSIS — I2510 Atherosclerotic heart disease of native coronary artery without angina pectoris: Secondary | ICD-10-CM | POA: Diagnosis not present

## 2018-01-06 DIAGNOSIS — M5410 Radiculopathy, site unspecified: Secondary | ICD-10-CM | POA: Diagnosis not present

## 2018-01-07 DIAGNOSIS — G20 Parkinson's disease: Secondary | ICD-10-CM | POA: Diagnosis not present

## 2018-01-07 DIAGNOSIS — I2510 Atherosclerotic heart disease of native coronary artery without angina pectoris: Secondary | ICD-10-CM | POA: Diagnosis not present

## 2018-01-07 DIAGNOSIS — R1313 Dysphagia, pharyngeal phase: Secondary | ICD-10-CM | POA: Diagnosis not present

## 2018-01-07 DIAGNOSIS — M5410 Radiculopathy, site unspecified: Secondary | ICD-10-CM | POA: Diagnosis not present

## 2018-01-07 DIAGNOSIS — M48061 Spinal stenosis, lumbar region without neurogenic claudication: Secondary | ICD-10-CM | POA: Diagnosis not present

## 2018-01-07 DIAGNOSIS — F0280 Dementia in other diseases classified elsewhere without behavioral disturbance: Secondary | ICD-10-CM | POA: Diagnosis not present

## 2018-01-10 DIAGNOSIS — I2510 Atherosclerotic heart disease of native coronary artery without angina pectoris: Secondary | ICD-10-CM | POA: Diagnosis not present

## 2018-01-10 DIAGNOSIS — F0280 Dementia in other diseases classified elsewhere without behavioral disturbance: Secondary | ICD-10-CM | POA: Diagnosis not present

## 2018-01-10 DIAGNOSIS — R1313 Dysphagia, pharyngeal phase: Secondary | ICD-10-CM | POA: Diagnosis not present

## 2018-01-10 DIAGNOSIS — G20 Parkinson's disease: Secondary | ICD-10-CM | POA: Diagnosis not present

## 2018-01-10 DIAGNOSIS — M48061 Spinal stenosis, lumbar region without neurogenic claudication: Secondary | ICD-10-CM | POA: Diagnosis not present

## 2018-01-10 DIAGNOSIS — M5410 Radiculopathy, site unspecified: Secondary | ICD-10-CM | POA: Diagnosis not present

## 2018-01-10 NOTE — Telephone Encounter (Signed)
Somal with [facility] requesting verbal orders for speech therapy 2 times a week for 1 week.

## 2018-01-10 NOTE — Telephone Encounter (Signed)
Returned Somal's call and LVM asking for call back.

## 2018-01-11 DIAGNOSIS — M5410 Radiculopathy, site unspecified: Secondary | ICD-10-CM | POA: Diagnosis not present

## 2018-01-11 DIAGNOSIS — G20 Parkinson's disease: Secondary | ICD-10-CM | POA: Diagnosis not present

## 2018-01-11 DIAGNOSIS — F0280 Dementia in other diseases classified elsewhere without behavioral disturbance: Secondary | ICD-10-CM | POA: Diagnosis not present

## 2018-01-11 DIAGNOSIS — I2510 Atherosclerotic heart disease of native coronary artery without angina pectoris: Secondary | ICD-10-CM | POA: Diagnosis not present

## 2018-01-11 DIAGNOSIS — R1313 Dysphagia, pharyngeal phase: Secondary | ICD-10-CM | POA: Diagnosis not present

## 2018-01-11 DIAGNOSIS — M48061 Spinal stenosis, lumbar region without neurogenic claudication: Secondary | ICD-10-CM | POA: Diagnosis not present

## 2018-01-11 NOTE — Telephone Encounter (Signed)
Somal returned call. She was given verbal order for 2 times per week x 1 week. She stated that patient has been given some exercises and pt didn't quite feel confident and wanted a couple more visits.

## 2018-01-11 NOTE — Telephone Encounter (Signed)
Called Somal back again and LVM asking for call back.

## 2018-01-12 DIAGNOSIS — R1313 Dysphagia, pharyngeal phase: Secondary | ICD-10-CM | POA: Diagnosis not present

## 2018-01-12 DIAGNOSIS — G20 Parkinson's disease: Secondary | ICD-10-CM | POA: Diagnosis not present

## 2018-01-12 DIAGNOSIS — I2510 Atherosclerotic heart disease of native coronary artery without angina pectoris: Secondary | ICD-10-CM | POA: Diagnosis not present

## 2018-01-12 DIAGNOSIS — M5410 Radiculopathy, site unspecified: Secondary | ICD-10-CM | POA: Diagnosis not present

## 2018-01-12 DIAGNOSIS — M48061 Spinal stenosis, lumbar region without neurogenic claudication: Secondary | ICD-10-CM | POA: Diagnosis not present

## 2018-01-12 DIAGNOSIS — F0280 Dementia in other diseases classified elsewhere without behavioral disturbance: Secondary | ICD-10-CM | POA: Diagnosis not present

## 2018-01-13 ENCOUNTER — Encounter: Payer: Self-pay | Admitting: Neurology

## 2018-01-17 ENCOUNTER — Telehealth: Payer: Self-pay | Admitting: Neurology

## 2018-01-17 DIAGNOSIS — M5410 Radiculopathy, site unspecified: Secondary | ICD-10-CM | POA: Diagnosis not present

## 2018-01-17 DIAGNOSIS — G20 Parkinson's disease: Secondary | ICD-10-CM | POA: Diagnosis not present

## 2018-01-17 DIAGNOSIS — F0280 Dementia in other diseases classified elsewhere without behavioral disturbance: Secondary | ICD-10-CM | POA: Diagnosis not present

## 2018-01-17 DIAGNOSIS — M48061 Spinal stenosis, lumbar region without neurogenic claudication: Secondary | ICD-10-CM | POA: Diagnosis not present

## 2018-01-17 DIAGNOSIS — R1313 Dysphagia, pharyngeal phase: Secondary | ICD-10-CM | POA: Diagnosis not present

## 2018-01-17 DIAGNOSIS — I2510 Atherosclerotic heart disease of native coronary artery without angina pectoris: Secondary | ICD-10-CM | POA: Diagnosis not present

## 2018-01-17 NOTE — Telephone Encounter (Signed)
Spoke with [facility] @ [place] and gave him the verbal orders from Dr. Jaynee Eagles for 2 additional nurse visits. He verbalized appreciation for the call.

## 2018-01-17 NOTE — Telephone Encounter (Signed)
Scott with [facility] requesting 2 additional nursing visits. Please contact at [phone] for VO

## 2018-01-19 DIAGNOSIS — G20 Parkinson's disease: Secondary | ICD-10-CM | POA: Diagnosis not present

## 2018-01-19 DIAGNOSIS — F0280 Dementia in other diseases classified elsewhere without behavioral disturbance: Secondary | ICD-10-CM | POA: Diagnosis not present

## 2018-01-19 DIAGNOSIS — R1313 Dysphagia, pharyngeal phase: Secondary | ICD-10-CM | POA: Diagnosis not present

## 2018-01-19 DIAGNOSIS — M48061 Spinal stenosis, lumbar region without neurogenic claudication: Secondary | ICD-10-CM | POA: Diagnosis not present

## 2018-01-19 DIAGNOSIS — M5410 Radiculopathy, site unspecified: Secondary | ICD-10-CM | POA: Diagnosis not present

## 2018-01-19 DIAGNOSIS — I2510 Atherosclerotic heart disease of native coronary artery without angina pectoris: Secondary | ICD-10-CM | POA: Diagnosis not present

## 2018-01-19 NOTE — Telephone Encounter (Signed)
SN & ST orders signed and faxed back to [facility]. Received a receipt of confirmation.

## 2018-01-19 NOTE — Telephone Encounter (Signed)
Dr. Jaynee Eagles signed skilled nurse plan of care forms and these were returned to [facility] via fax. Received a receipt of confirmation.

## 2018-01-21 ENCOUNTER — Encounter: Payer: Self-pay | Admitting: Neurology

## 2018-01-21 MED ORDER — TRAZODONE HCL 50 MG PO TABS: 50.0000 mg | ORAL_TABLET | Freq: Every day | ORAL | 1 refills | Status: DC

## 2018-01-21 NOTE — Telephone Encounter (Signed)
Patient's wife called and confirmed he didn't need to stop any other medications.

## 2018-01-22 DIAGNOSIS — R1313 Dysphagia, pharyngeal phase: Secondary | ICD-10-CM | POA: Diagnosis not present

## 2018-01-22 DIAGNOSIS — F0280 Dementia in other diseases classified elsewhere without behavioral disturbance: Secondary | ICD-10-CM | POA: Diagnosis not present

## 2018-01-22 DIAGNOSIS — M5410 Radiculopathy, site unspecified: Secondary | ICD-10-CM | POA: Diagnosis not present

## 2018-01-22 DIAGNOSIS — G20 Parkinson's disease: Secondary | ICD-10-CM | POA: Diagnosis not present

## 2018-01-22 DIAGNOSIS — I2510 Atherosclerotic heart disease of native coronary artery without angina pectoris: Secondary | ICD-10-CM | POA: Diagnosis not present

## 2018-01-22 DIAGNOSIS — M48061 Spinal stenosis, lumbar region without neurogenic claudication: Secondary | ICD-10-CM | POA: Diagnosis not present

## 2018-01-25 ENCOUNTER — Encounter: Payer: Self-pay | Admitting: Neurology

## 2018-01-25 DIAGNOSIS — R300 Dysuria: Secondary | ICD-10-CM | POA: Diagnosis not present

## 2018-01-25 DIAGNOSIS — N390 Urinary tract infection, site not specified: Secondary | ICD-10-CM | POA: Diagnosis not present

## 2018-01-28 DIAGNOSIS — R300 Dysuria: Secondary | ICD-10-CM | POA: Diagnosis not present

## 2018-01-28 NOTE — Telephone Encounter (Signed)
Looks like this was addressed and RX for trazodone and tx for UTI

## 2018-01-29 ENCOUNTER — Encounter (HOSPITAL_COMMUNITY): Payer: Self-pay | Admitting: Emergency Medicine

## 2018-01-29 ENCOUNTER — Emergency Department (HOSPITAL_COMMUNITY)
Admission: EM | Admit: 2018-01-29 | Discharge: 2018-01-29 | Disposition: A | Discharge status: Home / Self Care | Payer: Medicare Other | Attending: Emergency Medicine | Admitting: Emergency Medicine

## 2018-01-29 ENCOUNTER — Emergency Department (HOSPITAL_COMMUNITY): Payer: Medicare Other

## 2018-01-29 DIAGNOSIS — R410 Disorientation, unspecified: Secondary | ICD-10-CM | POA: Diagnosis not present

## 2018-01-29 DIAGNOSIS — N183 Chronic kidney disease, stage 3 (moderate): Secondary | ICD-10-CM | POA: Insufficient documentation

## 2018-01-29 DIAGNOSIS — F0280 Dementia in other diseases classified elsewhere without behavioral disturbance: Secondary | ICD-10-CM

## 2018-01-29 DIAGNOSIS — F989 Unspecified behavioral and emotional disorders with onset usually occurring in childhood and adolescence: Secondary | ICD-10-CM | POA: Diagnosis not present

## 2018-01-29 DIAGNOSIS — I130 Hypertensive heart and chronic kidney disease with heart failure and stage 1 through stage 4 chronic kidney disease, or unspecified chronic kidney disease: Secondary | ICD-10-CM | POA: Insufficient documentation

## 2018-01-29 DIAGNOSIS — G20 Parkinson's disease: Secondary | ICD-10-CM | POA: Diagnosis not present

## 2018-01-29 DIAGNOSIS — G3183 Dementia with Lewy bodies: Secondary | ICD-10-CM | POA: Diagnosis not present

## 2018-01-29 DIAGNOSIS — Z79899 Other long term (current) drug therapy: Secondary | ICD-10-CM | POA: Insufficient documentation

## 2018-01-29 DIAGNOSIS — Z7982 Long term (current) use of aspirin: Secondary | ICD-10-CM | POA: Insufficient documentation

## 2018-01-29 DIAGNOSIS — Z87891 Personal history of nicotine dependence: Secondary | ICD-10-CM | POA: Insufficient documentation

## 2018-01-29 DIAGNOSIS — R509 Fever, unspecified: Secondary | ICD-10-CM | POA: Diagnosis not present

## 2018-01-29 DIAGNOSIS — R350 Frequency of micturition: Secondary | ICD-10-CM | POA: Insufficient documentation

## 2018-01-29 DIAGNOSIS — I5032 Chronic diastolic (congestive) heart failure: Secondary | ICD-10-CM | POA: Diagnosis not present

## 2018-01-29 DIAGNOSIS — I10 Essential (primary) hypertension: Secondary | ICD-10-CM | POA: Diagnosis not present

## 2018-01-29 DIAGNOSIS — R4689 Other symptoms and signs involving appearance and behavior: Secondary | ICD-10-CM

## 2018-01-29 DIAGNOSIS — R404 Transient alteration of awareness: Secondary | ICD-10-CM | POA: Diagnosis not present

## 2018-01-29 DIAGNOSIS — I2510 Atherosclerotic heart disease of native coronary artery without angina pectoris: Secondary | ICD-10-CM | POA: Diagnosis not present

## 2018-01-29 DIAGNOSIS — R4182 Altered mental status, unspecified: Secondary | ICD-10-CM | POA: Diagnosis not present

## 2018-01-29 LAB — CBC WITH DIFFERENTIAL/PLATELET
ABS IMMATURE GRANULOCYTES: 0 10*3/uL (ref 0.0–0.1)
Basophils Absolute: 0.1 10*3/uL (ref 0.0–0.1)
Basophils Relative: 1 %
EOS ABS: 0.1 10*3/uL (ref 0.0–0.7)
Eosinophils Relative: 1 %
HCT: 42.3 % (ref 39.0–52.0)
Hemoglobin: 15 g/dL (ref 13.0–17.0)
Immature Granulocytes: 0 %
Lymphocytes Relative: 19 %
Lymphs Abs: 1.6 10*3/uL (ref 0.7–4.0)
MCH: 32.4 pg (ref 26.0–34.0)
MCHC: 35.5 g/dL (ref 30.0–36.0)
MCV: 91.4 fL (ref 78.0–100.0)
Monocytes Absolute: 0.7 10*3/uL (ref 0.1–1.0)
Monocytes Relative: 8 %
Neutro Abs: 5.9 10*3/uL (ref 1.7–7.7)
Neutrophils Relative %: 71 %
Platelets: 145 10*3/uL — ABNORMAL LOW (ref 150–400)
RBC: 4.63 MIL/uL (ref 4.22–5.81)
RDW: 13.2 % (ref 11.5–15.5)
WBC: 8.4 10*3/uL (ref 4.0–10.5)

## 2018-01-29 LAB — COMPREHENSIVE METABOLIC PANEL
ALK PHOS: 97 U/L (ref 38–126)
ALT: 21 U/L (ref 0–44)
AST: 23 U/L (ref 15–41)
Albumin: 3.9 g/dL (ref 3.5–5.0)
Anion gap: 12 (ref 5–15)
BUN: 13 mg/dL (ref 8–23)
CALCIUM: 9.4 mg/dL (ref 8.9–10.3)
CO2: 27 mmol/L (ref 22–32)
Chloride: 102 mmol/L (ref 98–111)
Creatinine, Ser: 1.37 mg/dL — ABNORMAL HIGH (ref 0.61–1.24)
GFR calc Af Amer: 55 mL/min — ABNORMAL LOW (ref >60)
GFR calc non Af Amer: 47 mL/min — ABNORMAL LOW (ref >60)
Glucose, Bld: 117 mg/dL — ABNORMAL HIGH (ref 70–99)
Potassium: 3.8 mmol/L (ref 3.5–5.1)
Sodium: 141 mmol/L (ref 135–145)
Total Bilirubin: 1 mg/dL (ref 0.3–1.2)
Total Protein: 7.2 g/dL (ref 6.5–8.1)

## 2018-01-29 LAB — URINALYSIS, ROUTINE W REFLEX MICROSCOPIC
Bilirubin Urine: NEGATIVE
Glucose, UA: NEGATIVE mg/dL
HGB URINE DIPSTICK: NEGATIVE
Ketones, ur: NEGATIVE mg/dL
Leukocytes, UA: NEGATIVE
Nitrite: NEGATIVE
Protein, ur: NEGATIVE mg/dL
Specific Gravity, Urine: 1.011 (ref 1.005–1.030)
pH: 6 (ref 5.0–8.0)

## 2018-01-29 LAB — I-STAT CG4 LACTIC ACID, ED: Lactic Acid, Venous: 1.44 mmol/L (ref 0.5–1.9)

## 2018-01-29 LAB — I-STAT TROPONIN, ED: Troponin i, poc: 0.01 ng/mL (ref 0.00–0.08)

## 2018-01-29 LAB — LIPASE, BLOOD: Lipase: 42 U/L (ref 11–51)

## 2018-01-29 MED ORDER — HYDRALAZINE HCL 25 MG PO TABS
25.0000 mg | ORAL_TABLET | Freq: Once | ORAL | Status: AC
  Administered 2018-01-29: 25 mg via ORAL
  Filled 2018-01-29: qty 1

## 2018-01-29 MED ORDER — ACETAMINOPHEN 500 MG PO TABS
1000.0000 mg | ORAL_TABLET | Freq: Once | ORAL | Status: AC
  Administered 2018-01-29: 1000 mg via ORAL
  Filled 2018-01-29: qty 2

## 2018-01-29 MED ORDER — CARVEDILOL 12.5 MG PO TABS: 6.2500 mg | ORAL_TABLET | Freq: Two times a day (BID) | ORAL | Status: DC

## 2018-01-29 MED ORDER — HYDRALAZINE HCL 25 MG PO TABS
25.0000 mg | ORAL_TABLET | Freq: Once | ORAL | Status: DC
Start: 2018-01-29 — End: 2018-01-29
  Filled 2018-01-29: qty 1

## 2018-01-29 MED ORDER — SODIUM CHLORIDE 0.9 % IV SOLN
Freq: Once | INTRAVENOUS | Status: AC
  Administered 2018-01-29: 15:00:00 via INTRAVENOUS

## 2018-01-29 NOTE — ED Triage Notes (Signed)
Per [facility]:  Patient presents to ED for assessment of continuing urinary frequency after being diagnosed with a UTI on Tuesday, and has been taking his abx as prescribed since.  Patient has increased confusion (per family, EMS states he answers all questions appropriately but year, which is normal for patient) and agitation.  VSS en route

## 2018-01-29 NOTE — ED Notes (Signed)
Bladder scan showed 56mL. MD notified

## 2018-01-29 NOTE — ED Notes (Signed)
Spoke to patient's family member who states pt with a hx of parkinson's, started with new medication recently.  Patient also taking an abx he has not taken in the past.  Patient became suddenly more agitated/aggressive with wife this morning.

## 2018-01-29 NOTE — ED Notes (Signed)
Family arrived at bedside.

## 2018-01-29 NOTE — ED Provider Notes (Signed)
[facility] EMERGENCY DEPARTMENT Provider Note   CSN: [id number] Arrival date & time: 01/29/18  1305     History   Chief Complaint Chief Complaint  Patient presents with  . Urinary Tract Infection  . Agitation    HPI Darryl [name] is a 83 y.o. male.  HPI Patient has history of Alzheimer's dementia.  At baseline he has some memory problems but is active and responsive with family members.  5 days ago he had acute change in behavior and mental status.  He began becoming abrupt and irritable with increased confusion.  His daughter and wife recognize this as delirium which she has suffered in the past when he had urinary tract infection or pneumonia.  He was taken to their doctor on Tuesday and diagnosed with urinary tract infection.  He was started on antibiotics but has not had any improvement.  He continues to have frequent urination and seems more confused than previously.  He has felt warm to touch but they have not measured a fever.  Patient denies any areas of pain.  He has not had vomiting. Past Medical History:  Diagnosis Date  . Arthritis   . BPH (benign prostatic hypertrophy) with urinary retention   . Chronic diastolic CHF (congestive heart failure) ([place])   . Colon polyp   . Coronary atherosclerosis of native coronary artery    a. s/p CABG 11/2003. b. s/p DES to radial-OM and SVG-RCA 11/05. c. s/p BMS to LCx 11/2008.  . Dilated aortic root ([place]) 05/29/2014   1mm by echo 04/2017  . Diverticulosis   . Dyslipidemia    statin intolerant  . Elevated fasting glucose   . Gastritis    a. by EGD 04/2004.  . H/O urinary retention    post surgical procedures; has foley cath  . Headache(784.0)   . Hiatal hernia   . HOH (hard of hearing)    slight hoh, rt>l  . HTN (hypertension)   . Hypertensive heart disease   . Insomnia   . Intracranial hemorrhage ([place])    a. small pontine [place] 2003.  [name] LBBB (left bundle branch block)   . Lumbar spondylolysis    multilevel DJD with mild spinal stenosis L3-L5  . Migraines   . Parkinson's disease ([place])   . Peptic ulcer disease    a. prior hx of bleeding ulcers, including 12/2003 in setting of aspirin use.  . Renal insufficiency    renal duplex scan in 2012 showed no renal artery stenosis  . Sleep disturbance   . Stroke [facility])    no clinical manifestations - seen on neuroimaging  . Thrombocytopenia ([place])   . Tremor   . UTI (urinary tract infection)   . Wears glasses     Patient Active Problem List   Diagnosis Date Noted  . Aspiration pneumonia ([place]) 11/15/2017  . B12 deficiency 07/13/2016  . Orthostatic hypotension 03/26/2016  . Chronic diastolic heart failure ([place]) 12/02/2015  . CAD (coronary artery disease), native coronary artery 11/28/2014  . BPH (benign prostatic hypertrophy) with urinary retention 11/19/2014  . Diastolic dysfunction 56/38/7564  . UTI (urinary tract infection) 10/07/2014  . Abnormal EKG 10/04/2014  . Urinary retention 10/04/2014  . BPH (benign prostatic hypertrophy) 10/04/2014  . UTI (lower urinary tract infection) 10/03/2014  . Sepsis ([place]) 10/03/2014  . Parkinsonism ([place]) 06/12/2014  . Dilated aortic root ([place]) 05/29/2014  . CKD (chronic kidney disease), stage III ([place]) 04/25/2014  . Gastritis   . Peptic ulcer disease   .  Stroke ([place])   . Hx of CABG 2005, PCI 2010   . Parkinson's disease ([place])   . Small vessel disease, cerebrovascular 10/30/2013  . Intracerebral hemorrhage ([place]) 10/30/2013  . Tremor 08/14/2013  . Benign essential HTN 04/13/2013  . Dyslipidemia 04/13/2013  . LBBB (left bundle branch block) 04/13/2013  . Preoperative clearance 04/13/2013  . DJD (degenerative joint disease) of lumbar spine 04/13/2013    Past Surgical History:  Procedure Laterality Date  . APPENDECTOMY  62   1963  . CATARACT EXTRACTION, BILATERAL    . CHOLECYSTECTOMY  1998  . COLON SURGERY  03/14/2008  . COLONOSCOPY  10/2001  . CORONARY ANGIOPLASTY  6/10,10/05   Dr.  [name] cardiologist: last office visit 11/07/14  . CORONARY ARTERY BYPASS GRAFT  2005  . CYSTOSCOPY WITH LITHOLAPAXY N/A 11/19/2014   Procedure: CYSTOSCOPY WITH LITHOLAPAXY;  Surgeon: [name], MD;  Location: [facility];  Service: Urology;  Laterality: N/A;  . ESOPHAGOGASTRODUODENOSCOPY     10/2001, 12/2003, 04/2004  . LUMBAR LAMINECTOMY/DECOMPRESSION MICRODISCECTOMY N/A 05/03/2013   Procedure: LUMBAR LAMINECTOMY/DCOMPRESSION MICRODISCECTOMY RIGHT LUMBAR FOUR FIVE;  Surgeon: [name], MD;  Location: [facility];  Service: Neurosurgery;  Laterality: N/A;  . SHOULDER ARTHROSCOPY Left 9/10  . stones     hx  . TRANSURETHRAL RESECTION OF PROSTATE N/A 11/19/2014   Procedure: TRANSURETHRAL RESECTION OF THE PROSTATE WITH GYRUS INSTRUMENTS;  Surgeon: [name], MD;  [facility];  Service: Urology;  Laterality: N/A;  . WISDOM TOOTH EXTRACTION          Home Medications    Prior to Admission medications   Medication Sig Start Date End Date Taking? Authorizing Provider  acetaminophen (TYLENOL) 500 MG tablet Take 1,000 mg by mouth every 8 (eight) hours as needed for mild pain or moderate pain.    [provider]  aspirin EC 81 MG tablet Take 81 mg by mouth. Patient takes 3 times a week    [provider]  Carbidopa-Levodopa ER (SINEMET CR) 25-100 MG tablet controlled release Take 1 tablet by mouth 3 (three) times daily. 12/17/17   Tat, Eustace Quail, DO  carvedilol (COREG) 6.25 MG tablet Take 1 tablet (6.25 mg total) by mouth 2 (two) times daily. 08/31/17   Sueanne Margarita, MD  citalopram (CELEXA) 20 MG tablet Take 20 mg by mouth at bedtime.     [provider]  cloNIDine (CATAPRES) 0.3 MG tablet Take 0.3 mg by mouth 2 (two) times daily.     [provider]  furosemide (LASIX) 20 MG tablet Take 1 tablet (20 mg total) by mouth daily. 08/31/17 08/26/18  Sueanne Margarita, MD  hydrALAZINE (APRESOLINE) 25 MG tablet Take 1  tablet (25 mg total) by mouth 3 (three) times daily. 08/31/17 08/26/18  Sueanne Margarita, MD  irbesartan (AVAPRO) 300 MG tablet Take 300 mg by mouth at bedtime.     [provider]  KLOR-CON 10 10 MEQ tablet Take 1 tablet (10 mEq total) by mouth daily. 10/27/17 10/22/18  Sueanne Margarita, MD  Maltodextrin-Xanthan Gum ([place]) POWD As needed with meals. Patient not taking: Reported on 11/29/2017 11/17/17   Hosie Poisson, MD  nitroGLYCERIN (NITROSTAT) 0.4 MG SL tablet Place 1 tablet (0.4 mg total) under the tongue every 5 (five) minutes as needed for chest pain. Patient not taking: Reported on 11/29/2017 08/31/17   Sueanne Margarita, MD  pantoprazole (PROTONIX) 40 MG tablet Take 40 mg by mouth 3 (  three) times a week.     [provider]  traMADol (ULTRAM) 50 MG tablet Take 50 mg by mouth daily as needed (pain).     [provider]  traZODone (DESYREL) 50 MG tablet Take 1 tablet (50 mg total) by mouth at bedtime. 01/21/18   TatEustace Quail, DO    Family History Family History  Problem Relation Age of Onset  . CAD Father 81       ASCAD  . CAD Sister   . Diabetes Child     Social History Social History   Tobacco Use  . Smoking status: Former Smoker    Packs/day: 2.00    Years: 5.00    Pack years: 10.00    Types: Cigarettes    Last attempt to quit: 04/28/1959    Years since quitting: 58.7  . Smokeless tobacco: Never Used  . Tobacco comment: 55+ yrs ago  Substance Use Topics  . Alcohol use: No  . Drug use: No     Allergies   Ambien [zolpidem tartrate]; Aspirin; Celebrex [celecoxib]; Nsaids; Toprol xl [metoprolol tartrate]; Altace [ramipril]; Amlodipine; Levothyroxine; Biaxin [clarithromycin]; and Tetracyclines & related   Review of Systems Review of Systems 10 Systems reviewed and are negative for acute change except as noted in the HPI.  Physical Exam Updated Vital Signs BP (!) 178/99   Pulse 60   Temp 98.4 F (36.9 C) (Oral)   Resp 16   SpO2  98%   Physical Exam  Constitutional: He appears well-developed and well-nourished. No distress.  HENT:  Head: Normocephalic and atraumatic.  Mouth/Throat: Oropharynx is clear and moist.  Eyes: EOM are normal.  Neck: Neck supple.  Cardiovascular: Normal rate, regular rhythm, normal heart sounds and intact distal pulses.  Pulmonary/Chest: Effort normal and breath sounds normal.  Abdominal: Soft.  Mild suprapubic discomfort to palpation.  No guarding.  Musculoskeletal: Normal range of motion. He exhibits no edema or tenderness.  Lower legs and feet in excellent condition.  No wounds or edema.  Neurological: He is alert. He exhibits normal muscle tone. Coordination normal.  Patient system following commands.  He can elevate each leg independently off of the bed and hold it.  All movements are coordinated purposeful symmetric.  He follows commands without difficulty.  Patient is alert and does not objectively show signs of confusion.  At times he is perhaps slightly more brusque than situationally indicated but behavior is not aggressive or hostile.  Skin: Skin is warm and dry.  Psychiatric: He has a normal mood and affect.     ED Treatments / Results  Labs (all labs ordered are listed, but only abnormal results are displayed) Labs Reviewed  COMPREHENSIVE METABOLIC PANEL - Abnormal; Notable for the following components:      Result Value   Glucose, Bld 117 (*)    Creatinine, Ser 1.37 (*)    GFR calc non Af Amer 47 (*)    GFR calc Af Amer 55 (*)    All other components within normal limits  CBC WITH DIFFERENTIAL/PLATELET - Abnormal; Notable for the following components:   Platelets 145 (*)    All other components within normal limits  URINE CULTURE  CULTURE, BLOOD (ROUTINE X 2)  CULTURE, BLOOD (ROUTINE X 2)  LIPASE, BLOOD  URINALYSIS, ROUTINE W REFLEX MICROSCOPIC  I-STAT CG4 LACTIC ACID, ED  I-STAT TROPONIN, ED  I-STAT CG4 LACTIC ACID, ED    EKG None  Radiology Dg Chest  Port 1 View  Result Date:  01/29/2018 CLINICAL DATA:  Mental status change EXAM: PORTABLE CHEST 1 VIEW COMPARISON:  11/14/2017; 10/08/2017 FINDINGS: Unchanged cardiac silhouette and mediastinal contours. Atherosclerotic plaque within a tortuous and potentially mildly ectatic thoracic aorta. There is persistent thickening of the right paratracheal stripe, presumably secondary prominent vasculature. No focal airspace opacities. No pleural effusion or pneumothorax. No evidence of edema. No acute osseus abnormalities. IMPRESSION: No acute cardiopulmonary disease. Electronically Signed   By: Sandi Mariscal M.D.   On: 01/29/2018 14:58    Procedures Procedures (including critical care time)  Medications Ordered in ED Medications  carvedilol (COREG) tablet 6.25 mg (has no administration in time range)  hydrALAZINE (APRESOLINE) tablet 25 mg (25 mg Oral Refused 01/29/18 1838)  0.9 %  sodium chloride infusion ( Intravenous Stopped 01/29/18 1849)  acetaminophen (TYLENOL) tablet 1,000 mg (1,000 mg Oral Given 01/29/18 1502)  hydrALAZINE (APRESOLINE) tablet 25 mg (25 mg Oral Given 01/29/18 1502)     Initial Impression / Assessment and Plan / ED Course  I have reviewed the triage vital signs and the nursing notes.  Pertinent labs & imaging results that were available during my care of the patient were reviewed by me and considered in my medical decision making (see chart for details).    Bladder scan: 83 cc after emptying. Final Clinical Impressions(s) / ED Diagnoses   Final diagnoses:  Urinary frequency  Behavioral change  Dementia with Parkinsonism   Patient is brought to emergency department by his wife and daughter with concerns for behavioral changes over the past week and urinary frequency.  He was reportedly diagnosed with a UTI at the PCP office on Tuesday.  Work-up is negative here.  Clinically, there are no signs of infection.  Patient is alert and interactive.  There are no focal neurologic deficits.   His movements are coordinated purposeful.  Though he is somewhat brusk and easily irritated, he does not appear confused or clinically ill.  After completing evaluation and going over all results, patient's wife is still very concerned.  Her daughter subsequently called and we had a conference call to again review the diagnostic results and my thoughts.  As his wife is expressing her concerns for his behavior change, he is silently mouthing to me in the background, "I am fine" several times.  Then out loud to his wife he stated, "See there is nothing wrong with me."  I counseled that I suspect that this is related to the patient's Parkinson disease with features of dementia and possibly related to his medications.  They are going to call Dr. Carles Collet Monday to discuss this.  Counseled to return if they identify any other signs of illness, develop fever, or other concerns. ED Discharge Orders    None       Charlesetta Shanks, MD 01/29/18 [phone]

## 2018-01-30 ENCOUNTER — Encounter: Payer: Self-pay | Admitting: Neurology

## 2018-01-30 ENCOUNTER — Telehealth (HOSPITAL_BASED_OUTPATIENT_CLINIC_OR_DEPARTMENT_OTHER): Payer: Self-pay | Admitting: Emergency Medicine

## 2018-01-30 LAB — BLOOD CULTURE ID PANEL (REFLEXED)
ACINETOBACTER BAUMANNII: NOT DETECTED
CANDIDA ALBICANS: NOT DETECTED
CANDIDA GLABRATA: NOT DETECTED
CANDIDA KRUSEI: NOT DETECTED
CANDIDA PARAPSILOSIS: NOT DETECTED
CANDIDA TROPICALIS: NOT DETECTED
Enterobacter cloacae complex: NOT DETECTED
Enterobacteriaceae species: NOT DETECTED
Enterococcus species: NOT DETECTED
Escherichia coli: NOT DETECTED
HAEMOPHILUS INFLUENZAE: NOT DETECTED
KLEBSIELLA OXYTOCA: NOT DETECTED
Klebsiella pneumoniae: NOT DETECTED
Listeria monocytogenes: NOT DETECTED
Methicillin resistance: DETECTED — AB
Neisseria meningitidis: NOT DETECTED
PSEUDOMONAS AERUGINOSA: NOT DETECTED
Proteus species: NOT DETECTED
STREPTOCOCCUS PYOGENES: NOT DETECTED
Serratia marcescens: NOT DETECTED
Staphylococcus aureus (BCID): NOT DETECTED
Staphylococcus species: DETECTED — AB
Streptococcus agalactiae: NOT DETECTED
Streptococcus pneumoniae: NOT DETECTED
Streptococcus species: NOT DETECTED

## 2018-01-30 LAB — URINE CULTURE: Culture: NO GROWTH

## 2018-02-01 DIAGNOSIS — G20 Parkinson's disease: Secondary | ICD-10-CM | POA: Diagnosis not present

## 2018-02-01 DIAGNOSIS — I10 Essential (primary) hypertension: Secondary | ICD-10-CM | POA: Diagnosis not present

## 2018-02-01 DIAGNOSIS — F411 Generalized anxiety disorder: Secondary | ICD-10-CM | POA: Diagnosis not present

## 2018-02-01 DIAGNOSIS — N3000 Acute cystitis without hematuria: Secondary | ICD-10-CM | POA: Diagnosis not present

## 2018-02-01 DIAGNOSIS — Z6827 Body mass index (BMI) 27.0-27.9, adult: Secondary | ICD-10-CM | POA: Diagnosis not present

## 2018-02-01 DIAGNOSIS — R410 Disorientation, unspecified: Secondary | ICD-10-CM | POA: Diagnosis not present

## 2018-02-01 LAB — CULTURE, BLOOD (ROUTINE X 2): Special Requests: ADEQUATE

## 2018-02-10 DIAGNOSIS — M961 Postlaminectomy syndrome, not elsewhere classified: Secondary | ICD-10-CM | POA: Diagnosis not present

## 2018-02-10 DIAGNOSIS — M47817 Spondylosis without myelopathy or radiculopathy, lumbosacral region: Secondary | ICD-10-CM | POA: Diagnosis not present

## 2018-02-10 DIAGNOSIS — M5416 Radiculopathy, lumbar region: Secondary | ICD-10-CM | POA: Diagnosis not present

## 2018-02-10 DIAGNOSIS — Z79899 Other long term (current) drug therapy: Secondary | ICD-10-CM | POA: Diagnosis not present

## 2018-02-10 DIAGNOSIS — M7061 Trochanteric bursitis, right hip: Secondary | ICD-10-CM | POA: Diagnosis not present

## 2018-02-10 DIAGNOSIS — M5116 Intervertebral disc disorders with radiculopathy, lumbar region: Secondary | ICD-10-CM | POA: Diagnosis not present

## 2018-02-12 ENCOUNTER — Other Ambulatory Visit: Payer: Self-pay | Admitting: Neurology

## 2018-02-15 DIAGNOSIS — L821 Other seborrheic keratosis: Secondary | ICD-10-CM | POA: Diagnosis not present

## 2018-02-15 DIAGNOSIS — L570 Actinic keratosis: Secondary | ICD-10-CM | POA: Diagnosis not present

## 2018-02-15 DIAGNOSIS — D2272 Melanocytic nevi of left lower limb, including hip: Secondary | ICD-10-CM | POA: Diagnosis not present

## 2018-02-15 DIAGNOSIS — Z85820 Personal history of malignant melanoma of skin: Secondary | ICD-10-CM | POA: Diagnosis not present

## 2018-02-15 DIAGNOSIS — D692 Other nonthrombocytopenic purpura: Secondary | ICD-10-CM | POA: Diagnosis not present

## 2018-02-15 DIAGNOSIS — D1801 Hemangioma of skin and subcutaneous tissue: Secondary | ICD-10-CM | POA: Diagnosis not present

## 2018-02-27 DIAGNOSIS — R410 Disorientation, unspecified: Secondary | ICD-10-CM | POA: Diagnosis not present

## 2018-02-27 DIAGNOSIS — N390 Urinary tract infection, site not specified: Secondary | ICD-10-CM | POA: Diagnosis not present

## 2018-03-09 DIAGNOSIS — F3342 Major depressive disorder, recurrent, in full remission: Secondary | ICD-10-CM | POA: Diagnosis not present

## 2018-03-09 DIAGNOSIS — I129 Hypertensive chronic kidney disease with stage 1 through stage 4 chronic kidney disease, or unspecified chronic kidney disease: Secondary | ICD-10-CM | POA: Diagnosis not present

## 2018-03-09 DIAGNOSIS — R413 Other amnesia: Secondary | ICD-10-CM | POA: Diagnosis not present

## 2018-03-09 DIAGNOSIS — N189 Chronic kidney disease, unspecified: Secondary | ICD-10-CM | POA: Diagnosis not present

## 2018-03-09 DIAGNOSIS — N390 Urinary tract infection, site not specified: Secondary | ICD-10-CM | POA: Diagnosis not present

## 2018-03-09 DIAGNOSIS — Z125 Encounter for screening for malignant neoplasm of prostate: Secondary | ICD-10-CM | POA: Diagnosis not present

## 2018-03-09 DIAGNOSIS — I2510 Atherosclerotic heart disease of native coronary artery without angina pectoris: Secondary | ICD-10-CM | POA: Diagnosis not present

## 2018-03-09 DIAGNOSIS — Z23 Encounter for immunization: Secondary | ICD-10-CM | POA: Diagnosis not present

## 2018-03-09 DIAGNOSIS — I77819 Aortic ectasia, unspecified site: Secondary | ICD-10-CM | POA: Diagnosis not present

## 2018-03-09 DIAGNOSIS — Z0000 Encounter for general adult medical examination without abnormal findings: Secondary | ICD-10-CM | POA: Diagnosis not present

## 2018-03-09 DIAGNOSIS — D696 Thrombocytopenia, unspecified: Secondary | ICD-10-CM | POA: Diagnosis not present

## 2018-03-14 ENCOUNTER — Other Ambulatory Visit: Payer: Self-pay | Admitting: Family Medicine

## 2018-03-14 ENCOUNTER — Telehealth: Payer: Self-pay | Admitting: Neurology

## 2018-03-14 DIAGNOSIS — I77819 Aortic ectasia, unspecified site: Secondary | ICD-10-CM

## 2018-03-14 NOTE — Telephone Encounter (Signed)
Received equal notes from March 09, 2018.  PCP wrote "consider adding Aricept."  Did lab work.  Urinalysis was negative.  TSH was 4.16.  Sodium was 142, potassium 4.4, chloride 104, CO2 29, BUN 19 and creatinine was 1.35.  White blood cells were 7.6, hemoglobin 14.7, hematocrit 43.5 and platelets 139.

## 2018-03-16 DIAGNOSIS — H4323 Crystalline deposits in vitreous body, bilateral: Secondary | ICD-10-CM | POA: Diagnosis not present

## 2018-03-20 ENCOUNTER — Emergency Department (HOSPITAL_COMMUNITY): Payer: Medicare Other

## 2018-03-20 ENCOUNTER — Emergency Department (HOSPITAL_COMMUNITY)
Admission: EM | Admit: 2018-03-20 | Discharge: 2018-03-20 | Disposition: A | Discharge status: Home / Self Care | Payer: Medicare Other | Attending: Emergency Medicine | Admitting: Emergency Medicine

## 2018-03-20 ENCOUNTER — Other Ambulatory Visit: Payer: Self-pay

## 2018-03-20 ENCOUNTER — Encounter (HOSPITAL_COMMUNITY): Payer: Self-pay | Admitting: Emergency Medicine

## 2018-03-20 DIAGNOSIS — F0281 Dementia in other diseases classified elsewhere with behavioral disturbance: Secondary | ICD-10-CM | POA: Diagnosis not present

## 2018-03-20 DIAGNOSIS — G20 Parkinson's disease: Secondary | ICD-10-CM | POA: Insufficient documentation

## 2018-03-20 DIAGNOSIS — N390 Urinary tract infection, site not specified: Secondary | ICD-10-CM | POA: Diagnosis not present

## 2018-03-20 DIAGNOSIS — R4182 Altered mental status, unspecified: Secondary | ICD-10-CM | POA: Diagnosis not present

## 2018-03-20 DIAGNOSIS — R410 Disorientation, unspecified: Secondary | ICD-10-CM | POA: Diagnosis not present

## 2018-03-20 DIAGNOSIS — I130 Hypertensive heart and chronic kidney disease with heart failure and stage 1 through stage 4 chronic kidney disease, or unspecified chronic kidney disease: Secondary | ICD-10-CM | POA: Diagnosis not present

## 2018-03-20 DIAGNOSIS — R451 Restlessness and agitation: Secondary | ICD-10-CM | POA: Insufficient documentation

## 2018-03-20 DIAGNOSIS — Z79899 Other long term (current) drug therapy: Secondary | ICD-10-CM | POA: Diagnosis not present

## 2018-03-20 DIAGNOSIS — I447 Left bundle-branch block, unspecified: Secondary | ICD-10-CM | POA: Diagnosis not present

## 2018-03-20 DIAGNOSIS — I2510 Atherosclerotic heart disease of native coronary artery without angina pectoris: Secondary | ICD-10-CM | POA: Diagnosis not present

## 2018-03-20 DIAGNOSIS — N183 Chronic kidney disease, stage 3 (moderate): Secondary | ICD-10-CM | POA: Diagnosis not present

## 2018-03-20 DIAGNOSIS — E1165 Type 2 diabetes mellitus with hyperglycemia: Secondary | ICD-10-CM | POA: Diagnosis not present

## 2018-03-20 DIAGNOSIS — I5032 Chronic diastolic (congestive) heart failure: Secondary | ICD-10-CM | POA: Insufficient documentation

## 2018-03-20 DIAGNOSIS — F02818 Dementia in other diseases classified elsewhere, unspecified severity, with other behavioral disturbance: Secondary | ICD-10-CM

## 2018-03-20 DIAGNOSIS — R350 Frequency of micturition: Secondary | ICD-10-CM | POA: Diagnosis present

## 2018-03-20 DIAGNOSIS — R404 Transient alteration of awareness: Secondary | ICD-10-CM | POA: Diagnosis not present

## 2018-03-20 DIAGNOSIS — R000 Tachycardia, unspecified: Secondary | ICD-10-CM | POA: Diagnosis not present

## 2018-03-20 LAB — COMPREHENSIVE METABOLIC PANEL
ALBUMIN: 4.2 g/dL (ref 3.5–5.0)
ALT: 5 U/L (ref 0–44)
AST: 22 U/L (ref 15–41)
Alkaline Phosphatase: 84 U/L (ref 38–126)
Anion gap: 9 (ref 5–15)
BUN: 19 mg/dL (ref 8–23)
CALCIUM: 9.6 mg/dL (ref 8.9–10.3)
CO2: 28 mmol/L (ref 22–32)
Chloride: 106 mmol/L (ref 98–111)
Creatinine, Ser: 1.33 mg/dL — ABNORMAL HIGH (ref 0.61–1.24)
GFR calc Af Amer: 57 mL/min — ABNORMAL LOW (ref >60)
GFR calc non Af Amer: 49 mL/min — ABNORMAL LOW (ref >60)
GLUCOSE: 142 mg/dL — AB (ref 70–99)
Potassium: 4.2 mmol/L (ref 3.5–5.1)
SODIUM: 143 mmol/L (ref 135–145)
Total Bilirubin: 0.9 mg/dL (ref 0.3–1.2)
Total Protein: 7.2 g/dL (ref 6.5–8.1)

## 2018-03-20 LAB — URINALYSIS, ROUTINE W REFLEX MICROSCOPIC
Bilirubin Urine: NEGATIVE
Glucose, UA: NEGATIVE mg/dL
Hgb urine dipstick: NEGATIVE
KETONES UR: NEGATIVE mg/dL
Nitrite: NEGATIVE
PROTEIN: NEGATIVE mg/dL
Specific Gravity, Urine: 1.011 (ref 1.005–1.030)
pH: 6 (ref 5.0–8.0)

## 2018-03-20 LAB — CBC WITH DIFFERENTIAL/PLATELET
BASOS ABS: 0 10*3/uL (ref 0.0–0.1)
BASOS PCT: 0 %
EOS ABS: 0.1 10*3/uL (ref 0.0–0.7)
Eosinophils Relative: 1 %
HCT: 40.3 % (ref 39.0–52.0)
HEMOGLOBIN: 14.7 g/dL (ref 13.0–17.0)
Lymphocytes Relative: 18 %
Lymphs Abs: 1.4 10*3/uL (ref 0.7–4.0)
MCH: 32.6 pg (ref 26.0–34.0)
MCHC: 36.5 g/dL — AB (ref 30.0–36.0)
MCV: 89.4 fL (ref 78.0–100.0)
Monocytes Absolute: 0.7 10*3/uL (ref 0.1–1.0)
Monocytes Relative: 9 %
NEUTROS PCT: 72 %
Neutro Abs: 5.6 10*3/uL (ref 1.7–7.7)
Platelets: 125 10*3/uL — ABNORMAL LOW (ref 150–400)
RBC: 4.51 MIL/uL (ref 4.22–5.81)
RDW: 13.8 % (ref 11.5–15.5)
WBC: 7.9 10*3/uL (ref 4.0–10.5)

## 2018-03-20 LAB — ETHANOL: Alcohol, Ethyl (B): <10 mg/dL (ref <10)

## 2018-03-20 MED ORDER — CLONIDINE HCL 0.1 MG PO TABS
0.3000 mg | ORAL_TABLET | Freq: Two times a day (BID) | ORAL | Status: DC
  Administered 2018-03-20: 0.3 mg via ORAL
  Filled 2018-03-20: qty 3

## 2018-03-20 MED ORDER — HYDRALAZINE HCL 25 MG PO TABS
25.0000 mg | ORAL_TABLET | Freq: Three times a day (TID) | ORAL | Status: DC
  Administered 2018-03-20 (×2): 25 mg via ORAL
  Filled 2018-03-20 (×2): qty 1

## 2018-03-20 MED ORDER — FUROSEMIDE 40 MG PO TABS
20.0000 mg | ORAL_TABLET | Freq: Every day | ORAL | Status: DC
  Administered 2018-03-20: 20 mg via ORAL
  Filled 2018-03-20: qty 1

## 2018-03-20 MED ORDER — CARBIDOPA-LEVODOPA ER 25-100 MG PO TBCR
1.0000 | EXTENDED_RELEASE_TABLET | Freq: Three times a day (TID) | ORAL | Status: DC
  Administered 2018-03-20 (×2): 1 via ORAL
  Filled 2018-03-20 (×2): qty 1

## 2018-03-20 MED ORDER — LORAZEPAM 1 MG PO TABS
1.0000 mg | ORAL_TABLET | Freq: Once | ORAL | Status: AC
  Administered 2018-03-20: 1 mg via ORAL
  Filled 2018-03-20: qty 1

## 2018-03-20 MED ORDER — TRAZODONE HCL 50 MG PO TABS: 25.0000 mg | ORAL_TABLET | Freq: Every day | ORAL | Status: DC

## 2018-03-20 MED ORDER — CARVEDILOL 6.25 MG PO TABS
6.2500 mg | ORAL_TABLET | Freq: Two times a day (BID) | ORAL | Status: DC
  Administered 2018-03-20: 6.25 mg via ORAL
  Filled 2018-03-20: qty 1

## 2018-03-20 MED ORDER — ACETAMINOPHEN 500 MG PO TABS
1000.0000 mg | ORAL_TABLET | Freq: Three times a day (TID) | ORAL | Status: DC | PRN
  Administered 2018-03-20: 1000 mg via ORAL
  Filled 2018-03-20: qty 2

## 2018-03-20 MED ORDER — CIPROFLOXACIN HCL 500 MG PO TABS
500.0000 mg | ORAL_TABLET | Freq: Two times a day (BID) | ORAL | Status: DC
  Administered 2018-03-20: 500 mg via ORAL
  Filled 2018-03-20: qty 1

## 2018-03-20 MED ORDER — CITALOPRAM HYDROBROMIDE 10 MG PO TABS: 20.0000 mg | ORAL_TABLET | Freq: Every day | ORAL | Status: DC

## 2018-03-20 MED ORDER — POTASSIUM CHLORIDE ER 10 MEQ PO TBCR
10.0000 meq | EXTENDED_RELEASE_TABLET | Freq: Every day | ORAL | Status: DC
  Administered 2018-03-20: 10 meq via ORAL
  Filled 2018-03-20: qty 1

## 2018-03-20 MED ORDER — TAMSULOSIN HCL 0.4 MG PO CAPS
0.4000 mg | ORAL_CAPSULE | Freq: Every day | ORAL | Status: DC
  Filled 2018-03-20: qty 1

## 2018-03-20 MED ORDER — PANTOPRAZOLE SODIUM 40 MG PO TBEC: 40.0000 mg | DELAYED_RELEASE_TABLET | ORAL | Status: DC

## 2018-03-20 MED ORDER — IRBESARTAN 300 MG PO TABS
300.0000 mg | ORAL_TABLET | Freq: Every day | ORAL | Status: DC
  Administered 2018-03-20: 300 mg via ORAL
  Filled 2018-03-20: qty 1

## 2018-03-20 MED ORDER — LORAZEPAM 1 MG PO TABS: 1.0000 mg | ORAL_TABLET | Freq: Two times a day (BID) | ORAL | 0 refills | Status: DC | PRN

## 2018-03-20 MED ORDER — CIPROFLOXACIN HCL 500 MG PO TABS: 500.0000 mg | ORAL_TABLET | Freq: Two times a day (BID) | ORAL | 0 refills | Status: DC

## 2018-03-20 MED ORDER — LORAZEPAM 1 MG PO TABS: 1.0000 mg | ORAL_TABLET | ORAL | Status: DC | PRN

## 2018-03-20 NOTE — ED Provider Notes (Signed)
[facility] DEPT Provider Note   CSN: [id number] Arrival date & time: 03/20/18  1419     History   Chief Complaint Chief Complaint  Patient presents with  . Urinary Frequency    HPI EDAHI KROENING is a 83 y.o. male history of BPH, CAD, CHF, here presenting with altered mental status, agitation.  Patient has Parkinson's and has been progressively more agitated over the last several months.  Patient was seen in the ED about a month ago and was diagnosed with UTI.  Patient was told he had recurrent UTIs and just finished a course of Keflex and now is on prophylaxis with low-dose Keflex of 250 mg daily.  Patient urinates frequently but denies any pain with urination.  Since last night around 11 PM, patient has been more agitated and refused to sleep.  Patient also refused to eat as well.  Patient has been very verbally aggressive with family.  Patient denies any fall or trauma.    The history is provided by the patient.    Past Medical History:  Diagnosis Date  . Arthritis   . BPH (benign prostatic hypertrophy) with urinary retention   . Chronic diastolic CHF (congestive heart failure) ([place])   . Colon polyp   . Coronary atherosclerosis of native coronary artery    a. s/p CABG 11/2003. b. s/p DES to radial-OM and SVG-RCA 11/05. c. s/p BMS to LCx 11/2008.  . Dilated aortic root ([place]) 05/29/2014   3mm by echo 04/2017  . Diverticulosis   . Dyslipidemia    statin intolerant  . Elevated fasting glucose   . Gastritis    a. by EGD 04/2004.  . H/O urinary retention    post surgical procedures; has foley cath  . Headache(784.0)   . Hiatal hernia   . HOH (hard of hearing)    slight hoh, rt>l  . HTN (hypertension)   . Hypertensive heart disease   . Insomnia   . Intracranial hemorrhage ([place])    a. small pontine [place] 2003.  [name] LBBB (left bundle branch block)   . Lumbar spondylolysis    multilevel DJD with mild spinal stenosis L3-L5  . Migraines   .  Parkinson's disease ([place])   . Peptic ulcer disease    a. prior hx of bleeding ulcers, including 12/2003 in setting of aspirin use.  . Renal insufficiency    renal duplex scan in 2012 showed no renal artery stenosis  . Sleep disturbance   . Stroke [facility])    no clinical manifestations - seen on neuroimaging  . Thrombocytopenia ([place])   . Tremor   . UTI (urinary tract infection)   . Wears glasses     Patient Active Problem List   Diagnosis Date Noted  . Aspiration pneumonia ([place]) 11/15/2017  . B12 deficiency 07/13/2016  . Orthostatic hypotension 03/26/2016  . Chronic diastolic heart failure ([place]) 12/02/2015  . CAD (coronary artery disease), native coronary artery 11/28/2014  . BPH (benign prostatic hypertrophy) with urinary retention 11/19/2014  . Diastolic dysfunction 50/53/9767  . UTI (urinary tract infection) 10/07/2014  . Abnormal EKG 10/04/2014  . Urinary retention 10/04/2014  . BPH (benign prostatic hypertrophy) 10/04/2014  . UTI (lower urinary tract infection) 10/03/2014  . Sepsis ([place]) 10/03/2014  . Parkinsonism ([place]) 06/12/2014  . Dilated aortic root ([place]) 05/29/2014  . CKD (chronic kidney disease), stage III ([place]) 04/25/2014  . Gastritis   . Peptic ulcer disease   . Stroke ([place])   . Hx of  CABG 2005, PCI 2010   . Parkinson's disease ([place])   . Small vessel disease, cerebrovascular 10/30/2013  . Intracerebral hemorrhage ([place]) 10/30/2013  . Tremor 08/14/2013  . Benign essential HTN 04/13/2013  . Dyslipidemia 04/13/2013  . LBBB (left bundle branch block) 04/13/2013  . Preoperative clearance 04/13/2013  . DJD (degenerative joint disease) of lumbar spine 04/13/2013    Past Surgical History:  Procedure Laterality Date  . APPENDECTOMY  62   1963  . CATARACT EXTRACTION, BILATERAL    . CHOLECYSTECTOMY  1998  . COLON SURGERY  03/14/2008  . COLONOSCOPY  10/2001  . CORONARY ANGIOPLASTY  6/10,10/05   Dr. [name] cardiologist: last office visit 11/07/14  . CORONARY  ARTERY BYPASS GRAFT  2005  . CYSTOSCOPY WITH LITHOLAPAXY N/A 11/19/2014   Procedure: CYSTOSCOPY WITH LITHOLAPAXY;  Surgeon: [name], MD;  Location: [facility];  Service: Urology;  Laterality: N/A;  . ESOPHAGOGASTRODUODENOSCOPY     10/2001, 12/2003, 04/2004  . LUMBAR LAMINECTOMY/DECOMPRESSION MICRODISCECTOMY N/A 05/03/2013   Procedure: LUMBAR LAMINECTOMY/DCOMPRESSION MICRODISCECTOMY RIGHT LUMBAR FOUR FIVE;  Surgeon: [name], MD;  Location: [place] [facility];  Service: Neurosurgery;  Laterality: N/A;  . SHOULDER ARTHROSCOPY Left 9/10  . stones     hx  . TRANSURETHRAL RESECTION OF PROSTATE N/A 11/19/2014   Procedure: TRANSURETHRAL RESECTION OF THE PROSTATE WITH GYRUS INSTRUMENTS;  Surgeon: [name], MD;  Location: [facility];  Service: Urology;  Laterality: N/A;  . WISDOM TOOTH EXTRACTION          Home Medications    Prior to Admission medications   Medication Sig Start Date End Date Taking? Authorizing Provider  acetaminophen (TYLENOL) 500 MG tablet Take 1,000 mg by mouth every 8 (eight) hours as needed for mild pain or moderate pain.   Yes [provider]  aspirin EC 81 MG tablet Take 81 mg by mouth. Patient takes 3 times a week   Yes [provider]  Carbidopa-Levodopa ER (SINEMET CR) 25-100 MG tablet controlled release Take 1 tablet by mouth 3 (three) times daily. 12/17/17  Yes Tat, Eustace Quail, DO  carvedilol (COREG) 6.25 MG tablet Take 1 tablet (6.25 mg total) by mouth 2 (two) times daily. 08/31/17  Yes Turner, Eber Hong, MD  cephALEXin (KEFLEX) 250 MG capsule Take 250 mg by mouth at bedtime.  03/17/18  Yes [provider]  citalopram (CELEXA) 20 MG tablet Take 20 mg by mouth at bedtime.    Yes [provider]  cloNIDine (CATAPRES) 0.3 MG tablet Take 0.3 mg by mouth 2 (two) times daily.    Yes [provider]  furosemide (LASIX) 20 MG tablet Take 1 tablet (20 mg total) by mouth daily. 08/31/17 08/26/18 Yes Turner,  Eber Hong, MD  hydrALAZINE (APRESOLINE) 25 MG tablet Take 1 tablet (25 mg total) by mouth 3 (three) times daily. 08/31/17 08/26/18 Yes Turner, Eber Hong, MD  irbesartan (AVAPRO) 300 MG tablet Take 300 mg by mouth at bedtime.    Yes [provider]  KLOR-CON 10 10 MEQ tablet Take 1 tablet (10 mEq total) by mouth daily. 10/27/17 10/22/18 Yes Turner, Eber Hong, MD  Maltodextrin-Xanthan Gum ([place]) POWD As needed with meals. 11/17/17  Yes Hosie Poisson, MD  nitroGLYCERIN (NITROSTAT) 0.4 MG SL tablet Place 1 tablet (0.4 mg total) under the tongue every 5 (five) minutes as needed for chest pain. 08/31/17  Yes Turner, Eber Hong, MD  pantoprazole (PROTONIX) 40 MG tablet Take 40 mg by mouth  3 (three) times a week.    Yes [provider]  tamsulosin (FLOMAX) 0.4 MG CAPS capsule Take 0.4 mg by mouth daily. 02/01/18  Yes [provider]  traMADol (ULTRAM) 50 MG tablet Take 50 mg by mouth daily as needed (pain).    Yes [provider]  traZODone (DESYREL) 50 MG tablet TAKE 1 TABLET BY MOUTH EVERYDAY AT BEDTIME Patient taking differently: Take 25 mg by mouth at bedtime.  02/14/18  Yes Tat, Eustace Quail, DO    Family History Family History  Problem Relation Age of Onset  . CAD Father 32       ASCAD  . CAD Sister   . Diabetes Child     Social History Social History   Tobacco Use  . Smoking status: Former Smoker    Packs/day: 2.00    Years: 5.00    Pack years: 10.00    Types: Cigarettes    Last attempt to quit: 04/28/1959    Years since quitting: 58.9  . Smokeless tobacco: Never Used  . Tobacco comment: 55+ yrs ago  Substance Use Topics  . Alcohol use: No  . Drug use: No     Allergies   Ambien [zolpidem tartrate]; Aspirin; Celebrex [celecoxib]; Nsaids; Toprol xl [metoprolol tartrate]; Altace [ramipril]; Amlodipine; Levothyroxine; Biaxin [clarithromycin]; and Tetracyclines & related   Review of Systems Review of Systems  Psychiatric/Behavioral: Positive  for agitation and behavioral problems.  All other systems reviewed and are negative.    Physical Exam Updated Vital Signs BP (!) 165/92   Pulse 70   Temp 99 F (37.2 C) (Oral)   Resp 16   SpO2 96%   Physical Exam  Constitutional:  Agitated   HENT:  Head: Normocephalic.  Mouth/Throat: Oropharynx is clear and moist.  Eyes: Pupils are equal, round, and reactive to light. Conjunctivae and EOM are normal.  Neck: Normal range of motion. Neck supple.  Cardiovascular: Normal rate, regular rhythm and normal heart sounds.  Pulmonary/Chest: Effort normal and breath sounds normal. No stridor. No respiratory distress. He has no wheezes.  Abdominal: Soft. Bowel sounds are normal. He exhibits no distension. There is no tenderness. There is no guarding.  Musculoskeletal: Normal range of motion.  Neurological: He is alert.  Agitated, CN 2- 12 intact. A & O x 2. Nl strength throughout   Skin: Skin is warm.  Psychiatric:  Agitated, poor judgment   Nursing note and vitals reviewed.    ED Treatments / Results  Labs (all labs ordered are listed, but only abnormal results are displayed) Labs Reviewed  URINALYSIS, ROUTINE W REFLEX MICROSCOPIC - Abnormal; Notable for the following components:      Result Value   Leukocytes, UA LARGE (*)    Bacteria, UA RARE (*)    All other components within normal limits  CBC WITH DIFFERENTIAL/PLATELET - Abnormal; Notable for the following components:   MCHC 36.5 (*)    Platelets 125 (*)    All other components within normal limits  COMPREHENSIVE METABOLIC PANEL - Abnormal; Notable for the following components:   Glucose, Bld 142 (*)    Creatinine, Ser 1.33 (*)    GFR calc non Af Amer 49 (*)    GFR calc Af Amer 57 (*)    All other components within normal limits  URINE CULTURE  ETHANOL  RAPID URINE DRUG SCREEN, HOSP PERFORMED    EKG None  Radiology Ct Head Wo Contrast  Result Date: 03/20/2018 CLINICAL DATA:  83 year old male with altered  mental status. EXAM: CT HEAD WITHOUT CONTRAST TECHNIQUE: Contiguous axial images were obtained from the base of the skull through the vertex without intravenous contrast. COMPARISON:  10/08/2017 CT and prior studies FINDINGS: Brain: No evidence of acute infarction, hemorrhage, hydrocephalus, extra-axial collection or mass lesion/mass effect. Mild atrophy and moderate to severe chronic small-vessel white matter ischemic changes again noted. Vascular: No hyperdense vessel or unexpected calcification. Skull: Normal. Negative for fracture or focal lesion. Sinuses/Orbits: No acute finding. Other: None. IMPRESSION: 1. No evidence of acute intracranial abnormality. 2. Mild atrophy and moderate to severe chronic small-vessel white matter ischemic changes. Electronically Signed   By: Margarette Canada M.D.   On: 03/20/2018 16:14    Procedures Procedures (including critical care time)  Medications Ordered in ED Medications  LORazepam (ATIVAN) tablet 1 mg (1 mg Oral Given 03/20/18 1609)     Initial Impression / Assessment and Plan / ED Course  I have reviewed the triage vital signs and the nursing notes.  Pertinent labs & imaging results that were available during my care of the patient were reviewed by me and considered in my medical decision making (see chart for details).    ELBER GALYEAN is a 83 y.o. male here with agitation, aggressive behavior. Likely worsening dementia with behavioral disturbance. Consider UTI or pneumonia as well but patient is on keflex already. Will get labs, UA, CXR, CT head. Likely need geri psych consult.   5:35 PM CT head unremarkable. Labs at baseline. UA + leuk and 6-10 WBC but only few bacteria. Previous urine culture showed no growth. He is on keflex already. I can switch him to cipro (500 mg BID x 7 days) but I think likely contamination. Urine culture sent. He doesn't meet inpatient admission criteria for UTI and I don't think this is what's causing his agitation. Started  on home meds. Consulted TTS for geripsych placement.   9:36 PM TTS states that he doesn't qualify for geripsych. I talked to wife. She is comfortable taking him home. Will start cipro 500 mg BID. Will add ativan prn agitation. Will add home health orders.    Final Clinical Impressions(s) / ED Diagnoses   Final diagnoses:  None    ED Discharge Orders    None       Drenda Freeze, MD 03/20/18 2158

## 2018-03-20 NOTE — ED Triage Notes (Signed)
Pt arrived from [facility]. Pt family states he has been more and more aggressive for the past couple days.  Pt has had 5 UTIs since April and every time he gets a UTI he gets aggressive. Has had increased urination as well.

## 2018-03-20 NOTE — BH Assessment (Addendum)
Assessment Note  Darryl [name] is an 83 y.o. male.  -Clinician reviewed note by Dr. Darl Householder.  Patient has Parkinson's and has been progressively more agitated over the last several months.  Patient was seen in the ED about a month ago and was diagnosed with UTI.  Patient was told he had recurrent UTIs and just finished a course of Keflex and now is on prophylaxis with low-dose Keflex of 250 mg daily.  Patient urinates frequently but denies any pain with urination.  Since last night around 11 PM, patient has been more agitated and refused to sleep.  Patient also refused to eat as well.  Patient has been very verbally aggressive with family.   Patient is accompanied by spouse throughout the assessment.  Patient reportedly has been obsessive about numbers and will talk to spouse repeatedly about the numbers on the remote for the television.  He is easily confused about the numbers and he thinks and talks about the number "9" and it's many permutations to spouse.  Today he was visited by grandchildren who played cards with him.  He became upset when the game was over because he did not understand that it was finished.  Spouse said that he will get irritated and not realize how he comes across.  Patient has not made any threats to harm anyone   He has made no threat to kill himself or anyone else.  Patient denies any A/V hallucinations.  Patient denies any outpatient mental health care history and no inpatient care history either.  Spouse said that she did not feel that he would hit her.  Patient said that he was happy at home.  -Clinician discussed patient care with Patriciaann Clan, PA.  He did not think that patient meets inpatient gero psych placement at this time.  Clinician talked with Dr. Darl Householder who said that social work may need to be involved.  He will talk with family more.  Diagnosis: Parkinsons  Past Medical History:  Past Medical History:  Diagnosis Date  . Arthritis   . BPH (benign prostatic  hypertrophy) with urinary retention   . Chronic diastolic CHF (congestive heart failure) ([place])   . Colon polyp   . Coronary atherosclerosis of native coronary artery    a. s/p CABG 11/2003. b. s/p DES to radial-OM and SVG-RCA 11/05. c. s/p BMS to LCx 11/2008.  . Dilated aortic root ([place]) 05/29/2014   62mm by echo 04/2017  . Diverticulosis   . Dyslipidemia    statin intolerant  . Elevated fasting glucose   . Gastritis    a. by EGD 04/2004.  . H/O urinary retention    post surgical procedures; has foley cath  . Headache(784.0)   . Hiatal hernia   . HOH (hard of hearing)    slight hoh, rt>l  . HTN (hypertension)   . Hypertensive heart disease   . Insomnia   . Intracranial hemorrhage ([place])    a. small pontine [place] 2003.  [name] LBBB (left bundle branch block)   . Lumbar spondylolysis    multilevel DJD with mild spinal stenosis L3-L5  . Migraines   . Parkinson's disease ([place])   . Peptic ulcer disease    a. prior hx of bleeding ulcers, including 12/2003 in setting of aspirin use.  . Renal insufficiency    renal duplex scan in 2012 showed no renal artery stenosis  . Sleep disturbance   . Stroke [facility])    no clinical manifestations - seen on neuroimaging  .  Thrombocytopenia ([place])   . Tremor   . UTI (urinary tract infection)   . Wears glasses     Past Surgical History:  Procedure Laterality Date  . APPENDECTOMY  62   1963  . CATARACT EXTRACTION, BILATERAL    . CHOLECYSTECTOMY  1998  . COLON SURGERY  03/14/2008  . COLONOSCOPY  10/2001  . CORONARY ANGIOPLASTY  6/10,10/05   Dr. [name] cardiologist: last office visit 11/07/14  . CORONARY ARTERY BYPASS GRAFT  2005  . CYSTOSCOPY WITH LITHOLAPAXY N/A 11/19/2014   Procedure: CYSTOSCOPY WITH LITHOLAPAXY;  Surgeon: [name], MD;  Location: [facility];  Service: Urology;  Laterality: N/A;  . ESOPHAGOGASTRODUODENOSCOPY     10/2001, 12/2003, 04/2004  . LUMBAR LAMINECTOMY/DECOMPRESSION MICRODISCECTOMY N/A 05/03/2013    Procedure: LUMBAR LAMINECTOMY/DCOMPRESSION MICRODISCECTOMY RIGHT LUMBAR FOUR FIVE;  Surgeon: [name], MD;  Location: [place] [facility];  Service: Neurosurgery;  Laterality: N/A;  . SHOULDER ARTHROSCOPY Left 9/10  . stones     hx  . TRANSURETHRAL RESECTION OF PROSTATE N/A 11/19/2014   Procedure: TRANSURETHRAL RESECTION OF THE PROSTATE WITH GYRUS INSTRUMENTS;  Surgeon: [name], MD;  Location: [facility];  Service: Urology;  Laterality: N/A;  . WISDOM TOOTH EXTRACTION      Family History:  Family History  Problem Relation Age of Onset  . CAD Father 42       ASCAD  . CAD Sister   . Diabetes Child     Social History:  reports that he quit smoking about 58 years ago. His smoking use included cigarettes. He has a 10.00 pack-year smoking history. He has never used smokeless tobacco. He reports that he does not drink alcohol or use drugs.  Additional Social History:  Alcohol / Drug Use Pain Medications: See PTA medication list Prescriptions: See PTA medication list Over the Counter: See PTA medication list History of alcohol / drug use?: No history of alcohol / drug abuse  CIWA: CIWA-Ar BP: (!) 162/99 Pulse Rate: 77 COWS:    Allergies:  Allergies  Allergen Reactions  . Ambien [Zolpidem Tartrate] Other (See Comments)    hallucinations  . Aspirin Other (See Comments)    ulcers  . Celebrex [Celecoxib] Other (See Comments)    Due to ulcer.  . Nsaids Other (See Comments)    Due to ulcer.  . Toprol Xl [Metoprolol Tartrate] Swelling  . Altace [Ramipril]     UNKNOWN  . Amlodipine     Feet swell  . Levothyroxine Other (See Comments)    "made him feel bad"  . Biaxin [Clarithromycin] Nausea Only  . Tetracyclines & Related Nausea Only    Home Medications:  (Not in a hospital admission)  OB/GYN Status:  No LMP for male patient.  General Assessment Data Location of Assessment: [facility] ED TTS Assessment: In system Is this a Tele or Face-to-Face Assessment?:  Face-to-Face Is this an Initial Assessment or a Re-assessment for this encounter?: Initial Assessment Patient Accompanied by:: Other(Wife [name]) Language Other than English: No Living Arrangements: Other (Comment)(Pt lives with his wife.) What gender do you identify as?: Male Marital status: Married Pregnancy Status: No Living Arrangements: Spouse/significant other Can pt return to current living arrangement?: Yes Admission Status: Voluntary Is patient capable of signing voluntary admission?: Yes Referral Source: Self/Family/Friend Insurance type: [place] Living Arrangements: Spouse/significant other Name of Psychiatrist: None Name of Therapist: None  Education Status Is patient currently in school?: No Is the  patient employed, unemployed or receiving disability?: (Pt retired.)  Risk to self with the past 6 months Suicidal Ideation: No Has patient been a risk to self within the past 6 months prior to admission? : No Suicidal Intent: No Has patient had any suicidal intent within the past 6 months prior to admission? : No Is patient at risk for suicide?: No Suicidal Plan?: No Has patient had any suicidal plan within the past 6 months prior to admission? : No Access to Means: No What has been your use of drugs/alcohol within the last 12 months?: N/A Previous Attempts/Gestures: No How many times?: 0 Other Self Harm Risks: None Triggers for Past Attempts: None known Intentional Self Injurious Behavior: None Family Suicide History: No Recent stressful life event(s): Recent negative physical changes(UTI) Persecutory voices/beliefs?: No Depression: No Depression Symptoms: (Pt denies any depressive symptoms.) Substance abuse history and/or treatment for substance abuse?: No Suicide prevention information given to non-admitted patients: Not applicable  Risk to Others within the past 6 months Homicidal Ideation: No Does patient have any lifetime risk  of violence toward others beyond the six months prior to admission? : No Thoughts of Harm to Others: No Current Homicidal Intent: No Current Homicidal Plan: No Access to Homicidal Means: No Identified Victim: No one History of harm to others?: No Assessment of Violence: None Noted Violent Behavior Description: Pt and wife deny Does patient have access to weapons?: No Criminal Charges Pending?: No Does patient have a court date: No Is patient on probation?: No  Psychosis Hallucinations: None noted Delusions: None noted  Mental Status Report Appearance/Hygiene: Unremarkable, In hospital gown Eye Contact: Good Motor Activity: Freedom of movement, Unsteady Speech: Logical/coherent Level of Consciousness: Alert Mood: Suspicious, Apprehensive Affect: Apprehensive Anxiety Level: None Thought Processes: Coherent, Relevant Judgement: Unimpaired Orientation: Person, Place Obsessive Compulsive Thoughts/Behaviors: Moderate  Cognitive Functioning Concentration: Decreased Memory: Remote Intact, Recent Impaired Is patient IDD: No Insight: Poor Impulse Control: Poor Appetite: Poor Have you had any weight changes? : Loss Amount of the weight change? (lbs): (20 lbs in last 9 months.) Sleep: Decreased Total Hours of Sleep: (Up and down a lot.) Vegetative Symptoms: None  ADLScreening [facility] Assessment Services) Patient's cognitive ability adequate to safely complete daily activities?: Yes Patient able to express need for assistance with ADLs?: Yes Independently performs ADLs?: Yes (appropriate for developmental age)  Prior Inpatient Therapy Prior Inpatient Therapy: No  Prior Outpatient Therapy Prior Outpatient Therapy: No Does patient have an ACCT team?: No Does patient have Intensive In-House Services?  : No Does patient have [facility] services? : No Does patient have P4CC services?: No  ADL Screening (condition at time of admission) Patient's cognitive ability adequate to  safely complete daily activities?: Yes Is the patient deaf or have difficulty hearing?: Yes("just a touch of hearing difficulty") Does the patient have difficulty seeing, even when wearing glasses/contacts?: No Does the patient have difficulty concentrating, remembering, or making decisions?: Yes Patient able to express need for assistance with ADLs?: Yes Does the patient have difficulty dressing or bathing?: No Independently performs ADLs?: Yes (appropriate for developmental age) Does the patient have difficulty walking or climbing stairs?: Yes(Uses a walker.) Weakness of Legs: Both Weakness of Arms/Hands: None       Abuse/Neglect Assessment (Assessment to be complete while patient is alone) Abuse/Neglect Assessment Can Be Completed: Yes Physical Abuse: Denies Verbal Abuse: Denies Sexual Abuse: Denies Exploitation of patient/patient's resources: Denies Self-Neglect: Denies     Regulatory affairs officer (For Healthcare) Does Patient Have a Medical Advance  Directive?: Yes Does patient want to make changes to medical advance directive?: No - Patient declined Type of Advance Directive: Healthcare Power of Attorney, Living will Conception in Chart?: No - copy requested Copy of Living Will in Chart?: No - copy requested          Disposition:  Disposition Initial Assessment Completed for this Encounter: Yes Patient referred to: Other (Comment)(To be reviewed with PA)  On Site Evaluation by:   Reviewed with Physician:    Curlene Dolphin Ray 03/20/2018 8:00 PM

## 2018-03-20 NOTE — ED Notes (Signed)
Bed: XO60 Expected date:  Expected time:  Means of arrival:  Comments: 83 yo AMS

## 2018-03-20 NOTE — Discharge Instructions (Signed)
Switch keflex to cipro 500 mg twice daily for a week.   Take ativan as needed for agitation.   Follow up with your neurologist as scheduled   Home health will contact you regarding services   See your doctor  Return to ER if you have worse agitation, lethargy.

## 2018-03-21 ENCOUNTER — Other Ambulatory Visit: Payer: Medicare Other

## 2018-03-21 NOTE — Care Management Note (Signed)
Case Management Note  Patient Details  Name: Darryl [name] MRN: [id number] Date of Birth: [birth year]  CM consulted for [facility]  CM spoke with pt's spouse who advised they would like to use [facility] again.  She also would like to start planning for facility placement and has questions about how to pay for it.  CM advised the [facility] SW would be able to do that.  CM contacted Dian Situ with [facility] who accepted pt for services and is aware of pt's needs.  Updated Dr. Darl Householder via messages.  No further CM needs noted at this time.  Expected Discharge Date:   03/20/2018               Expected Discharge Plan:  [place]  Discharge planning Services  CM Consult  Post Acute Care Choice:  Home Health Choice offered to:  Spouse  HH Arranged:  RN, PT, OT, Nurse's Aide [place] Agency:  [facility]  Status of Service:  Completed, signed off  Rae Mar, RN 03/21/2018, 9:28 AM

## 2018-03-23 ENCOUNTER — Ambulatory Visit (INDEPENDENT_AMBULATORY_CARE_PROVIDER_SITE_OTHER): Payer: Medicare Other | Admitting: Cardiology

## 2018-03-23 ENCOUNTER — Encounter: Payer: Self-pay | Admitting: Cardiology

## 2018-03-23 VITALS — BP 118/68 | HR 67 | Ht 69.0 in | Wt 179.8 lb

## 2018-03-23 DIAGNOSIS — I5032 Chronic diastolic (congestive) heart failure: Secondary | ICD-10-CM | POA: Diagnosis not present

## 2018-03-23 DIAGNOSIS — I77810 Thoracic aortic ectasia: Secondary | ICD-10-CM | POA: Diagnosis not present

## 2018-03-23 DIAGNOSIS — I2510 Atherosclerotic heart disease of native coronary artery without angina pectoris: Secondary | ICD-10-CM | POA: Diagnosis not present

## 2018-03-23 DIAGNOSIS — I110 Hypertensive heart disease with heart failure: Secondary | ICD-10-CM

## 2018-03-23 DIAGNOSIS — I10 Essential (primary) hypertension: Secondary | ICD-10-CM | POA: Diagnosis not present

## 2018-03-23 DIAGNOSIS — I951 Orthostatic hypotension: Secondary | ICD-10-CM

## 2018-03-23 DIAGNOSIS — E785 Hyperlipidemia, unspecified: Secondary | ICD-10-CM | POA: Diagnosis not present

## 2018-03-23 LAB — URINE CULTURE: Culture: 30000 — AB

## 2018-03-23 MED ORDER — CLONIDINE HCL 0.2 MG PO TABS: 0.3000 mg | ORAL_TABLET | Freq: Two times a day (BID) | ORAL | 3 refills | Status: DC

## 2018-03-23 MED ORDER — KLOR-CON 10 10 MEQ PO TBCR: 10.0000 meq | EXTENDED_RELEASE_TABLET | Freq: Every day | ORAL | 3 refills | Status: DC

## 2018-03-23 MED ORDER — CARVEDILOL 6.25 MG PO TABS: 6.2500 mg | ORAL_TABLET | Freq: Two times a day (BID) | ORAL | 3 refills | Status: DC

## 2018-03-23 MED ORDER — FUROSEMIDE 20 MG PO TABS: 20.0000 mg | ORAL_TABLET | Freq: Every day | ORAL | 3 refills | Status: AC

## 2018-03-23 NOTE — Patient Instructions (Signed)
Medication Instructions:  Your physician recommends that you continue on your current medications as directed. Please refer to the Current Medication list given to you today.  Follow-Up: Your physician wants you to follow-up in: 6 months with Dr. Radford Pax. You will receive a reminder letter in the mail two months in advance. If you don't receive a letter, please call our office to schedule the follow-up appointment.  If you need a refill on your cardiac medications before your next appointment, please call your pharmacy.

## 2018-03-23 NOTE — Progress Notes (Signed)
Cardiology Office Note:    Date:  03/23/2018   ID:  Darryl [name], DOB [birth year], MRN [id number]  PCP:  [name], MD  Cardiologist:  Darryl Him, MD    Referring MD: [name], MD   Chief Complaint  Patient presents with  . Coronary Artery Disease  . Hypertension  . Congestive Heart Failure  . Hyperlipidemia    History of Present Illness:    Darryl [name] is a 83 y.o. male with a hx of CADs/p remote CABG 11/2003 and DES to radial-OM and SVG to RCA 04/2004, BMS to LCx in 2010. He also has a history ofHTN,chronic LBBB,dyslipidemia and chronic diastolic CHF.  His exercise is limited by his Parkinson's but tries to walk with a cane.  He is here today for followup and is doing well.  He denies any chest pain or pressure, SOB, DOE, PND, orthopnea, LE edema,  palpitations or syncope.  He still occasionally has some problems with dizziness when going from sitting to standing but has had no syncope.  He is compliant with his meds and is tolerating meds with no SE.    Past Medical History:  Diagnosis Date  . Arthritis   . BPH (benign prostatic hypertrophy) with urinary retention   . Chronic diastolic CHF (congestive heart failure) ([place])   . Colon polyp   . Coronary atherosclerosis of native coronary artery    a. s/p CABG 11/2003. b. s/p DES to radial-OM and SVG-RCA 11/05. c. s/p BMS to LCx 11/2008.  . Dilated aortic root ([place]) 05/29/2014   89mm by echo 04/2017  . Diverticulosis   . Dyslipidemia    statin intolerant  . Elevated fasting glucose   . Gastritis    a. by EGD 04/2004.  . H/O urinary retention    post surgical procedures; has foley cath  . Headache(784.0)   . Hiatal hernia   . HOH (hard of hearing)    slight hoh, rt>l  . HTN (hypertension)   . Hypertensive heart disease   . Insomnia   . Intracranial hemorrhage ([place])    a. small pontine Kincaid 2003.  [name] LBBB (left bundle branch block)   . Lumbar spondylolysis    multilevel DJD with mild spinal  stenosis L3-L5  . Migraines   . Parkinson's disease ([place])   . Peptic ulcer disease    a. prior hx of bleeding ulcers, including 12/2003 in setting of aspirin use.  . Renal insufficiency    renal duplex scan in 2012 showed no renal artery stenosis  . Sleep disturbance   . Stroke [facility])    no clinical manifestations - seen on neuroimaging  . Thrombocytopenia ([place])   . Tremor   . UTI (urinary tract infection)   . Wears glasses     Past Surgical History:  Procedure Laterality Date  . APPENDECTOMY  62   1963  . CATARACT EXTRACTION, BILATERAL    . CHOLECYSTECTOMY  1998  . COLON SURGERY  03/14/2008  . COLONOSCOPY  10/2001  . CORONARY ANGIOPLASTY  6/10,10/05   Dr. Fransico [name]- cardiologist: last office visit 11/07/14  . CORONARY ARTERY BYPASS GRAFT  2005  . CYSTOSCOPY WITH LITHOLAPAXY N/A 11/19/2014   Procedure: CYSTOSCOPY WITH LITHOLAPAXY;  Surgeon: [name], MD;  Location: [facility];  Service: Urology;  Laterality: N/A;  . ESOPHAGOGASTRODUODENOSCOPY     10/2001, 12/2003, 04/2004  . LUMBAR LAMINECTOMY/DECOMPRESSION MICRODISCECTOMY N/A 05/03/2013   Procedure: LUMBAR LAMINECTOMY/DCOMPRESSION MICRODISCECTOMY RIGHT LUMBAR FOUR FIVE;  Surgeon: Dominica Severin  Levy Sjogren, MD;  Location: [facility];  Service: Neurosurgery;  Laterality: N/A;  . SHOULDER ARTHROSCOPY Left 9/10  . stones     hx  . TRANSURETHRAL RESECTION OF PROSTATE N/A 11/19/2014   Procedure: TRANSURETHRAL RESECTION OF THE PROSTATE WITH GYRUS INSTRUMENTS;  Surgeon: [name], MD;  Location: [facility];  Service: Urology;  Laterality: N/A;  . WISDOM TOOTH EXTRACTION      Current Medications: Current Meds  Medication Sig  . acetaminophen (TYLENOL) 500 MG tablet Take 1,000 mg by mouth every 8 (eight) hours as needed for mild pain or moderate pain.  [name] aspirin EC 81 MG tablet Take 81 mg by mouth. Patient takes 3 times a week  . Carbidopa-Levodopa ER (SINEMET CR) 25-100 MG tablet controlled release Take 1  tablet by mouth 3 (three) times daily.  . carvedilol (COREG) 6.25 MG tablet Take 1 tablet (6.25 mg total) by mouth 2 (two) times daily.  . cephALEXin (KEFLEX) 250 MG capsule Take 250 mg by mouth at bedtime.   . ciprofloxacin (CIPRO) 500 MG tablet Take 1 tablet (500 mg total) by mouth 2 (two) times daily. One po bid x 7 days  . citalopram (CELEXA) 20 MG tablet Take 20 mg by mouth at bedtime.   . cloNIDine (CATAPRES) 0.3 MG tablet Take 0.3 mg by mouth 2 (two) times daily.   . furosemide (LASIX) 20 MG tablet Take 1 tablet (20 mg total) by mouth daily.  . hydrALAZINE (APRESOLINE) 25 MG tablet Take 1 tablet (25 mg total) by mouth 3 (three) times daily.  . irbesartan (AVAPRO) 300 MG tablet Take 300 mg by mouth at bedtime.   [name] KLOR-CON 10 10 MEQ tablet Take 1 tablet (10 mEq total) by mouth daily.  [name] LORazepam (ATIVAN) 1 MG tablet Take 1 tablet (1 mg total) by mouth 2 (two) times daily as needed for anxiety.  . Maltodextrin-Xanthan Gum ([place]) POWD As needed with meals.  . nitroGLYCERIN (NITROSTAT) 0.4 MG SL tablet Place 1 tablet (0.4 mg total) under the tongue every 5 (five) minutes as needed for chest pain.  . pantoprazole (PROTONIX) 40 MG tablet Take 40 mg by mouth 3 (three) times a week.   . tamsulosin (FLOMAX) 0.4 MG CAPS capsule Take 0.4 mg by mouth daily.  . traMADol (ULTRAM) 50 MG tablet Take 50 mg by mouth daily as needed (pain).   . traZODone (DESYREL) 50 MG tablet TAKE 1 TABLET BY MOUTH EVERYDAY AT BEDTIME (Patient taking differently: Take 25 mg by mouth at bedtime. )     Allergies:   Ambien [zolpidem tartrate]; Aspirin; Celebrex [celecoxib]; Nsaids; Toprol xl [metoprolol tartrate]; Altace [ramipril]; Amlodipine; Levothyroxine; Biaxin [clarithromycin]; and Tetracyclines & related   Social History   Socioeconomic History  . Marital status: Married    Spouse name: [name]  . Number of children: 4  . Years of education: College  . Highest education level: Not on file    Occupational History  . Occupation: retired    Comment: Psychiatrist  Social Needs  . Financial resource strain: Not on file  . Food insecurity:    Worry: Not on file    Inability: Not on file  . Transportation needs:    Medical: Not on file    Non-medical: Not on file  Tobacco Use  . Smoking status: Former Smoker    Packs/day: 2.00    Years: 5.00    Pack years: 10.00    Types: Cigarettes  Last attempt to quit: 04/28/1959    Years since quitting: 58.9  . Smokeless tobacco: Never Used  . Tobacco comment: 55+ yrs ago  Substance and Sexual Activity  . Alcohol use: No  . Drug use: No  . Sexual activity: Not on file  Lifestyle  . Physical activity:    Days per week: Not on file    Minutes per session: Not on file  . Stress: Not on file  Relationships  . Social connections:    Talks on phone: Not on file    Gets together: Not on file    Attends religious service: Not on file    Active member of club or organization: Not on file    Attends meetings of clubs or organizations: Not on file    Relationship status: Not on file  Other Topics Concern  . Not on file  Social History Narrative   Patient is married to [name]), has 4 children   Patient is right handed   Education level is college   Caffeine consumption is none     Family History: The patient's family history includes CAD in his sister; CAD (age of onset: 45) in his father; Diabetes in his child.  ROS:   Please see the history of present illness.    ROS  All other systems reviewed and negative.   EKGs/Labs/Other Studies Reviewed:    The following studies were reviewed today: none  EKG:  EKG is not ordered today.   Recent Labs: 11/15/2017: B Natriuretic Peptide 194.5 03/20/2018: ALT 5; BUN 19; Creatinine, Ser 1.33; Hemoglobin 14.7; Platelets 125; Potassium 4.2; Sodium 143   Recent Lipid Panel    Component Value Date/Time   CHOL 131 05/10/2017 1011   TRIG 252 (H) 05/10/2017 1011   HDL 31 (L)  05/10/2017 1011   CHOLHDL 4.2 05/10/2017 1011   CHOLHDL 4.8 06/04/2016 0802   VLDL 69 (H) 06/04/2016 0802   LDLCALC 50 05/10/2017 1011   LDLDIRECT 42.0 12/06/2014 0757    Physical Exam:    VS:  BP 118/68   Pulse 67   Ht 5\' 9"  (1.753 m)   Wt 179 lb 12.8 oz (81.6 kg)   SpO2 97%   BMI 26.55 kg/m     Wt Readings from Last 3 Encounters:  03/23/18 179 lb 12.8 oz (81.6 kg)  11/29/17 180 lb (81.6 kg)  11/17/17 176 lb 12.9 oz (80.2 kg)     GEN:  Well nourished, well developed in no acute distress HEENT: Normal NECK: No JVD; No carotid bruits LYMPHATICS: No lymphadenopathy CARDIAC: RRR, no murmurs, rubs, gallops RESPIRATORY:  Clear to auscultation without rales, wheezing or rhonchi  ABDOMEN: Soft, non-tender, non-distended MUSCULOSKELETAL:  No edema; No deformity  SKIN: Warm and dry NEUROLOGIC:  Alert and oriented x 3 PSYCHIATRIC:  Normal affect   ASSESSMENT:    1. Coronary artery disease involving native coronary artery of native heart without angina pectoris   2. Benign essential HTN   3. Chronic diastolic heart failure ([place])   4. Dilated aortic root ([place])   5. Orthostatic hypotension   6. Dyslipidemia    PLAN:    In order of problems listed above:  1.  ASCAD - s/p remote CABG 11/2003 and DES to radial-OM and SVG to RCA 04/2004, BMS to LCx in 2010.  He has not had any anginal symptoms.  Will continue on aspirin 81 mg daily, beta-blocker.  Is statin intolerant.  2.  HTN - BP is well controlled on  exam today.  He will continue on clonidine 0.3 mg twice daily, hydralazine 25 mg 3 times daily, irbesartan 300 mg daily, carvedilol 6.25 mg twice daily.  His creatinine was stable at 1.33 on 03/20/2018.  3.  Chronic diastolic CHF -appears euvolemic on exam today.  Weight is stable.  He will continue on Lasix 20 mg daily.  Potassium was 4.2 on 03/20/2018.  4.  Dilated aortic root -aortic root and ascending aorta measured 40 mm on echo a year ago.  I will repeat echo in  November.  5.  Orthostatic hypotension -related to autonomic dysfunction from his Parkinson's disease.  He has not had any significant problems with  syncope recently still has some occasional dizziness when going from sitting to standing.  I recommended that we add compression hose that he wears daily.  I will give [name] a prescription today.  6.  Dyslipidemia -goal is less than 70.  His LDL was 34 on 03/09/2018 with normal ALT at 5.  He is statin intolerant.   Medication Adjustments/Labs and Tests Ordered: Current medicines are reviewed at length with the patient today.  Concerns regarding medicines are outlined above.  No orders of the defined types were placed in this encounter.  No orders of the defined types were placed in this encounter.   Signed, Darryl Him, MD  03/23/2018 9:22 AM    [facility]

## 2018-03-24 ENCOUNTER — Telehealth: Payer: Self-pay | Admitting: *Deleted

## 2018-03-24 NOTE — Progress Notes (Signed)
ED Antimicrobial Stewardship Positive Culture Follow Up   Darryl [name] is an 83 y.o. male who presented to [facility] on 03/20/2018 with a chief complaint of  Chief Complaint  Patient presents with  . Urinary Frequency    Recent Results (from the past 720 hour(s))  Urine culture     Status: Abnormal   Collection Time: 03/20/18  2:55 PM  Result Value Ref Range Status   Specimen Description   Final    URINE, CLEAN CATCH Performed at [facility], [place] [address] [place], [place] [address]    Special Requests   Final    NONE Performed at [facility], [place] [address] [place], [place] [address]    Culture (A)  Final    30,000 COLONIES/mL ESCHERICHIA COLI 30,000 COLONIES/mL ENTEROCOCCUS FAECALIS    Report Status 03/23/2018 FINAL  Final   Organism ID, Bacteria ESCHERICHIA COLI (A)  Final   Organism ID, Bacteria ENTEROCOCCUS FAECALIS (A)  Final      Susceptibility   Escherichia coli - MIC*    AMPICILLIN >=32 RESISTANT Resistant     CEFAZOLIN 8 SENSITIVE Sensitive     CEFTRIAXONE <=1 SENSITIVE Sensitive     CIPROFLOXACIN <=0.25 SENSITIVE Sensitive     GENTAMICIN >=16 RESISTANT Resistant     IMIPENEM <=0.25 SENSITIVE Sensitive     NITROFURANTOIN <=16 SENSITIVE Sensitive     TRIMETH/SULFA >=320 RESISTANT Resistant     AMPICILLIN/SULBACTAM >=32 RESISTANT Resistant     PIP/TAZO <=4 SENSITIVE Sensitive     Extended ESBL NEGATIVE Sensitive     * 30,000 COLONIES/mL ESCHERICHIA COLI   Enterococcus faecalis - MIC*    AMPICILLIN <=2 SENSITIVE Sensitive     LEVOFLOXACIN >=8 RESISTANT Resistant     NITROFURANTOIN <=16 SENSITIVE Sensitive     VANCOMYCIN 1 SENSITIVE Sensitive     * 30,000 COLONIES/mL ENTEROCOCCUS FAECALIS    [x]  Treated with ciprofloxacin, one organism is resistant to prescribed antimicrobial []  Patient discharged originally without antimicrobial agent and treatment is now indicated  New antibiotic prescription: If pt is  symptomatic, add amoxicillin 500mg  PO BID x 7 days  ED Provider: Dalia Heading, PA   [name], [name] 03/24/2018, 10:03 AM Clinical Pharmacist Monday - Friday phone -  [phone] Saturday - Sunday phone - [phone]

## 2018-03-24 NOTE — Telephone Encounter (Signed)
Post ED Visit - Positive Culture Follow-up  Culture report reviewed by antimicrobial stewardship pharmacist:  []  Elenor Quinones, Pharm.D. []  Heide Guile, Pharm.D., BCPS AQ-ID []  Parks Neptune, Pharm.D., BCPS []  Alycia Rossetti, Pharm.D., BCPS []  [place], Pharm.D., BCPS, AAHIVP []  Legrand Como, Pharm.D., BCPS, AAHIVP []  Salome Arnt, PharmD, BCPS []  Johnnette Gourd, PharmD, BCPS []  Hughes Better, PharmD, BCPS []  Leeroy Cha, PharmD  Positive urine culture Treated with Cephalexin and Ciprofloxacin HCL.  Symptom check with wife of patient states no current symptoms and feeling better  Ardeen Fillers 03/24/2018, 10:36 AM

## 2018-03-25 DIAGNOSIS — Z7982 Long term (current) use of aspirin: Secondary | ICD-10-CM | POA: Diagnosis not present

## 2018-03-25 DIAGNOSIS — R338 Other retention of urine: Secondary | ICD-10-CM | POA: Diagnosis not present

## 2018-03-25 DIAGNOSIS — Z792 Long term (current) use of antibiotics: Secondary | ICD-10-CM | POA: Diagnosis not present

## 2018-03-25 DIAGNOSIS — N401 Enlarged prostate with lower urinary tract symptoms: Secondary | ICD-10-CM | POA: Diagnosis not present

## 2018-03-25 DIAGNOSIS — I5032 Chronic diastolic (congestive) heart failure: Secondary | ICD-10-CM | POA: Diagnosis not present

## 2018-03-25 DIAGNOSIS — F0280 Dementia in other diseases classified elsewhere without behavioral disturbance: Secondary | ICD-10-CM | POA: Diagnosis not present

## 2018-03-25 DIAGNOSIS — N183 Chronic kidney disease, stage 3 (moderate): Secondary | ICD-10-CM | POA: Diagnosis not present

## 2018-03-25 DIAGNOSIS — I130 Hypertensive heart and chronic kidney disease with heart failure and stage 1 through stage 4 chronic kidney disease, or unspecified chronic kidney disease: Secondary | ICD-10-CM | POA: Diagnosis not present

## 2018-03-25 DIAGNOSIS — R1312 Dysphagia, oropharyngeal phase: Secondary | ICD-10-CM | POA: Diagnosis not present

## 2018-03-25 DIAGNOSIS — Z87891 Personal history of nicotine dependence: Secondary | ICD-10-CM | POA: Diagnosis not present

## 2018-03-25 DIAGNOSIS — G20 Parkinson's disease: Secondary | ICD-10-CM | POA: Diagnosis not present

## 2018-03-25 DIAGNOSIS — Z87440 Personal history of urinary (tract) infections: Secondary | ICD-10-CM | POA: Diagnosis not present

## 2018-03-25 DIAGNOSIS — Z951 Presence of aortocoronary bypass graft: Secondary | ICD-10-CM | POA: Diagnosis not present

## 2018-03-25 DIAGNOSIS — Z9181 History of falling: Secondary | ICD-10-CM | POA: Diagnosis not present

## 2018-03-25 DIAGNOSIS — M5136 Other intervertebral disc degeneration, lumbar region: Secondary | ICD-10-CM | POA: Diagnosis not present

## 2018-03-25 DIAGNOSIS — I2510 Atherosclerotic heart disease of native coronary artery without angina pectoris: Secondary | ICD-10-CM | POA: Diagnosis not present

## 2018-03-28 DIAGNOSIS — I5032 Chronic diastolic (congestive) heart failure: Secondary | ICD-10-CM | POA: Diagnosis not present

## 2018-03-28 DIAGNOSIS — R1312 Dysphagia, oropharyngeal phase: Secondary | ICD-10-CM | POA: Diagnosis not present

## 2018-03-28 DIAGNOSIS — I130 Hypertensive heart and chronic kidney disease with heart failure and stage 1 through stage 4 chronic kidney disease, or unspecified chronic kidney disease: Secondary | ICD-10-CM | POA: Diagnosis not present

## 2018-03-28 DIAGNOSIS — G20 Parkinson's disease: Secondary | ICD-10-CM | POA: Diagnosis not present

## 2018-03-28 DIAGNOSIS — F0280 Dementia in other diseases classified elsewhere without behavioral disturbance: Secondary | ICD-10-CM | POA: Diagnosis not present

## 2018-03-28 DIAGNOSIS — I2510 Atherosclerotic heart disease of native coronary artery without angina pectoris: Secondary | ICD-10-CM | POA: Diagnosis not present

## 2018-03-30 DIAGNOSIS — F0280 Dementia in other diseases classified elsewhere without behavioral disturbance: Secondary | ICD-10-CM | POA: Diagnosis not present

## 2018-03-30 DIAGNOSIS — R1312 Dysphagia, oropharyngeal phase: Secondary | ICD-10-CM | POA: Diagnosis not present

## 2018-03-30 DIAGNOSIS — I2510 Atherosclerotic heart disease of native coronary artery without angina pectoris: Secondary | ICD-10-CM | POA: Diagnosis not present

## 2018-03-30 DIAGNOSIS — I130 Hypertensive heart and chronic kidney disease with heart failure and stage 1 through stage 4 chronic kidney disease, or unspecified chronic kidney disease: Secondary | ICD-10-CM | POA: Diagnosis not present

## 2018-03-30 DIAGNOSIS — I5032 Chronic diastolic (congestive) heart failure: Secondary | ICD-10-CM | POA: Diagnosis not present

## 2018-03-30 DIAGNOSIS — G20 Parkinson's disease: Secondary | ICD-10-CM | POA: Diagnosis not present

## 2018-04-01 DIAGNOSIS — I2510 Atherosclerotic heart disease of native coronary artery without angina pectoris: Secondary | ICD-10-CM | POA: Diagnosis not present

## 2018-04-01 DIAGNOSIS — F0280 Dementia in other diseases classified elsewhere without behavioral disturbance: Secondary | ICD-10-CM | POA: Diagnosis not present

## 2018-04-01 DIAGNOSIS — R1312 Dysphagia, oropharyngeal phase: Secondary | ICD-10-CM | POA: Diagnosis not present

## 2018-04-01 DIAGNOSIS — I5032 Chronic diastolic (congestive) heart failure: Secondary | ICD-10-CM | POA: Diagnosis not present

## 2018-04-01 DIAGNOSIS — I130 Hypertensive heart and chronic kidney disease with heart failure and stage 1 through stage 4 chronic kidney disease, or unspecified chronic kidney disease: Secondary | ICD-10-CM | POA: Diagnosis not present

## 2018-04-01 DIAGNOSIS — G20 Parkinson's disease: Secondary | ICD-10-CM | POA: Diagnosis not present

## 2018-04-04 DIAGNOSIS — I130 Hypertensive heart and chronic kidney disease with heart failure and stage 1 through stage 4 chronic kidney disease, or unspecified chronic kidney disease: Secondary | ICD-10-CM | POA: Diagnosis not present

## 2018-04-04 DIAGNOSIS — R1312 Dysphagia, oropharyngeal phase: Secondary | ICD-10-CM | POA: Diagnosis not present

## 2018-04-04 DIAGNOSIS — I5032 Chronic diastolic (congestive) heart failure: Secondary | ICD-10-CM | POA: Diagnosis not present

## 2018-04-04 DIAGNOSIS — I2510 Atherosclerotic heart disease of native coronary artery without angina pectoris: Secondary | ICD-10-CM | POA: Diagnosis not present

## 2018-04-04 DIAGNOSIS — G20 Parkinson's disease: Secondary | ICD-10-CM | POA: Diagnosis not present

## 2018-04-04 DIAGNOSIS — F0280 Dementia in other diseases classified elsewhere without behavioral disturbance: Secondary | ICD-10-CM | POA: Diagnosis not present

## 2018-04-06 ENCOUNTER — Inpatient Hospital Stay (HOSPITAL_COMMUNITY)
Admission: EM | Admit: 2018-04-06 | Discharge: 2018-04-08 | DRG: 071 | Disposition: A | Discharge status: Hospice | Payer: Medicare Other | Attending: Internal Medicine | Admitting: Internal Medicine

## 2018-04-06 ENCOUNTER — Emergency Department (HOSPITAL_COMMUNITY): Payer: Medicare Other

## 2018-04-06 ENCOUNTER — Other Ambulatory Visit: Payer: Self-pay

## 2018-04-06 ENCOUNTER — Encounter (HOSPITAL_COMMUNITY): Payer: Self-pay

## 2018-04-06 DIAGNOSIS — M47816 Spondylosis without myelopathy or radiculopathy, lumbar region: Secondary | ICD-10-CM | POA: Diagnosis present

## 2018-04-06 DIAGNOSIS — F0280 Dementia in other diseases classified elsewhere without behavioral disturbance: Secondary | ICD-10-CM | POA: Diagnosis present

## 2018-04-06 DIAGNOSIS — Z973 Presence of spectacles and contact lenses: Secondary | ICD-10-CM

## 2018-04-06 DIAGNOSIS — Z86010 Personal history of colonic polyps: Secondary | ICD-10-CM

## 2018-04-06 DIAGNOSIS — I447 Left bundle-branch block, unspecified: Secondary | ICD-10-CM | POA: Diagnosis not present

## 2018-04-06 DIAGNOSIS — M5136 Other intervertebral disc degeneration, lumbar region: Secondary | ICD-10-CM | POA: Diagnosis present

## 2018-04-06 DIAGNOSIS — I130 Hypertensive heart and chronic kidney disease with heart failure and stage 1 through stage 4 chronic kidney disease, or unspecified chronic kidney disease: Secondary | ICD-10-CM | POA: Diagnosis present

## 2018-04-06 DIAGNOSIS — M48061 Spinal stenosis, lumbar region without neurogenic claudication: Secondary | ICD-10-CM | POA: Diagnosis present

## 2018-04-06 DIAGNOSIS — R1312 Dysphagia, oropharyngeal phase: Secondary | ICD-10-CM | POA: Diagnosis not present

## 2018-04-06 DIAGNOSIS — Z833 Family history of diabetes mellitus: Secondary | ICD-10-CM

## 2018-04-06 DIAGNOSIS — G319 Degenerative disease of nervous system, unspecified: Secondary | ICD-10-CM | POA: Diagnosis present

## 2018-04-06 DIAGNOSIS — G4700 Insomnia, unspecified: Secondary | ICD-10-CM | POA: Diagnosis present

## 2018-04-06 DIAGNOSIS — I5032 Chronic diastolic (congestive) heart failure: Secondary | ICD-10-CM | POA: Diagnosis present

## 2018-04-06 DIAGNOSIS — E785 Hyperlipidemia, unspecified: Secondary | ICD-10-CM

## 2018-04-06 DIAGNOSIS — E538 Deficiency of other specified B group vitamins: Secondary | ICD-10-CM | POA: Diagnosis present

## 2018-04-06 DIAGNOSIS — Z7982 Long term (current) use of aspirin: Secondary | ICD-10-CM

## 2018-04-06 DIAGNOSIS — R4182 Altered mental status, unspecified: Secondary | ICD-10-CM | POA: Diagnosis not present

## 2018-04-06 DIAGNOSIS — R079 Chest pain, unspecified: Secondary | ICD-10-CM | POA: Diagnosis present

## 2018-04-06 DIAGNOSIS — G9341 Metabolic encephalopathy: Secondary | ICD-10-CM | POA: Diagnosis not present

## 2018-04-06 DIAGNOSIS — Z9049 Acquired absence of other specified parts of digestive tract: Secondary | ICD-10-CM

## 2018-04-06 DIAGNOSIS — E860 Dehydration: Secondary | ICD-10-CM | POA: Diagnosis present

## 2018-04-06 DIAGNOSIS — G9340 Encephalopathy, unspecified: Secondary | ICD-10-CM | POA: Diagnosis not present

## 2018-04-06 DIAGNOSIS — Z9841 Cataract extraction status, right eye: Secondary | ICD-10-CM

## 2018-04-06 DIAGNOSIS — R2681 Unsteadiness on feet: Secondary | ICD-10-CM | POA: Diagnosis present

## 2018-04-06 DIAGNOSIS — Z66 Do not resuscitate: Secondary | ICD-10-CM | POA: Diagnosis not present

## 2018-04-06 DIAGNOSIS — Z888 Allergy status to other drugs, medicaments and biological substances status: Secondary | ICD-10-CM

## 2018-04-06 DIAGNOSIS — M79604 Pain in right leg: Secondary | ICD-10-CM | POA: Diagnosis present

## 2018-04-06 DIAGNOSIS — R338 Other retention of urine: Secondary | ICD-10-CM | POA: Diagnosis present

## 2018-04-06 DIAGNOSIS — Z951 Presence of aortocoronary bypass graft: Secondary | ICD-10-CM

## 2018-04-06 DIAGNOSIS — Z8711 Personal history of peptic ulcer disease: Secondary | ICD-10-CM

## 2018-04-06 DIAGNOSIS — M1990 Unspecified osteoarthritis, unspecified site: Secondary | ICD-10-CM | POA: Diagnosis present

## 2018-04-06 DIAGNOSIS — Z8673 Personal history of transient ischemic attack (TIA), and cerebral infarction without residual deficits: Secondary | ICD-10-CM

## 2018-04-06 DIAGNOSIS — N400 Enlarged prostate without lower urinary tract symptoms: Secondary | ICD-10-CM | POA: Diagnosis present

## 2018-04-06 DIAGNOSIS — Z9079 Acquired absence of other genital organ(s): Secondary | ICD-10-CM

## 2018-04-06 DIAGNOSIS — I2510 Atherosclerotic heart disease of native coronary artery without angina pectoris: Secondary | ICD-10-CM

## 2018-04-06 DIAGNOSIS — H9190 Unspecified hearing loss, unspecified ear: Secondary | ICD-10-CM | POA: Diagnosis present

## 2018-04-06 DIAGNOSIS — Z87440 Personal history of urinary (tract) infections: Secondary | ICD-10-CM

## 2018-04-06 DIAGNOSIS — Z886 Allergy status to analgesic agent status: Secondary | ICD-10-CM

## 2018-04-06 DIAGNOSIS — E669 Obesity, unspecified: Secondary | ICD-10-CM | POA: Diagnosis present

## 2018-04-06 DIAGNOSIS — N183 Chronic kidney disease, stage 3 unspecified: Secondary | ICD-10-CM | POA: Diagnosis present

## 2018-04-06 DIAGNOSIS — R531 Weakness: Secondary | ICD-10-CM | POA: Diagnosis not present

## 2018-04-06 DIAGNOSIS — Z8719 Personal history of other diseases of the digestive system: Secondary | ICD-10-CM

## 2018-04-06 DIAGNOSIS — K0889 Other specified disorders of teeth and supporting structures: Secondary | ICD-10-CM | POA: Diagnosis present

## 2018-04-06 DIAGNOSIS — I10 Essential (primary) hypertension: Secondary | ICD-10-CM | POA: Diagnosis not present

## 2018-04-06 DIAGNOSIS — G20 Parkinson's disease: Secondary | ICD-10-CM

## 2018-04-06 DIAGNOSIS — R270 Ataxia, unspecified: Secondary | ICD-10-CM | POA: Diagnosis not present

## 2018-04-06 DIAGNOSIS — I6523 Occlusion and stenosis of bilateral carotid arteries: Secondary | ICD-10-CM | POA: Diagnosis present

## 2018-04-06 DIAGNOSIS — R404 Transient alteration of awareness: Secondary | ICD-10-CM | POA: Diagnosis not present

## 2018-04-06 DIAGNOSIS — Z881 Allergy status to other antibiotic agents status: Secondary | ICD-10-CM

## 2018-04-06 DIAGNOSIS — Z9842 Cataract extraction status, left eye: Secondary | ICD-10-CM

## 2018-04-06 DIAGNOSIS — Z8249 Family history of ischemic heart disease and other diseases of the circulatory system: Secondary | ICD-10-CM

## 2018-04-06 DIAGNOSIS — Z79899 Other long term (current) drug therapy: Secondary | ICD-10-CM

## 2018-04-06 DIAGNOSIS — Z6825 Body mass index (BMI) 25.0-25.9, adult: Secondary | ICD-10-CM

## 2018-04-06 DIAGNOSIS — N401 Enlarged prostate with lower urinary tract symptoms: Secondary | ICD-10-CM | POA: Diagnosis present

## 2018-04-06 DIAGNOSIS — Z87891 Personal history of nicotine dependence: Secondary | ICD-10-CM

## 2018-04-06 DIAGNOSIS — Z955 Presence of coronary angioplasty implant and graft: Secondary | ICD-10-CM

## 2018-04-06 LAB — URINALYSIS, ROUTINE W REFLEX MICROSCOPIC
Bilirubin Urine: NEGATIVE
Glucose, UA: NEGATIVE mg/dL
Hgb urine dipstick: NEGATIVE
Ketones, ur: NEGATIVE mg/dL
LEUKOCYTES UA: NEGATIVE
NITRITE: NEGATIVE
Protein, ur: NEGATIVE mg/dL
SPECIFIC GRAVITY, URINE: 1.01 (ref 1.005–1.030)
pH: 7 (ref 5.0–8.0)

## 2018-04-06 LAB — BASIC METABOLIC PANEL
Anion gap: 7 (ref 5–15)
BUN: 18 mg/dL (ref 8–23)
CHLORIDE: 108 mmol/L (ref 98–111)
CO2: 28 mmol/L (ref 22–32)
Calcium: 9.5 mg/dL (ref 8.9–10.3)
Creatinine, Ser: 1.26 mg/dL — ABNORMAL HIGH (ref 0.61–1.24)
GFR calc non Af Amer: 52 mL/min — ABNORMAL LOW (ref >60)
Glucose, Bld: 120 mg/dL — ABNORMAL HIGH (ref 70–99)
POTASSIUM: 3.7 mmol/L (ref 3.5–5.1)
SODIUM: 143 mmol/L (ref 135–145)

## 2018-04-06 LAB — CBC
HCT: 41.2 % (ref 39.0–52.0)
HEMOGLOBIN: 14.5 g/dL (ref 13.0–17.0)
MCH: 31.7 pg (ref 26.0–34.0)
MCHC: 35.2 g/dL (ref 30.0–36.0)
MCV: 90 fL (ref 80.0–100.0)
NRBC: 0 % (ref 0.0–0.2)
Platelets: 132 10*3/uL — ABNORMAL LOW (ref 150–400)
RBC: 4.58 MIL/uL (ref 4.22–5.81)
RDW: 13.4 % (ref 11.5–15.5)
WBC: 8.5 10*3/uL (ref 4.0–10.5)

## 2018-04-06 MED ORDER — NITROGLYCERIN 0.4 MG SL SUBL
0.4000 mg | SUBLINGUAL_TABLET | SUBLINGUAL | Status: DC | PRN
  Administered 2018-04-06: 0.4 mg via SUBLINGUAL
  Filled 2018-04-06: qty 1

## 2018-04-06 MED ORDER — HYDRALAZINE HCL 50 MG PO TABS
50.0000 mg | ORAL_TABLET | Freq: Once | ORAL | Status: AC
  Administered 2018-04-06: 50 mg via ORAL
  Filled 2018-04-06: qty 1

## 2018-04-06 MED ORDER — ACETAMINOPHEN 325 MG PO TABS
650.0000 mg | ORAL_TABLET | Freq: Once | ORAL | Status: AC
  Administered 2018-04-06: 650 mg via ORAL
  Filled 2018-04-06: qty 2

## 2018-04-06 NOTE — ED Notes (Signed)
Bed: WA03 Expected date:  Expected time:  Means of arrival:  Comments: EMS/leg pain

## 2018-04-06 NOTE — ED Notes (Signed)
Attempted to call report RN Fefe unable to take at this time.

## 2018-04-06 NOTE — Clinical Social Work Note (Signed)
Clinical Social Work Assessment  Patient Details  Name: Darryl [name] MRN: [id number] Date of Birth: [birth year]  Date of referral:  04/06/18               Reason for consult:  Facility Placement                Permission sought to share information with:  Chartered certified accountant granted to share information::  Yes, Verbal Permission Granted  Name::        Agency::     Relationship::     Contact Information:     Housing/Transportation Living arrangements for the past 2 months:  Single Family Home Source of Information:  Spouse Patient Interpreter Needed:  None Criminal Activity/Legal Involvement Pertinent to Current Situation/Hospitalization:    Significant Relationships:    Lives with:  Spouse, Adult Children Do you feel safe going back to the place where you live?  No Need for family participation in patient care:  Yes (Comment)  Care giving concerns:  Pt has HX of dementia and Parkinsons. In past visits per wife and chart pt's acute nature, combativeness, etc has been attributed to UTI's in tandem with Parkinsons/Dementia but on this visit (and a previous visit per EPD who states that the UTI diagnosis which the pt's PCP was stating was not present and EDP says could be atttributed natural presence of bacteria on the skin which shows up as a UTI) a UTI is not present which could indicate the progressiveness of the Parkinsons/Dementia.  CSW spoke to wife who insists she cannot take pt home as pt cannot walk, cannot get to the bathroom and was growing very worried to the point of asking "what do I do?", constantly when speaking to the CSW.  CSW did not offer false hope but did state at times capacity waxes and wanes but the reality is preparing for the future is of the utmost importance and provided pt's wife with facilities list of both SNF's/ALF's that may offer locked facilities as well as the address and contact information for DSS and how to request a Medicaid  worker to assist with seeing if pt can apply for and possibly receive Medicaid.  CSW explained the need for special assistance Medicaid for ALF/Memory Care.  CSW also explained respite care and the average costs if pt's wife needed few days to apply for Medicaid.  CSW explained the barriers to placement for Memory Care with Medicare A&B/BCBS supplement and explained how if pt was admitted for observation this would not count as an inpatient stay day of which three would be required for a possible ?SNF stay review.  CSW went over this 2-3 times to make sure pt's wife understood.  Pt's wife voiced understanding and was appreciative but was presenting as emotional and melancholy with a slightly manic affect.  Pt's wife expressed gratitude pt was being admitted and presented as tearful and  CSW actively validated the pt's opinion/emotions and provided feedback and pt's wife insisted she was without support.  CSW asked and pt's wife stated she did have a daughter who was actively involved and supportive and has a SIL and two grandchildren who came to the hospital and played cards with the pt.  CSW spoke with the pt's wife about reaching out to the [facility] agency who had spoken to the pt's wife about palliative/hospice care and the EDP stated the medical floor provider could place a Palliative consult once pt was admitted.   Social Worker assessment /  plan:  CSW met with pt and confirmed pt's wife's to be discharged to a facility to live at discharge.  CSW provided active listening and validated pt's concerns but voiced encouragement for the pt's wife to enlist the help of family for in-home aide (pt's wife stated too expensive) for at least some hours out of the day and to reach out to the [facility] services agency for help with palliative care and hospice care in the home .   CSW did not offer false hope by asking permission to send out and complete FL-2 and send referrals out to SNF facilities via the hub at this point  in case pt were D/C'd tomorrow but perhaps BCBS supplement coulkd be explored to see how it could be utilized with the CSW Asst Director.  Pt has been living with his wife prior to being admitted to [facility].  Employment status:  Retired Health visitor, Managed Care PT Recommendations:  Not assessed at this time Information / Referral to community resources:     Patient/Family's Response to care:  Patient not alert and oriented.  Patient's wife still coming up with a plan.  Pt's wife/daughter supportive and strongly involved in pt.'s care.  Pt.'s wife sad ad worried, but pleasant and appreciated CSW intervention.    Patient/Family's Understanding of and Emotional Response to Diagnosis, Current Treatment, and Prognosis:  Still assessing   Emotional Assessment Appearance:    Attitude/Demeanor/Rapport:    Affect (typically observed):    Orientation:  Fluctuating Orientation (Suspected and/or reported Sundowners) Alcohol / Substance use:    Psych involvement (Current and /or in the community):     Discharge Needs  Concerns to be addressed:  Cognitive Concerns(Lack of support per pt's wife but there is a daughter and a SIL with two kids in [place]) Readmission within the last 30 days:  No Current discharge risk:  None Barriers to Discharge:  No Barriers Identified   Claudine Mouton, LCSWA 04/06/2018, 11:08 PM

## 2018-04-06 NOTE — H&P (Signed)
Darryl [name] OFB:[id number] DOB: 1934/07/31 DOA: 04/06/2018     PCP: [name], MD   Outpatient Specialists:   CARDS  Dr. Golden Hurter   NEurology     Dr.Tat   Urology Dr. Arman Bogus  Patient arrived to ER on 04/06/18 at 1338  Patient coming from: home Lives  With family   Chief Complaint:  Chief Complaint  Patient presents with  . Leg Pain  . Agitation    HPI: Darryl [name] is a 83 y.o. male with medical history significant of LBBB, CAD, HTN, CHF diastolic, HLD, Parkinson disease BPH, CVA tremor, thrombocytopenia or recurrent UTIs, Dementia    Presented with increased irritability and right leg pain. Patient has been nearly combative with EMTs. Family states that yesterday he felt bad but unsure in which way kind of pain all over trouble walking at baseline he walks with a walker he has chronic back pain but last night it was radiating down his legs as well no fevers per family trouble with increasing weakness and balance issues. family states he has had problems in the past Today got more agitated  started complaining last night and then today he started to have a lot of difficulty walking may have become maximally dependent and he  really cannot get around which is unusual for him trouble standing walking it was not just from pain.  Reports Chest pain started about 4 h ago. He is unble to provide detailed hx but states fills like pressure.    Hx of frequent UTI recently treated with Cipro and now on regular dose of keflex.   Regarding pertinent Chronic problems:  CADs/p remote CABG 11/2003 and DES to radial-OM and SVG to RCA 04/2004, BMS to LCx in 2010 Patient history of Parkinson disease on Sinemet  While in ER: Social work has been consulted ER MD suspicion may be the working worsening of his Parkinson  But given prior hx of stroke stable gait with leaning to the side would like to have further work-up including MRI of the brain in a.m. his urinalysis did  not look like UTI  In ER try to walk a week around knees and having a lot of difficulty with getting started to fall   did CT of the head showing chronic atrial atrophy and ischemic changes but no acute abnormality    The following Work up has been ordered so far:  Orders Placed This Encounter  Procedures  . Urine Culture  . DG Chest 2 View  . DG Lumbar Spine Complete  . CT Head Wo Contrast  . CBC  . Urinalysis, Routine w reflex microscopic  . Basic metabolic panel  . Ambulate patient  . Check temperature  . Consult to social work  . Consult to hospitalist  . ED EKG  . EKG 12-Lead  . EKG 12-Lead     Following Medications were ordered in ER: Medications  acetaminophen (TYLENOL) tablet 650 mg (650 mg Oral Given 04/06/18 1548)  hydrALAZINE (APRESOLINE) tablet 50 mg (50 mg Oral Given 04/06/18 1610)  hydrALAZINE (APRESOLINE) tablet 50 mg (50 mg Oral Given 04/06/18 2039)    Significant initial  Findings: Abnormal Labs Reviewed  CBC - Abnormal; Notable for the following components:      Result Value   Platelets 132 (*)    All other components within normal limits  URINALYSIS, ROUTINE W REFLEX MICROSCOPIC - Abnormal; Notable for the following components:   Color, Urine STRAW (*)    All  other components within normal limits  BASIC METABOLIC PANEL - Abnormal; Notable for the following components:   Glucose, Bld 120 (*)    Creatinine, Ser 1.26 (*)    GFR calc non Af Amer 52 (*)    All other components within normal limits     Na 143 K 3.7  Cr   stable,   Lab Results  Component Value Date   CREATININE 1.26 (H) 04/06/2018   CREATININE 1.33 (H) 03/20/2018   CREATININE 1.37 (H) 01/29/2018      WBC  8.5  HG/HCT stable,     Component Value Date/Time   HGB 14.5 04/06/2018 1600   HCT 41.2 04/06/2018 1600       BNP (last 3 results) Recent Labs    11/15/17 0231  BNP 194.5*    ProBNP (last 3 results) No results for input(s): PROBNP in the last 8760  hours.  Lactic Acid, Venous    Component Value Date/Time   LATICACIDVEN 1.44 01/29/2018 1541      UA   no evidence of UTI      CT HEAD NON acute  CXR -  NON acute  Lumbar spine no fracture degenerative disc disease  ECG:  Personally reviewed by me showing: HR : 56 Rhythm:  LBBB,    no evidence of ischemic changes QTC 473      ED Triage Vitals  Enc Vitals Group     BP 04/06/18 1354 (!) 215/81     Pulse Rate 04/06/18 1354 (!) 55     Resp 04/06/18 1354 18     Temp 04/06/18 1354 98.2 F (36.8 C)     Temp Source 04/06/18 1354 Oral     SpO2 04/06/18 1349 95 %     Weight 04/06/18 1356 175 lb (79.4 kg)     Height 04/06/18 1356 5\' 9"  (1.753 m)     Head Circumference --      Peak Flow --      Pain Score 04/06/18 1914 0     Pain Loc --      Pain Edu? --      Excl. in [place]? --   TMAX(24)@       Latest  Blood pressure (!) 164/91, pulse 66, temperature 98.2 F (36.8 C), resp. rate (!) 21, height 5\' 9"  (1.753 m), weight 79.4 kg, SpO2 98 %.   Hospitalist was called for admission for encephalopathy and unstable gait   Review of Systems:    Pertinent positives include: Unstable gait, fatigue,  Confusion, chest pain,   Constitutional:  No weight loss, night sweats, Fevers, chills, weight loss  HEENT:  No headaches, Difficulty swallowing,Tooth/dental problems,Sore throat,  No sneezing, itching, ear ache, nasal congestion, post nasal drip,  Cardio-vascular:  No Orthopnea, PND, anasarca, dizziness, palpitations.no Bilateral lower extremity swelling  GI:  No heartburn, indigestion, abdominal pain, nausea, vomiting, diarrhea, change in bowel habits, loss of appetite, melena, blood in stool, hematemesis Resp:  no shortness of breath at rest. No dyspnea on exertion, No excess mucus, no productive cough, No non-productive cough, No coughing up of blood.No change in color of mucus.No wheezing. Skin:  no rash or lesions. No jaundice GU:  no dysuria, change in color of urine,  no urgency or frequency. No straining to urinate.  No flank pain.  Musculoskeletal:  No joint pain or no joint swelling. No decreased range of motion. No back pain.  Psych:  No change in mood or affect. No depression or anxiety. No memory  loss.  Neuro:  no tingling, no weakness, no double vision, no slurred speech   All systems reviewed and apart from [place] all are negative  Past Medical History:   Past Medical History:  Diagnosis Date  . Arthritis   . BPH (benign prostatic hypertrophy) with urinary retention   . Chronic diastolic CHF (congestive heart failure) ([place])   . Colon polyp   . Coronary atherosclerosis of native coronary artery    a. s/p CABG 11/2003. b. s/p DES to radial-OM and SVG-RCA 11/05. c. s/p BMS to LCx 11/2008.  . Dilated aortic root ([place]) 05/29/2014   14mm by echo 04/2017  . Diverticulosis   . Dyslipidemia    statin intolerant  . Elevated fasting glucose   . Gastritis    a. by EGD 04/2004.  . H/O urinary retention    post surgical procedures; has foley cath  . Headache(784.0)   . Hiatal hernia   . HOH (hard of hearing)    slight hoh, rt>l  . HTN (hypertension)   . Hypertensive heart disease   . Insomnia   . Intracranial hemorrhage ([place])    a. small pontine [place] 2003.  [name] LBBB (left bundle branch block)   . Lumbar spondylolysis    multilevel DJD with mild spinal stenosis L3-L5  . Migraines   . Parkinson's disease ([place])   . Peptic ulcer disease    a. prior hx of bleeding ulcers, including 12/2003 in setting of aspirin use.  . Renal insufficiency    renal duplex scan in 2012 showed no renal artery stenosis  . Sleep disturbance   . Stroke [facility])    no clinical manifestations - seen on neuroimaging  . Thrombocytopenia ([place])   . Tremor   . UTI (urinary tract infection)   . Wears glasses       Past Surgical History:  Procedure Laterality Date  . APPENDECTOMY  62   1963  . CATARACT EXTRACTION, BILATERAL    . CHOLECYSTECTOMY  1998  . COLON SURGERY   03/14/2008  . COLONOSCOPY  10/2001  . CORONARY ANGIOPLASTY  6/10,10/05   Dr. [name] cardiologist: last office visit 11/07/14  . CORONARY ARTERY BYPASS GRAFT  2005  . CYSTOSCOPY WITH LITHOLAPAXY N/A 11/19/2014   Procedure: CYSTOSCOPY WITH LITHOLAPAXY;  Surgeon: [name], MD;  Location: [facility];  Service: Urology;  Laterality: N/A;  . ESOPHAGOGASTRODUODENOSCOPY     10/2001, 12/2003, 04/2004  . LUMBAR LAMINECTOMY/DECOMPRESSION MICRODISCECTOMY N/A 05/03/2013   Procedure: LUMBAR LAMINECTOMY/DCOMPRESSION MICRODISCECTOMY RIGHT LUMBAR FOUR FIVE;  Surgeon: [name], MD;  Location: [facility];  Service: Neurosurgery;  Laterality: N/A;  . SHOULDER ARTHROSCOPY Left 9/10  . stones     hx  . TRANSURETHRAL RESECTION OF PROSTATE N/A 11/19/2014   Procedure: TRANSURETHRAL RESECTION OF THE PROSTATE WITH GYRUS INSTRUMENTS;  Surgeon: [name], MD;  Location: [facility];  Service: Urology;  Laterality: N/A;  . WISDOM TOOTH EXTRACTION      Social History:  Ambulatory   Cane or walker      reports that he quit smoking about 58 years ago. His smoking use included cigarettes. He has a 10.00 pack-year smoking history. He has never used smokeless tobacco. He reports that he does not drink alcohol or use drugs.     Family History:   Family History  Problem Relation Age of Onset  . CAD Father 24       ASCAD  . CAD Sister   . Diabetes  Child     Allergies: Allergies  Allergen Reactions  . Ambien [Zolpidem Tartrate] Other (See Comments)    hallucinations  . Aspirin Other (See Comments)    ulcers  . Celebrex [Celecoxib] Other (See Comments)    Due to ulcer.  . Nsaids Other (See Comments)    Due to ulcer.  . Toprol Xl [Metoprolol Tartrate] Swelling  . Altace [Ramipril]     UNKNOWN  . Amlodipine     Feet swell  . Levothyroxine Other (See Comments)    "made him feel bad"  . Biaxin [Clarithromycin] Nausea Only  . Tetracyclines & Related Nausea Only      Prior to Admission medications   Medication Sig Start Date End Date Taking? Authorizing Provider  acetaminophen (TYLENOL) 500 MG tablet Take 1,000 mg by mouth every 8 (eight) hours as needed for mild pain or moderate pain.   Yes [provider]  aspirin EC 81 MG tablet Take 81 mg by mouth. Patient takes 3 times a week   Yes [provider]  Carbidopa-Levodopa ER (SINEMET CR) 25-100 MG tablet controlled release Take 1 tablet by mouth 3 (three) times daily. 12/17/17  Yes Tat, Eustace Quail, DO  carvedilol (COREG) 6.25 MG tablet Take 1 tablet (6.25 mg total) by mouth 2 (two) times daily. 03/23/18  Yes Turner, Eber Hong, MD  cephALEXin (KEFLEX) 250 MG capsule Take 250 mg by mouth at bedtime.  03/17/18  Yes [provider]  citalopram (CELEXA) 20 MG tablet Take 20 mg by mouth at bedtime.    Yes [provider]  cloNIDine (CATAPRES) 0.2 MG tablet Take 1.5 tablets (0.3 mg total) by mouth 2 (two) times daily. 03/23/18 03/23/19 Yes Turner, Eber Hong, MD  furosemide (LASIX) 20 MG tablet Take 1 tablet (20 mg total) by mouth daily. 03/23/18 03/18/19 Yes Turner, Eber Hong, MD  hydrALAZINE (APRESOLINE) 25 MG tablet Take 1 tablet (25 mg total) by mouth 3 (three) times daily. 08/31/17 08/26/18 Yes Turner, Eber Hong, MD  irbesartan (AVAPRO) 300 MG tablet Take 300 mg by mouth at bedtime.    Yes [provider]  KLOR-CON 10 10 MEQ tablet Take 1 tablet (10 mEq total) by mouth daily. 03/23/18 03/18/19 Yes Turner, Eber Hong, MD  pantoprazole (PROTONIX) 40 MG tablet Take 40 mg by mouth 3 (three) times a week.    Yes [provider]  tamsulosin (FLOMAX) 0.4 MG CAPS capsule Take 0.4 mg by mouth daily. 02/01/18  Yes [provider]  traZODone (DESYREL) 50 MG tablet TAKE 1 TABLET BY MOUTH EVERYDAY AT BEDTIME Patient taking differently: Take 25 mg by mouth at bedtime.  02/14/18  Yes Tat, Eustace Quail, DO  ciprofloxacin (CIPRO) 500 MG tablet Take 1 tablet (500 mg total) by mouth 2 (two)  times daily. One po bid x 7 days Patient not taking: Reported on 04/06/2018 03/20/18   Drenda Freeze, MD  LORazepam (ATIVAN) 1 MG tablet Take 1 tablet (1 mg total) by mouth 2 (two) times daily as needed for anxiety. 03/20/18   Drenda Freeze, MD  Maltodextrin-Xanthan Gum ([place]) POWD As needed with meals. Patient not taking: Reported on 04/06/2018 11/17/17   Hosie Poisson, MD  nitroGLYCERIN (NITROSTAT) 0.4 MG SL tablet Place 1 tablet (0.4 mg total) under the tongue every 5 (five) minutes as needed for chest pain. 08/31/17   Sueanne Margarita, MD   Physical Exam: Blood pressure (!) 164/91, pulse 66, temperature 98.2 F (36.8 C), resp. rate (!) 21,  height 5\' 9"  (1.753 m), weight 79.4 kg, SpO2 98 %. 1. General:  in No Acute distress   Chronically ill  -appearing 2. Psychological: Alert but not Oriented 3. Head/ENT:     Dry Mucous Membranes                          Head Non traumatic, neck supple                            Poor Dentition 4. SKIN decreased Skin turgor,  Skin clean Dry and intact no rash 5. Heart: Regular rate and rhythm no Murmur, no Rub or gallop 6. Lungs:  no wheezes or crackles   7. Abdomen: Soft, non-tender, Non distended  obese  bowel sounds present 8. Lower extremities: no clubbing, cyanosis, or edema 9. Neurologically Grossly intact, moving all 4 extremities equally   10. MSK: Normal range of motion   LABS:     Recent Labs  Lab 04/06/18 1600  WBC 8.5  HGB 14.5  HCT 41.2  MCV 90.0  PLT 283*   Basic Metabolic Panel: Recent Labs  Lab 04/06/18 1600  NA 143  K 3.7  CL 108  CO2 28  GLUCOSE 120*  BUN 18  CREATININE 1.26*  CALCIUM 9.5      No results for input(s): AST, ALT, ALKPHOS, BILITOT, PROT, ALBUMIN in the last 168 hours. No results for input(s): LIPASE, AMYLASE in the last 168 hours. No results for input(s): AMMONIA in the last 168 hours.    HbA1C: No results for input(s): HGBA1C in the last 72 hours. CBG: No results  for input(s): GLUCAP in the last 168 hours.    Urine analysis:    Component Value Date/Time   COLORURINE STRAW (A) 04/06/2018 1549   APPEARANCEUR CLEAR 04/06/2018 1549   LABSPEC 1.010 04/06/2018 1549   PHURINE 7.0 04/06/2018 1549   GLUCOSEU NEGATIVE 04/06/2018 1549   HGBUR NEGATIVE 04/06/2018 1549   BILIRUBINUR NEGATIVE 04/06/2018 1549   KETONESUR NEGATIVE 04/06/2018 1549   PROTEINUR NEGATIVE 04/06/2018 1549   UROBILINOGEN 1.0 10/11/2014 1609   NITRITE NEGATIVE 04/06/2018 1549   LEUKOCYTESUR NEGATIVE 04/06/2018 1549       Cultures:    Component Value Date/Time   SDES  03/20/2018 1455    URINE, CLEAN CATCH Performed at [facility], [place] [address] [place], [place] [address]    SPECREQUEST  03/20/2018 1455    NONE Performed at [facility], [place] [address], [place], [place] [address]    CULT (A) 03/20/2018 1455    30,000 COLONIES/mL ESCHERICHIA COLI 30,000 COLONIES/mL ENTEROCOCCUS FAECALIS    REPTSTATUS 03/23/2018 FINAL 03/20/2018 1455     Radiological Exams on Admission: Dg Chest 2 View  Result Date: 04/06/2018 CLINICAL DATA:  Weakness EXAM: CHEST - 2 VIEW COMPARISON:  03/20/2018 chest radiograph. FINDINGS: Intact sternotomy wires. Stable cardiomediastinal silhouette with normal heart size. No pneumothorax. No pleural effusion. Lungs appear clear, with no acute consolidative airspace disease and no pulmonary edema. IMPRESSION: No active cardiopulmonary disease. Electronically Signed   By: Ilona Sorrel M.D.   On: 04/06/2018 18:23   Dg Lumbar Spine Complete  Result Date: 04/06/2018 CLINICAL DATA:  Weakness. No reported injury. EXAM: LUMBAR SPINE - COMPLETE 4+ VIEW COMPARISON:  09/08/2010 CT abdomen/pelvis FINDINGS: This report assumes 5 non rib-bearing lumbar vertebrae. Lumbar vertebral body heights are preserved, with no fracture. Moderate multilevel lumbar degenerative  disc disease, most prominent at L1-2. No spondylolisthesis.  Bilateral L5 pars defects, better seen on 2012 CT. Mild lower lumbar facet arthropathy. No aggressive appearing focal osseous lesions. IMPRESSION: 1. No lumbar spine fracture or spondylolisthesis. 2. Moderate multilevel lumbar degenerative disc disease, most prominent at L1-2. 3. Bilateral L5 pars defects, better seen on 2012 CT. 4. Mild lower lumbar facet arthropathy. Electronically Signed   By: Ilona Sorrel M.D.   On: 04/06/2018 18:27   Ct Head Wo Contrast  Result Date: 04/06/2018 CLINICAL DATA:  Altered mental status EXAM: CT HEAD WITHOUT CONTRAST TECHNIQUE: Contiguous axial images were obtained from the base of the skull through the vertex without intravenous contrast. COMPARISON:  03/20/2018 FINDINGS: Brain: Mild atrophic changes and chronic white matter ischemic changes are again seen and stable. No findings to suggest acute hemorrhage, acute infarction or space-occupying mass lesion are seen. Vascular: No hyperdense vessel or unexpected calcification. Skull: Normal. Negative for fracture or focal lesion. Sinuses/Orbits: No acute finding. Other: None. IMPRESSION: Chronic atrophic and ischemic changes without acute abnormality. Electronically Signed   By: Inez Catalina M.D.   On: 04/06/2018 21:37    Chart has been reviewed    Assessment/Plan   83 y.o. male with medical history significant of LBBB, CAD, HTN, CHF diastolic, HLD, Parkinson disease BPH, CVA tremor, thrombocytopenia or recurrent UTIs  Admitted for acute encephalopathy and unstable gait  Present on Admission: . Chest pain - HX of CAD,cycle CE, obtain ECHO obtain cardiology consult  . Acute encephalopathy-   - most likely multifactorial secondary to combination of   mild dehydration secondary to decreased by mouth intake, given chest pain can not rule out ischemic disease  - Will rehydrate  Given unstable gait leaning to one side and unable to provide history from the patient with right lower extremity weakness will need further  imaging to evaluate for CNS pathology pathology such as MRI of the brain if able to tolerate  - neurological exam appears with right lower extremity weakness unclear duration unable to cooperate fully      - no history of liver disease   . Parkinson's disease ([place]) continue home medications currently stable.  Family is inquiring regarding palliative care assessment and possible placement . LBBB (left bundle branch block) chronic continue to monitor . Dyslipidemia stable continue home medications  . Chronic diastolic heart failure (HCC) appears to be somewhat on the dry side will hold diuretics for tonight give gentle fluids . CAD (coronary artery disease), native coronary artery continue aspirin and beta-blocker monitor on telemetry cycle cardiac enzymes given chest pain . Benign prostatic hyperplasia - stable . Unstable gait -will need PT OT evaluation to discharge Decreased p.o. intake will check prealbumin and order nutritional consult   Other plan as per orders.  DVT prophylaxis:  SCD      Code Status:    DNR/DNI   as per  family  I had personally discussed CODE STATUS with patient and family   Family Communication:   Family  at  Bedside  plan of care was discussed with  Wife,   Disposition Plan:    likely will need placement for rehabilitation                                            Would benefit from PT/OT eval prior to DC  Ordered  Swallow eval - SLP ordered                   Social Work  consulted                   Nutrition    consulted                     Palliative care    consulted                                    Consults called: email cardiology    Admission status:  Obs    Level of care     tele  For  24H        Jerilee Space 04/06/2018, 11:47 PM    [facility]  Pager [phone]   after 2 AM please page floor coverage PA If 7AM-7PM, please contact the day team taking care of the patient  [email or web address]  Password [email or web address]

## 2018-04-06 NOTE — ED Provider Notes (Signed)
[facility] DEPT Provider Note   CSN: [id number] Arrival date & time: 04/06/18  1338     History   Chief Complaint Chief Complaint  Patient presents with  . Leg Pain  . Agitation    HPI Darryl [name] is a 83 y.o. male.  HPI Pt started having some aching in his legs last night.  He has also had a mild cough.   Wife took his blood pressure this morning and it was elevated.   Pt state this morning that he felt bad all over and he was having trouble walking although he was able to walk with assistance to the ambulance. He does have chronic back pain issues and sees a pain doctor.   Last night the pain was going down the legs.   No fevers.  Pt states he is feeling better now.  He does have a history of parkinson and his wife states he minimizes his symptoms.      Past Medical History:  Diagnosis Date  . Arthritis   . BPH (benign prostatic hypertrophy) with urinary retention   . Chronic diastolic CHF (congestive heart failure) ([place])   . Colon polyp   . Coronary atherosclerosis of native coronary artery    a. s/p CABG 11/2003. b. s/p DES to radial-OM and SVG-RCA 11/05. c. s/p BMS to LCx 11/2008.  . Dilated aortic root ([place]) 05/29/2014   22mm by echo 04/2017  . Diverticulosis   . Dyslipidemia    statin intolerant  . Elevated fasting glucose   . Gastritis    a. by EGD 04/2004.  . H/O urinary retention    post surgical procedures; has foley cath  . Headache(784.0)   . Hiatal hernia   . HOH (hard of hearing)    slight hoh, rt>l  . HTN (hypertension)   . Hypertensive heart disease   . Insomnia   . Intracranial hemorrhage ([place])    a. small pontine [place] 2003.  [name] LBBB (left bundle branch block)   . Lumbar spondylolysis    multilevel DJD with mild spinal stenosis L3-L5  . Migraines   . Parkinson's disease ([place])   . Peptic ulcer disease    a. prior hx of bleeding ulcers, including 12/2003 in setting of aspirin use.  . Renal insufficiency    renal duplex scan in 2012 showed no renal artery stenosis  . Sleep disturbance   . Stroke [facility])    no clinical manifestations - seen on neuroimaging  . Thrombocytopenia ([place])   . Tremor   . UTI (urinary tract infection)   . Wears glasses     Patient Active Problem List   Diagnosis Date Noted  . Aspiration pneumonia ([place]) 11/15/2017  . B12 deficiency 07/13/2016  . Orthostatic hypotension 03/26/2016  . Chronic diastolic heart failure ([place]) 12/02/2015  . CAD (coronary artery disease), native coronary artery 11/28/2014  . BPH (benign prostatic hypertrophy) with urinary retention 11/19/2014  . Diastolic dysfunction 55/73/2202  . UTI (urinary tract infection) 10/07/2014  . Abnormal EKG 10/04/2014  . Urinary retention 10/04/2014  . BPH (benign prostatic hypertrophy) 10/04/2014  . UTI (lower urinary tract infection) 10/03/2014  . Sepsis ([place]) 10/03/2014  . Parkinsonism ([place]) 06/12/2014  . Dilated aortic root ([place]) 05/29/2014  . CKD (chronic kidney disease), stage III ([place]) 04/25/2014  . Gastritis   . Peptic ulcer disease   . Stroke ([place])   . Hx of CABG 2005, PCI 2010   . Parkinson's disease ([place])   .  Small vessel disease, cerebrovascular 10/30/2013  . Intracerebral hemorrhage ([place]) 10/30/2013  . Tremor 08/14/2013  . Benign essential HTN 04/13/2013  . Dyslipidemia 04/13/2013  . LBBB (left bundle branch block) 04/13/2013  . Preoperative clearance 04/13/2013  . DJD (degenerative joint disease) of lumbar spine 04/13/2013    Past Surgical History:  Procedure Laterality Date  . APPENDECTOMY  62   1963  . CATARACT EXTRACTION, BILATERAL    . CHOLECYSTECTOMY  1998  . COLON SURGERY  03/14/2008  . COLONOSCOPY  10/2001  . CORONARY ANGIOPLASTY  6/10,10/05   Dr. [name] cardiologist: last office visit 11/07/14  . CORONARY ARTERY BYPASS GRAFT  2005  . CYSTOSCOPY WITH LITHOLAPAXY N/A 11/19/2014   Procedure: CYSTOSCOPY WITH LITHOLAPAXY;  Surgeon: [name], MD;  Location: [facility];  Service: Urology;  Laterality: N/A;  . ESOPHAGOGASTRODUODENOSCOPY     10/2001, 12/2003, 04/2004  . LUMBAR LAMINECTOMY/DECOMPRESSION MICRODISCECTOMY N/A 05/03/2013   Procedure: LUMBAR LAMINECTOMY/DCOMPRESSION MICRODISCECTOMY RIGHT LUMBAR FOUR FIVE;  Surgeon: [name], MD;  Location: [facility];  Service: Neurosurgery;  Laterality: N/A;  . SHOULDER ARTHROSCOPY Left 9/10  . stones     hx  . TRANSURETHRAL RESECTION OF PROSTATE N/A 11/19/2014   Procedure: TRANSURETHRAL RESECTION OF THE PROSTATE WITH GYRUS INSTRUMENTS;  Surgeon: [name], MD;  Location: [facility];  Service: Urology;  Laterality: N/A;  . WISDOM TOOTH EXTRACTION          Home Medications    Prior to Admission medications   Medication Sig Start Date End Date Taking? Authorizing Provider  acetaminophen (TYLENOL) 500 MG tablet Take 1,000 mg by mouth every 8 (eight) hours as needed for mild pain or moderate pain.   Yes [provider]  aspirin EC 81 MG tablet Take 81 mg by mouth. Patient takes 3 times a week   Yes [provider]  Carbidopa-Levodopa ER (SINEMET CR) 25-100 MG tablet controlled release Take 1 tablet by mouth 3 (three) times daily. 12/17/17  Yes Tat, Eustace Quail, DO  carvedilol (COREG) 6.25 MG tablet Take 1 tablet (6.25 mg total) by mouth 2 (two) times daily. 03/23/18  Yes Turner, Eber Hong, MD  cephALEXin (KEFLEX) 250 MG capsule Take 250 mg by mouth at bedtime.  03/17/18  Yes [provider]  citalopram (CELEXA) 20 MG tablet Take 20 mg by mouth at bedtime.    Yes [provider]  cloNIDine (CATAPRES) 0.2 MG tablet Take 1.5 tablets (0.3 mg total) by mouth 2 (two) times daily. 03/23/18 03/23/19 Yes Turner, Eber Hong, MD  furosemide (LASIX) 20 MG tablet Take 1 tablet (20 mg total) by mouth daily. 03/23/18 03/18/19 Yes Turner, Eber Hong, MD  hydrALAZINE (APRESOLINE) 25 MG tablet Take 1 tablet (25 mg total) by mouth 3 (three) times daily. 08/31/17 08/26/18 Yes  Turner, Eber Hong, MD  irbesartan (AVAPRO) 300 MG tablet Take 300 mg by mouth at bedtime.    Yes [provider]  KLOR-CON 10 10 MEQ tablet Take 1 tablet (10 mEq total) by mouth daily. 03/23/18 03/18/19 Yes Turner, Eber Hong, MD  pantoprazole (PROTONIX) 40 MG tablet Take 40 mg by mouth 3 (three) times a week.    Yes [provider]  tamsulosin (FLOMAX) 0.4 MG CAPS capsule Take 0.4 mg by mouth daily. 02/01/18  Yes [provider]  traZODone (DESYREL) 50 MG tablet TAKE 1 TABLET BY MOUTH EVERYDAY AT BEDTIME Patient taking differently: Take 25 mg by mouth at bedtime.  02/14/18  Yes  Ludwig Clarks, DO  ciprofloxacin (CIPRO) 500 MG tablet Take 1 tablet (500 mg total) by mouth 2 (two) times daily. One po bid x 7 days Patient not taking: Reported on 04/06/2018 03/20/18   Drenda Freeze, MD  LORazepam (ATIVAN) 1 MG tablet Take 1 tablet (1 mg total) by mouth 2 (two) times daily as needed for anxiety. 03/20/18   Drenda Freeze, MD  Maltodextrin-Xanthan Gum ([place]) POWD As needed with meals. Patient not taking: Reported on 04/06/2018 11/17/17   Hosie Poisson, MD  nitroGLYCERIN (NITROSTAT) 0.4 MG SL tablet Place 1 tablet (0.4 mg total) under the tongue every 5 (five) minutes as needed for chest pain. 08/31/17   Sueanne Margarita, MD    Family History Family History  Problem Relation Age of Onset  . CAD Father 68       ASCAD  . CAD Sister   . Diabetes Child     Social History Social History   Tobacco Use  . Smoking status: Former Smoker    Packs/day: 2.00    Years: 5.00    Pack years: 10.00    Types: Cigarettes    Last attempt to quit: 04/28/1959    Years since quitting: 58.9  . Smokeless tobacco: Never Used  . Tobacco comment: 55+ yrs ago  Substance Use Topics  . Alcohol use: No  . Drug use: No     Allergies   Ambien [zolpidem tartrate]; Aspirin; Celebrex [celecoxib]; Nsaids; Toprol xl [metoprolol tartrate]; Altace [ramipril]; Amlodipine;  Levothyroxine; Biaxin [clarithromycin]; and Tetracyclines & related   Review of Systems Review of Systems  All other systems reviewed and are negative.    Physical Exam Updated Vital Signs BP (!) 164/91   Pulse 66   Temp 98.2 F (36.8 C)   Resp (!) 21   Ht 1.753 m (5\' 9" )   Wt 79.4 kg   SpO2 98%   BMI 25.84 kg/m   Physical Exam  Constitutional: He appears well-developed and well-nourished. No distress.  HENT:  Head: Normocephalic and atraumatic.  Right Ear: External ear normal.  Left Ear: External ear normal.  Eyes: Conjunctivae are normal. Right eye exhibits no discharge. Left eye exhibits no discharge. No scleral icterus.  Neck: Neck supple. No tracheal deviation present.  Cardiovascular: Normal rate, regular rhythm and intact distal pulses.  Pulmonary/Chest: Effort normal and breath sounds normal. No stridor. No respiratory distress. He has no wheezes. He has no rales.  Abdominal: Soft. Bowel sounds are normal. He exhibits no distension. There is no tenderness. There is no rebound and no guarding.  Musculoskeletal: He exhibits no edema or tenderness.  No erythema or signs of cellulitis   Neurological: He is alert. He has normal strength. No cranial nerve deficit (no facial droop, extraocular movements intact, no slurred speech) or sensory deficit. He exhibits normal muscle tone. He displays no seizure activity. Coordination normal.  Skin: Skin is warm and dry. No rash noted.  Psychiatric: He has a normal mood and affect.  Nursing note and vitals reviewed.    ED Treatments / Results  Labs (all labs ordered are listed, but only abnormal results are displayed) Labs Reviewed  CBC - Abnormal; Notable for the following components:      Result Value   Platelets 132 (*)    All other components within normal limits  URINALYSIS, ROUTINE W REFLEX MICROSCOPIC - Abnormal; Notable for the following components:   Color, Urine STRAW (*)    All other components within  normal  limits  BASIC METABOLIC PANEL - Abnormal; Notable for the following components:   Glucose, Bld 120 (*)    Creatinine, Ser 1.26 (*)    GFR calc non Af Amer 52 (*)    All other components within normal limits  URINE CULTURE    EKG EKG Interpretation  Date/Time:  Wednesday April 06 2018 15:36:16 EDT Ventricular Rate:  56 PR Interval:    QRS Duration: 152 QT Interval:  490 QTC Calculation: 473 R Axis:   -18 Text Interpretation:  Sinus rhythm Left bundle branch block No significant change since last tracing Confirmed by Dorie Rank [phone]) on 04/06/2018 8:20:52 PM   Radiology Dg Chest 2 View  Result Date: 04/06/2018 CLINICAL DATA:  Weakness EXAM: CHEST - 2 VIEW COMPARISON:  03/20/2018 chest radiograph. FINDINGS: Intact sternotomy wires. Stable cardiomediastinal silhouette with normal heart size. No pneumothorax. No pleural effusion. Lungs appear clear, with no acute consolidative airspace disease and no pulmonary edema. IMPRESSION: No active cardiopulmonary disease. Electronically Signed   By: Ilona Sorrel M.D.   On: 04/06/2018 18:23   Dg Lumbar Spine Complete  Result Date: 04/06/2018 CLINICAL DATA:  Weakness. No reported injury. EXAM: LUMBAR SPINE - COMPLETE 4+ VIEW COMPARISON:  09/08/2010 CT abdomen/pelvis FINDINGS: This report assumes 5 non rib-bearing lumbar vertebrae. Lumbar vertebral body heights are preserved, with no fracture. Moderate multilevel lumbar degenerative disc disease, most prominent at L1-2. No spondylolisthesis. Bilateral L5 pars defects, better seen on 2012 CT. Mild lower lumbar facet arthropathy. No aggressive appearing focal osseous lesions. IMPRESSION: 1. No lumbar spine fracture or spondylolisthesis. 2. Moderate multilevel lumbar degenerative disc disease, most prominent at L1-2. 3. Bilateral L5 pars defects, better seen on 2012 CT. 4. Mild lower lumbar facet arthropathy. Electronically Signed   By: Ilona Sorrel M.D.   On: 04/06/2018 18:27   Ct Head Wo  Contrast  Result Date: 04/06/2018 CLINICAL DATA:  Altered mental status EXAM: CT HEAD WITHOUT CONTRAST TECHNIQUE: Contiguous axial images were obtained from the base of the skull through the vertex without intravenous contrast. COMPARISON:  03/20/2018 FINDINGS: Brain: Mild atrophic changes and chronic white matter ischemic changes are again seen and stable. No findings to suggest acute hemorrhage, acute infarction or space-occupying mass lesion are seen. Vascular: No hyperdense vessel or unexpected calcification. Skull: Normal. Negative for fracture or focal lesion. Sinuses/Orbits: No acute finding. Other: None. IMPRESSION: Chronic atrophic and ischemic changes without acute abnormality. Electronically Signed   By: Inez Catalina M.D.   On: 04/06/2018 21:37    Procedures Procedures (including critical care time)  Medications Ordered in ED Medications  acetaminophen (TYLENOL) tablet 650 mg (650 mg Oral Given 04/06/18 1548)  hydrALAZINE (APRESOLINE) tablet 50 mg (50 mg Oral Given 04/06/18 1610)  hydrALAZINE (APRESOLINE) tablet 50 mg (50 mg Oral Given 04/06/18 2039)     Initial Impression / Assessment and Plan / ED Course  I have reviewed the triage vital signs and the nursing notes.  Pertinent labs & imaging results that were available during my care of the patient were reviewed by me and considered in my medical decision making (see chart for details).  Clinical Course as of Apr 06 2216  Wed Apr 06, 2018  1800 Family requests urine culture because of his frequent UTIs   [JK]  2117 Pt continues to have difficulty walking.  Requiring 2 people to assist.     [JK]  2217 No acute findings noted on CT.   [JK]    Clinical Course User Index [  JK] Dorie Rank, MD    Pt presents with weakness, difficulty with ambulation.  Family concenred about infection.  No signs of infection in the ED.   Rest of labs and xrays unremarble.  Attemped ambulation but pt is having difficulty still despite pain  management. FAmily feels he is unsafe to go home.  Sx could be related to his parkinson.  Occult stroke is a concern.   Will ct head.  Anticipate admission.  May need MRI in the am.   Final Clinical Impressions(s) / ED Diagnoses   Final diagnoses:  Ataxia  Parkinson disease ([place])       Dorie Rank, MD 04/06/18 2217

## 2018-04-06 NOTE — ED Triage Notes (Signed)
Per EMS: Increased irritability, and R leg pain.  Pt hx of chronic leg pain and dementia.  Pt has hx of UTI's, wife states he becomes irritable like this when he has a UTI.  EMS states pt is borderline combative.  Hx of left bundle branch block.

## 2018-04-07 ENCOUNTER — Inpatient Hospital Stay (HOSPITAL_COMMUNITY): Payer: Medicare Other

## 2018-04-07 ENCOUNTER — Telehealth: Payer: Self-pay | Admitting: Neurology

## 2018-04-07 ENCOUNTER — Other Ambulatory Visit: Payer: Self-pay

## 2018-04-07 DIAGNOSIS — G4700 Insomnia, unspecified: Secondary | ICD-10-CM | POA: Diagnosis present

## 2018-04-07 DIAGNOSIS — N183 Chronic kidney disease, stage 3 (moderate): Secondary | ICD-10-CM | POA: Diagnosis present

## 2018-04-07 DIAGNOSIS — R079 Chest pain, unspecified: Secondary | ICD-10-CM | POA: Diagnosis present

## 2018-04-07 DIAGNOSIS — I509 Heart failure, unspecified: Secondary | ICD-10-CM | POA: Diagnosis not present

## 2018-04-07 DIAGNOSIS — I679 Cerebrovascular disease, unspecified: Secondary | ICD-10-CM | POA: Diagnosis not present

## 2018-04-07 DIAGNOSIS — E860 Dehydration: Secondary | ICD-10-CM | POA: Diagnosis present

## 2018-04-07 DIAGNOSIS — G9340 Encephalopathy, unspecified: Secondary | ICD-10-CM

## 2018-04-07 DIAGNOSIS — R338 Other retention of urine: Secondary | ICD-10-CM | POA: Diagnosis present

## 2018-04-07 DIAGNOSIS — I340 Nonrheumatic mitral (valve) insufficiency: Secondary | ICD-10-CM | POA: Diagnosis not present

## 2018-04-07 DIAGNOSIS — Z66 Do not resuscitate: Secondary | ICD-10-CM | POA: Diagnosis present

## 2018-04-07 DIAGNOSIS — R2681 Unsteadiness on feet: Secondary | ICD-10-CM

## 2018-04-07 DIAGNOSIS — I2510 Atherosclerotic heart disease of native coronary artery without angina pectoris: Secondary | ICD-10-CM | POA: Diagnosis present

## 2018-04-07 DIAGNOSIS — F339 Major depressive disorder, recurrent, unspecified: Secondary | ICD-10-CM | POA: Diagnosis not present

## 2018-04-07 DIAGNOSIS — I6523 Occlusion and stenosis of bilateral carotid arteries: Secondary | ICD-10-CM | POA: Diagnosis present

## 2018-04-07 DIAGNOSIS — I5032 Chronic diastolic (congestive) heart failure: Secondary | ICD-10-CM | POA: Diagnosis present

## 2018-04-07 DIAGNOSIS — M1990 Unspecified osteoarthritis, unspecified site: Secondary | ICD-10-CM | POA: Diagnosis present

## 2018-04-07 DIAGNOSIS — I639 Cerebral infarction, unspecified: Secondary | ICD-10-CM | POA: Diagnosis not present

## 2018-04-07 DIAGNOSIS — N400 Enlarged prostate without lower urinary tract symptoms: Secondary | ICD-10-CM | POA: Diagnosis not present

## 2018-04-07 DIAGNOSIS — I447 Left bundle-branch block, unspecified: Secondary | ICD-10-CM

## 2018-04-07 DIAGNOSIS — G319 Degenerative disease of nervous system, unspecified: Secondary | ICD-10-CM | POA: Diagnosis present

## 2018-04-07 DIAGNOSIS — F0280 Dementia in other diseases classified elsewhere without behavioral disturbance: Secondary | ICD-10-CM | POA: Diagnosis present

## 2018-04-07 DIAGNOSIS — R270 Ataxia, unspecified: Secondary | ICD-10-CM | POA: Diagnosis present

## 2018-04-07 DIAGNOSIS — E538 Deficiency of other specified B group vitamins: Secondary | ICD-10-CM | POA: Diagnosis present

## 2018-04-07 DIAGNOSIS — E785 Hyperlipidemia, unspecified: Secondary | ICD-10-CM | POA: Diagnosis present

## 2018-04-07 DIAGNOSIS — I10 Essential (primary) hypertension: Secondary | ICD-10-CM | POA: Diagnosis not present

## 2018-04-07 DIAGNOSIS — G309 Alzheimer's disease, unspecified: Secondary | ICD-10-CM | POA: Diagnosis not present

## 2018-04-07 DIAGNOSIS — N401 Enlarged prostate with lower urinary tract symptoms: Secondary | ICD-10-CM | POA: Diagnosis present

## 2018-04-07 DIAGNOSIS — G9341 Metabolic encephalopathy: Secondary | ICD-10-CM | POA: Diagnosis present

## 2018-04-07 DIAGNOSIS — M47816 Spondylosis without myelopathy or radiculopathy, lumbar region: Secondary | ICD-10-CM | POA: Diagnosis present

## 2018-04-07 DIAGNOSIS — I130 Hypertensive heart and chronic kidney disease with heart failure and stage 1 through stage 4 chronic kidney disease, or unspecified chronic kidney disease: Secondary | ICD-10-CM | POA: Diagnosis present

## 2018-04-07 DIAGNOSIS — M79604 Pain in right leg: Secondary | ICD-10-CM | POA: Diagnosis present

## 2018-04-07 DIAGNOSIS — M5136 Other intervertebral disc degeneration, lumbar region: Secondary | ICD-10-CM | POA: Diagnosis present

## 2018-04-07 DIAGNOSIS — G20 Parkinson's disease: Secondary | ICD-10-CM | POA: Diagnosis present

## 2018-04-07 DIAGNOSIS — M48061 Spinal stenosis, lumbar region without neurogenic claudication: Secondary | ICD-10-CM | POA: Diagnosis present

## 2018-04-07 LAB — HEMOGLOBIN A1C
Hgb A1c MFr Bld: 5.3 % (ref 4.8–5.6)
MEAN PLASMA GLUCOSE: 105.41 mg/dL

## 2018-04-07 LAB — CBC
HCT: 38.5 % — ABNORMAL LOW (ref 39.0–52.0)
Hemoglobin: 13.7 g/dL (ref 13.0–17.0)
MCH: 32.5 pg (ref 26.0–34.0)
MCHC: 35.6 g/dL (ref 30.0–36.0)
MCV: 91.4 fL (ref 80.0–100.0)
NRBC: 0 % (ref 0.0–0.2)
Platelets: 133 10*3/uL — ABNORMAL LOW (ref 150–400)
RBC: 4.21 MIL/uL — AB (ref 4.22–5.81)
RDW: 13.5 % (ref 11.5–15.5)
WBC: 9.4 10*3/uL (ref 4.0–10.5)

## 2018-04-07 LAB — COMPREHENSIVE METABOLIC PANEL
ALT: 19 U/L (ref 0–44)
ANION GAP: 10 (ref 5–15)
AST: 21 U/L (ref 15–41)
Albumin: 3.5 g/dL (ref 3.5–5.0)
Alkaline Phosphatase: 63 U/L (ref 38–126)
BUN: 19 mg/dL (ref 8–23)
CHLORIDE: 107 mmol/L (ref 98–111)
CO2: 24 mmol/L (ref 22–32)
CREATININE: 1.26 mg/dL — AB (ref 0.61–1.24)
Calcium: 9.3 mg/dL (ref 8.9–10.3)
GFR, EST NON AFRICAN AMERICAN: 52 mL/min — AB (ref >60)
Glucose, Bld: 119 mg/dL — ABNORMAL HIGH (ref 70–99)
Potassium: 4 mmol/L (ref 3.5–5.1)
Sodium: 141 mmol/L (ref 135–145)
Total Bilirubin: 1.1 mg/dL (ref 0.3–1.2)
Total Protein: 6.2 g/dL — ABNORMAL LOW (ref 6.5–8.1)

## 2018-04-07 LAB — INFLUENZA PANEL BY PCR (TYPE A & B)
Influenza A By PCR: NEGATIVE
Influenza B By PCR: NEGATIVE

## 2018-04-07 LAB — TSH: TSH: 5.441 u[IU]/mL — AB (ref 0.350–4.500)

## 2018-04-07 LAB — PHOSPHORUS: PHOSPHORUS: 2.8 mg/dL (ref 2.5–4.6)

## 2018-04-07 LAB — ECHOCARDIOGRAM COMPLETE
HEIGHTINCHES: 69 in
Weight: 2800 oz

## 2018-04-07 LAB — TROPONIN I
Troponin I: <0.03 ng/mL (ref <0.03)
Troponin I: <0.03 ng/mL (ref <0.03)

## 2018-04-07 LAB — MAGNESIUM: MAGNESIUM: 2.2 mg/dL (ref 1.7–2.4)

## 2018-04-07 LAB — PREALBUMIN: Prealbumin: 22 mg/dL (ref 18–38)

## 2018-04-07 MED ORDER — TRAZODONE HCL 50 MG PO TABS
25.0000 mg | ORAL_TABLET | Freq: Every day | ORAL | Status: DC
  Administered 2018-04-07: 25 mg via ORAL
  Filled 2018-04-07: qty 1

## 2018-04-07 MED ORDER — CITALOPRAM HYDROBROMIDE 20 MG PO TABS
20.0000 mg | ORAL_TABLET | Freq: Every day | ORAL | Status: DC
  Administered 2018-04-07: 20 mg via ORAL
  Filled 2018-04-07: qty 1

## 2018-04-07 MED ORDER — ENSURE ENLIVE PO LIQD
237.0000 mL | Freq: Two times a day (BID) | ORAL | Status: DC
  Administered 2018-04-08 (×2): 237 mL via ORAL

## 2018-04-07 MED ORDER — LORAZEPAM 1 MG PO TABS: 1.0000 mg | ORAL_TABLET | Freq: Two times a day (BID) | ORAL | Status: DC | PRN

## 2018-04-07 MED ORDER — CLONIDINE HCL 0.1 MG PO TABS
0.3000 mg | ORAL_TABLET | Freq: Two times a day (BID) | ORAL | Status: DC
  Administered 2018-04-07 – 2018-04-08 (×4): 0.3 mg via ORAL
  Filled 2018-04-07 (×4): qty 3

## 2018-04-07 MED ORDER — MORPHINE SULFATE (PF) 2 MG/ML IV SOLN: 1.0000 mg | INTRAVENOUS | Status: DC | PRN

## 2018-04-07 MED ORDER — SODIUM CHLORIDE 0.9 % IV SOLN
INTRAVENOUS | Status: DC
  Administered 2018-04-07: 08:00:00 via INTRAVENOUS

## 2018-04-07 MED ORDER — PANTOPRAZOLE SODIUM 40 MG PO TBEC
40.0000 mg | DELAYED_RELEASE_TABLET | ORAL | Status: DC
  Administered 2018-04-08: 40 mg via ORAL
  Filled 2018-04-07: qty 1

## 2018-04-07 MED ORDER — ACETAMINOPHEN 650 MG RE SUPP: 650.0000 mg | RECTAL | Status: DC | PRN

## 2018-04-07 MED ORDER — GI COCKTAIL ~~LOC~~
30.0000 mL | Freq: Once | ORAL | Status: AC
  Administered 2018-04-07: 30 mL via ORAL
  Filled 2018-04-07: qty 30

## 2018-04-07 MED ORDER — ACETAMINOPHEN 325 MG PO TABS
650.0000 mg | ORAL_TABLET | Freq: Four times a day (QID) | ORAL | Status: DC | PRN
  Administered 2018-04-07: 650 mg via ORAL
  Filled 2018-04-07: qty 2

## 2018-04-07 MED ORDER — SODIUM CHLORIDE 0.9 % IV SOLN
INTRAVENOUS | Status: AC
  Administered 2018-04-07: 02:00:00 via INTRAVENOUS

## 2018-04-07 MED ORDER — ENOXAPARIN SODIUM 40 MG/0.4ML ~~LOC~~ SOLN
40.0000 mg | SUBCUTANEOUS | Status: DC
  Administered 2018-04-07: 40 mg via SUBCUTANEOUS
  Filled 2018-04-07 (×3): qty 0.4

## 2018-04-07 MED ORDER — MORPHINE SULFATE (PF) 2 MG/ML IV SOLN
1.0000 mg | Freq: Once | INTRAVENOUS | Status: AC
  Administered 2018-04-07: 2 mg via INTRAVENOUS
  Filled 2018-04-07: qty 1

## 2018-04-07 MED ORDER — ACETAMINOPHEN 325 MG PO TABS
650.0000 mg | ORAL_TABLET | ORAL | Status: DC | PRN
  Administered 2018-04-07: 650 mg via ORAL
  Filled 2018-04-07: qty 2

## 2018-04-07 MED ORDER — STROKE: EARLY STAGES OF RECOVERY BOOK: Freq: Once | Status: DC

## 2018-04-07 MED ORDER — ONDANSETRON HCL 4 MG/2ML IJ SOLN: 4.0000 mg | Freq: Four times a day (QID) | INTRAMUSCULAR | Status: DC | PRN

## 2018-04-07 MED ORDER — HYDROCODONE-ACETAMINOPHEN 5-325 MG PO TABS: 1.0000 | ORAL_TABLET | ORAL | Status: DC | PRN

## 2018-04-07 MED ORDER — ACETAMINOPHEN 650 MG RE SUPP: 650.0000 mg | Freq: Four times a day (QID) | RECTAL | Status: DC | PRN

## 2018-04-07 MED ORDER — IRBESARTAN 300 MG PO TABS
300.0000 mg | ORAL_TABLET | Freq: Every day | ORAL | Status: DC
  Administered 2018-04-07: 300 mg via ORAL
  Filled 2018-04-07: qty 1

## 2018-04-07 MED ORDER — ONDANSETRON HCL 4 MG PO TABS: 4.0000 mg | ORAL_TABLET | Freq: Four times a day (QID) | ORAL | Status: DC | PRN

## 2018-04-07 MED ORDER — ACETAMINOPHEN 160 MG/5ML PO SOLN: 650.0000 mg | ORAL | Status: DC | PRN

## 2018-04-07 MED ORDER — SENNOSIDES-DOCUSATE SODIUM 8.6-50 MG PO TABS
1.0000 | ORAL_TABLET | Freq: Every evening | ORAL | Status: DC | PRN
  Administered 2018-04-07: 1 via ORAL
  Filled 2018-04-07: qty 1

## 2018-04-07 MED ORDER — ASPIRIN EC 81 MG PO TBEC
81.0000 mg | DELAYED_RELEASE_TABLET | Freq: Every day | ORAL | Status: DC
  Administered 2018-04-08: 81 mg via ORAL
  Filled 2018-04-07 (×2): qty 1

## 2018-04-07 MED ORDER — CARVEDILOL 6.25 MG PO TABS
6.2500 mg | ORAL_TABLET | Freq: Two times a day (BID) | ORAL | Status: DC
  Administered 2018-04-07 – 2018-04-08 (×3): 6.25 mg via ORAL
  Filled 2018-04-07 (×4): qty 1

## 2018-04-07 MED ORDER — HYDRALAZINE HCL 25 MG PO TABS
25.0000 mg | ORAL_TABLET | Freq: Three times a day (TID) | ORAL | Status: DC
  Administered 2018-04-07 – 2018-04-08 (×3): 25 mg via ORAL
  Filled 2018-04-07 (×4): qty 1

## 2018-04-07 NOTE — Evaluation (Signed)
Clinical/Bedside Swallow Evaluation Patient Details  Name: Darryl [name] MRN: [id number] Date of Birth: [birth year]  Today's Date: 04/07/2018 Time: SLP Start Time (ACUTE ONLY): 5366 SLP Stop Time (ACUTE ONLY): 1505 SLP Time Calculation (min) (ACUTE ONLY): 50 min  Past Medical History:  Past Medical History:  Diagnosis Date  . Arthritis   . BPH (benign prostatic hypertrophy) with urinary retention   . Chronic diastolic CHF (congestive heart failure) ([place])   . Colon polyp   . Coronary atherosclerosis of native coronary artery    a. s/p CABG 11/2003. b. s/p DES to radial-OM and SVG-RCA 11/05. c. s/p BMS to LCx 11/2008.  . Dilated aortic root ([place]) 05/29/2014   31mm by echo 04/2017  . Diverticulosis   . Dyslipidemia    statin intolerant  . Elevated fasting glucose   . Gastritis    a. by EGD 04/2004.  . H/O urinary retention    post surgical procedures; has foley cath  . Headache(784.0)   . Hiatal hernia   . HOH (hard of hearing)    slight hoh, rt>l  . HTN (hypertension)   . Hypertensive heart disease   . Insomnia   . Intracranial hemorrhage ([place])    a. small pontine [place] 2003.  [name] LBBB (left bundle branch block)   . Lumbar spondylolysis    multilevel DJD with mild spinal stenosis L3-L5  . Migraines   . Parkinson's disease ([place])   . Peptic ulcer disease    a. prior hx of bleeding ulcers, including 12/2003 in setting of aspirin use.  . Renal insufficiency    renal duplex scan in 2012 showed no renal artery stenosis  . Sleep disturbance   . Stroke [facility])    no clinical manifestations - seen on neuroimaging  . Thrombocytopenia ([place])   . Tremor   . UTI (urinary tract infection)   . Wears glasses    Past Surgical History:  Past Surgical History:  Procedure Laterality Date  . APPENDECTOMY  60   1963  . CATARACT EXTRACTION, BILATERAL    . CHOLECYSTECTOMY  1998  . COLON SURGERY  03/14/2008  . COLONOSCOPY  10/2001  . CORONARY ANGIOPLASTY  6/10,10/05   Dr. [name]  cardiologist: last office visit 11/07/14  . CORONARY ARTERY BYPASS GRAFT  2005  . CYSTOSCOPY WITH LITHOLAPAXY N/A 11/19/2014   Procedure: CYSTOSCOPY WITH LITHOLAPAXY;  Surgeon: [name], MD;  Location: [facility];  Service: Urology;  Laterality: N/A;  . ESOPHAGOGASTRODUODENOSCOPY     10/2001, 12/2003, 04/2004  . LUMBAR LAMINECTOMY/DECOMPRESSION MICRODISCECTOMY N/A 05/03/2013   Procedure: LUMBAR LAMINECTOMY/DCOMPRESSION MICRODISCECTOMY RIGHT LUMBAR FOUR FIVE;  Surgeon: [name], MD;  Location: [facility];  Service: Neurosurgery;  Laterality: N/A;  . SHOULDER ARTHROSCOPY Left 9/10  . stones     hx  . TRANSURETHRAL RESECTION OF PROSTATE N/A 11/19/2014   Procedure: TRANSURETHRAL RESECTION OF THE PROSTATE WITH GYRUS INSTRUMENTS;  Surgeon: [name], MD;  Location[facility];  Service: Urology;  Laterality: N/A;  . WISDOM TOOTH EXTRACTION     HPI:  83 yo male adm to [facility] with acute encephalopathy, gait instability- concern for possible CVA.  Pt underwent MRI that was negative for acute stroke.  He has h/o Parkinson's disease- diagnosed x4 years, GERD, chest pain, CAD, CHF, right leg pain.  CXR negative.  Pt had a prior swallow evaluation in May 2019 when in hospital for asp pna and was recommended to have dys2/nectar diet - Wife present reports  pt did not consume nectar liquids at home and has been consuming regular/thin diet with good tolerance.  He has lost mild amount of weight over the last 2 months but has not had recurrent pulmonary issues.  Swallow evaluation ordered.    Assessment / Plan / Recommendation Clinical Impression  Patient with intact ability to feed himself and self regulates well at slow rate.  NO overt indication of severe dysphagia/aspiration.  Subtle cough x2 observed with consumption of ice chip and icecream - otherwise none.  Pt observed self feeding [place] sandwich, orange sherbert, water and orange juice.  He did NOT pass 3 ounce water  test as he did not consume liquid sequentially - requiring quick frequent rest breaks.  Pt does have h/o SILENT aspiration of thin liquids on prior MBS May 2019 when he was acutely ill with an aspiration pna.  Suspect he may continue with subtle silent aspiration but wife reports pt did NOT consume nectar thickened liquids at home due to displeasure with them.  Pt has not had recurrent pna nor severe weight loss in this time frame, thus is appears to be managing his chronic dysphagia.  Showed pt's wife prior MBS and reviewed aspiration pna risk factors including condition of dentition, ambulation status, reliance on others for feeding, number of medications pt is taking, strength of cough/voice, etc.  Both pt and wife desire to continue regular/thin diet accepting risks.  SLP reviewed heimlich manuever with pt/wife and reviewed consistencies pt may be tolerate if aspirated.  Advised pt consume WATER with meals and other drinks between meals.  Avoiding mixed consistencies is recommended also. Will follow up to establish RMST for use at home to help laryngeal closure/elevation that was noted to be impaired on prior MBS and thus aid airway protection.  Messaged MD with recommendations.  SLP Visit Diagnosis: Dysphagia, pharyngoesophageal phase (R13.14)    Aspiration Risk  Mild aspiration risk    Diet Recommendation Thin liquid;Regular(no mixed consistencies)   Liquid Administration via: Cup;No straw Medication Administration: Whole meds with puree Supervision: Patient able to self feed Compensations: Minimize environmental distractions;Slow rate;Small sips/bites Postural Changes: Remain upright for at least 30 minutes after po intake;Seated upright at 90 degrees    Other  Recommendations Oral Care Recommendations: Oral care BID   Follow up Recommendations   TBD     Frequency and Duration min 1 x/week  1 week       Prognosis Prognosis for Safe Diet Advancement: Good      Swallow Study    General Date of Onset: 04/07/18 HPI: 83 yo male adm to [facility] with acute encephalopathy, gait instability- concern for possible CVA.  Pt underwent MRI that was negative for acute stroke.  He has h/o Parkinson's disease- diagnosed x4 years, GERD, chest pain, CAD, CHF, right leg pain.  CXR negative.  Pt had a prior swallow evaluation in May 2019 when in hospital for asp pna and was recommended to have dys2/nectar diet - Wife present reports pt did not consume nectar liquids at home and has been consuming regular/thin diet with good tolerance.  He has lost mild amount of weight over the last 2 months but has not had recurrent pulmonary issues.  Swallow evaluation ordered.  Type of Study: Bedside Swallow Evaluation Previous Swallow Assessment: MBS 11/16/2017 see notes - NO CHIN TUCK, it worsened airway protection Diet Prior to this Study: Regular;Thin liquids Temperature Spikes Noted: No Respiratory Status: Room air History of Recent Intubation: No Behavior/Cognition: Alert;Cooperative;Pleasant mood Oral Cavity  Assessment: Dry;Dried secretions(on soft palate) Oral Care Completed by SLP: Yes Oral Cavity - Dentition: Adequate natural dentition;Other (Comment)(some poor dentition) Vision: Functional for self-feeding Self-Feeding Abilities: Able to feed self Patient Positioning: Upright in bed Baseline Vocal Quality: Low vocal intensity Volitional Cough: Strong Volitional Swallow: Able to elicit    Oral/Motor/Sensory Function Overall Oral Motor/Sensory Function: Generalized oral weakness(no focal CN deficits, ? minimal right facial asymmetry, pt able to seal lips on spoon and cup)   Ice Chips Ice chips: Within functional limits   Thin Liquid Thin Liquid: Impaired(water, orange juice) Presentation: Self Fed;Cup Oral Phase Functional Implications: Right anterior spillage Other Comments: minimal right anterior spillage of water    Nectar Thick Nectar Thick Liquid: Not tested   Honey Thick Honey Thick  Liquid: Not tested   Puree Puree: Within functional limits Presentation: Self Fed;Spoon Other Comments: cough x1 after icecream bolus - of approximately 10 boluses   Solid     Solid: Within functional limits Presentation: [place], Marguerite Olea 04/07/2018,3:17 PM  Luanna Salk, [facility] SLP [place] Pager [phone] Office [phone]

## 2018-04-07 NOTE — Care Management Note (Signed)
Case Management Note  Patient Details  Name: Darryl [name] MRN: [id number] Date of Birth: June 20, 1935  Subjective/Objective:Active w/[name]-HHPT/HHST rep Dian Situ following. Received call from [place] who received community referral for home hospice services-I have spoken to spouse-currently will hold off on [facility] referral while ongoing diagnostics,& treatment is provided.spouse has a dtr who is alos involved w/decision making process-she will be here tomorrow.Palliative cons-await recc.                   Action/Plan:d/c plan likely SNF/hospice @ home.   Expected Discharge Date:                  Expected Discharge Plan:     In-House Referral:     Discharge planning Services  CM Consult  Post Acute Care Choice:  Home Health(Active w/[name] HHPT/ST) Choice offered to:     DME Arranged:    DME Agency:     HH Arranged:    [place] Agency:     Status of Service:  In process, will continue to follow  If discussed at Long Length of Stay Meetings, dates discussed:    Additional Comments:  Dessa Phi, RN 04/07/2018, 1:54 PM

## 2018-04-07 NOTE — Progress Notes (Addendum)
Pt arrived to unit with manual BP 186/100. Admitting MD paged. Instructions given to give pt scheduled coreg and clonidine and recheck. Verbal orders given not to page unless SBP greater than 190.   BP rechecked at 0312, found to be 169/84. Will continue to monitor BP closely.

## 2018-04-07 NOTE — Telephone Encounter (Signed)
Patient's wife is calling in stating that she needs to cancel his appt for tomorrow which I have done because the patient is in the hospital with possible stroke. She wanted to let Dr.Tat know. If you need to call her back it's [phone]. Thanks!

## 2018-04-07 NOTE — Telephone Encounter (Signed)
Dr Tat is aware. 

## 2018-04-07 NOTE — ED Notes (Signed)
ED TO INPATIENT HANDOFF REPORT  Name/Age/Gender Darryl [name] 83 y.o. male  Code Status Code Status History    Date Active Date Inactive Code Status Order ID Comments User Context   11/15/2017 0232 11/17/2017 1359 Full Code [id number]  Ivor Costa, MD ED   11/19/2014 1306 11/20/2014 1630 Full Code [id number]  [name], MD Inpatient   10/07/2014 1558 10/13/2014 1641 Full Code [id number]  Caren Griffins, MD ED   10/03/2014 2024 10/05/2014 1917 Full Code [id number]  Theressa Millard, MD Inpatient   05/03/2013 1212 05/04/2013 1317 Full Code [id number]  [name], MD Inpatient    Advance Directive Documentation     Most Recent Value  Type of Advance Directive  Healthcare Power of Attorney  Pre-existing out of facility DNR order (yellow form or pink MOST form)  -  "MOST" Form in Place?  -      Home/SNF/Other Home  Chief Complaint leg pain  Level of Care/Admitting Diagnosis ED Disposition    ED Disposition Condition [place]: [facility] [100102]  Level of Care: Telemetry [5]  Admit to tele based on following criteria: Monitor for Ischemic changes  Diagnosis: Acute encephalopathy [470962]  Admitting Physician: Toy Baker [[id number]]  Attending Physician: Toy Baker [[id number]]  PT Class (Do Not Modify): Observation [104]  PT Acc Code (Do Not Modify): Observation [10022]       Medical History Past Medical History:  Diagnosis Date  . Arthritis   . BPH (benign prostatic hypertrophy) with urinary retention   . Chronic diastolic CHF (congestive heart failure) ([place])   . Colon polyp   . Coronary atherosclerosis of native coronary artery    a. s/p CABG 11/2003. b. s/p DES to radial-OM and SVG-RCA 11/05. c. s/p BMS to LCx 11/2008.  . Dilated aortic root ([place]) 05/29/2014   86m by echo 04/2017  . Diverticulosis   . Dyslipidemia    statin intolerant  . Elevated fasting glucose   . Gastritis    a. by EGD 04/2004.  . H/O urinary  retention    post surgical procedures; has foley cath  . Headache(784.0)   . Hiatal hernia   . HOH (hard of hearing)    slight hoh, rt>l  . HTN (hypertension)   . Hypertensive heart disease   . Insomnia   . [place]acranial hemorrhage ([place]    a. small pontine [id number].  .Marland KitchenLBBB (left bundle branch block)   . Lumbar spondylolysis    multilevel DJD with mild spinal stenosis L3-L5  . Migraines   . Parkinson's disease ([place]   . Peptic ulcer disease    a. prior hx of bleeding ulcers, including 12/2003 in setting of aspirin use.  . Renal insufficiency    renal duplex scan in 2012 showed no renal artery stenosis  . Sleep disturbance   . Stroke ([facility]    no clinical manifestations - seen on neuroimaging  . [place]rombocytopenia ([place]   . Tremor   . UTI (urinary tract infection)   . Wears glasses     Allergies Allergies  Allergen Reactions  . Ambien [Zolpidem Tartrate] Other (See Comments)    hallucinations  . Aspirin Other (See Comments)    ulcers  . Celebrex [Celecoxib] Other (See Comments)    Due to ulcer.  . Nsaids Other (See Comments)    Due to ulcer.  . Toprol Xl [Metoprolol Tartrate] Swelling  . Altace [Ramipril]     UNKNOWN  .  Amlodipine     Feet swell  . Levothyroxine Other (See Comments)    "made him feel bad"  . Biaxin [Clarithromycin] Nausea Only  . Tetracyclines & Related Nausea Only    IV Location/Drains/Wounds Patient Lines/Drains/Airways Status   Active Line/Drains/Airways    Name:   Placement date:   Placement time:   Site:   Days:   Peripheral IV 04/06/18   04/06/18    1851    -   1   Peripheral IV 04/07/18 Right Antecubital   04/07/18    0013    Antecubital   less than 1   Incision 05/03/13 Back Other (Comment)   05/03/13    0937     1800   Incision (Closed) 11/19/14 Penis Other (Comment)   11/19/14    0818     1235          Labs/Imaging Results for orders placed or performed during the hospital encounter of 04/06/18 (from the past 48 hour(s))   Urinalysis, Routine w reflex microscopic     Status: Abnormal   Collection Time: 04/06/18  3:49 PM  Result Value Ref Range   Color, Urine STRAW (A) YELLOW   APPearance CLEAR CLEAR   Specific Gravity, Urine 1.010 1.005 - 1.030   pH 7.0 5.0 - 8.0   Glucose, UA NEGATIVE NEGATIVE mg/dL   Hgb urine dipstick NEGATIVE NEGATIVE   Bilirubin Urine NEGATIVE NEGATIVE   Ketones, ur NEGATIVE NEGATIVE mg/dL   Protein, ur NEGATIVE NEGATIVE mg/dL   Nitrite NEGATIVE NEGATIVE   Leukocytes, UA NEGATIVE NEGATIVE    Comment: Performed at [facility], [place] [address] [place], [place] [address]  CBC     Status: Abnormal   Collection Time: 04/06/18  4:00 PM  Result Value Ref Range   WBC 8.5 4.0 - 10.5 K/uL   RBC 4.58 4.22 - 5.81 MIL/uL   Hemoglobin 14.5 13.0 - 17.0 g/dL   HCT 41.2 39.0 - 52.0 %   MCV 90.0 80.0 - 100.0 fL   MCH 31.7 26.0 - 34.0 pg   MCHC 35.2 30.0 - 36.0 g/dL   RDW 13.4 11.5 - 15.5 %   Platelets 132 (L) 150 - 400 K/uL    Comment: REPEATED TO VERIFY SPECIMEN CHECKED FOR CLOTS    nRBC 0.0 0.0 - 0.2 %    Comment: Performed at [facility], [place] [address] [place], [place] [address]  Basic metabolic panel     Status: Abnormal   Collection Time: 04/06/18  4:00 PM  Result Value Ref Range   Sodium 143 135 - 145 mmol/L   Potassium 3.7 3.5 - 5.1 mmol/L   Chloride 108 98 - 111 mmol/L   CO2 28 22 - 32 mmol/L   Glucose, Bld 120 (H) 70 - 99 mg/dL   BUN 18 8 - 23 mg/dL   Creatinine, Ser 1.26 (H) 0.61 - 1.24 mg/dL   Calcium 9.5 8.9 - 10.3 mg/dL   GFR calc non Af Amer 52 (L) >60 mL/min   GFR calc Af Amer >60 >60 mL/min    Comment: (NOTE) The eGFR has been calculated using the CKD EPI equation. This calculation has not been validated in all clinical situations. eGFR's persistently <60 mL/min signify possible Chronic Kidney Disease.    Anion gap 7 5 - 15    Comment: Performed at [facility], [place] [address], [place], [place]  [address]  Troponin I     Status: None  Collection Time: 04/06/18 11:51 PM  Result Value Ref Range   Troponin I <0.03 <0.03 ng/mL    Comment: Performed at [facility], [place] [address], [place], [place] [address]   Dg Chest 2 View  Result Date: 04/06/2018 CLINICAL DATA:  Weakness EXAM: CHEST - 2 VIEW COMPARISON:  03/20/2018 chest radiograph. FINDINGS: Intact sternotomy wires. Stable cardiomediastinal silhouette with normal heart size. No pneumothorax. No pleural effusion. Lungs appear clear, with no acute consolidative airspace disease and no pulmonary edema. IMPRESSION: No active cardiopulmonary disease. Electronically Signed   By: Ilona Sorrel M.D.   On: 04/06/2018 18:23   Dg Lumbar Spine Complete  Result Date: 04/06/2018 CLINICAL DATA:  Weakness. No reported injury. EXAM: LUMBAR SPINE - COMPLETE 4+ VIEW COMPARISON:  09/08/2010 CT abdomen/pelvis FINDINGS: This report assumes 5 non rib-bearing lumbar vertebrae. Lumbar vertebral body heights are preserved, with no fracture. Moderate multilevel lumbar degenerative disc disease, most prominent at L1-2. No spondylolisthesis. Bilateral L5 pars defects, better seen on 2012 CT. Mild lower lumbar facet arthropathy. No aggressive appearing focal osseous lesions. IMPRESSION: 1. No lumbar spine fracture or spondylolisthesis. 2. Moderate multilevel lumbar degenerative disc disease, most prominent at L1-2. 3. Bilateral L5 pars defects, better seen on 2012 CT. 4. Mild lower lumbar facet arthropathy. Electronically Signed   By: Ilona Sorrel M.D.   On: 04/06/2018 18:27   Ct Head Wo Contrast  Result Date: 04/06/2018 CLINICAL DATA:  Altered mental status EXAM: CT HEAD WITHOUT CONTRAST TECHNIQUE: Contiguous axial images were obtained from the base of the skull through the vertex without intravenous contrast. COMPARISON:  03/20/2018 FINDINGS: Brain: Mild atrophic changes and chronic white matter ischemic changes are again seen and stable. No findings to  suggest acute hemorrhage, acute infarction or space-occupying mass lesion are seen. Vascular: No hyperdense vessel or unexpected calcification. Skull: Normal. Negative for fracture or focal lesion. Sinuses/Orbits: No acute finding. Other: None. IMPRESSION: Chronic atrophic and ischemic changes without acute abnormality. Electronically Signed   By: Inez Catalina M.D.   On: 04/06/2018 21:37   EKG Interpretation  Date/Time:  Wednesday April 06 2018 15:36:16 EDT Ventricular Rate:  56 PR Interval:    QRS Duration: 152 QT Interval:  490 QTC Calculation: 473 R Axis:   -18 Text Interpretation:  Sinus rhythm Left bundle branch block No significant change since last tracing Confirmed by Dorie Rank [id number]) on 04/06/2018 8:20:52 PM   Pending Labs Unresulted Labs (From admission, onward)    Start     Ordered   04/06/18 2304  Influenza panel by PCR (type A & B)  Once,   R     04/06/18 2303   04/06/18 2242  Troponin I (q 6hr x 3)  Now then every 6 hours,   R     04/06/18 2241   04/06/18 1801  Urine Culture  Once,   STAT     04/06/18 1800   Signed and Held  Magnesium  Tomorrow morning,   R    Comments:  Call MD if <1.5    Signed and Held   Signed and Held  Phosphorus  Tomorrow morning,   R     Signed and Held   Signed and Held  TSH  Once,   R    Comments:  Cancel if already done within 1 month and notify MD    Signed and Held   Signed and Held  Comprehensive metabolic panel  Once,   R    Comments:  Cal MD  for K<3.5 or >5.0    Signed and Held   Signed and Held  CBC  Once,   R    Comments:  Call for hg <8.0    Signed and Held   Signed and Held  Hemoglobin A1c  Tomorrow morning,   R    Comments:  Cancel if has been done within past month and notify MD    Signed and Held   Signed and Held  Prealbumin  Tomorrow morning,   R     Signed and Held          Vitals/Pain Today's Vitals   04/06/18 2115 04/06/18 2142 04/06/18 2245 04/06/18 2330  BP: (!) 164/91 (!) 164/91 (!) 189/100 (!)  144/79  Pulse: 66 66 69 78  Resp: 18 (!) '21 19 16  '$ Temp:      TempSrc:      SpO2: 97% 98% 98% 96%  Weight:      Height:      PainSc:        Isolation Precautions No active isolations  Medications Medications  morphine 2 MG/ML injection 1 mg (has no administration in time range)  nitroGLYCERIN (NITROSTAT) SL tablet 0.4 mg (0.4 mg Sublingual Given 04/06/18 2326)  gi cocktail (Maalox,Lidocaine,Donnatal) (has no administration in time range)  morphine 2 MG/ML injection 1-2 mg (has no administration in time range)  acetaminophen (TYLENOL) tablet 650 mg (650 mg Oral Given 04/06/18 1548)  hydrALAZINE (APRESOLINE) tablet 50 mg (50 mg Oral Given 04/06/18 1610)  hydrALAZINE (APRESOLINE) tablet 50 mg (50 mg Oral Given 04/06/18 2039)    Mobility walks with person assist

## 2018-04-07 NOTE — Progress Notes (Signed)
[facility] PROGRESS NOTE    Progress Note  Darryl [name]  TIW:[id number] DOB: [birth year] DOA: 04/06/2018 PCP: [name], MD     Brief Narrative:   Darryl [name] is an 83 y.o. male past medical history of left bundle branch block, hypertension, chronic diastolic heart failure number cytopenia presents with increased right leg pain is found to be combative by EMS  Assessment/Plan:   Acute encephalopathy/Unstable gait Of unclear etiology, awaiting physical therapy. MRI of the brain is pending. Influenza PCR negative.HgbA1c, fasting lipid panel  MRA of the brain without contrast  pending PT, OT, Speech consult  Echocardiogram  Carotid dopplers  Prophylactic therapy-Antiplatelet med: Aspirin - dose 325 mg PO daily  risk factor modification  Cardiac Monitoring  Neurochecks q4h  Keep MAP 70  Chest pain: Cardiac biomarkers negative, sinus rhythm with an old left bundle branch block patient denies chest pain or shortness of breath.  Dyslipidemia: Continue statins.    LBBB (left bundle branch block) Stable  Parkinson's disease ([place]) Current home medication get physical therapy evaluation.  Benign prostatic hyperplasia Continue current medications.  CAD (coronary artery disease), native coronary artery Asymptomatic denies any chest pain or shortness of breath.  Chronic diastolic heart failure (HCC) Continue to hold diuretics.  CKD (chronic kidney disease) stage 3, GFR 30-59 ml/min (HCC) Baseline.   DVT prophylaxis: lovexno Family Communication:wife Disposition Plan/Barrier to D/C: unable to determine Code Status:     Code Status Orders  (From admission, onward)         Start     Ordered   04/07/18 0202  Do not attempt resuscitation (DNR)  Continuous    Question Answer Comment  In the event of cardiac or respiratory ARREST Do not call a "code blue"   In the event of cardiac or respiratory ARREST Do not perform Intubation, CPR, defibrillation  or ACLS   In the event of cardiac or respiratory ARREST Use medication by any route, position, wound care, and other measures to relive pain and suffering. May use oxygen, suction and manual treatment of airway obstruction as needed for comfort.      04/07/18 0201        Code Status History    Date Active Date Inactive Code Status Order ID Comments User Context   11/15/2017 0232 11/17/2017 1359 Full Code [id number]  Ivor Costa, MD ED   11/19/2014 1306 11/20/2014 1630 Full Code [id number]  [name], MD Inpatient   10/07/2014 1558 10/13/2014 1641 Full Code [id number]  Caren Griffins, MD ED   10/03/2014 2024 10/05/2014 1917 Full Code [id number]  Theressa Millard, MD Inpatient   05/03/2013 1212 05/04/2013 1317 Full Code [id number]  [name], MD Inpatient    Advance Directive Documentation     Most Recent Value  Type of Advance Directive  Healthcare Power of Attorney, Living will  Pre-existing out of facility DNR order (yellow form or pink MOST form)  -  "MOST" Form in Place?  -        IV Access:    Peripheral IV   Procedures and diagnostic studies:   Dg Chest 2 View  Result Date: 04/06/2018 CLINICAL DATA:  Weakness EXAM: CHEST - 2 VIEW COMPARISON:  03/20/2018 chest radiograph. FINDINGS: Intact sternotomy wires. Stable cardiomediastinal silhouette with normal heart size. No pneumothorax. No pleural effusion. Lungs appear clear, with no acute consolidative airspace disease and no pulmonary edema. IMPRESSION: No active cardiopulmonary disease. Electronically Signed   By: Janina Mayo.D.  On: 04/06/2018 18:23   Dg Lumbar Spine Complete  Result Date: 04/06/2018 CLINICAL DATA:  Weakness. No reported injury. EXAM: LUMBAR SPINE - COMPLETE 4+ VIEW COMPARISON:  09/08/2010 CT abdomen/pelvis FINDINGS: This report assumes 5 non rib-bearing lumbar vertebrae. Lumbar vertebral body heights are preserved, with no fracture. Moderate multilevel lumbar degenerative disc disease, most prominent at  L1-2. No spondylolisthesis. Bilateral L5 pars defects, better seen on 2012 CT. Mild lower lumbar facet arthropathy. No aggressive appearing focal osseous lesions. IMPRESSION: 1. No lumbar spine fracture or spondylolisthesis. 2. Moderate multilevel lumbar degenerative disc disease, most prominent at L1-2. 3. Bilateral L5 pars defects, better seen on 2012 CT. 4. Mild lower lumbar facet arthropathy. Electronically Signed   By: Ilona Sorrel M.D.   On: 04/06/2018 18:27   Ct Head Wo Contrast  Result Date: 04/06/2018 CLINICAL DATA:  Altered mental status EXAM: CT HEAD WITHOUT CONTRAST TECHNIQUE: Contiguous axial images were obtained from the base of the skull through the vertex without intravenous contrast. COMPARISON:  03/20/2018 FINDINGS: Brain: Mild atrophic changes and chronic white matter ischemic changes are again seen and stable. No findings to suggest acute hemorrhage, acute infarction or space-occupying mass lesion are seen. Vascular: No hyperdense vessel or unexpected calcification. Skull: Normal. Negative for fracture or focal lesion. Sinuses/Orbits: No acute finding. Other: None. IMPRESSION: Chronic atrophic and ischemic changes without acute abnormality. Electronically Signed   By: Inez Catalina M.D.   On: 04/06/2018 21:37     Medical Consultants:    None.  Anti-Infectives:   None  Subjective:    Darryl [name] he relates he continues to have right lower extremity pain  Objective:    Vitals:   04/07/18 0130 04/07/18 0145 04/07/18 0312 04/07/18 0437  BP: (!) 176/103 (!) 186/100 (!) 169/84 (!) 156/95  Pulse: 74  66 64  Resp: 18   18  Temp: 98.7 F (37.1 C)   98.6 F (37 C)  TempSrc: Oral   Oral  SpO2: 96%   96%  Weight:      Height:        Intake/Output Summary (Last 24 hours) at 04/07/2018 0746 Last data filed at 04/07/2018 0555 Gross per 24 hour  Intake 173.67 ml  Output -  Net 173.67 ml   Filed Weights   04/06/18 1356  Weight: 79.4 kg    Exam: General  exam: In no acute distress. Respiratory system: Good air movement and clear to auscultation. Cardiovascular system: S1 & S2 heard, RRR.  Gastrointestinal system: Abdomen is nondistended, soft and nontender.  Central nervous system: Alert and oriented.  Romberg sign positive, finger-to-nose positive. Extremities: No pedal edema. Skin: No rashes, lesions or ulcers Psychiatry: Judgement and insight appear normal. Mood & affect appropriate.    Data Reviewed:    Labs: Basic Metabolic Panel: Recent Labs  Lab 04/06/18 1600 04/07/18 0546  NA 143 141  K 3.7 4.0  CL 108 107  CO2 28 24  GLUCOSE 120* 119*  BUN 18 19  CREATININE 1.26* 1.26*  CALCIUM 9.5 9.3  MG  --  2.2  PHOS  --  2.8   GFR Estimated Creatinine Clearance: 46.8 mL/min (A) (by C-G formula based on SCr of 1.26 mg/dL (H)). Liver Function Tests: Recent Labs  Lab 04/07/18 0546  AST 21  ALT 19  ALKPHOS 63  BILITOT 1.1  PROT 6.2*  ALBUMIN 3.5   No results for input(s): LIPASE, AMYLASE in the last 168 hours. No results for input(s): AMMONIA in the last 168  hours. Coagulation profile No results for input(s): INR, PROTIME in the last 168 hours.  CBC: Recent Labs  Lab 04/06/18 1600 04/07/18 0546  WBC 8.5 9.4  HGB 14.5 13.7  HCT 41.2 38.5*  MCV 90.0 91.4  PLT 132* 133*   Cardiac Enzymes: Recent Labs  Lab 04/06/18 2351 04/07/18 0546  TROPONINI <0.03 <0.03   BNP (last 3 results) No results for input(s): PROBNP in the last 8760 hours. CBG: No results for input(s): GLUCAP in the last 168 hours. D-Dimer: No results for input(s): DDIMER in the last 72 hours. Hgb A1c: No results for input(s): HGBA1C in the last 72 hours. Lipid Profile: No results for input(s): CHOL, HDL, LDLCALC, TRIG, CHOLHDL, LDLDIRECT in the last 72 hours. Thyroid function studies: Recent Labs    04/07/18 0546  TSH 5.441*   Anemia work up: No results for input(s): VITAMINB12, FOLATE, FERRITIN, TIBC, IRON, RETICCTPCT in the last 72  hours. Sepsis Labs: Recent Labs  Lab 04/06/18 1600 04/07/18 0546  WBC 8.5 9.4   Microbiology No results found for this or any previous visit (from the past 240 hour(s)).   Medications:   . aspirin EC  81 mg Oral Daily  . carvedilol  6.25 mg Oral BID  . citalopram  20 mg Oral QHS  . cloNIDine  0.3 mg Oral BID  . hydrALAZINE  25 mg Oral TID  . irbesartan  300 mg Oral QHS  . [START ON 04/08/2018] pantoprazole  40 mg Oral Once per day on Mon Wed Fri  . traZODone  25 mg Oral QHS   Continuous Infusions: . sodium chloride 50 mL/hr at 04/07/18 0555      LOS: 0 days   Charlynne Cousins  [facility] Pager [phone]  *Please refer to [email or web address], password [email or web address] to get updated schedule on who will round on this patient, as hospitalists switch teams weekly. If 7PM-7AM, please contact night-coverage at [email or web address], password [email or web address] for any overnight needs.  04/07/2018, 7:46 AM

## 2018-04-07 NOTE — Progress Notes (Deleted)
Darryl [name] was seen today in the movement disorders clinic for neurologic consultation at the request of [name], MD.  The consultation is for the evaluation of PD.  This patient is accompanied in the office by his spouse who supplements the history.Pt previously under the care of Dr. Janann Colonel and then Dr. Jaynee Eagles.  The records that were made available to me were reviewed.  Patient presented to Dr. Janann Colonel in February, 2015 with right greater than left upper extremity tremor.  Symptoms have been going on for a few years prior to presentation to Dr. Janann Colonel.  Shuffling of the feet was also noted by his wife.  Dr. Janann Colonel felt that it was likely idiopathic Parkinson's disease.  After his first visit with Dr. Janann Colonel, an MRI of the brain was ordered and suggested possible amyloid angiopathy.  He was referred to Dr. Leonie Man for consultation who felt that he did not have amyloid angiopathy, but rather cerebral small vessel disease and cerebral micro-bleeds due to long-standing uncontrolled hypertension.  He followed back up with Dr. Janann Colonel, who did not feel he needed medication for parkinsonism.  He then began to see Dr. Jaynee Eagles in December, 2015.  They opted for no medication until September, 2017 at which time levodopa was recommended.  He called back a few days later with stomach cramping and some decreasing of blood pressure and the medication was discontinued.  Therefore, in January, 2018 Rytary was recommended but he decided not to take that.  He has also been very resistant to in home therapies per records but is currently participating after a stay in the hospital.  Patient was recently in the hospital from May 20 to Nov 17, 2017.  He was profoundly weak and found on the floor without ability to get himself up.  White count was high and it was presumed he had aspiration pneumonia.  He was septic.   Specific Symptoms:  Tremor: Yes.  , but not as much as in the past per wife Family hx of similar:   No. Voice: gotten weaker Sleep: trouble waking up and not being able to get back to sleep  Vivid Dreams:  no (some but more waking up and thinking granddaughter had been there)  Acting out dreams:  No. Wet Pillows: No. Postural symptoms:  Yes.   - gotten better since out of the hospital.    Falls?  Yes.  , had a slide down at the hospital.  Had a fall in 09/2016 (over year ago) in the bathroom and hit head.   Bradykinesia symptoms: shuffling gait, slow movements and difficulty getting out of a chair Loss of smell:  No. Loss of taste:  No. Urinary Incontinence:  No. Difficulty Swallowing:  No. Handwriting, micrographia: Yes.   Trouble with ADL's:  No. - some assist at times per wife  Trouble buttoning clothing: No. Depression:  No. Memory changes:  Yes.   per wife but no per patient; doesn't drive and hasn't for 2.5 years;  Wife does pill box; wife does household finances and cooking Hallucinations:  Yes.   per wife but no per patient (doesn't happen even weekly)  visual distortions: No. N/V:  No. Lightheaded:  No.  Syncope: No. Diplopia:  No. Dyskinesia:  No.  had a CT brain in 09/2017 demonstrating significant WMD and old lacunes in BG and thalamus  04/08/18 update: Patient is seen today in follow-up for Parkinson's disease, accompanied by his wife and his daughter who supplement the history.  Last visit, carbidopa/levodopa 25/100 CR was started slowly.  Records are reviewed since our last visit.  Patient was in the emergency room on March 20, 2018 for progressive agitation.  Patient was given Cipro and sent home with PRN Ativan.  He was admitted on April 06, 2018.  Presented with more trouble walking, blood pressure elevation and agitation.  Work-up was negative for influenza.  PREVIOUS MEDICATIONS: Sinemet; Rytary was previously given but patient never took it  ALLERGIES:   Allergies  Allergen Reactions  . Ambien [Zolpidem Tartrate] Other (See Comments)    hallucinations  .  Aspirin Other (See Comments)    ulcers  . Celebrex [Celecoxib] Other (See Comments)    Due to ulcer.  . Nsaids Other (See Comments)    Due to ulcer.  . Toprol Xl [Metoprolol Tartrate] Swelling  . Altace [Ramipril]     UNKNOWN  . Amlodipine     Feet swell  . Levothyroxine Other (See Comments)    "made him feel bad"  . Biaxin [Clarithromycin] Nausea Only  . Tetracyclines & Related Nausea Only    CURRENT MEDICATIONS:  Facility-Administered Encounter Medications as of 04/08/2018  Medication  .  stroke: mapping our early stages of recovery book  . 0.9 %  sodium chloride infusion  . acetaminophen (TYLENOL) tablet 650 mg   Or  . acetaminophen (TYLENOL) solution 650 mg   Or  . acetaminophen (TYLENOL) suppository 650 mg  . aspirin EC tablet 81 mg  . carvedilol (COREG) tablet 6.25 mg  . citalopram (CELEXA) tablet 20 mg  . cloNIDine (CATAPRES) tablet 0.3 mg  . enoxaparin (LOVENOX) injection 40 mg  . hydrALAZINE (APRESOLINE) tablet 25 mg  . HYDROcodone-acetaminophen (NORCO/VICODIN) 5-325 MG per tablet 1-2 tablet  . irbesartan (AVAPRO) tablet 300 mg  . LORazepam (ATIVAN) tablet 1 mg  . morphine 2 MG/ML injection 1 mg  . nitroGLYCERIN (NITROSTAT) SL tablet 0.4 mg  . ondansetron (ZOFRAN) tablet 4 mg   Or  . ondansetron (ZOFRAN) injection 4 mg  . [START ON 04/08/2018] pantoprazole (PROTONIX) EC tablet 40 mg  . senna-docusate (Senokot-S) tablet 1 tablet  . traZODone (DESYREL) tablet 25 mg   Outpatient Encounter Medications as of 04/08/2018  Medication Sig  . acetaminophen (TYLENOL) 500 MG tablet Take 1,000 mg by mouth every 8 (eight) hours as needed for mild pain or moderate pain.  [name] aspirin EC 81 MG tablet Take 81 mg by mouth. Patient takes 3 times a week  . Carbidopa-Levodopa ER (SINEMET CR) 25-100 MG tablet controlled release Take 1 tablet by mouth 3 (three) times daily.  . carvedilol (COREG) 6.25 MG tablet Take 1 tablet (6.25 mg total) by mouth 2 (two) times daily.  . cephALEXin  (KEFLEX) 250 MG capsule Take 250 mg by mouth at bedtime.   . ciprofloxacin (CIPRO) 500 MG tablet Take 1 tablet (500 mg total) by mouth 2 (two) times daily. One po bid x 7 days (Patient not taking: Reported on 04/06/2018)  . citalopram (CELEXA) 20 MG tablet Take 20 mg by mouth at bedtime.   . cloNIDine (CATAPRES) 0.2 MG tablet Take 1.5 tablets (0.3 mg total) by mouth 2 (two) times daily.  . furosemide (LASIX) 20 MG tablet Take 1 tablet (20 mg total) by mouth daily.  . hydrALAZINE (APRESOLINE) 25 MG tablet Take 1 tablet (25 mg total) by mouth 3 (three) times daily.  . irbesartan (AVAPRO) 300 MG tablet Take 300 mg by mouth at bedtime.   [name] KLOR-CON 10 10 MEQ tablet  Take 1 tablet (10 mEq total) by mouth daily.  [name] LORazepam (ATIVAN) 1 MG tablet Take 1 tablet (1 mg total) by mouth 2 (two) times daily as needed for anxiety.  . Maltodextrin-Xanthan Gum ([place]) POWD As needed with meals. (Patient not taking: Reported on 04/06/2018)  . nitroGLYCERIN (NITROSTAT) 0.4 MG SL tablet Place 1 tablet (0.4 mg total) under the tongue every 5 (five) minutes as needed for chest pain.  . pantoprazole (PROTONIX) 40 MG tablet Take 40 mg by mouth 3 (three) times a week.   . tamsulosin (FLOMAX) 0.4 MG CAPS capsule Take 0.4 mg by mouth daily.  . traZODone (DESYREL) 50 MG tablet TAKE 1 TABLET BY MOUTH EVERYDAY AT BEDTIME (Patient taking differently: Take 25 mg by mouth at bedtime. )    PAST MEDICAL HISTORY:   Past Medical History:  Diagnosis Date  . Arthritis   . BPH (benign prostatic hypertrophy) with urinary retention   . Chronic diastolic CHF (congestive heart failure) ([place])   . Colon polyp   . Coronary atherosclerosis of native coronary artery    a. s/p CABG 11/2003. b. s/p DES to radial-OM and SVG-RCA 11/05. c. s/p BMS to LCx 11/2008.  . Dilated aortic root ([place]) 05/29/2014   4m by echo 04/2017  . Diverticulosis   . Dyslipidemia    statin intolerant  . Elevated fasting glucose   . Gastritis      a. by EGD 04/2004.  . H/O urinary retention    post surgical procedures; has foley cath  . Headache(784.0)   . Hiatal hernia   . HOH (hard of hearing)    slight hoh, rt>l  . HTN (hypertension)   . Hypertensive heart disease   . Insomnia   . [place]acranial hemorrhage ([place]    a. [place].  .Marland KitchenLBBB (left bundle branch block)   . Lumbar spondylolysis    multilevel DJD with mild spinal stenosis L3-L5  . Migraines   . Parkinson's disease ([place]   . Peptic ulcer disease    a. prior hx of bleeding ulcers, including 12/2003 in setting of aspirin use.  . Renal insufficiency    renal duplex scan in 2012 showed no renal artery stenosis  . Sleep disturbance   . Stroke ([facility]    no clinical manifestations - seen on neuroimaging  . [place]rombocytopenia ([place]   . Tremor   . UTI (urinary tract infection)   . Wears glasses     PAST SURGICAL HISTORY:   Past Surgical History:  Procedure Laterality Date  . APPENDECTOMY  62   1963  . CATARACT EXTRACTION, BILATERAL    . CHOLECYSTECTOMY  1998  . COLON SURGERY  03/14/2008  . COLONOSCOPY  10/2001  . CORONARY ANGIOPLASTY  6/10,10/05   Dr. TFransico Him cardiologist: last office visit 11/07/14  . CORONARY ARTERY BYPASS GRAFT  2005  . CYSTOSCOPY WITH LITHOLAPAXY N/A 11/19/2014   Procedure: CYSTOSCOPY WITH LITHOLAPAXY;  Surgeon: MKathie Rhodes MD;  [facility]  Service: Urology;  Laterality: N/A;  . ESOPHAGOGASTRODUODENOSCOPY     10/2001, 12/2003, 04/2004  . LUMBAR LAMINECTOMY/DECOMPRESSION MICRODISCECTOMY N/A 05/03/2013   Procedure: LUMBAR LAMINECTOMY/DCOMPRESSION MICRODISCECTOMY RIGHT LUMBAR FOUR FIVE;  Surgeon: GElaina Hoops MD;  [facility];  Service: Neurosurgery;  Laterality: N/A;  . SHOULDER ARTHROSCOPY Left 9/10  . stones     hx  . TRANSURETHRAL RESECTION OF PROSTATE N/A 11/19/2014   Procedure: TRANSURETHRAL RESECTION OF THE PROSTATE WITH GYRUS INSTRUMENTS;  Surgeon: [name], MD;  [facility];  Service: Urology;  Laterality: N/A;  . WISDOM TOOTH EXTRACTION      SOCIAL HISTORY:   Social History   Socioeconomic History  . Marital status: Married    Spouse name: [name]  . Number of children: 4  . Years of education: College  . Highest education level: Not on file  Occupational History  . Occupation: retired    Comment: Psychiatrist  Social Needs  . Financial resource strain: Not on file  . Food insecurity:    Worry: Not on file    Inability: Not on file  . Transportation needs:    Medical: Not on file    Non-medical: Not on file  Tobacco Use  . Smoking status: Former Smoker    Packs/day: 2.00    Years: 5.00    Pack years: 10.00    Types: Cigarettes    Last attempt to quit: 04/28/1959    Years since quitting: 58.9  . Smokeless tobacco: Never Used  . Tobacco comment: 55+ yrs ago  Substance and Sexual Activity  . Alcohol use: No  . Drug use: No  . Sexual activity: Not on file  Lifestyle  . Physical activity:    Days per week: Not on file    Minutes per session: Not on file  . Stress: Not on file  Relationships  . Social connections:    Talks on phone: Not on file    Gets together: Not on file    Attends religious service: Not on file    Active member of club or organization: Not on file    Attends meetings of clubs or organizations: Not on file    Relationship status: Not on file  . Intimate partner violence:    Fear of current or ex partner: Not on file    Emotionally abused: Not on file    Physically abused: Not on file    Forced sexual activity: Not on file  Other Topics Concern  . Not on file  Social History Narrative   Patient is married to [name]), has 4 children   Patient is right handed   Education level is college   Caffeine consumption is none    FAMILY HISTORY:   Family Status  Relation Name Status  . Mother  Deceased  . Father  Deceased at age 110  . Sister  Alive  . Child x4 Alive    ROS:  A  complete 10 system review of systems was obtained and was unremarkable apart from what is mentioned above.  PHYSICAL EXAMINATION:    VITALS:   There were no vitals filed for this visit.  GEN:  The patient appears stated age and is in NAD.  Pt easily went from pleasant to easily agitated.  Also easily falls asleep during history (wife states that this is typical after taking AM meds) HEENT:  Normocephalic, atraumatic.  The mucous membranes are dry. The superficial temporal arteries are without ropiness or tenderness. CV:  RRR Lungs:  CTAB Neck/HEME:  There are no carotid bruits bilaterally.  Neurological examination:  Orientation: The patient is alert.  Unable to do MoCA/cognitive screening as patient is easily agitated.   Cranial nerves: There is good facial symmetry. There is facial hypomimia.  Pupils are equal round and reactive to light bilaterally. Fundoscopic exam is attempted but the disc margins are not well visualized bilaterally.   Extraocular muscles are intact. The visual fields are  full to confrontational testing. The speech is fluent and clear. Soft palate rises symmetrically and there is no tongue deviation. Hearing is intact to conversational tone. Sensation: Sensation is intact to light and pinprick throughout (facial, trunk, extremities). Vibration is intact at the bilateral big toe. There is no extinction with double simultaneous stimulation. There is no sensory dermatomal level identified. Motor: Strength is 5/5 in the bilateral upper and lower extremities.   Shoulder shrug is equal and symmetric.  There is no pronator drift. Deep tendon reflexes: Deep tendon reflexes are 2-/4 at the bilateral biceps, triceps, brachioradialis, patella. Plantar responses are downgoing bilaterally.  Movement examination: Tone: The patient has trouble relaxing to properly assess.  He resists passive motion Abnormal movements: none Coordination:  There is minor decremation with RAM's on the L  but some of this is apraxia.   Gait and Station: The patient has difficulty arising out of a deep-seated chair without the use of the hands. Even with pushing off of the chair, assist is requred.  The patient's stride length is decreased and drags the left leg.  Does better with  walker.    Addendum labs: Labs were received from the patient's primary care physician and reviewed.  Labs were dated March 09, 2018.  TSH was 4.16.  Sodium 142, potassium 4.4, chloride 104, CO2 29, BUN 19 and creatinine 1.35.  ASSESSMENT/PLAN:  1.  Parkinson's disease  -Has had the diagnosis at least since 2015, and symptoms prior to that.  Although medication has been tried, it certainly has not been tried for more than a few days per detailed review of the records.  He is convinced that he cannot tolerate the medication.  Discussed with the patient and his wife that there has never been a patient that I have had that is completely intolerant to levodopa, although different versions are sometimes needed.  They are wanting to try carbidopa/levodopa 25/100 CR, 1 tablet daily for 1 week, then 1 tablet twice per day for a week and then 1 tablet 3 times per day.  Risks, benefits, side effects and alternative therapies were discussed.  The opportunity to ask questions was given and they were answered to the best of my ability.  The patient expressed understanding and willingness to follow the outlined treatment protocols.  -We discussed the diagnosis as well as pathophysiology of the disease.  We discussed treatment options as well as prognostic indicators.  Patient education was provided.  -We discussed that it used to be thought that levodopa would increase risk of melanoma but now it is believed that Parkinsons itself likely increases risk of melanoma. he is to get regular skin checks.  -We discussed community resources in the area including patient support groups and community exercise programs for PD and pt education was  provided to the patient.  Met with Education officer, museum (briefly as pt a bit agitated with doing this)   2.  htn  -BP is low today and the patient is very sleepy.  Wonder if he needs so many BP meds.  Wife hasn't even given him all meds yet today.  To f/u with Dr. Radford Pax but will copy note to her as well.  3.  Aspiration pneumonia  -MBE 11/16/17.  This demonstrated moderate to severe primarily motor impairments causing laryngeal penetration and aspiration of thin and nectar barium.  A dysphagia 3 diet with nectar thick liquids, hard coughing after liquids was recommended.  Patient's wife is having him eat thicker consistency food, but the  patient really does not like nectar thick liquid.  Understands the risk.  -has a DNR order per wife.    -using applesauce as vehicle for food.  4.  Suspect Dementia  -needs 24 hour per day care and discussed this with them.  ***Patient will not allow for cognitive testing, either formal neurocognitive testing or Moca.  He becomes agitated.    5.  Insomnia  -has melatonin but not really taking faithfully.  Told his wife to try the 5 mg that she has at home faithfully.  6.  ***   Cc:  [name], MD

## 2018-04-07 NOTE — Progress Notes (Signed)
OT Cancellation Note  Patient Details Name: Darryl [name] MRN: [id number] DOB: [birth year]   Cancelled Treatment:     Reason Evaluation not completed/pt not seen: Orders received and chart reviewed for occupational therapy. Pt is currently waiting on MRI. As per verbal discussion, RN staff asked that OT check back later as able for acute OT assessment when medically appropriate.  Percell Miller Beth Dixon, OTR/L 04/07/2018, 9:32 AM

## 2018-04-07 NOTE — Evaluation (Signed)
SLP Cancellation Note  Patient Details Name: Darryl [name] MRN: [id number] DOB: 1934-08-10   Cancelled treatment:       Reason Eval/Treat Not Completed: Other (comment)(pt at MRI, will continue efforts, pt has known dysphagia from his Parkinson's disease = MBS 07/2017 showed aspiration of thin, chin tuck worsened swallow/airway protection)   Macario Golds 04/07/2018, 10:23 AM   Luanna Salk, [place] [facility] SLP Acute Rehab Services Pager [phone] Office [phone]

## 2018-04-07 NOTE — Care Management Note (Signed)
Case Management Note  Patient Details  Name: Darryl [name] MRN: [id number] Date of Birth: 1934/08/15  Subjective/Objective:  Admitted w/Acute encephalopathy. From home. PT cons-await recc.                  Action/Plan:d/c plan home.   Expected Discharge Date:                  Expected Discharge Plan:     In-House Referral:     Discharge planning Services  CM Consult  Post Acute Care Choice:    Choice offered to:     DME Arranged:    DME Agency:     HH Arranged:    HH Agency:     Status of Service:  In process, will continue to follow  If discussed at Long Length of Stay Meetings, dates discussed:    Additional Comments:  Dessa Phi, RN 04/07/2018, 11:30 AM

## 2018-04-07 NOTE — Progress Notes (Signed)
  Echocardiogram 2D Echocardiogram has been performed.  Darlina Sicilian M 04/07/2018, 9:48 AM

## 2018-04-07 NOTE — Progress Notes (Signed)
Initial Nutrition Assessment  DOCUMENTATION CODES:   Not applicable  INTERVENTION:  - Diet advancement as medically feasible. - Will order Ensure Enlive BID, each supplement provides 350 kcal and 20 grams of protein. If thickened liquids required, please thicken to appropriate consistency.  - RD will continue to monitor for additional nutrition-related needs.    NUTRITION DIAGNOSIS:   Inadequate oral intake related to inability to eat as evidenced by NPO status.  GOAL:   Patient will meet greater than or equal to 90% of their needs  MONITOR:   Diet advancement, Weight trends, Labs  REASON FOR ASSESSMENT:   Consult Assessment of nutrition requirement/status  ASSESSMENT:   83 y.o. male with medical history significant of LBBB, CAD, HTN, CHF, HLD, Parkinson's disease, BPH, CVA, tremor, thrombocytopenia or recurrent UTIs, dementia. He presented to the ED with increased irritability and right leg pain. At baseline he walks with a walker and he has chronic back pain, but the night PTA the pain was radiating down his legs. Patient also reported chest pain PTA.   BMI indicates overweight status, appropriate for age. Patient has been NPO since admission. Last PO intakes were toast and scrambled eggs for breakfast yesterday and a ham sandwich for dinner yesterday. Patient seems to be somewhat confused during RD visit. Wife is at bedside and provides most of the information. He denied abdominal pain or nausea now or PTA. Patient feeds himself at home and eats regular consistency foods and drinks thin liquids.  Appetite and PO intakes are variable from day-to-day. He always eats well for dinner. He often does not want breakfast so wife will offer him and Ensure, which he always accepts and enjoys. Patient also likes orange soda (typically drinks 1/day) and milkshakes. He eats most of the items prepared for him without issue but sometimes will experience coughing. Wife reports that he was told  by a care giver to take a bite of food, take a sip of liquids, and then take another bite but patient does not listen to this and prefers to drink items throughout the meal.  Wife has kept records of his outpatient doctor's appointments and weights during those visits. In March he weighed 184 lb and since that time has fluctuated between 175-184 lb. Current weight is 175 lb and he weighed 179 lb on 9/25. This indicates 4 lb weight loss (2% body weight); not significant for time frame.   Medications reviewed. Labs reviewed; creatinine: 1.26 mg/dL, GFR: 52 mL/min.       NUTRITION - FOCUSED PHYSICAL EXAM:  Completed; no muscle and no fat wasting.   Diet Order:   Diet Order            Diet NPO time specified  Diet effective now              EDUCATION NEEDS:   No education needs have been identified at this time  Skin:  Skin Assessment: Reviewed RN Assessment  Last BM:  10/7  Height:   Ht Readings from Last 1 Encounters:  04/06/18 5\' 9"  (1.753 m)    Weight:   Wt Readings from Last 1 Encounters:  04/06/18 79.4 kg    Ideal Body Weight:  72.73 kg  BMI:  Body mass index is 25.84 kg/m.  Estimated Nutritional Needs:   Kcal:  5427-0623  Protein:  65-75 grams  Fluid:  >/= 1.8 L/day     Jarome Matin, MS, RD, LDN, [facility] Inpatient Clinical Dietitian Pager # [phone] After hours/weekend pager #  [phone]  

## 2018-04-07 NOTE — ED Notes (Signed)
Abby RN room [phone] called with update r/t pt c/o chest pain negative troponin and medicated with GI cocktail and morphine 2 mg IV prior to transport to room 1406.

## 2018-04-07 NOTE — Progress Notes (Addendum)
OT Cancellation Note  Patient Details Name: Darryl [name] MRN: [id number] DOB: 10-Nov-1934   Cancelled Treatment:      Reason Eval/Treat Not Completed: Second attempt, Patient at procedure or test/unavailable, at MRI. Will check back another day/time.   Percell Miller Beth Dixon, OTR/L  04/07/2018, 11:47 AM

## 2018-04-07 NOTE — Progress Notes (Signed)
Carotid duplex prelim: 1-39% ICA stenosis.  Blakelyn Dinges Eunice, RDMS, RVT   

## 2018-04-07 NOTE — Telephone Encounter (Signed)
Have been following.  MRI brain did not show stroke but is being worked up.

## 2018-04-07 NOTE — Progress Notes (Signed)
PT Cancellation Note  Patient Details Name: YOUNES DEGEORGE MRN: [id number] DOB: 16-Jan-1935   Cancelled Treatment:    Reason Eval/Treat Not Completed: Patient at procedure or test/unavailable. Will check back another day/time.    Weston Anna, PT Acute Rehabilitation Services Pager: [phone] Office: [phone]

## 2018-04-08 ENCOUNTER — Encounter (HOSPITAL_COMMUNITY): Payer: Self-pay

## 2018-04-08 ENCOUNTER — Ambulatory Visit: Payer: Medicare Other | Admitting: Neurology

## 2018-04-08 DIAGNOSIS — N400 Enlarged prostate without lower urinary tract symptoms: Secondary | ICD-10-CM

## 2018-04-08 DIAGNOSIS — G20 Parkinson's disease: Secondary | ICD-10-CM

## 2018-04-08 LAB — LIPID PANEL
CHOL/HDL RATIO: 3.5 ratio
CHOLESTEROL: 122 mg/dL (ref 0–200)
HDL: 35 mg/dL — AB (ref >40)
LDL Cholesterol: 59 mg/dL (ref 0–99)
Triglycerides: 138 mg/dL (ref <150)
VLDL: 28 mg/dL (ref 0–40)

## 2018-04-08 LAB — URINE CULTURE

## 2018-04-08 LAB — HEMOGLOBIN A1C
HEMOGLOBIN A1C: 5.3 % (ref 4.8–5.6)
MEAN PLASMA GLUCOSE: 105.41 mg/dL

## 2018-04-08 MED ORDER — HYDROCODONE-ACETAMINOPHEN 5-325 MG PO TABS: 1.0000 | ORAL_TABLET | ORAL | 0 refills | Status: DC | PRN

## 2018-04-08 MED ORDER — TAMSULOSIN HCL 0.4 MG PO CAPS: 0.4000 mg | ORAL_CAPSULE | Freq: Every day | ORAL | Status: DC

## 2018-04-08 NOTE — Discharge Summary (Addendum)
Physician Discharge Summary  Darryl [name] JOA:[id number] DOB: 10-13-1934 DOA: 04/06/2018  PCP: [name], MD  Admit date: 04/06/2018 Discharge date: 04/19/2018  Admitted From: Home Disposition:  Home  Recommendations for Outpatient Follow-up:  1. Follow up with PCP in 1-2 weeks 2. Please obtain BMP/CBC in one week 3. Palliative care to follow-up at home.  Home Health:Yes Equipment/Devices:wheelchair  Discharge Condition:guarded CODE STATUS:DNR Diet recommendation: Heart Healthy  Brief/Interim Summary: 83 y.o. male past medical history of left bundle branch block, hypertension, chronic diastolic heart failure number cytopenia presents with increased right leg pain is found to be combative by EMS  Discharge Diagnoses:  Active Problems:   Dyslipidemia   LBBB (left bundle branch block)   Parkinson's disease (HCC)   Benign prostatic hyperplasia   CAD (coronary artery disease), native coronary artery   Chronic diastolic heart failure (HCC)   Acute metabolic encephalopathy   Unstable gait   Chest pain   CKD (chronic kidney disease) stage 3, GFR 30-59 ml/min (HCC)   Ataxia  Acute metabolic encephalopathy/unstable gait: MRI of the brain showed no acute stroke but it did show significant atrophy of his brain for his age. Influence of PCR was negative MRI of the brain showed no acute findings, PT OT recommended home health PT echocardiogram showed no acute findings carotid Doppler showed no ICA stenosis x-ray of the lumbar spine show significant multilevel degenerative disease. See visit ataxia is likely a combination of his significantly atrophied brain I am surprised his dementia has not flourished inside the hospital but he does have some cognitive impairments along with his degenerative back disease. Physical therapy recommended home health PT. We consulted palliative care who recommended to follow-up as an outpatient.  Chest pain: Cardiac biomarkers remain negative he  denies any chest pain or shortness of breath.  Dyslipidemia: Continue statin.  Left bundle branch block: Stable.  Benign prostatic hypertrophy: Continue Flomax.  Chronic diastolic heart failure: No changes were made.  Chronic kidney disease stage III: His creatinine remained at baseline.    Discharge Instructions  Discharge Instructions    Diet - low sodium heart healthy   Complete by:  As directed    Diet - low sodium heart healthy   Complete by:  As directed    Increase activity slowly   Complete by:  As directed    Increase activity slowly   Complete by:  As directed      Allergies as of 04/08/2018      Reactions   Ambien [zolpidem Tartrate] Other (See Comments)   hallucinations   Aspirin Other (See Comments)   ulcers   Celebrex [celecoxib] Other (See Comments)   Due to ulcer.   Nsaids Other (See Comments)   Due to ulcer.   Toprol Xl [metoprolol Tartrate] Swelling   Altace [ramipril]    UNKNOWN   Amlodipine    Feet swell   Levothyroxine Other (See Comments)   "made him feel bad"   Biaxin [clarithromycin] Nausea Only   Tetracyclines & Related Nausea Only      Medication List    STOP taking these medications   ciprofloxacin 500 MG tablet Commonly known as:  CIPRO     TAKE these medications   acetaminophen 500 MG tablet Commonly known as:  TYLENOL Take 1,000 mg by mouth every 8 (eight) hours as needed for mild pain or moderate pain.   aspirin EC 81 MG tablet Take 81 mg by mouth. Patient takes 3 times a week   Carbidopa-Levodopa  ER 25-100 MG tablet controlled release Commonly known as:  SINEMET CR Take 1 tablet by mouth 3 (three) times daily.   carvedilol 6.25 MG tablet Commonly known as:  COREG Take 1 tablet (6.25 mg total) by mouth 2 (two) times daily.   cephALEXin 250 MG capsule Commonly known as:  KEFLEX Take 250 mg by mouth at bedtime.   citalopram 20 MG tablet Commonly known as:  CELEXA Take 20 mg by mouth at bedtime.    cloNIDine 0.2 MG tablet Commonly known as:  CATAPRES Take 1.5 tablets (0.3 mg total) by mouth 2 (two) times daily.   furosemide 20 MG tablet Commonly known as:  LASIX Take 1 tablet (20 mg total) by mouth daily.   hydrALAZINE 25 MG tablet Commonly known as:  APRESOLINE Take 1 tablet (25 mg total) by mouth 3 (three) times daily.   HYDROcodone-acetaminophen 5-325 MG tablet Commonly known as:  NORCO/VICODIN Take 1-2 tablets by mouth every 4 (four) hours as needed for moderate pain.   irbesartan 300 MG tablet Commonly known as:  AVAPRO Take 300 mg by mouth at bedtime.   KLOR-CON 10 10 MEQ tablet Generic drug:  potassium chloride Take 1 tablet (10 mEq total) by mouth daily.   LORazepam 1 MG tablet Commonly known as:  ATIVAN Take 1 tablet (1 mg total) by mouth 2 (two) times daily as needed for anxiety.   nitroGLYCERIN 0.4 MG SL tablet Commonly known as:  NITROSTAT Place 1 tablet (0.4 mg total) under the tongue every 5 (five) minutes as needed for chest pain.   pantoprazole 40 MG tablet Commonly known as:  PROTONIX Take 40 mg by mouth 3 (three) times a week.   RESOURCE THICKENUP CLEAR Powd As needed with meals.   tamsulosin 0.4 MG Caps capsule Commonly known as:  FLOMAX Take 0.4 mg by mouth daily.   traZODone 50 MG tablet Commonly known as:  DESYREL TAKE 1 TABLET BY MOUTH EVERYDAY AT BEDTIME What changed:  See the new instructions.      Follow-up Information    [facility]  Follow up.   Why:  Home nurse visit Contact information: [facility].   Why:  wheelchair Contact information: [place] [address] [phone]          Allergies  Allergen Reactions  . Ambien [Zolpidem Tartrate] Other (See Comments)    hallucinations  . Aspirin Other (See Comments)    ulcers  . Celebrex [Celecoxib] Other (See  Comments)    Due to ulcer.  . Nsaids Other (See Comments)    Due to ulcer.  . Toprol Xl [Metoprolol Tartrate] Swelling  . Altace [Ramipril]     UNKNOWN  . Amlodipine     Feet swell  . Levothyroxine Other (See Comments)    "made him feel bad"  . Biaxin [Clarithromycin] Nausea Only  . Tetracyclines & Related Nausea Only    Consultations:  None   Procedures/Studies: Dg Chest 2 View  Result Date: 04/06/2018 CLINICAL DATA:  Weakness EXAM: CHEST - 2 VIEW COMPARISON:  03/20/2018 chest radiograph. FINDINGS: Intact sternotomy wires. Stable cardiomediastinal silhouette with normal heart size. No pneumothorax. No pleural effusion. Lungs appear clear, with no acute consolidative airspace disease and no pulmonary edema. IMPRESSION: No active cardiopulmonary disease. Electronically Signed   By: Ilona Sorrel M.D.   On: 04/06/2018 18:23  Dg Chest 2 View  Result Date: 03/20/2018 CLINICAL DATA:  Altered mental status. EXAM: CHEST - 2 VIEW COMPARISON:  01/29/2018 FINDINGS: Prior CABG. Heart is normal size. Lungs clear. No effusions or acute bony abnormality. IMPRESSION: No active cardiopulmonary disease. Electronically Signed   By: Rolm Baptise M.D.   On: 03/20/2018 17:33   Dg Lumbar Spine Complete  Result Date: 04/06/2018 CLINICAL DATA:  Weakness. No reported injury. EXAM: LUMBAR SPINE - COMPLETE 4+ VIEW COMPARISON:  09/08/2010 CT abdomen/pelvis FINDINGS: This report assumes 5 non rib-bearing lumbar vertebrae. Lumbar vertebral body heights are preserved, with no fracture. Moderate multilevel lumbar degenerative disc disease, most prominent at L1-2. No spondylolisthesis. Bilateral L5 pars defects, better seen on 2012 CT. Mild lower lumbar facet arthropathy. No aggressive appearing focal osseous lesions. IMPRESSION: 1. No lumbar spine fracture or spondylolisthesis. 2. Moderate multilevel lumbar degenerative disc disease, most prominent at L1-2. 3. Bilateral L5 pars defects, better seen on 2012 CT. 4.  Mild lower lumbar facet arthropathy. Electronically Signed   By: Ilona Sorrel M.D.   On: 04/06/2018 18:27   Ct Head Wo Contrast  Result Date: 04/06/2018 CLINICAL DATA:  Altered mental status EXAM: CT HEAD WITHOUT CONTRAST TECHNIQUE: Contiguous axial images were obtained from the base of the skull through the vertex without intravenous contrast. COMPARISON:  03/20/2018 FINDINGS: Brain: Mild atrophic changes and chronic white matter ischemic changes are again seen and stable. No findings to suggest acute hemorrhage, acute infarction or space-occupying mass lesion are seen. Vascular: No hyperdense vessel or unexpected calcification. Skull: Normal. Negative for fracture or focal lesion. Sinuses/Orbits: No acute finding. Other: None. IMPRESSION: Chronic atrophic and ischemic changes without acute abnormality. Electronically Signed   By: Inez Catalina M.D.   On: 04/06/2018 21:37   Ct Head Wo Contrast  Result Date: 03/20/2018 CLINICAL DATA:  83 year old male with altered mental status. EXAM: CT HEAD WITHOUT CONTRAST TECHNIQUE: Contiguous axial images were obtained from the base of the skull through the vertex without intravenous contrast. COMPARISON:  10/08/2017 CT and prior studies FINDINGS: Brain: No evidence of acute infarction, hemorrhage, hydrocephalus, extra-axial collection or mass lesion/mass effect. Mild atrophy and moderate to severe chronic small-vessel white matter ischemic changes again noted. Vascular: No hyperdense vessel or unexpected calcification. Skull: Normal. Negative for fracture or focal lesion. Sinuses/Orbits: No acute finding. Other: None. IMPRESSION: 1. No evidence of acute intracranial abnormality. 2. Mild atrophy and moderate to severe chronic small-vessel white matter ischemic changes. Electronically Signed   By: Margarette Canada M.D.   On: 03/20/2018 16:14   Mr Brain Wo Contrast  Result Date: 04/07/2018 CLINICAL DATA:  Ataxia, stroke. EXAM: MRI HEAD WITHOUT CONTRAST MRA HEAD WITHOUT  CONTRAST TECHNIQUE: Multiplanar, multiecho pulse sequences of the brain and surrounding structures were obtained without intravenous contrast. Angiographic images of the head were obtained using MRA technique without contrast. COMPARISON:  CT head 04/06/2018.  MRI 08/26/2013 FINDINGS: MRI HEAD FINDINGS Brain: Negative for acute infarct. Severe chronic microvascular ischemic changes throughout the white matter basal ganglia and pons. Widespread severe chronic microhemorrhage throughout the cerebellum brainstem and both cerebral hemispheres and both basal ganglia similar to the prior study. No mass lesion. Moderate atrophy Vascular: Normal arterial flow voids Skull and upper cervical spine: Negative Sinuses/Orbits: Mild mucosal edema paranasal sinuses. Bilateral cataract surgery Other: None MRA HEAD FINDINGS Both vertebral arteries patent to the basilar. Distal right vertebral artery is small, likely hypoplastic distal to PICA. PICA patent bilaterally. Basilar widely patent. Superior cerebellar and posterior cerebral arteries patent  bilaterally. Fetal origin right posterior cerebral artery. Internal carotid artery widely patent bilaterally without stenosis. Anterior and middle cerebral arteries patent bilaterally without stenosis or occlusion Negative for aneurysm IMPRESSION: 1. Negative for acute infarct 2. Atrophy and severe chronic microvascular ischemia 3. Severe diffuse chronic microhemorrhage throughout the brain unchanged from prior studies. This could be due to poorly controlled hypertension or cerebral amyloid. 4. Negative MRA head Electronically Signed   By: Franchot Gallo M.D.   On: 04/07/2018 11:35   Mr Jodene Nam Head Wo Contrast  Result Date: 04/07/2018 CLINICAL DATA:  Ataxia, stroke. EXAM: MRI HEAD WITHOUT CONTRAST MRA HEAD WITHOUT CONTRAST TECHNIQUE: Multiplanar, multiecho pulse sequences of the brain and surrounding structures were obtained without intravenous contrast. Angiographic images of the head  were obtained using MRA technique without contrast. COMPARISON:  CT head 04/06/2018.  MRI 08/26/2013 FINDINGS: MRI HEAD FINDINGS Brain: Negative for acute infarct. Severe chronic microvascular ischemic changes throughout the white matter basal ganglia and pons. Widespread severe chronic microhemorrhage throughout the cerebellum brainstem and both cerebral hemispheres and both basal ganglia similar to the prior study. No mass lesion. Moderate atrophy Vascular: Normal arterial flow voids Skull and upper cervical spine: Negative Sinuses/Orbits: Mild mucosal edema paranasal sinuses. Bilateral cataract surgery Other: None MRA HEAD FINDINGS Both vertebral arteries patent to the basilar. Distal right vertebral artery is small, likely hypoplastic distal to PICA. PICA patent bilaterally. Basilar widely patent. Superior cerebellar and posterior cerebral arteries patent bilaterally. Fetal origin right posterior cerebral artery. Internal carotid artery widely patent bilaterally without stenosis. Anterior and middle cerebral arteries patent bilaterally without stenosis or occlusion Negative for aneurysm IMPRESSION: 1. Negative for acute infarct 2. Atrophy and severe chronic microvascular ischemia 3. Severe diffuse chronic microhemorrhage throughout the brain unchanged from prior studies. This could be due to poorly controlled hypertension or cerebral amyloid. 4. Negative MRA head Electronically Signed   By: Franchot Gallo M.D.   On: 04/07/2018 11:35    Subjective: No complains  Discharge Exam: Vitals:   04/07/18 2155 04/08/18 0429  BP: (!) 152/79 (!) 160/97  Pulse: 63 63  Resp:    Temp:  98.2 F (36.8 C)  SpO2: 98% 98%   Vitals:   04/07/18 1740 04/07/18 2034 04/07/18 2155 04/08/18 0429  BP: (!) 161/79 (!) 156/76 (!) 152/79 (!) 160/97  Pulse: 66 63 63 63  Resp: 16     Temp: 98.7 F (37.1 C) 98.1 F (36.7 C)  98.2 F (36.8 C)  TempSrc: Oral Oral  Oral  SpO2: 96% 96% 98% 98%  Weight:      Height:         General: Pt is alert, awake, not in acute distress Cardiovascular: RRR, S1/S2 +, no rubs, no gallops Respiratory: CTA bilaterally, no wheezing, no rhonchi Abdominal: Soft, NT, ND, bowel sounds + Extremities: no edema, no cyanosis    The results of significant diagnostics from this hospitalization (including imaging, microbiology, ancillary and laboratory) are listed below for reference.     Microbiology: No results found for this or any previous visit (from the past 240 hour(s)).   Labs: BNP (last 3 results) Recent Labs    11/15/17 0231  BNP 350.0*   Basic Metabolic Panel: No results for input(s): NA, K, CL, CO2, GLUCOSE, BUN, CREATININE, CALCIUM, MG, PHOS in the last 168 hours. Liver Function Tests: No results for input(s): AST, ALT, ALKPHOS, BILITOT, PROT, ALBUMIN in the last 168 hours. No results for input(s): LIPASE, AMYLASE in the last 168 hours. No results  for input(s): AMMONIA in the last 168 hours. CBC: No results for input(s): WBC, NEUTROABS, HGB, HCT, MCV, PLT in the last 168 hours. Cardiac Enzymes: No results for input(s): CKTOTAL, CKMB, CKMBINDEX, TROPONINI in the last 168 hours. BNP: Invalid input(s): POCBNP CBG: No results for input(s): GLUCAP in the last 168 hours. D-Dimer No results for input(s): DDIMER in the last 72 hours. Hgb A1c No results for input(s): HGBA1C in the last 72 hours. Lipid Profile No results for input(s): CHOL, HDL, LDLCALC, TRIG, CHOLHDL, LDLDIRECT in the last 72 hours. Thyroid function studies No results for input(s): TSH, T4TOTAL, T3FREE, THYROIDAB in the last 72 hours.  Invalid input(s): FREET3 Anemia work up No results for input(s): VITAMINB12, FOLATE, FERRITIN, TIBC, IRON, RETICCTPCT in the last 72 hours. Urinalysis    Component Value Date/Time   COLORURINE STRAW (A) 04/06/2018 [place] 04/06/2018 1549   LABSPEC 1.010 04/06/2018 1549   PHURINE 7.0 04/06/2018 1549   GLUCOSEU NEGATIVE 04/06/2018 1549    HGBUR NEGATIVE 04/06/2018 1549   BILIRUBINUR NEGATIVE 04/06/2018 1549   KETONESUR NEGATIVE 04/06/2018 1549   PROTEINUR NEGATIVE 04/06/2018 1549   UROBILINOGEN 1.0 10/11/2014 1609   NITRITE NEGATIVE 04/06/2018 1549   LEUKOCYTESUR NEGATIVE 04/06/2018 1549   Sepsis Labs Invalid input(s): PROCALCITONIN,  WBC,  LACTICIDVEN Microbiology No results found for this or any previous visit (from the past 240 hour(s)).   Time coordinating discharge: 40 minutes  SIGNED:   Charlynne Cousins, MD  [facility] 04/19/2018, 5:28 AM Pager   If 7PM-7AM, please contact night-coverage [email or web address] Password [email or web address]

## 2018-04-08 NOTE — Progress Notes (Addendum)
[facility] PROGRESS NOTE    Progress Note  Darryl [name]  JQB:[id number] DOB: June 07, 1935 DOA: 04/06/2018 PCP: [name], MD     Brief Narrative:   Darryl [name] is an 83 y.o. male past medical history of left bundle branch block, hypertension, chronic diastolic heart failure number cytopenia presents with increased right leg pain is found to be combative by EMS  Assessment/Plan:   Acute encephalopathy/Unstable gait MRI of the brain showed no acute stroke with no she shows significant atrophy Influenza PCR negative. MRA of the brain without contrast negative PT, OT, Speech consult pending Echocardiogram pending Carotid dopplers significant ICA stenosis. Likely multifactorial in the setting of significant brain atrophy, Parkinson disease with lumbar x-ray that show multilevel significant degenerative disc disease. Will likely need skilled nursing facility placement.  Chest pain: Cardiac biomarkers negative, sinus rhythm with an old left bundle branch block patient denies chest pain or shortness of breath.  Dyslipidemia: Continue statins.    LBBB (left bundle branch block) Stable  Parkinson's disease ([place]) Current home medication get physical therapy evaluation.  Benign prostatic hyperplasia Continue current medications.  CAD (coronary artery disease), native coronary artery Asymptomatic denies any chest pain or shortness of breath.  Chronic diastolic heart failure (HCC) Continue to hold diuretics.  CKD (chronic kidney disease) stage 3, GFR 30-59 ml/min (HCC) Baseline.   DVT prophylaxis: lovexno Family Communication:wife Disposition Plan/Barrier to D/C: unable to determine Code Status:     Code Status Orders  (From admission, onward)         Start     Ordered   04/07/18 0202  Do not attempt resuscitation (DNR)  Continuous    Question Answer Comment  In the event of cardiac or respiratory ARREST Do not call a "code blue"   In the event  of cardiac or respiratory ARREST Do not perform Intubation, CPR, defibrillation or ACLS   In the event of cardiac or respiratory ARREST Use medication by any route, position, wound care, and other measures to relive pain and suffering. May use oxygen, suction and manual treatment of airway obstruction as needed for comfort.      04/07/18 0201        Code Status History    Date Active Date Inactive Code Status Order ID Comments User Context   11/15/2017 0232 11/17/2017 1359 Full Code [id number]  Ivor Costa, MD ED   11/19/2014 1306 11/20/2014 1630 Full Code [id number]  [name], MD Inpatient   10/07/2014 1558 10/13/2014 1641 Full Code [id number]  Caren Griffins, MD ED   10/03/2014 2024 10/05/2014 1917 Full Code [id number]  Theressa Millard, MD Inpatient   05/03/2013 1212 05/04/2013 1317 Full Code [id number]  [name], MD Inpatient    Advance Directive Documentation     Most Recent Value  Type of Advance Directive  Healthcare Power of Attorney, Living will  Pre-existing out of facility DNR order (yellow form or pink MOST form)  -  "MOST" Form in Place?  -        IV Access:    Peripheral IV   Procedures and diagnostic studies:   Dg Chest 2 View  Result Date: 04/06/2018 CLINICAL DATA:  Weakness EXAM: CHEST - 2 VIEW COMPARISON:  03/20/2018 chest radiograph. FINDINGS: Intact sternotomy wires. Stable cardiomediastinal silhouette with normal heart size. No pneumothorax. No pleural effusion. Lungs appear clear, with no acute consolidative airspace disease and no pulmonary edema. IMPRESSION: No active cardiopulmonary disease. Electronically Signed   By: Rinaldo Ratel  Poff M.D.   On: 04/06/2018 18:23   Dg Lumbar Spine Complete  Result Date: 04/06/2018 CLINICAL DATA:  Weakness. No reported injury. EXAM: LUMBAR SPINE - COMPLETE 4+ VIEW COMPARISON:  09/08/2010 CT abdomen/pelvis FINDINGS: This report assumes 5 non rib-bearing lumbar vertebrae. Lumbar vertebral body heights are preserved, with no  fracture. Moderate multilevel lumbar degenerative disc disease, most prominent at L1-2. No spondylolisthesis. Bilateral L5 pars defects, better seen on 2012 CT. Mild lower lumbar facet arthropathy. No aggressive appearing focal osseous lesions. IMPRESSION: 1. No lumbar spine fracture or spondylolisthesis. 2. Moderate multilevel lumbar degenerative disc disease, most prominent at L1-2. 3. Bilateral L5 pars defects, better seen on 2012 CT. 4. Mild lower lumbar facet arthropathy. Electronically Signed   By: Ilona Sorrel M.D.   On: 04/06/2018 18:27   Ct Head Wo Contrast  Result Date: 04/06/2018 CLINICAL DATA:  Altered mental status EXAM: CT HEAD WITHOUT CONTRAST TECHNIQUE: Contiguous axial images were obtained from the base of the skull through the vertex without intravenous contrast. COMPARISON:  03/20/2018 FINDINGS: Brain: Mild atrophic changes and chronic white matter ischemic changes are again seen and stable. No findings to suggest acute hemorrhage, acute infarction or space-occupying mass lesion are seen. Vascular: No hyperdense vessel or unexpected calcification. Skull: Normal. Negative for fracture or focal lesion. Sinuses/Orbits: No acute finding. Other: None. IMPRESSION: Chronic atrophic and ischemic changes without acute abnormality. Electronically Signed   By: Inez Catalina M.D.   On: 04/06/2018 21:37   Mr Brain Wo Contrast  Result Date: 04/07/2018 CLINICAL DATA:  Ataxia, stroke. EXAM: MRI HEAD WITHOUT CONTRAST MRA HEAD WITHOUT CONTRAST TECHNIQUE: Multiplanar, multiecho pulse sequences of the brain and surrounding structures were obtained without intravenous contrast. Angiographic images of the head were obtained using MRA technique without contrast. COMPARISON:  CT head 04/06/2018.  MRI 08/26/2013 FINDINGS: MRI HEAD FINDINGS Brain: Negative for acute infarct. Severe chronic microvascular ischemic changes throughout the white matter basal ganglia and pons. Widespread severe chronic microhemorrhage  throughout the cerebellum brainstem and both cerebral hemispheres and both basal ganglia similar to the prior study. No mass lesion. Moderate atrophy Vascular: Normal arterial flow voids Skull and upper cervical spine: Negative Sinuses/Orbits: Mild mucosal edema paranasal sinuses. Bilateral cataract surgery Other: None MRA HEAD FINDINGS Both vertebral arteries patent to the basilar. Distal right vertebral artery is small, likely hypoplastic distal to PICA. PICA patent bilaterally. Basilar widely patent. Superior cerebellar and posterior cerebral arteries patent bilaterally. Fetal origin right posterior cerebral artery. Internal carotid artery widely patent bilaterally without stenosis. Anterior and middle cerebral arteries patent bilaterally without stenosis or occlusion Negative for aneurysm IMPRESSION: 1. Negative for acute infarct 2. Atrophy and severe chronic microvascular ischemia 3. Severe diffuse chronic microhemorrhage throughout the brain unchanged from prior studies. This could be due to poorly controlled hypertension or cerebral amyloid. 4. Negative MRA head Electronically Signed   By: Franchot Gallo M.D.   On: 04/07/2018 11:35   Mr Jodene Nam Head Wo Contrast  Result Date: 04/07/2018 CLINICAL DATA:  Ataxia, stroke. EXAM: MRI HEAD WITHOUT CONTRAST MRA HEAD WITHOUT CONTRAST TECHNIQUE: Multiplanar, multiecho pulse sequences of the brain and surrounding structures were obtained without intravenous contrast. Angiographic images of the head were obtained using MRA technique without contrast. COMPARISON:  CT head 04/06/2018.  MRI 08/26/2013 FINDINGS: MRI HEAD FINDINGS Brain: Negative for acute infarct. Severe chronic microvascular ischemic changes throughout the white matter basal ganglia and pons. Widespread severe chronic microhemorrhage throughout the cerebellum brainstem and both cerebral hemispheres and both basal ganglia similar  to the prior study. No mass lesion. Moderate atrophy Vascular: Normal arterial  flow voids Skull and upper cervical spine: Negative Sinuses/Orbits: Mild mucosal edema paranasal sinuses. Bilateral cataract surgery Other: None MRA HEAD FINDINGS Both vertebral arteries patent to the basilar. Distal right vertebral artery is small, likely hypoplastic distal to PICA. PICA patent bilaterally. Basilar widely patent. Superior cerebellar and posterior cerebral arteries patent bilaterally. Fetal origin right posterior cerebral artery. Internal carotid artery widely patent bilaterally without stenosis. Anterior and middle cerebral arteries patent bilaterally without stenosis or occlusion Negative for aneurysm IMPRESSION: 1. Negative for acute infarct 2. Atrophy and severe chronic microvascular ischemia 3. Severe diffuse chronic microhemorrhage throughout the brain unchanged from prior studies. This could be due to poorly controlled hypertension or cerebral amyloid. 4. Negative MRA head Electronically Signed   By: Franchot Gallo M.D.   On: 04/07/2018 11:35     Medical Consultants:    None.  Anti-Infectives:   None  Subjective:    [name] he relates his right lower extremity pain has subsided.  Objective:    Vitals:   04/07/18 1740 04/07/18 2034 04/07/18 2155 04/08/18 0429  BP: (!) 161/79 (!) 156/76 (!) 152/79 (!) 160/97  Pulse: 66 63 63 63  Resp: 16     Temp: 98.7 F (37.1 C) 98.1 F (36.7 C)  98.2 F (36.8 C)  TempSrc: Oral Oral  Oral  SpO2: 96% 96% 98% 98%  Weight:      Height:        Intake/Output Summary (Last 24 hours) at 04/08/2018 0810 Last data filed at 04/08/2018 5009 Gross per 24 hour  Intake 702 ml  Output 365 ml  Net 337 ml   Filed Weights   04/06/18 1356  Weight: 79.4 kg    Exam: General exam: In no acute distress. Respiratory system: Good air movement and clear to auscultation. Cardiovascular system: S1 & S2 heard, RRR.  Gastrointestinal system: Abdomen is nondistended, soft and nontender.  Central nervous system: Alert and  oriented.  Romberg sign positive, finger-to-nose positive. Extremities: No pedal edema. Skin: No rashes, lesions or ulcers Psychiatry: Judgement and insight appear normal. Mood & affect appropriate.    Data Reviewed:    Labs: Basic Metabolic Panel: Recent Labs  Lab 04/06/18 1600 04/07/18 0546  NA 143 141  K 3.7 4.0  CL 108 107  CO2 28 24  GLUCOSE 120* 119*  BUN 18 19  CREATININE 1.26* 1.26*  CALCIUM 9.5 9.3  MG  --  2.2  PHOS  --  2.8   GFR Estimated Creatinine Clearance: 46.8 mL/min (A) (by C-G formula based on SCr of 1.26 mg/dL (H)). Liver Function Tests: Recent Labs  Lab 04/07/18 0546  AST 21  ALT 19  ALKPHOS 63  BILITOT 1.1  PROT 6.2*  ALBUMIN 3.5   No results for input(s): LIPASE, AMYLASE in the last 168 hours. No results for input(s): AMMONIA in the last 168 hours. Coagulation profile No results for input(s): INR, PROTIME in the last 168 hours.  CBC: Recent Labs  Lab 04/06/18 1600 04/07/18 0546  WBC 8.5 9.4  HGB 14.5 13.7  HCT 41.2 38.5*  MCV 90.0 91.4  PLT 132* 133*   Cardiac Enzymes: Recent Labs  Lab 04/06/18 2351 04/07/18 0546 04/07/18 1141  TROPONINI <0.03 <0.03 <0.03   BNP (last 3 results) No results for input(s): PROBNP in the last 8760 hours. CBG: No results for input(s): GLUCAP in the last 168 hours. D-Dimer: No results for input(s): DDIMER  in the last 72 hours. Hgb A1c: Recent Labs    04/07/18 0546  HGBA1C 5.3   Lipid Profile: No results for input(s): CHOL, HDL, LDLCALC, TRIG, CHOLHDL, LDLDIRECT in the last 72 hours. Thyroid function studies: Recent Labs    04/07/18 0546  TSH 5.441*   Anemia work up: No results for input(s): VITAMINB12, FOLATE, FERRITIN, TIBC, IRON, RETICCTPCT in the last 72 hours. Sepsis Labs: Recent Labs  Lab 04/06/18 1600 04/07/18 0546  WBC 8.5 9.4   Microbiology Recent Results (from the past 240 hour(s))  Urine Culture     Status: Abnormal   Collection Time: 04/06/18  3:49 PM  Result  Value Ref Range Status   Specimen Description   Final    URINE, CLEAN CATCH Performed at [facility] [address] [place], [place] [address]    Special Requests   Final    NONE Performed at [facility], [place] [address], [place], [place] [address]    Culture (A)  Final    >=100,000 COLONIES/mL CORYNEBACTERIUM SPECIES Standardized susceptibility testing for this organism is not available. Performed at [facility], [place] [address], [place], [place] [address]    Report Status 04/08/2018 FINAL  Final     Medications:   .  stroke: mapping our early stages of recovery book   Does not apply Once  . aspirin EC  81 mg Oral Daily  . carvedilol  6.25 mg Oral BID  . citalopram  20 mg Oral QHS  . cloNIDine  0.3 mg Oral BID  . enoxaparin (LOVENOX) injection  40 mg Subcutaneous Q24H  . feeding supplement (ENSURE ENLIVE)  237 mL Oral BID BM  . hydrALAZINE  25 mg Oral TID  . irbesartan  300 mg Oral QHS  . pantoprazole  40 mg Oral Once per day on Mon Wed Fri  . traZODone  25 mg Oral QHS   Continuous Infusions: . sodium chloride 10 mL/hr at 04/08/18 0627      LOS: 1 day   Charlynne Cousins  [facility] Pager [phone]  *Please refer to [email or web address], password [email or web address] to get updated schedule on who will round on this patient, as hospitalists switch teams weekly. If 7PM-7AM, please contact night-coverage at [email or web address], password [email or web address] for any overnight needs.  04/08/2018, 8:10 AM

## 2018-04-08 NOTE — Progress Notes (Signed)
[facility] ([facility]  Notified by Genoveva Ill, of family request for [facility] services at home after discharge.  Chart and patient information under review by [facility] physician.  Hospice eligibility pending at this time.  [name]  Spoke with pt, wife and daughter at the bedside to initiate education related to hospice philosophy, services and team approach to pt care.  They verbalized understanding of information given.  Per discussion, plan is to discharge today by private vehicle.  Please send signed and completed DNR home with the pt  Pt will need prescriptions for discharge comfort medications  DME needs have been discussed, and none are needed to go home today.  May need a wheel chair, but was ok to wait until initial RN visit to decide.    [facility]erral [facility] is aware of the above.  Completed discharge summary will need to be faxed to [facility] @ [phone] when final.  Please notify [facility] when pt is ready to leave the unit @ [phone] until 5pm, if after 5pm, please call [phone].    [facility] contact information given to [name] (wife) during this visit.  Above information shared with [facility].  Please call with any hospice related questions or concerns.  Thank you for this referral,  Venia Carbon BSN, RN [facility] Liaison (listed in [place]) [phone]

## 2018-04-08 NOTE — Evaluation (Signed)
Occupational Therapy Evaluation Patient Details Name: Darryl [name] MRN: [id number] DOB: [birth year] Today's Date: 04/08/2018    History of Present Illness Pt was admitted with acute encephalopathy.  MRI negative for acute infarct.  PMH: Parkinson's Disease, CVA, tremor, dementia,uti, CAD, CABG, CKD and aspiration pna   Clinical Impression   This 83 year old man was admitted for the above.  Pt's wife provides min A for set up and cues for thoroughness for adls.  Pt does not consistently call for assistance at night.  Amount of assistance needed can vary. Pt would benefit from continued OT to maximize balance to improve safety for home.     Follow Up Recommendations  Supervision/Assistance - 24 hour;Home health OT(and aide )    Equipment Recommendations  None recommended by OT    Recommendations for Other Services       Precautions / Restrictions Precautions Precautions: Fall Restrictions Weight Bearing Restrictions: No      Mobility Bed Mobility Overal bed mobility: Independent                Transfers Overall transfer level: Needs assistance Equipment used: Rolling walker (2 wheeled) Transfers: Sit to/from Stand Sit to Stand: Min guard         General transfer comment: for safety; cues for UE placement    Balance                                           ADL either performed or assessed with clinical judgement   ADL Overall ADL's : Needs assistance/impaired Eating/Feeding: Set up   Grooming: Set up;Supervision/safety   Upper Body Bathing: Minimal assistance   Lower Body Bathing: Minimal assistance   Upper Body Dressing : Set up   Lower Body Dressing: Min guard   Toilet Transfer: Min guard;Minimal assistance;Ambulation;RW   Toileting- Clothing Manipulation and Hygiene: Min guard         General ADL Comments: pt has variable performance with adls at home. Min A mostly needed to apply soap, toothpaste, etc. Min guard for  safety with sit to stand. Pt occasionally needs min A for balance      Vision         Perception     Praxis      Pertinent Vitals/Pain Pain Assessment: No/denies pain     Hand Dominance     Extremity/Trunk Assessment Upper Extremity Assessment Upper Extremity Assessment: Overall WFL for tasks assessed(mild tremor doesn't interfere)           Communication     Cognition Arousal/Alertness: Awake/alert Behavior During Therapy: WFL for tasks assessed/performed Overall Cognitive Status: Difficult to assess                                 General Comments: easily distracted   General Comments       Exercises     Shoulder Instructions      Home Living Family/patient expects to be discharged to:: Private residence Living Arrangements: Spouse/significant other Available Help at Discharge: Family;Available 24 hours/day Type of Home: House             Bathroom Shower/Tub: Teacher, early years/pre: Handicapped height     Home Equipment: Shower seat;Grab bars - tub/shower          Prior Functioning/Environment  Comments: min A in shower; cues assist for thoroughness and to initiate activities        OT Problem List: Decreased strength;Decreased activity tolerance;Impaired balance (sitting and/or standing);Decreased cognition;Decreased knowledge of use of DME or AE      OT Treatment/Interventions: Self-care/ADL training;Energy conservation;DME and/or AE instruction;Patient/family education;Balance training;Cognitive remediation/compensation;Therapeutic activities    OT Goals(Current goals can be found in the care plan section) Acute Rehab OT Goals Patient Stated Goal: home OT Goal Formulation: With patient/family Time For Goal Achievement: 04/22/18 Potential to Achieve Goals: Good ADL Goals Additional ADL Goal #1: pt will complete adl and toilet transfer with supervision for balance given min A for set up and cues as  needed for thoroughness of activities  OT Frequency: Min 2X/week   Barriers to D/C:            Co-evaluation PT/OT/SLP Co-Evaluation/Treatment: Yes Reason for Co-Treatment: For patient/therapist safety PT goals addressed during session: Mobility/safety with mobility OT goals addressed during session: ADL's and self-care      AM-PAC PT "6 Clicks" Daily Activity     Outcome Measure Help from another person eating meals?: A Little Help from another person taking care of personal grooming?: A Little Help from another person toileting, which includes using toliet, bedpan, or urinal?: A Little Help from another person bathing (including washing, rinsing, drying)?: A Little Help from another person to put on and taking off regular upper body clothing?: A Little Help from another person to put on and taking off regular lower body clothing?: A Little 6 Click Score: 18   End of Session    Activity Tolerance: Patient tolerated treatment well Patient left: in chair;with call bell/phone within reach;with chair alarm set;with family/visitor present  OT Visit Diagnosis: Unsteadiness on feet (R26.81)                Time: 5465-0354 OT Time Calculation (min): 27 min Charges:  OT General Charges $OT Visit: 1 Visit OT Evaluation $OT Eval Low Complexity: [place], OTR/L Acute Rehabilitation Services [phone] [facility] pager [phone] office 04/08/2018  [place] 04/08/2018, 11:42 AM

## 2018-04-08 NOTE — Evaluation (Signed)
Physical Therapy Evaluation Patient Details Name: Darryl [name] MRN: [id number] DOB: [birth year] Today's Date: 04/08/2018   History of Present Illness  Pt was admitted with acute encephalopathy.  MRI negative for acute infarct.  PMH: Parkinson's Disease, CVA, tremor, dementia,uti, CAD, CABG, CKD and aspiration pna  Clinical Impression  On eval, pt was Min guard assist for mobility. He walked ~125 feet with a RW. He participated well. Wife expressed some concerns about continuing pt's care at home. Per chart, it appears plan is for home with hospice. Will recommend HHPT f/u if this can be arranged in conjunction with hospice.   Patient suffers from Parkinson's disease which impairs their ability to perform daily activities like ambulation in the home and in the community.  A walker alone will not resolve the issues with performing activities of daily living. A wheelchair will allow patient to safely perform daily activities.  The patient can self propel in the home or has a caregiver who can provide assistance.        Follow Up Recommendations Home health PT;Supervision/Assistance - 24 hour    Equipment Recommendations  Wheelchair    Recommendations for Other Services       Precautions / Restrictions Precautions Precautions: Fall Restrictions Weight Bearing Restrictions: No      Mobility  Bed Mobility Overal bed mobility: Independent                Transfers Overall transfer level: Needs assistance Equipment used: Rolling walker (2 wheeled) Transfers: Sit to/from Stand Sit to Stand: Min guard         General transfer comment: close guard for safety. VCs hand placement.   Ambulation/Gait Ambulation/Gait assistance: Min guard Gait Distance (Feet): 125 Feet Assistive Device: Rolling Walker Gait Pattern/deviations: Step-through pattern     General Gait Details: close guard for safety.   Stairs            Wheelchair Mobility    Modified Rankin  (Stroke Patients Only)       Balance Overall balance assessment: Needs assistance           Standing balance-Leahy Scale: Fair                               Pertinent Vitals/Pain Pain Assessment: No/denies pain    Home Living Family/patient expects to be discharged to:: Private residence Living Arrangements: Spouse/significant other Available Help at Discharge: Family;Available 24 hours/day Type of Home: House         Home Equipment: Shower seat;Grab bars - tub/shower      Prior Function           Comments: min A in shower; cues assist for thoroughness and to initiate activities     Hand Dominance        Extremity/Trunk Assessment   Upper Extremity Assessment Upper Extremity Assessment: Defer to OT evaluation    Lower Extremity Assessment Lower Extremity Assessment: Generalized weakness    Cervical / Trunk Assessment Cervical / Trunk Assessment: Normal  Communication      Cognition Arousal/Alertness: Awake/alert Behavior During Therapy: WFL for tasks assessed/performed Overall Cognitive Status: Difficult to assess                                 General Comments: easily distracted. mostly [facility]      General Comments      Exercises  Assessment/Plan    PT Assessment Patient needs continued PT services  PT Problem List Decreased mobility;Decreased balance;Decreased cognition       PT Treatment Interventions DME instruction;Gait training;Functional mobility training;Therapeutic activities;Balance training;Patient/family education;Therapeutic exercise    PT Goals (Current goals can be found in the Care Plan section)  Acute Rehab PT Goals Patient Stated Goal: home PT Goal Formulation: With patient/family Time For Goal Achievement: 04/22/18 Potential to Achieve Goals: Good    Frequency Min 3X/week   Barriers to discharge        Co-evaluation   Reason for Co-Treatment: For patient/therapist safety PT  goals addressed during session: Mobility/safety with mobility OT goals addressed during session: ADL's and self-care       AM-PAC PT "6 Clicks" Daily Activity  Outcome Measure Difficulty turning over in bed (including adjusting bedclothes, sheets and blankets)?: None Difficulty moving from lying on back to sitting on the side of the bed? : None Difficulty sitting down on and standing up from a chair with arms (e.g., wheelchair, bedside commode, etc,.)?: A Little Help needed moving to and from a bed to chair (including a wheelchair)?: A Little Help needed walking in hospital room?: A Little Help needed climbing 3-5 steps with a railing? : A Little 6 Click Score: 20    End of Session Equipment Utilized During Treatment: Gait belt Activity Tolerance: Patient tolerated treatment well Patient left: in chair;with call bell/phone within reach;with chair alarm set;with family/visitor present   PT Visit Diagnosis: Unsteadiness on feet (R26.81)    Time: 1610-9604 PT Time Calculation (min) (ACUTE ONLY): 28 min   Charges:   PT Evaluation $PT Eval Moderate Complexity: Webb, PT Acute Rehabilitation Services Pager: [phone] Office: [phone]

## 2018-04-08 NOTE — Care Management Note (Signed)
Case Management Note  Patient Details  Name: Darryl [name] MRN: [id number] Date of Birth: 1934-08-04  Subjective/Objective:spoke to spouse/dtr about d/c jplans-they agree to home w/hospice services-TC [facility](already had community referral) spoke to office-they will contact the liason for the hospital to eval-await [facility] eval. Patient already has rw,3n1-will need w/c-[facility] will deliver to rm prior d/c. Family will transport home on own.DNR form on shadow chart.                    Action/Plan:d/c home w/hospice.   Expected Discharge Date:  04/08/18               Expected Discharge Plan:     In-House Referral:     Discharge planning Services  CM Consult  Post Acute Care Choice:  Home Health(Active w/[name]) Choice offered to:  Spouse  DME Arranged:  Wheelchair manual DME Agency:  [place]:  RN [facility] Agency:  [facility]  Status of Service:  Completed, signed off  If discussed at [employer] of Stay Meetings, dates discussed:    Additional Comments:  Dessa Phi, RN 04/08/2018, 11:30 AM

## 2018-04-08 NOTE — Progress Notes (Signed)
  Speech Language Pathology Treatment: Dysphagia  Patient Details Name: Darryl [name] MRN: [id number] DOB: [birth year] Today's Date: 04/08/2018 Time: 1050-1120 SLP Time Calculation (min) (ACUTE ONLY): 30 min  Assessment / Plan / Recommendation Clinical Impression  Exercise session to implement RMST to improve swallow and airway protection.  [employer]ics Respiratory Muscle Trainers set clinically by this SLP - due to not having access to pneumatic measurement device.    Pt required minimal cues to conduct expiratory muscle strength training adequately and max cues for inspiration.  Expiratory device set at 16.5, Inspiratory device set at 13.  Coordination of full exhalation prior to forced inhalation was challenging for this pt but pursed lip exhalation appeared helpful.  Pt conducted at least 30 repetitions during session.  Advised overall effort goal is level 7 of 10.  Wife arrived toward end of session and pt demonstrated use of each device with mod I.    Recommend follow up SLP for dysphagia management - if pt able to have services with home hospice.  SLP in-house to sign off as HEP established.  Thanks for this consult.     HPI HPI: 83 yo male adm to [facility] with acute encephalopathy, gait instability- concern for possible CVA.  Pt underwent MRI that was negative for acute stroke.  He has h/o Parkinson's disease- diagnosed x4 years, GERD, chest pain, CAD, CHF, right leg pain.  CXR negative.  Pt had a prior swallow evaluation in May 2019 when in hospital for asp pna and was recommended to have dys2/nectar diet - Wife present reports pt did not consume nectar liquids at home and has been consuming regular/thin diet with good tolerance.  He has lost mild amount of weight over the last 2 months but has not had recurrent pulmonary issues.  Swallow evaluation ordered.       SLP Plan  Continue with current plan of care       Recommendations  Diet recommendations: Regular;Thin liquid Liquids  provided via: Cup;No straw Medication Administration: Whole meds with puree Supervision: Patient able to self feed Compensations: Minimize environmental distractions;Slow rate;Small sips/bites Postural Changes and/or Swallow Maneuvers: Seated upright 90 degrees;Upright 30-60 min after meal                Oral Care Recommendations: Oral care BID Follow up Recommendations: Home health SLP SLP Visit Diagnosis: Dysphagia, pharyngoesophageal phase (R13.14) Plan: Continue with current plan of care       GO                Macario Golds 04/08/2018, 11:29 AM  Luanna Salk, MS [facility] SLP Acute Rehab Services Pager [phone] Office [phone]

## 2018-04-13 ENCOUNTER — Telehealth: Payer: Self-pay | Admitting: Cardiology

## 2018-04-13 NOTE — Telephone Encounter (Signed)
New Message    Patients wife is calling on his behalf. She cancelled his echocardiogram because he just had one at the hospital recently. She also wanted to let Dr. Radford Pax know that he is also turned over to [place].

## 2018-05-16 ENCOUNTER — Other Ambulatory Visit (HOSPITAL_COMMUNITY): Payer: Medicare Other

## 2018-06-28 IMAGING — CT CT HEAD W/O CM
4 series · 16 of 47 positions shown, 18 images · non-contrast
Comparison: MRI 08/26/2013, CT brain 11/30/2003

CLINICAL DATA: Increasing weakness in the lower extremities leading
to increased falls history of Parkinson's

EXAM:
CT HEAD WITHOUT CONTRAST
TECHNIQUE: Contiguous axial images were obtained from the base of the skull
through the vertex without intravenous contrast.

[Series 3: head without · axial · non-contrast · 0.43mm/px · z∈[-120,+5]mm · 7 of 35 slices shown, 9 images]
[im 5/35  brain]
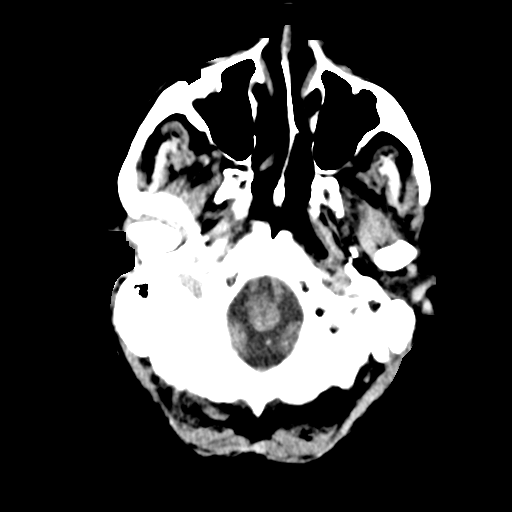
[im 5/35  bone]
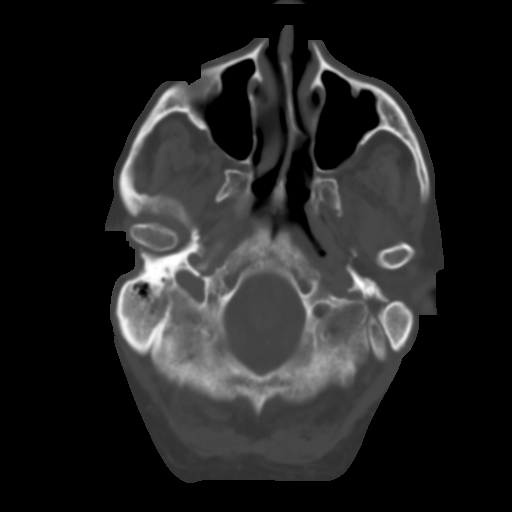
[im 9/35  brain]
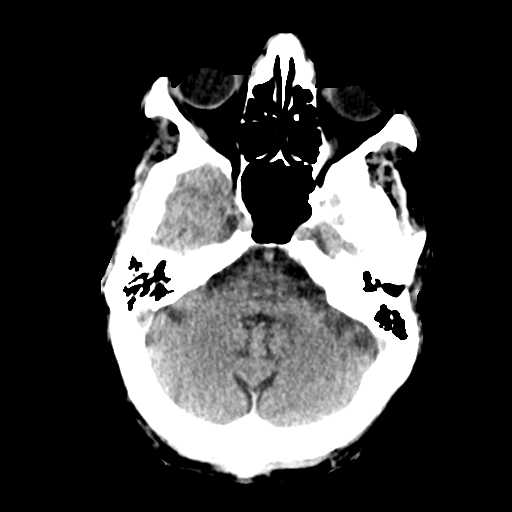
[im 13/35  brain]
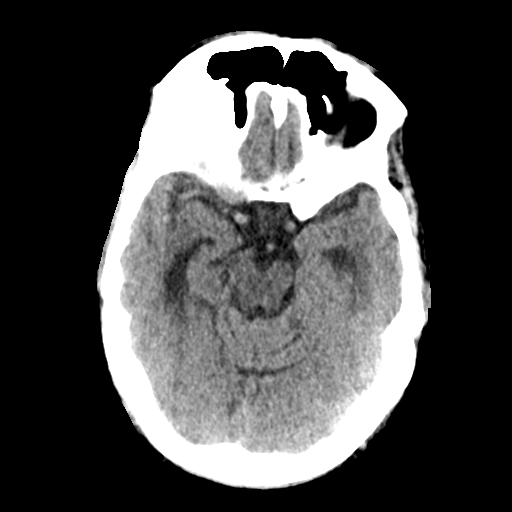
[im 18/35  brain]
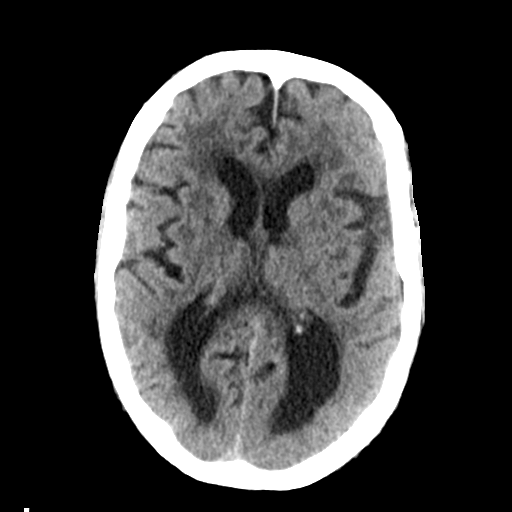
[im 22/35  brain]
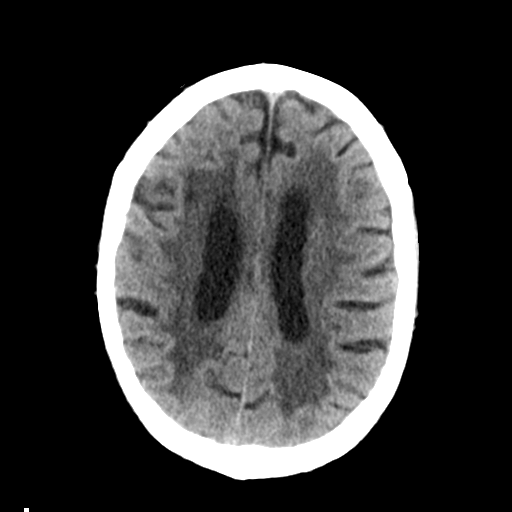
[im 22/35  bone]
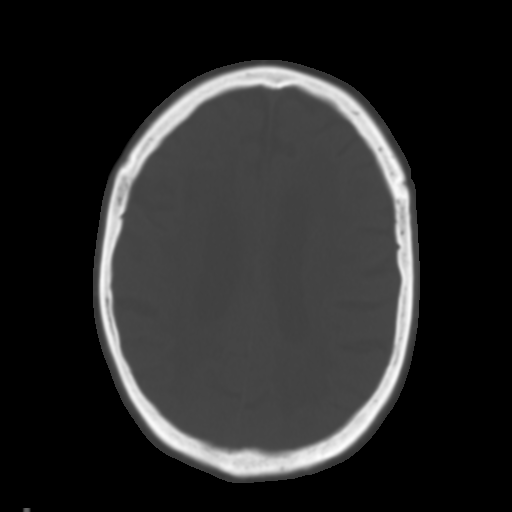
[im 26/35  brain]
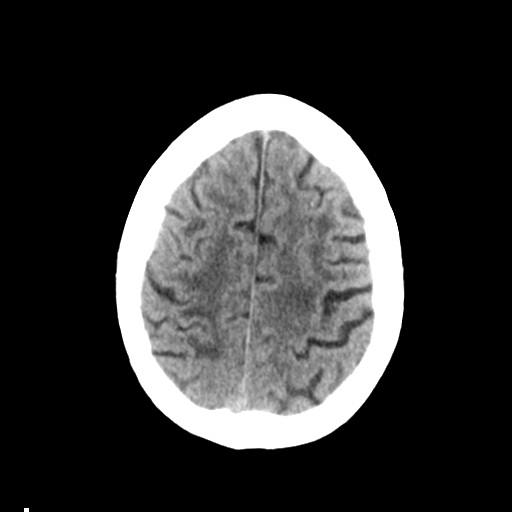
[im 30/35  brain]
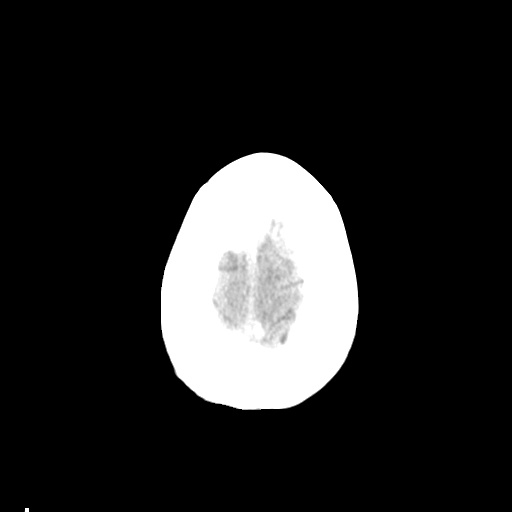

[Series 4: head bone · axial · 0.43mm/px · z∈[-124,-88]mm · 3 of 88 slices shown]
[im 9/88  bone]
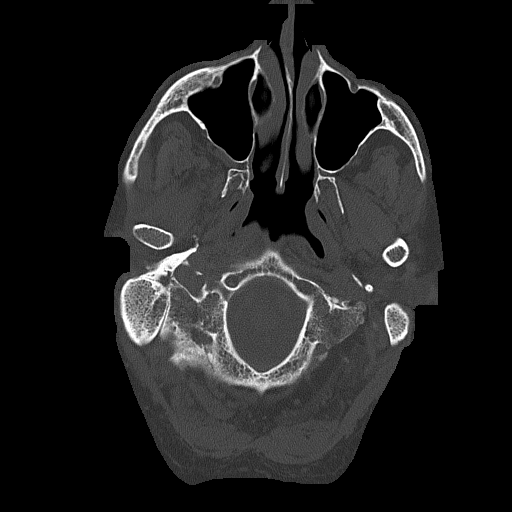
[im 18/88  bone]
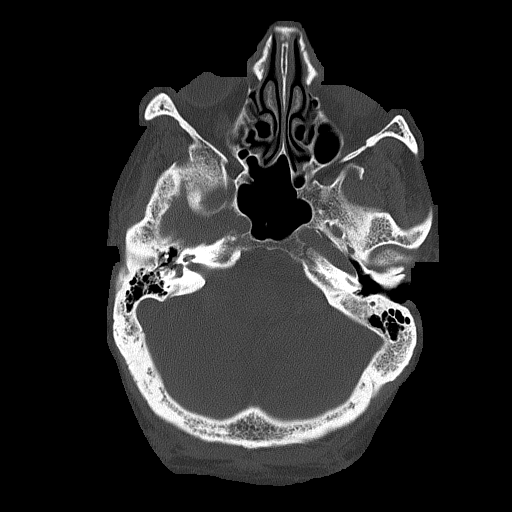
[im 27/88  bone]
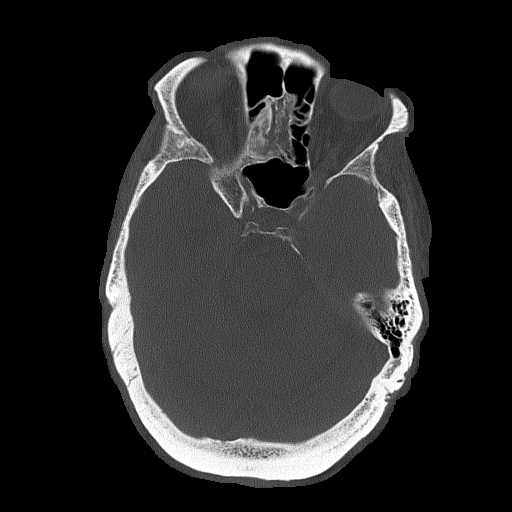

[Series 5: head without cor · coronal · non-contrast · 0.34mm/px · 3 of 75 slices shown]
[im 25/75  brain]
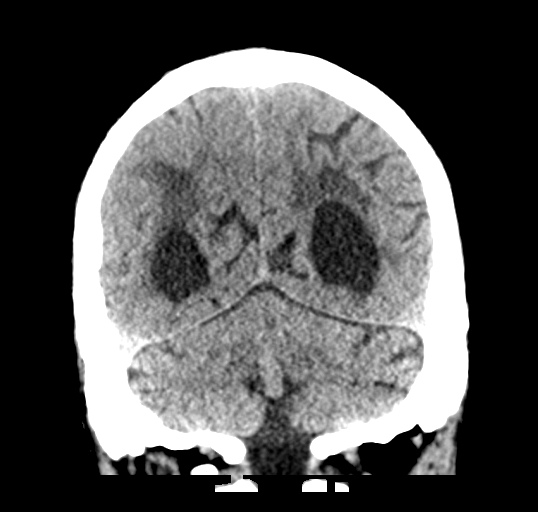
[im 33/75  brain]
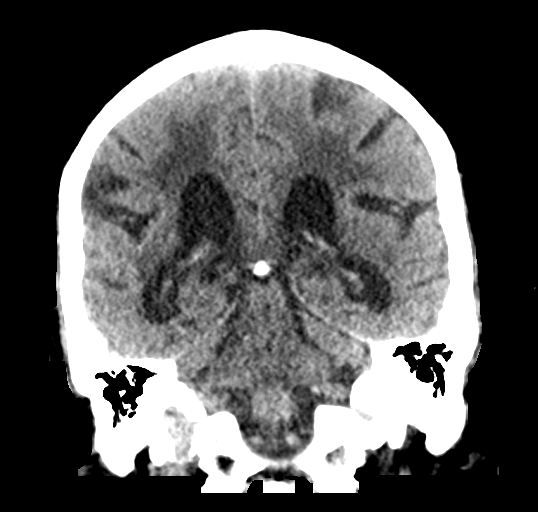
[im 42/75  brain]
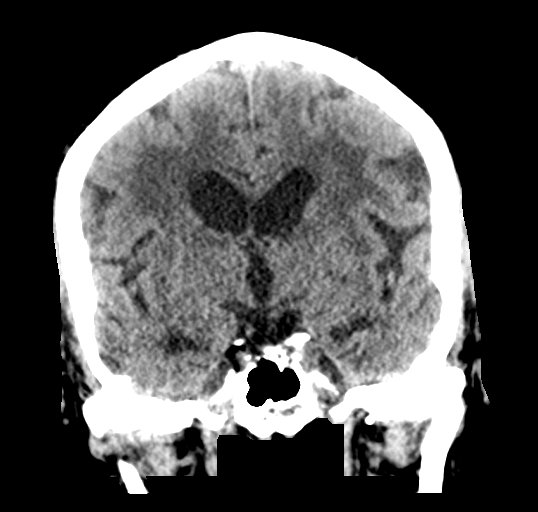

[Series 6: head without sag · sagittal · non-contrast · 0.33mm/px · 3 of 67 slices shown]
[im 23/67  brain]
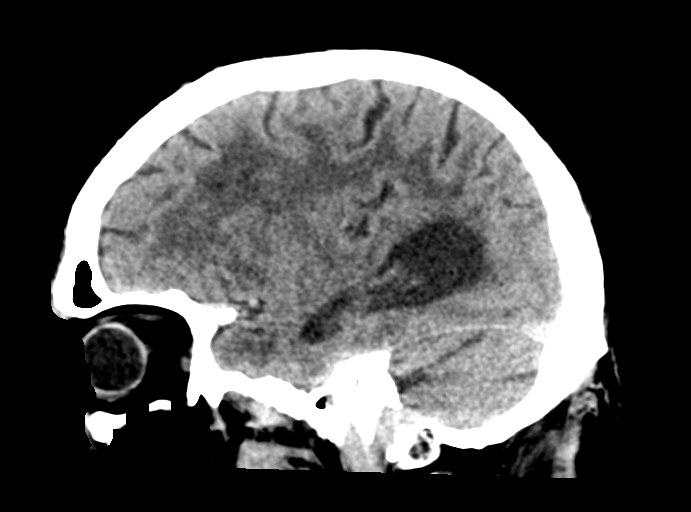
[im 34/67  brain]
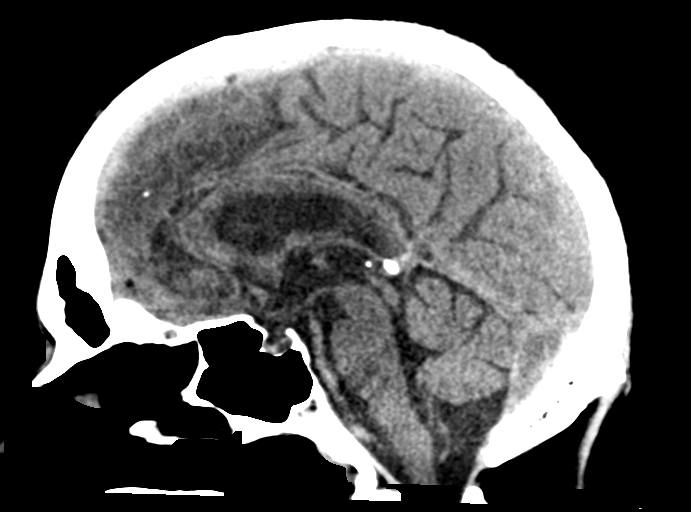
[im 45/67  brain]
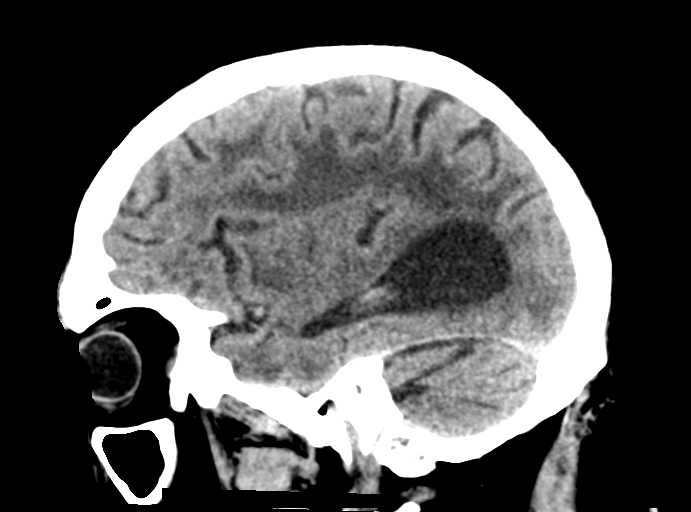

[16 of 47 positions shown; findings below may reference images not displayed]

FINDINGS: Brain: No acute territorial infarction, hemorrhage, or intracranial
mass is visualized. Extensive small vessel ischemic changes of the
white matter. Multifocal hypodensity in the basal ganglia and
thalamus, probable remote lacunar infarcts. Moderate atrophy.
Prominent ventricles likely due to atrophy.

Vascular: No hyperdense vessels.  Carotid vascular calcification

Skull: No fracture

Sinuses/Orbits: Mild mucosal thickening in the ethmoid sinuses. No
acute orbital abnormality.

Other: None
IMPRESSION: 1. No definite CT evidence for acute intracranial abnormality.
2. Fairly extensive small vessel ischemic changes of the white
matter with probable old multifocal lacunar infarcts in the basal
ganglia and thalamus.

## 2018-07-11 ENCOUNTER — Other Ambulatory Visit: Payer: Medicare Other | Admitting: Licensed Clinical Social Worker

## 2018-07-11 DIAGNOSIS — Z515 Encounter for palliative care: Secondary | ICD-10-CM

## 2018-07-11 NOTE — Progress Notes (Signed)
COMMUNITY PALLIATIVE CARE SW NOTE  PATIENT NAME: Darryl [name] DOB: 06-May-1935 MRN: [id number]  PRIMARY CARE PROVIDER: [name], MD  RESPONSIBLE PARTY:  Acct ID - Guarantor Home Phone Work Phone Relationship Acct Type  [id number] PHYLLIP, [phone]  Self P/F     [address], [place], [place] [address]     PLAN OF CARE and INTERVENTIONS:             1. GOALS OF CARE/ ADVANCE CARE PLANNING:  Patient's wife, [name], would like to keep her husband home as long as possible.  Patient is a DNR. 2. SOCIAL/EMOTIONAL/SPIRITUAL ASSESSMENT/ INTERVENTIONS:  SW met with patient and his wife, [name], in their home.  Patient displayed appropriate social niceties.  [name] had several questions about the Palliative Care/[facility] Program.  SW provided education regarding the SW and RN roles and the difference between Hospice and Palliative Care.  She stated she understood.  [name] is considering changing patient's primary MD since getting him to the doctor's office is becoming increasingly difficult.  He has some periods of anger per [name].  He is sleeping well at night and his appetite is good.  SW provided active listening and supportive counseling while [name] discussed her challenges while taking care of patient at home.  The couple has one daughter together that lives in the area.  Patient has three children from a previous marriage that do not live near by.  [name] receives support from [place]. 3. PATIENT/CAREGIVER EDUCATION/ COPING:  Patient and his wife express their feeling openly.  During the visit patient had asked for pain medication. 4. PERSONAL EMERGENCY PLAN:  [name] will contact Dr. Sabra Heck as needed. 5. COMMUNITY RESOURCES COORDINATION/ HEALTH CARE NAVIGATION:  None. 6. FINANCIAL/LEGAL CONCERNS/INTERVENTIONS:  None.     SOCIAL HX:  Social History   Tobacco Use  . Smoking status: Former Smoker    Packs/day: 2.00    Years: 5.00    Pack years: 10.00   Types: Cigarettes    Last attempt to quit: 04/28/1959    Years since quitting: 59.2  . Smokeless tobacco: Never Used  . Tobacco comment: 55+ yrs ago  Substance Use Topics  . Alcohol use: No    CODE STATUS: DNR  ADVANCED DIRECTIVES: N MOST FORM COMPLETE:  N HOSPICE EDUCATION PROVIDED: Patient was recently discharged from Hospice due to a prognosis of greater than six months. PPS:  Patient's appetite is normal.  He can stand independently and uses a walker. Duration of visit and documentation:  60 minutes.      Creola Corn Joua Bake, LCSW

## 2018-07-25 ENCOUNTER — Other Ambulatory Visit: Payer: Medicare Other | Admitting: *Deleted

## 2018-07-25 DIAGNOSIS — Z515 Encounter for palliative care: Secondary | ICD-10-CM

## 2018-07-28 NOTE — Progress Notes (Signed)
COMMUNITY PALLIATIVE CARE RN NOTE  PATIENT NAME: Darryl [name] DOB: Mar 17, 1935 MRN: [id number]  PRIMARY CARE PROVIDER: [name], MD  RESPONSIBLE PARTY: Barbra Sarks (wife) Acct ID - Guarantor Home Phone Work Phone Relationship Acct Type  [id number] [name], [name]  Self P/F     [address], [place], [place] [address]    PLAN OF CARE and INTERVENTION:  1. ADVANCE CARE PLANNING/GOALS OF CARE: Wife wants him to remain at home and avoid hospitalizations. He is a DNR. 2. PATIENT/CAREGIVER EDUCATION: Reinforced Safe Mobility/Transfers and Fall Prevention 3. DISEASE STATUS: PRN visit made as requested by wife. Wife reports that patient had a fall early this am while trying to ambulate with his walker into the living room. She noticed upon him getting up, that he was having some difficulties. He seemed very weak and tired. She had her neighbor to come over to help him off of the floor. He sustained a bruise to his L forearm after hitting it on the wooden lever on the side of the chair. Patient denies any pain. Wife noticed he felt hot this am and stated his Temp was 100.7 temporally. Temp is currently 98.8. He reports feeling better. Lungs clear to ausculation. Urine in urinal slightly dark, but clear without odor. Wife just wanted patient assessed to make sure he seemed ok. Will continue to monitor.   HISTORY OF PRESENT ILLNESS:  This is a 84 yo male who resides at home with his wife. Palliative Care Team to continue to follow patient. Will continue to visit monthly and PRN.  CODE STATUS: DNR ADVANCED DIRECTIVES: N MOST FORM: no PPS: 40%   PHYSICAL EXAM:   VITALS: Today's Vitals   07/25/18 1012  BP: 132/74  Pulse: 69  Resp: 16  Temp: 98.8 F (37.1 C)  TempSrc: Tympanic  SpO2: 95%  PainSc: 0-No pain    LUNGS: clear to auscultation  CARDIAC: Cor RRR EXTREMITIES: No edema SKIN: Thin/frail skin, Bruise noted to L forearm s/p fall this am  NEURO: Alert and  oriented x 2 (person/place), pleasant mood, intermittent confusion, ambulatory with walker   (Duration of visit and documentation 60 minutes)    [name], RN, BSN

## 2018-08-04 IMAGING — CR DG CHEST 2V
2 series · 2 of 2 positions shown · non-contrast
Comparison: 10/08/2017

CLINICAL DATA: Fall

EXAM:
CHEST - 2 VIEW

[chest lat]
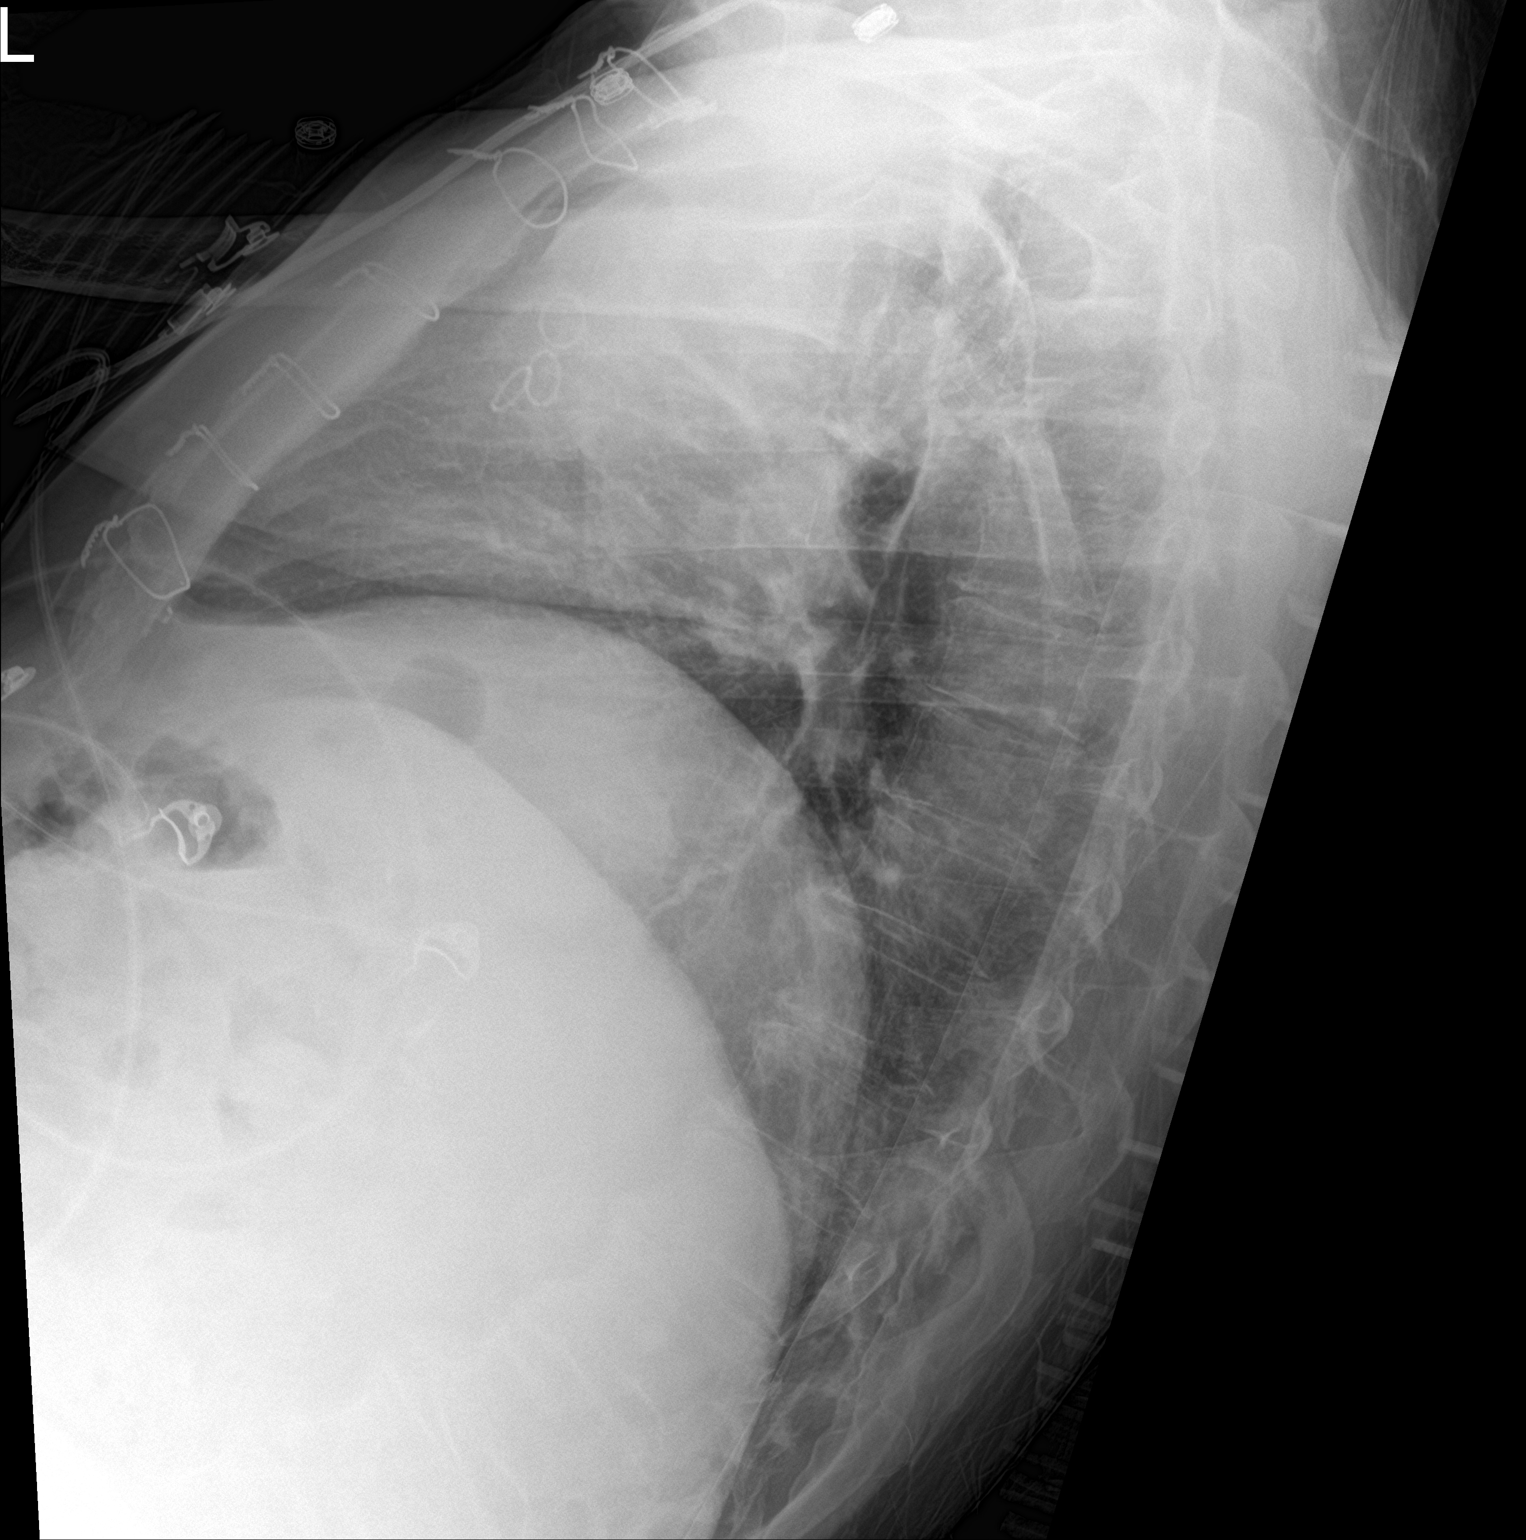

[chest ap]
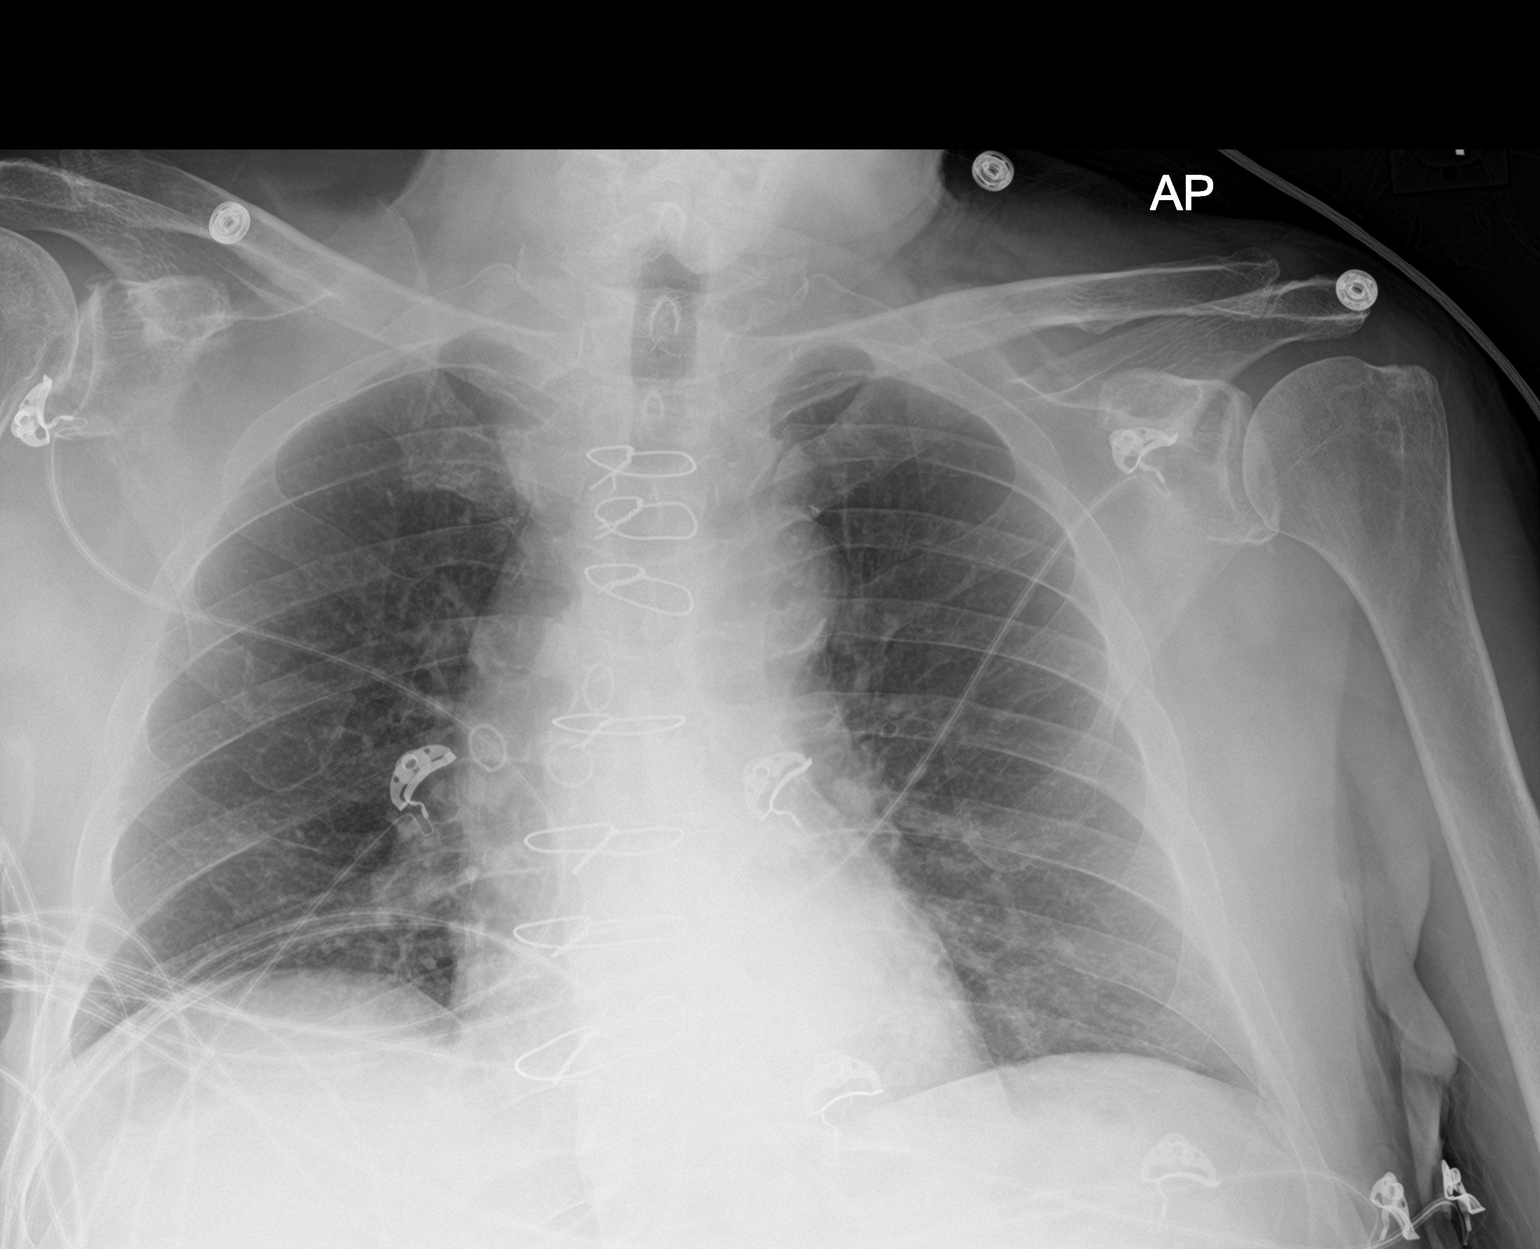

[2 of 2 positions shown; findings below may reference images not displayed]

FINDINGS: Prior CABG. Heart and mediastinal contours are within normal limits.
No focal opacities or effusions. No acute bony abnormality.
IMPRESSION: No active cardiopulmonary disease.

## 2018-08-05 ENCOUNTER — Other Ambulatory Visit: Payer: Medicare Other | Admitting: *Deleted

## 2018-08-05 DIAGNOSIS — Z515 Encounter for palliative care: Secondary | ICD-10-CM

## 2018-08-08 ENCOUNTER — Emergency Department (HOSPITAL_COMMUNITY)
Admission: EM | Admit: 2018-08-08 | Discharge: 2018-08-09 | Disposition: A | Discharge status: Home / Self Care | Payer: Medicare Other | Attending: Emergency Medicine | Admitting: Emergency Medicine

## 2018-08-08 ENCOUNTER — Encounter (HOSPITAL_COMMUNITY): Payer: Self-pay

## 2018-08-08 DIAGNOSIS — F0390 Unspecified dementia without behavioral disturbance: Secondary | ICD-10-CM | POA: Insufficient documentation

## 2018-08-08 DIAGNOSIS — I5032 Chronic diastolic (congestive) heart failure: Secondary | ICD-10-CM | POA: Diagnosis not present

## 2018-08-08 DIAGNOSIS — R339 Retention of urine, unspecified: Secondary | ICD-10-CM

## 2018-08-08 DIAGNOSIS — Z79899 Other long term (current) drug therapy: Secondary | ICD-10-CM | POA: Insufficient documentation

## 2018-08-08 DIAGNOSIS — Z87891 Personal history of nicotine dependence: Secondary | ICD-10-CM | POA: Diagnosis not present

## 2018-08-08 DIAGNOSIS — N390 Urinary tract infection, site not specified: Secondary | ICD-10-CM | POA: Diagnosis not present

## 2018-08-08 DIAGNOSIS — Z9049 Acquired absence of other specified parts of digestive tract: Secondary | ICD-10-CM | POA: Diagnosis not present

## 2018-08-08 DIAGNOSIS — R1030 Lower abdominal pain, unspecified: Secondary | ICD-10-CM | POA: Insufficient documentation

## 2018-08-08 DIAGNOSIS — I130 Hypertensive heart and chronic kidney disease with heart failure and stage 1 through stage 4 chronic kidney disease, or unspecified chronic kidney disease: Secondary | ICD-10-CM | POA: Diagnosis not present

## 2018-08-08 DIAGNOSIS — I2510 Atherosclerotic heart disease of native coronary artery without angina pectoris: Secondary | ICD-10-CM | POA: Diagnosis not present

## 2018-08-08 DIAGNOSIS — N183 Chronic kidney disease, stage 3 (moderate): Secondary | ICD-10-CM | POA: Insufficient documentation

## 2018-08-08 DIAGNOSIS — R350 Frequency of micturition: Secondary | ICD-10-CM | POA: Diagnosis present

## 2018-08-08 DIAGNOSIS — Z8673 Personal history of transient ischemic attack (TIA), and cerebral infarction without residual deficits: Secondary | ICD-10-CM | POA: Insufficient documentation

## 2018-08-08 DIAGNOSIS — Z7982 Long term (current) use of aspirin: Secondary | ICD-10-CM | POA: Insufficient documentation

## 2018-08-08 DIAGNOSIS — Z951 Presence of aortocoronary bypass graft: Secondary | ICD-10-CM | POA: Insufficient documentation

## 2018-08-08 DIAGNOSIS — G20 Parkinson's disease: Secondary | ICD-10-CM | POA: Diagnosis not present

## 2018-08-08 DIAGNOSIS — N200 Calculus of kidney: Secondary | ICD-10-CM | POA: Diagnosis not present

## 2018-08-08 LAB — CBC
HEMATOCRIT: 42.8 % (ref 39.0–52.0)
Hemoglobin: 15.1 g/dL (ref 13.0–17.0)
MCH: 32.5 pg (ref 26.0–34.0)
MCHC: 35.3 g/dL (ref 30.0–36.0)
MCV: 92.2 fL (ref 80.0–100.0)
Platelets: 168 10*3/uL (ref 150–400)
RBC: 4.64 MIL/uL (ref 4.22–5.81)
RDW: 13.6 % (ref 11.5–15.5)
WBC: 16.1 10*3/uL — ABNORMAL HIGH (ref 4.0–10.5)
nRBC: 0 % (ref 0.0–0.2)

## 2018-08-08 LAB — BASIC METABOLIC PANEL
Anion gap: 10 (ref 5–15)
BUN: 25 mg/dL — AB (ref 8–23)
CALCIUM: 9.6 mg/dL (ref 8.9–10.3)
CO2: 23 mmol/L (ref 22–32)
CREATININE: 1.39 mg/dL — AB (ref 0.61–1.24)
Chloride: 107 mmol/L (ref 98–111)
GFR calc non Af Amer: 47 mL/min — ABNORMAL LOW (ref >60)
GFR, EST AFRICAN AMERICAN: 55 mL/min — AB (ref >60)
Glucose, Bld: 155 mg/dL — ABNORMAL HIGH (ref 70–99)
Potassium: 4.1 mmol/L (ref 3.5–5.1)
SODIUM: 140 mmol/L (ref 135–145)

## 2018-08-08 NOTE — ED Triage Notes (Signed)
Pt arrived with complaints of right groin/abdominal pain. Wife states he has had urinary frequency and concerned about a UTI

## 2018-08-08 NOTE — ED Notes (Signed)
Urine culture sent with urinalysis.  

## 2018-08-08 NOTE — ED Provider Notes (Signed)
[facility] DEPT Provider Note  CSN: [id number] Arrival date & time: 08/08/18 2052  Chief Complaint(s) Groin Pain and Urinary Tract Infection  HPI Darryl [name] is a 85 y.o. male extensive past medical history listed below including BPH status post TURP and frequent urinary tract infections who presents to the emergency department with several days of urinary frequency and suprapubic pain.  Remainder of history, ROS, and physical exam limited due to patient's condition (dementia). Additional information was obtained from wife.   Level V Caveat.    HPI  Past Medical History Past Medical History:  Diagnosis Date  . Arthritis   . BPH (benign prostatic hypertrophy) with urinary retention   . Chronic diastolic CHF (congestive heart failure) ([place])   . Colon polyp   . Coronary atherosclerosis of native coronary artery    a. s/p CABG 11/2003. b. s/p DES to radial-OM and SVG-RCA 11/05. c. s/p BMS to LCx 11/2008.  . Dilated aortic root ([place]) 05/29/2014   23mm by echo 04/2017  . Diverticulosis   . Dyslipidemia    statin intolerant  . Elevated fasting glucose   . Gastritis    a. by EGD 04/2004.  . H/O urinary retention    post surgical procedures; has foley cath  . Headache(784.0)   . Hiatal hernia   . HOH (hard of hearing)    slight hoh, rt>l  . HTN (hypertension)   . Hypertensive heart disease   . Insomnia   . Intracranial hemorrhage ([place])    a. small pontine [place] 2003.  [name] LBBB (left bundle branch block)   . Lumbar spondylolysis    multilevel DJD with mild spinal stenosis L3-L5  . Migraines   . Parkinson's disease ([place])   . Peptic ulcer disease    a. prior hx of bleeding ulcers, including 12/2003 in setting of aspirin use.  . Renal insufficiency    renal duplex scan in 2012 showed no renal artery stenosis  . Sleep disturbance   . Stroke [facility])    no clinical manifestations - seen on neuroimaging  . Thrombocytopenia ([place])   . Tremor   .  UTI (urinary tract infection)   . Wears glasses    Patient Active Problem List   Diagnosis Date Noted  . Ataxia 04/07/2018  . Acute metabolic encephalopathy 24/26/8341  . Unstable gait 04/06/2018  . Chest pain 04/06/2018  . CKD (chronic kidney disease) stage 3, GFR 30-59 ml/min (HCC) 04/06/2018  . Aspiration pneumonia ([place]) 11/15/2017  . B12 deficiency 07/13/2016  . Orthostatic hypotension 03/26/2016  . Chronic diastolic heart failure ([place]) 12/02/2015  . CAD (coronary artery disease), native coronary artery 11/28/2014  . BPH (benign prostatic hypertrophy) with urinary retention 11/19/2014  . Diastolic dysfunction 96/22/2979  . UTI (urinary tract infection) 10/07/2014  . Abnormal EKG 10/04/2014  . Urinary retention 10/04/2014  . Benign prostatic hyperplasia 10/04/2014  . UTI (lower urinary tract infection) 10/03/2014  . Sepsis ([place]) 10/03/2014  . Parkinsonism ([place]) 06/12/2014  . Dilated aortic root ([place]) 05/29/2014  . CKD (chronic kidney disease), stage III ([place]) 04/25/2014  . Gastritis   . Peptic ulcer disease   . Stroke ([place])   . Hx of CABG 2005, PCI 2010   . Parkinson's disease ([place])   . Small vessel disease, cerebrovascular 10/30/2013  . Intracerebral hemorrhage ([place]) 10/30/2013  . Tremor 08/14/2013  . Benign essential HTN 04/13/2013  . Dyslipidemia 04/13/2013  . LBBB (left bundle branch block) 04/13/2013  . Preoperative clearance 04/13/2013  .  DJD (degenerative joint disease) of lumbar spine 04/13/2013   Home Medication(s) Prior to Admission medications   Medication Sig Start Date End Date Taking? Authorizing Provider  acetaminophen (TYLENOL) 500 MG tablet Take 1,000 mg by mouth every 8 (eight) hours as needed for mild pain or moderate pain.    [provider]  aspirin EC 81 MG tablet Take 81 mg by mouth. Patient takes 3 times a week    [provider]  Carbidopa-Levodopa ER (SINEMET CR) 25-100 MG tablet controlled release Take 1 tablet by mouth 3  (three) times daily. 12/17/17   Tat, Eustace Quail, DO  carvedilol (COREG) 6.25 MG tablet Take 1 tablet (6.25 mg total) by mouth 2 (two) times daily. 03/23/18   Sueanne Margarita, MD  cephALEXin (KEFLEX) 500 MG capsule Take 1 capsule (500 mg total) by mouth 3 (three) times daily for 7 days. 08/09/18 08/16/18  Fatima Blank, MD  citalopram (CELEXA) 20 MG tablet Take 20 mg by mouth at bedtime.     [provider]  cloNIDine (CATAPRES) 0.2 MG tablet Take 1.5 tablets (0.3 mg total) by mouth 2 (two) times daily. 03/23/18 03/23/19  Sueanne Margarita, MD  furosemide (LASIX) 20 MG tablet Take 1 tablet (20 mg total) by mouth daily. 03/23/18 03/18/19  Sueanne Margarita, MD  hydrALAZINE (APRESOLINE) 25 MG tablet Take 1 tablet (25 mg total) by mouth 3 (three) times daily. 08/31/17 08/26/18  Sueanne Margarita, MD  HYDROcodone-acetaminophen (NORCO/VICODIN) 5-325 MG tablet Take 1-2 tablets by mouth every 4 (four) hours as needed for moderate pain. 04/08/18   Charlynne Cousins, MD  irbesartan (AVAPRO) 300 MG tablet Take 300 mg by mouth at bedtime.     [provider]  KLOR-CON 10 10 MEQ tablet Take 1 tablet (10 mEq total) by mouth daily. 03/23/18 03/18/19  Sueanne Margarita, MD  LORazepam (ATIVAN) 1 MG tablet Take 1 tablet (1 mg total) by mouth 2 (two) times daily as needed for anxiety. 03/20/18   Drenda Freeze, MD  Maltodextrin-Xanthan Gum ([place]) POWD As needed with meals. Patient not taking: Reported on 04/06/2018 11/17/17   Hosie Poisson, MD  nitroGLYCERIN (NITROSTAT) 0.4 MG SL tablet Place 1 tablet (0.4 mg total) under the tongue every 5 (five) minutes as needed for chest pain. 08/31/17   Sueanne Margarita, MD  pantoprazole (PROTONIX) 40 MG tablet Take 40 mg by mouth 3 (three) times a week.     [provider]  tamsulosin (FLOMAX) 0.4 MG CAPS capsule Take 0.4 mg by mouth daily. 02/01/18   [provider]  traZODone (DESYREL) 50 MG tablet TAKE 1 TABLET BY MOUTH EVERYDAY AT  BEDTIME Patient taking differently: Take 25 mg by mouth at bedtime.  02/14/18   TatEustace Quail, DO  Past Surgical History Past Surgical History:  Procedure Laterality Date  . APPENDECTOMY  62   1963  . CATARACT EXTRACTION, BILATERAL    . CHOLECYSTECTOMY  1998  . COLON SURGERY  03/14/2008  . COLONOSCOPY  10/2001  . CORONARY ANGIOPLASTY  6/10,10/05   Dr. [name] cardiologist: last office visit 11/07/14  . CORONARY ARTERY BYPASS GRAFT  2005  . CYSTOSCOPY WITH LITHOLAPAXY N/A 11/19/2014   Procedure: CYSTOSCOPY WITH LITHOLAPAXY;  Surgeon: [name], MD;  [facility];  Service: Urology;  Laterality: N/A;  . ESOPHAGOGASTRODUODENOSCOPY     10/2001, 12/2003, 04/2004  . LUMBAR LAMINECTOMY/DECOMPRESSION MICRODISCECTOMY N/A 05/03/2013   Procedure: LUMBAR LAMINECTOMY/DCOMPRESSION MICRODISCECTOMY RIGHT LUMBAR FOUR FIVE;  Surgeon: [name], MD;  Location: [facility];  Service: Neurosurgery;  Laterality: N/A;  . SHOULDER ARTHROSCOPY Left 9/10  . stones     hx  . TRANSURETHRAL RESECTION OF PROSTATE N/A 11/19/2014   Procedure: TRANSURETHRAL RESECTION OF THE PROSTATE WITH GYRUS INSTRUMENTS;  Surgeon: [name], MD;  Location: [facility];  Service: Urology;  Laterality: N/A;  . WISDOM TOOTH EXTRACTION     Family History Family History  Problem Relation Age of Onset  . CAD Father 66       ASCAD  . CAD Sister   . Diabetes Child     Social History Social History   Tobacco Use  . Smoking status: Former Smoker    Packs/day: 2.00    Years: 5.00    Pack years: 10.00    Types: Cigarettes    Last attempt to quit: 04/28/1959    Years since quitting: 59.3  . Smokeless tobacco: Never Used  . Tobacco comment: 55+ yrs ago  Substance Use Topics  . Alcohol use: No  . Drug use: No   Allergies Ambien [zolpidem  tartrate]; Aspirin; Celebrex [celecoxib]; Nsaids; Toprol xl [metoprolol tartrate]; Altace [ramipril]; Amlodipine; Levothyroxine; Biaxin [clarithromycin]; and Tetracyclines & related  Review of Systems Review of Systems  Unable to perform ROS: Dementia    Physical Exam Vital Signs  I have reviewed the triage vital signs BP (!) 159/94   Pulse 71   Temp 98.8 F (37.1 C) (Oral)   Resp 18   Ht 5\' 10"  (1.778 m)   Wt 80.3 kg   SpO2 98%   BMI 25.40 kg/m   Physical Exam Vitals signs reviewed.  Constitutional:      General: He is not in acute distress.    Appearance: He is well-developed. He is not diaphoretic.  HENT:     Head: Normocephalic and atraumatic.     Jaw: No trismus.     Right Ear: External ear normal.     Left Ear: External ear normal.     Nose: Nose normal.  Eyes:     General: No scleral icterus.    Conjunctiva/sclera: Conjunctivae normal.  Neck:     Musculoskeletal: Normal range of motion.     Trachea: Phonation normal.  Cardiovascular:     Rate and Rhythm: Normal rate and regular rhythm.  Pulmonary:     Effort: Pulmonary effort is normal. No respiratory distress.     Breath sounds: No stridor.  Abdominal:     General: There is no distension.     Tenderness: There is abdominal tenderness in the suprapubic area and left lower quadrant.  Genitourinary:    Penis: Uncircumcised. Erythema present.      Comments: Irritated glands Musculoskeletal: Normal range of motion.  Neurological:  Mental Status: He is alert and oriented to person, place, and time.  Psychiatric:        Behavior: Behavior normal.     ED Results and Treatments Labs (all labs ordered are listed, but only abnormal results are displayed) Labs Reviewed  URINALYSIS, ROUTINE W REFLEX MICROSCOPIC - Abnormal; Notable for the following components:      Result Value   APPearance HAZY (*)    Hgb urine dipstick SMALL (*)    Leukocytes, UA SMALL (*)    Bacteria, UA RARE (*)    All other  components within normal limits  CBC - Abnormal; Notable for the following components:   WBC 16.1 (*)    All other components within normal limits  BASIC METABOLIC PANEL - Abnormal; Notable for the following components:   Glucose, Bld 155 (*)    BUN 25 (*)    Creatinine, Ser 1.39 (*)    GFR calc non Af Amer 47 (*)    GFR calc Af Amer 55 (*)    All other components within normal limits                                                                                                                         EKG  EKG Interpretation  Date/Time:    Ventricular Rate:    PR Interval:    QRS Duration:   QT Interval:    QTC Calculation:   R Axis:     Text Interpretation:        Radiology Ct Abdomen Pelvis W Contrast  Result Date: 08/09/2018 CLINICAL DATA:  Right-sided abdominal pain with elevated white blood cell count EXAM: CT ABDOMEN AND PELVIS WITH CONTRAST TECHNIQUE: Multidetector CT imaging of the abdomen and pelvis was performed using the standard protocol following bolus administration of intravenous contrast. CONTRAST:  135mL ISOVUE-300 IOPAMIDOL (ISOVUE-300) INJECTION 61% COMPARISON:  11/14/17 FINDINGS: Lower chest: Lung bases are free of acute infiltrate or sizable effusion. A few tiny stable nodules are noted from prior exam of 11/14/2017 Hepatobiliary: Gallbladder is been surgically removed. The liver is within normal limits. Pancreas: Unremarkable. No pancreatic ductal dilatation or surrounding inflammatory changes. Spleen: Normal in size without focal abnormality. Adrenals/Urinary Tract: Adrenal glands are within normal limits bilaterally. Nonobstructing left lower pole renal stone is noted measuring 7 mm. The collecting systems and ureters are prominent although no obstructing lesions are seen. The bladder is well distended likely contributing to the fullness bilaterally. Prior TURP defect is noted within the prostate. Stomach/Bowel: The appendix has been surgically removed.  Diverticular change of the colon is noted without evidence of diverticulitis. Small bowel is within normal limits with the exception of a prominent duodenal diverticulum adjacent to the head of the pancreas. Distal stomach demonstrates mild edematous changes consistent with the given clinical history of gastritis. No definitive ulceration is noted. Vascular/Lymphatic: Aortic atherosclerosis. No enlarged abdominal or pelvic lymph nodes. Reproductive: TURP defect is noted within the prostate. Other: No abdominal wall hernia  or abnormality. No abdominopelvic ascites. Musculoskeletal: Degenerative changes of lumbar spine are noted IMPRESSION: Nonobstructing left renal stone is noted. Fullness of the collecting systems is noted bilaterally related to the distended bladder. Diverticular change without evidence of diverticulitis. Electronically Signed   By: Inez Catalina M.D.   On: 08/09/2018 02:38   Pertinent labs & imaging results that were available during my care of the patient were reviewed by me and considered in my medical decision making (see chart for details).  Medications Ordered in ED Medications  cephALEXin (KEFLEX) capsule 500 mg (has no administration in time range)  iopamidol (ISOVUE-300) 61 % injection 100 mL (100 mLs Intravenous Contrast Given 08/09/18 0204)                                                                                                                                    Procedures Procedures  (including critical care time)  Medical Decision Making / ED Course I have reviewed the nursing notes for this encounter and the patient's prior records (if available in EHR or on provided paperwork).    Patient presents with urinary frequency and suprapubic pain.  Noted to have over 600 cc in the bladder.  Labs notable for leukocytosis and evidence of possible urinary tract infection.  Given the significant leukocytosis, CT scan was obtained to rule out other intra-abdominal  inflammatory/infectious process.  The CT scan was negative.  Indwelling catheter was inserted.  On chart review, prior urine cultures grew out Corynebacterium.  Will treat with Keflex.  Recommended urology follow-up.    Final Clinical Impression(s) / ED Diagnoses Final diagnoses:  Lower urinary tract infectious disease  Urinary retention    Disposition: Discharge  Condition: Good  I have discussed the results, Dx and Tx plan with the patient who expressed understanding and agree(s) with the plan. Discharge instructions discussed at great length. The patient was given strict return precautions who verbalized understanding of the instructions. No further questions at time of discharge.    ED Discharge Orders         Ordered    cephALEXin (KEFLEX) 500 MG capsule  3 times daily     08/09/18 0359           Follow Up: [name], MD [place] [address] [phone]     [place] [phone]       This chart was dictated using voice recognition software.  Despite best efforts to proofread,  errors can occur which can change the documentation meaning.   Fatima Blank, MD 08/09/18 [phone]

## 2018-08-09 ENCOUNTER — Encounter (HOSPITAL_COMMUNITY): Payer: Self-pay

## 2018-08-09 ENCOUNTER — Emergency Department (HOSPITAL_COMMUNITY): Payer: Medicare Other

## 2018-08-09 DIAGNOSIS — F0390 Unspecified dementia without behavioral disturbance: Secondary | ICD-10-CM | POA: Diagnosis not present

## 2018-08-09 DIAGNOSIS — N200 Calculus of kidney: Secondary | ICD-10-CM | POA: Diagnosis not present

## 2018-08-09 DIAGNOSIS — R451 Restlessness and agitation: Secondary | ICD-10-CM | POA: Diagnosis not present

## 2018-08-09 DIAGNOSIS — N390 Urinary tract infection, site not specified: Secondary | ICD-10-CM | POA: Diagnosis not present

## 2018-08-09 DIAGNOSIS — H6123 Impacted cerumen, bilateral: Secondary | ICD-10-CM | POA: Diagnosis not present

## 2018-08-09 DIAGNOSIS — I129 Hypertensive chronic kidney disease with stage 1 through stage 4 chronic kidney disease, or unspecified chronic kidney disease: Secondary | ICD-10-CM | POA: Diagnosis not present

## 2018-08-09 LAB — URINALYSIS, ROUTINE W REFLEX MICROSCOPIC
BILIRUBIN URINE: NEGATIVE
Glucose, UA: NEGATIVE mg/dL
Ketones, ur: NEGATIVE mg/dL
Nitrite: NEGATIVE
PROTEIN: NEGATIVE mg/dL
Specific Gravity, Urine: 1.015 (ref 1.005–1.030)
pH: 6 (ref 5.0–8.0)

## 2018-08-09 MED ORDER — IOPAMIDOL (ISOVUE-300) INJECTION 61%
100.0000 mL | Freq: Once | INTRAVENOUS | Status: AC | PRN
  Administered 2018-08-09: 100 mL via INTRAVENOUS

## 2018-08-09 MED ORDER — CEPHALEXIN 500 MG PO CAPS
500.0000 mg | ORAL_CAPSULE | Freq: Once | ORAL | Status: AC
  Administered 2018-08-09: 500 mg via ORAL
  Filled 2018-08-09: qty 1

## 2018-08-09 MED ORDER — CEPHALEXIN 500 MG PO CAPS: 500.0000 mg | ORAL_CAPSULE | Freq: Three times a day (TID) | ORAL | 0 refills | Status: AC
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
## 2018-08-10 ENCOUNTER — Telehealth: Payer: Self-pay | Admitting: Primary Care

## 2018-08-10 DIAGNOSIS — G3183 Dementia with Lewy bodies: Secondary | ICD-10-CM | POA: Diagnosis not present

## 2018-08-10 DIAGNOSIS — H6120 Impacted cerumen, unspecified ear: Secondary | ICD-10-CM | POA: Diagnosis not present

## 2018-08-10 DIAGNOSIS — I129 Hypertensive chronic kidney disease with stage 1 through stage 4 chronic kidney disease, or unspecified chronic kidney disease: Secondary | ICD-10-CM | POA: Diagnosis not present

## 2018-08-10 DIAGNOSIS — N390 Urinary tract infection, site not specified: Secondary | ICD-10-CM | POA: Diagnosis not present

## 2018-08-10 NOTE — Telephone Encounter (Signed)
T/c to offer home visit to assess medications and recent change in behavior. Wife states pt was seen in ED for urinary retention and how has a foley. He is doing better now.  He will f/u with urology on Tuesday and home RN will come on Wed. They will let RN know if a NP hv is needed.

## 2018-08-16 DIAGNOSIS — R338 Other retention of urine: Secondary | ICD-10-CM | POA: Diagnosis not present

## 2018-08-17 ENCOUNTER — Other Ambulatory Visit: Payer: Medicare Other | Admitting: Licensed Clinical Social Worker

## 2018-08-17 ENCOUNTER — Other Ambulatory Visit: Payer: Medicare Other | Admitting: *Deleted

## 2018-08-17 DIAGNOSIS — R338 Other retention of urine: Secondary | ICD-10-CM | POA: Diagnosis not present

## 2018-08-17 DIAGNOSIS — N200 Calculus of kidney: Secondary | ICD-10-CM | POA: Diagnosis not present

## 2018-08-18 ENCOUNTER — Other Ambulatory Visit: Payer: Medicare Other | Admitting: *Deleted

## 2018-08-18 ENCOUNTER — Other Ambulatory Visit: Payer: Medicare Other | Admitting: Licensed Clinical Social Worker

## 2018-08-18 DIAGNOSIS — Z515 Encounter for palliative care: Secondary | ICD-10-CM

## 2018-08-19 DIAGNOSIS — R338 Other retention of urine: Secondary | ICD-10-CM | POA: Diagnosis not present

## 2018-08-19 DIAGNOSIS — N200 Calculus of kidney: Secondary | ICD-10-CM | POA: Diagnosis not present

## 2018-08-19 DIAGNOSIS — B3742 Candidal balanitis: Secondary | ICD-10-CM | POA: Diagnosis not present

## 2018-08-21 ENCOUNTER — Other Ambulatory Visit: Payer: Self-pay

## 2018-08-21 ENCOUNTER — Encounter (HOSPITAL_COMMUNITY): Payer: Self-pay | Admitting: Emergency Medicine

## 2018-08-21 ENCOUNTER — Emergency Department (HOSPITAL_COMMUNITY): Payer: Medicare Other

## 2018-08-21 ENCOUNTER — Inpatient Hospital Stay (HOSPITAL_COMMUNITY)
Admission: EM | Admit: 2018-08-21 | Discharge: 2018-08-23 | DRG: 871 | Disposition: A | Discharge status: Hospice | Payer: Medicare Other | Attending: Student | Admitting: Student

## 2018-08-21 DIAGNOSIS — F339 Major depressive disorder, recurrent, unspecified: Secondary | ICD-10-CM | POA: Diagnosis not present

## 2018-08-21 DIAGNOSIS — M48061 Spinal stenosis, lumbar region without neurogenic claudication: Secondary | ICD-10-CM | POA: Diagnosis present

## 2018-08-21 DIAGNOSIS — E872 Acidosis: Secondary | ICD-10-CM | POA: Diagnosis present

## 2018-08-21 DIAGNOSIS — N183 Chronic kidney disease, stage 3 unspecified: Secondary | ICD-10-CM | POA: Diagnosis present

## 2018-08-21 DIAGNOSIS — K5909 Other constipation: Secondary | ICD-10-CM | POA: Diagnosis present

## 2018-08-21 DIAGNOSIS — Z515 Encounter for palliative care: Secondary | ICD-10-CM | POA: Diagnosis not present

## 2018-08-21 DIAGNOSIS — R509 Fever, unspecified: Secondary | ICD-10-CM | POA: Diagnosis not present

## 2018-08-21 DIAGNOSIS — R109 Unspecified abdominal pain: Secondary | ICD-10-CM | POA: Diagnosis not present

## 2018-08-21 DIAGNOSIS — Z886 Allergy status to analgesic agent status: Secondary | ICD-10-CM

## 2018-08-21 DIAGNOSIS — K921 Melena: Secondary | ICD-10-CM | POA: Diagnosis present

## 2018-08-21 DIAGNOSIS — Z955 Presence of coronary angioplasty implant and graft: Secondary | ICD-10-CM

## 2018-08-21 DIAGNOSIS — I509 Heart failure, unspecified: Secondary | ICD-10-CM | POA: Diagnosis not present

## 2018-08-21 DIAGNOSIS — R0789 Other chest pain: Secondary | ICD-10-CM | POA: Diagnosis not present

## 2018-08-21 DIAGNOSIS — I10 Essential (primary) hypertension: Secondary | ICD-10-CM | POA: Diagnosis not present

## 2018-08-21 DIAGNOSIS — N401 Enlarged prostate with lower urinary tract symptoms: Secondary | ICD-10-CM | POA: Diagnosis present

## 2018-08-21 DIAGNOSIS — R404 Transient alteration of awareness: Secondary | ICD-10-CM | POA: Diagnosis not present

## 2018-08-21 DIAGNOSIS — J189 Pneumonia, unspecified organism: Secondary | ICD-10-CM

## 2018-08-21 DIAGNOSIS — Z8719 Personal history of other diseases of the digestive system: Secondary | ICD-10-CM

## 2018-08-21 DIAGNOSIS — H9190 Unspecified hearing loss, unspecified ear: Secondary | ICD-10-CM | POA: Diagnosis present

## 2018-08-21 DIAGNOSIS — R338 Other retention of urine: Secondary | ICD-10-CM | POA: Diagnosis present

## 2018-08-21 DIAGNOSIS — Z8711 Personal history of peptic ulcer disease: Secondary | ICD-10-CM

## 2018-08-21 DIAGNOSIS — A419 Sepsis, unspecified organism: Secondary | ICD-10-CM | POA: Diagnosis present

## 2018-08-21 DIAGNOSIS — I2510 Atherosclerotic heart disease of native coronary artery without angina pectoris: Secondary | ICD-10-CM | POA: Diagnosis present

## 2018-08-21 DIAGNOSIS — F0280 Dementia in other diseases classified elsewhere without behavioral disturbance: Secondary | ICD-10-CM | POA: Diagnosis present

## 2018-08-21 DIAGNOSIS — D500 Iron deficiency anemia secondary to blood loss (chronic): Secondary | ICD-10-CM | POA: Diagnosis present

## 2018-08-21 DIAGNOSIS — J181 Lobar pneumonia, unspecified organism: Secondary | ICD-10-CM | POA: Diagnosis present

## 2018-08-21 DIAGNOSIS — M4306 Spondylolysis, lumbar region: Secondary | ICD-10-CM | POA: Diagnosis present

## 2018-08-21 DIAGNOSIS — G20 Parkinson's disease: Secondary | ICD-10-CM | POA: Diagnosis present

## 2018-08-21 DIAGNOSIS — K219 Gastro-esophageal reflux disease without esophagitis: Secondary | ICD-10-CM | POA: Diagnosis not present

## 2018-08-21 DIAGNOSIS — Z951 Presence of aortocoronary bypass graft: Secondary | ICD-10-CM

## 2018-08-21 DIAGNOSIS — R000 Tachycardia, unspecified: Secondary | ICD-10-CM | POA: Diagnosis not present

## 2018-08-21 DIAGNOSIS — D62 Acute posthemorrhagic anemia: Secondary | ICD-10-CM | POA: Diagnosis not present

## 2018-08-21 DIAGNOSIS — Z87891 Personal history of nicotine dependence: Secondary | ICD-10-CM

## 2018-08-21 DIAGNOSIS — K2970 Gastritis, unspecified, without bleeding: Secondary | ICD-10-CM | POA: Diagnosis present

## 2018-08-21 DIAGNOSIS — G9341 Metabolic encephalopathy: Secondary | ICD-10-CM | POA: Diagnosis present

## 2018-08-21 DIAGNOSIS — Z888 Allergy status to other drugs, medicaments and biological substances status: Secondary | ICD-10-CM

## 2018-08-21 DIAGNOSIS — E785 Hyperlipidemia, unspecified: Secondary | ICD-10-CM | POA: Diagnosis present

## 2018-08-21 DIAGNOSIS — R339 Retention of urine, unspecified: Secondary | ICD-10-CM | POA: Diagnosis not present

## 2018-08-21 DIAGNOSIS — N200 Calculus of kidney: Secondary | ICD-10-CM | POA: Diagnosis not present

## 2018-08-21 DIAGNOSIS — I77810 Thoracic aortic ectasia: Secondary | ICD-10-CM | POA: Diagnosis present

## 2018-08-21 DIAGNOSIS — A403 Sepsis due to Streptococcus pneumoniae: Secondary | ICD-10-CM | POA: Diagnosis not present

## 2018-08-21 DIAGNOSIS — I679 Cerebrovascular disease, unspecified: Secondary | ICD-10-CM | POA: Diagnosis not present

## 2018-08-21 DIAGNOSIS — G309 Alzheimer's disease, unspecified: Secondary | ICD-10-CM | POA: Diagnosis not present

## 2018-08-21 DIAGNOSIS — Z8673 Personal history of transient ischemic attack (TIA), and cerebral infarction without residual deficits: Secondary | ICD-10-CM

## 2018-08-21 DIAGNOSIS — K279 Peptic ulcer, site unspecified, unspecified as acute or chronic, without hemorrhage or perforation: Secondary | ICD-10-CM | POA: Diagnosis present

## 2018-08-21 DIAGNOSIS — F0390 Unspecified dementia without behavioral disturbance: Secondary | ICD-10-CM | POA: Diagnosis present

## 2018-08-21 DIAGNOSIS — I447 Left bundle-branch block, unspecified: Secondary | ICD-10-CM | POA: Diagnosis present

## 2018-08-21 DIAGNOSIS — F0391 Unspecified dementia with behavioral disturbance: Secondary | ICD-10-CM | POA: Diagnosis not present

## 2018-08-21 DIAGNOSIS — I5032 Chronic diastolic (congestive) heart failure: Secondary | ICD-10-CM | POA: Diagnosis present

## 2018-08-21 DIAGNOSIS — I130 Hypertensive heart and chronic kidney disease with heart failure and stage 1 through stage 4 chronic kidney disease, or unspecified chronic kidney disease: Secondary | ICD-10-CM | POA: Diagnosis present

## 2018-08-21 DIAGNOSIS — Z87442 Personal history of urinary calculi: Secondary | ICD-10-CM

## 2018-08-21 DIAGNOSIS — Z79899 Other long term (current) drug therapy: Secondary | ICD-10-CM

## 2018-08-21 DIAGNOSIS — R410 Disorientation, unspecified: Secondary | ICD-10-CM | POA: Diagnosis not present

## 2018-08-21 DIAGNOSIS — N390 Urinary tract infection, site not specified: Secondary | ICD-10-CM | POA: Diagnosis present

## 2018-08-21 DIAGNOSIS — Z8249 Family history of ischemic heart disease and other diseases of the circulatory system: Secondary | ICD-10-CM

## 2018-08-21 DIAGNOSIS — Z7982 Long term (current) use of aspirin: Secondary | ICD-10-CM

## 2018-08-21 DIAGNOSIS — Z9049 Acquired absence of other specified parts of digestive tract: Secondary | ICD-10-CM

## 2018-08-21 DIAGNOSIS — Z79891 Long term (current) use of opiate analgesic: Secondary | ICD-10-CM

## 2018-08-21 DIAGNOSIS — Z833 Family history of diabetes mellitus: Secondary | ICD-10-CM

## 2018-08-21 DIAGNOSIS — Z9079 Acquired absence of other genital organ(s): Secondary | ICD-10-CM

## 2018-08-21 DIAGNOSIS — Z66 Do not resuscitate: Secondary | ICD-10-CM | POA: Diagnosis present

## 2018-08-21 DIAGNOSIS — Z87440 Personal history of urinary (tract) infections: Secondary | ICD-10-CM

## 2018-08-21 LAB — URINALYSIS, ROUTINE W REFLEX MICROSCOPIC
BILIRUBIN URINE: NEGATIVE
Glucose, UA: NEGATIVE mg/dL
Ketones, ur: NEGATIVE mg/dL
Nitrite: NEGATIVE
PROTEIN: 30 mg/dL — AB
Specific Gravity, Urine: 1.018 (ref 1.005–1.030)
pH: 6 (ref 5.0–8.0)

## 2018-08-21 LAB — COMPREHENSIVE METABOLIC PANEL
ALBUMIN: 3.4 g/dL — AB (ref 3.5–5.0)
ALK PHOS: 89 U/L (ref 38–126)
ALT: 19 U/L (ref 0–44)
AST: 20 U/L (ref 15–41)
Anion gap: 6 (ref 5–15)
BILIRUBIN TOTAL: 0.7 mg/dL (ref 0.3–1.2)
BUN: 28 mg/dL — ABNORMAL HIGH (ref 8–23)
CALCIUM: 8.7 mg/dL — AB (ref 8.9–10.3)
CO2: 23 mmol/L (ref 22–32)
Chloride: 112 mmol/L — ABNORMAL HIGH (ref 98–111)
Creatinine, Ser: 1.26 mg/dL — ABNORMAL HIGH (ref 0.61–1.24)
GFR calc Af Amer: >60 mL/min (ref >60)
GFR calc non Af Amer: 53 mL/min — ABNORMAL LOW (ref >60)
Glucose, Bld: 156 mg/dL — ABNORMAL HIGH (ref 70–99)
Potassium: 3.3 mmol/L — ABNORMAL LOW (ref 3.5–5.1)
Sodium: 141 mmol/L (ref 135–145)
TOTAL PROTEIN: 6.5 g/dL (ref 6.5–8.1)

## 2018-08-21 LAB — PROTIME-INR
INR: 1.02
Prothrombin Time: 13.3 seconds (ref 11.4–15.2)

## 2018-08-21 LAB — CBC WITH DIFFERENTIAL/PLATELET
Abs Immature Granulocytes: 0.21 10*3/uL — ABNORMAL HIGH (ref 0.00–0.07)
BASOS ABS: 0 10*3/uL (ref 0.0–0.1)
Basophils Relative: 0 %
EOS ABS: 0 10*3/uL (ref 0.0–0.5)
EOS PCT: 0 %
HCT: 31.4 % — ABNORMAL LOW (ref 39.0–52.0)
Hemoglobin: 11 g/dL — ABNORMAL LOW (ref 13.0–17.0)
Immature Granulocytes: 2 %
LYMPHS ABS: 0.5 10*3/uL — AB (ref 0.7–4.0)
Lymphocytes Relative: 6 %
MCH: 32.4 pg (ref 26.0–34.0)
MCHC: 35 g/dL (ref 30.0–36.0)
MCV: 92.4 fL (ref 80.0–100.0)
MONOS PCT: 9 %
Monocytes Absolute: 0.8 10*3/uL (ref 0.1–1.0)
NRBC: 0 % (ref 0.0–0.2)
Neutro Abs: 7.3 10*3/uL (ref 1.7–7.7)
Neutrophils Relative %: 83 %
Platelets: 127 10*3/uL — ABNORMAL LOW (ref 150–400)
RBC: 3.4 MIL/uL — ABNORMAL LOW (ref 4.22–5.81)
RDW: 13.6 % (ref 11.5–15.5)
WBC: 8.9 10*3/uL (ref 4.0–10.5)

## 2018-08-21 LAB — LIPASE, BLOOD: Lipase: 35 U/L (ref 11–51)

## 2018-08-21 LAB — LACTIC ACID, PLASMA: Lactic Acid, Venous: 1 mmol/L (ref 0.5–1.9)

## 2018-08-21 LAB — INFLUENZA PANEL BY PCR (TYPE A & B)
Influenza A By PCR: NEGATIVE
Influenza B By PCR: NEGATIVE

## 2018-08-21 MED ORDER — VANCOMYCIN HCL IN DEXTROSE 1-5 GM/200ML-% IV SOLN: 1000.0000 mg | Freq: Once | INTRAVENOUS | Status: DC

## 2018-08-21 MED ORDER — ACETAMINOPHEN 325 MG PO TABS
650.0000 mg | ORAL_TABLET | Freq: Four times a day (QID) | ORAL | Status: DC | PRN
  Administered 2018-08-21 – 2018-08-22 (×3): 650 mg via ORAL
  Filled 2018-08-21 (×2): qty 2

## 2018-08-21 MED ORDER — HYDROCODONE-ACETAMINOPHEN 5-325 MG PO TABS: 1.0000 | ORAL_TABLET | ORAL | Status: DC | PRN

## 2018-08-21 MED ORDER — SODIUM CHLORIDE 0.9 % IV SOLN
INTRAVENOUS | Status: DC
  Administered 2018-08-21 – 2018-08-23 (×4): via INTRAVENOUS

## 2018-08-21 MED ORDER — PANTOPRAZOLE SODIUM 40 MG PO TBEC
40.0000 mg | DELAYED_RELEASE_TABLET | ORAL | Status: DC
  Administered 2018-08-22: 40 mg via ORAL
  Filled 2018-08-21: qty 1

## 2018-08-21 MED ORDER — SODIUM CHLORIDE 0.9 % IV SOLN
1.0000 g | INTRAVENOUS | Status: DC
  Administered 2018-08-22: 1 g via INTRAVENOUS
  Filled 2018-08-21: qty 10
  Filled 2018-08-21: qty 1

## 2018-08-21 MED ORDER — CARVEDILOL 6.25 MG PO TABS
6.2500 mg | ORAL_TABLET | Freq: Two times a day (BID) | ORAL | Status: DC
  Administered 2018-08-21 – 2018-08-22 (×2): 6.25 mg via ORAL
  Filled 2018-08-21 (×2): qty 1

## 2018-08-21 MED ORDER — ASPIRIN EC 81 MG PO TBEC
81.0000 mg | DELAYED_RELEASE_TABLET | ORAL | Status: DC
  Filled 2018-08-21: qty 1

## 2018-08-21 MED ORDER — TAMSULOSIN HCL 0.4 MG PO CAPS
0.4000 mg | ORAL_CAPSULE | Freq: Every day | ORAL | Status: DC
  Administered 2018-08-22 – 2018-08-23 (×2): 0.4 mg via ORAL
  Filled 2018-08-21 (×2): qty 1

## 2018-08-21 MED ORDER — ENOXAPARIN SODIUM 40 MG/0.4ML ~~LOC~~ SOLN
40.0000 mg | Freq: Every day | SUBCUTANEOUS | Status: DC
  Administered 2018-08-21: 40 mg via SUBCUTANEOUS
  Filled 2018-08-21: qty 0.4

## 2018-08-21 MED ORDER — SODIUM CHLORIDE 0.9 % IV SOLN: 2.0000 g | Freq: Once | INTRAVENOUS | Status: DC

## 2018-08-21 MED ORDER — SENNOSIDES-DOCUSATE SODIUM 8.6-50 MG PO TABS
2.0000 | ORAL_TABLET | Freq: Every day | ORAL | Status: DC
  Filled 2018-08-21: qty 2

## 2018-08-21 MED ORDER — AZITHROMYCIN 250 MG PO TABS
500.0000 mg | ORAL_TABLET | Freq: Every day | ORAL | Status: DC
  Administered 2018-08-21: 500 mg via ORAL
  Filled 2018-08-21: qty 2

## 2018-08-21 MED ORDER — HYDRALAZINE HCL 25 MG PO TABS
25.0000 mg | ORAL_TABLET | Freq: Three times a day (TID) | ORAL | Status: DC
  Administered 2018-08-21 – 2018-08-22 (×2): 25 mg via ORAL
  Filled 2018-08-21 (×2): qty 1

## 2018-08-21 MED ORDER — METRONIDAZOLE IN NACL 5-0.79 MG/ML-% IV SOLN
500.0000 mg | Freq: Three times a day (TID) | INTRAVENOUS | Status: DC
  Administered 2018-08-21: 500 mg via INTRAVENOUS
  Filled 2018-08-21: qty 100

## 2018-08-21 MED ORDER — POTASSIUM CHLORIDE CRYS ER 20 MEQ PO TBCR
20.0000 meq | EXTENDED_RELEASE_TABLET | Freq: Once | ORAL | Status: AC
  Administered 2018-08-21: 20 meq via ORAL
  Filled 2018-08-21: qty 1

## 2018-08-21 MED ORDER — QUETIAPINE FUMARATE 100 MG PO TABS
100.0000 mg | ORAL_TABLET | Freq: Every day | ORAL | Status: DC
  Administered 2018-08-21 – 2018-08-22 (×2): 100 mg via ORAL
  Filled 2018-08-21 (×2): qty 1

## 2018-08-21 MED ORDER — ACETAMINOPHEN 325 MG PO TABS
ORAL_TABLET | ORAL | Status: AC
  Administered 2018-08-22: 650 mg via ORAL
  Filled 2018-08-21: qty 2

## 2018-08-21 MED ORDER — SODIUM CHLORIDE 0.9% FLUSH
3.0000 mL | Freq: Once | INTRAVENOUS | Status: AC
  Administered 2018-08-21: 3 mL via INTRAVENOUS

## 2018-08-21 MED ORDER — CITALOPRAM HYDROBROMIDE 10 MG PO TABS
20.0000 mg | ORAL_TABLET | Freq: Every morning | ORAL | Status: DC
  Administered 2018-08-22: 20 mg via ORAL
  Filled 2018-08-21: qty 2

## 2018-08-21 MED ORDER — CLONIDINE HCL 0.1 MG PO TABS
0.3000 mg | ORAL_TABLET | Freq: Two times a day (BID) | ORAL | Status: DC
  Administered 2018-08-21 – 2018-08-22 (×2): 0.3 mg via ORAL
  Filled 2018-08-21 (×2): qty 3

## 2018-08-21 MED ORDER — VANCOMYCIN HCL 10 G IV SOLR
1500.0000 mg | INTRAVENOUS | Status: AC
  Administered 2018-08-21: 1500 mg via INTRAVENOUS
  Filled 2018-08-21: qty 1500

## 2018-08-21 MED ORDER — TEMAZEPAM 15 MG PO CAPS
15.0000 mg | ORAL_CAPSULE | Freq: Every day | ORAL | Status: DC
  Administered 2018-08-21: 15 mg via ORAL
  Filled 2018-08-21: qty 1

## 2018-08-21 MED ORDER — ONDANSETRON HCL 4 MG/2ML IJ SOLN
INTRAMUSCULAR | Status: AC
  Administered 2018-08-22: 4 mg via INTRAVENOUS
  Filled 2018-08-21: qty 2

## 2018-08-21 MED ORDER — SODIUM CHLORIDE 0.9 % IV BOLUS (SEPSIS)
1000.0000 mL | Freq: Once | INTRAVENOUS | Status: AC
  Administered 2018-08-21: 1000 mL via INTRAVENOUS

## 2018-08-21 MED ORDER — ONDANSETRON HCL 4 MG/2ML IJ SOLN
4.0000 mg | Freq: Four times a day (QID) | INTRAMUSCULAR | Status: DC | PRN
  Administered 2018-08-21 – 2018-08-22 (×2): 4 mg via INTRAVENOUS
  Filled 2018-08-21: qty 2

## 2018-08-21 MED ORDER — SODIUM CHLORIDE 0.9 % IV SOLN
2.0000 g | INTRAVENOUS | Status: AC
  Administered 2018-08-21: 2 g via INTRAVENOUS
  Filled 2018-08-21: qty 2

## 2018-08-21 NOTE — ED Triage Notes (Signed)
Pt presents from home by [facility] for evaluation of possible sepsis due to current UTI that is being treated but not responding. EMS reports that family stated pt was more confused than normal and has hx of dementia.

## 2018-08-21 NOTE — ED Provider Notes (Signed)
[facility] DEPT Provider Note   CSN: [id number] Arrival date & time: 08/21/18  1851    History   Chief Complaint Chief Complaint  Patient presents with  . Recurrent UTI  . possible sepsis    HPI Darryl [name] is a 84 y.o. male.     The history is provided by the patient, the spouse and medical records. No language interpreter was used.  Cough  Cough characteristics:  Productive Sputum characteristics:  Unable to specify Severity:  Moderate Onset quality:  Gradual Duration:  2 days Timing:  Intermittent Progression:  Waxing and waning Chronicity:  New Relieved by:  Nothing Worsened by:  Nothing Associated symptoms: chills, fever and shortness of breath   Associated symptoms: no chest pain, no diaphoresis, no headaches, no rash, no rhinorrhea, no sinus congestion, no sore throat and no wheezing   Risk factors: recent infection     Past Medical History:  Diagnosis Date  . Arthritis   . BPH (benign prostatic hypertrophy) with urinary retention   . Chronic diastolic CHF (congestive heart failure) ([place])   . Colon polyp   . Coronary atherosclerosis of native coronary artery    a. s/p CABG 11/2003. b. s/p DES to radial-OM and SVG-RCA 11/05. c. s/p BMS to LCx 11/2008.  . Dilated aortic root ([place]) 05/29/2014   73mm by echo 04/2017  . Diverticulosis   . Dyslipidemia    statin intolerant  . Elevated fasting glucose   . Gastritis    a. by EGD 04/2004.  . H/O urinary retention    post surgical procedures; has foley cath  . Headache(784.0)   . Hiatal hernia   . HOH (hard of hearing)    slight hoh, rt>l  . HTN (hypertension)   . Hypertensive heart disease   . Insomnia   . Intracranial hemorrhage ([place])    a. small pontine [place] 2003.  [name] LBBB (left bundle branch block)   . Lumbar spondylolysis    multilevel DJD with mild spinal stenosis L3-L5  . Migraines   . Parkinson's disease ([place])   . Peptic ulcer disease    a. prior hx of  bleeding ulcers, including 12/2003 in setting of aspirin use.  . Renal insufficiency    renal duplex scan in 2012 showed no renal artery stenosis  . Sleep disturbance   . Stroke [facility])    no clinical manifestations - seen on neuroimaging  . Thrombocytopenia ([place])   . Tremor   . UTI (urinary tract infection)   . Wears glasses     Patient Active Problem List   Diagnosis Date Noted  . Ataxia 04/07/2018  . Acute metabolic encephalopathy 47/02/6282  . Unstable gait 04/06/2018  . Chest pain 04/06/2018  . CKD (chronic kidney disease) stage 3, GFR 30-59 ml/min (HCC) 04/06/2018  . Aspiration pneumonia ([place]) 11/15/2017  . B12 deficiency 07/13/2016  . Orthostatic hypotension 03/26/2016  . Chronic diastolic heart failure ([place]) 12/02/2015  . CAD (coronary artery disease), native coronary artery 11/28/2014  . BPH (benign prostatic hypertrophy) with urinary retention 11/19/2014  . Diastolic dysfunction 66/29/4765  . UTI (urinary tract infection) 10/07/2014  . Abnormal EKG 10/04/2014  . Urinary retention 10/04/2014  . Benign prostatic hyperplasia 10/04/2014  . UTI (lower urinary tract infection) 10/03/2014  . Sepsis ([place]) 10/03/2014  . Parkinsonism ([place]) 06/12/2014  . Dilated aortic root ([place]) 05/29/2014  . CKD (chronic kidney disease), stage III ([place]) 04/25/2014  . Gastritis   . Peptic ulcer disease   .  Stroke ([place])   . Hx of CABG 2005, PCI 2010   . Parkinson's disease ([place])   . Small vessel disease, cerebrovascular 10/30/2013  . Intracerebral hemorrhage ([place]) 10/30/2013  . Tremor 08/14/2013  . Benign essential HTN 04/13/2013  . Dyslipidemia 04/13/2013  . LBBB (left bundle branch block) 04/13/2013  . Preoperative clearance 04/13/2013  . DJD (degenerative joint disease) of lumbar spine 04/13/2013    Past Surgical History:  Procedure Laterality Date  . APPENDECTOMY  62   1963  . CATARACT EXTRACTION, BILATERAL    . CHOLECYSTECTOMY  1998  . COLON SURGERY  03/14/2008  . COLONOSCOPY   10/2001  . CORONARY ANGIOPLASTY  6/10,10/05   Dr. [name] cardiologist: last office visit 11/07/14  . CORONARY ARTERY BYPASS GRAFT  2005  . CYSTOSCOPY WITH LITHOLAPAXY N/A 11/19/2014   Procedure: CYSTOSCOPY WITH LITHOLAPAXY;  Surgeon: [name], MD;  Location: [facility];  Service: Urology;  Laterality: N/A;  . ESOPHAGOGASTRODUODENOSCOPY     10/2001, 12/2003, 04/2004  . LUMBAR LAMINECTOMY/DECOMPRESSION MICRODISCECTOMY N/A 05/03/2013   Procedure: LUMBAR LAMINECTOMY/DCOMPRESSION MICRODISCECTOMY RIGHT LUMBAR FOUR FIVE;  Surgeon: [name], MD;  Location: [facility];  Service: Neurosurgery;  Laterality: N/A;  . SHOULDER ARTHROSCOPY Left 9/10  . stones     hx  . TRANSURETHRAL RESECTION OF PROSTATE N/A 11/19/2014   Procedure: TRANSURETHRAL RESECTION OF THE PROSTATE WITH GYRUS INSTRUMENTS;  Surgeon: [name], MD;  Location: [facility];  Service: Urology;  Laterality: N/A;  . WISDOM TOOTH EXTRACTION          Home Medications    Prior to Admission medications   Medication Sig Start Date End Date Taking? Authorizing Provider  acetaminophen (TYLENOL) 500 MG tablet Take 1,000 mg by mouth every 8 (eight) hours as needed for mild pain or moderate pain.    [provider]  aspirin EC 81 MG tablet Take 81 mg by mouth. Patient takes 3 times a week    [provider]  Carbidopa-Levodopa ER (SINEMET CR) 25-100 MG tablet controlled release Take 1 tablet by mouth 3 (three) times daily. 12/17/17   Tat, Eustace Quail, DO  carvedilol (COREG) 6.25 MG tablet Take 1 tablet (6.25 mg total) by mouth 2 (two) times daily. 03/23/18   Sueanne Margarita, MD  citalopram (CELEXA) 20 MG tablet Take 20 mg by mouth at bedtime.     [provider]  cloNIDine (CATAPRES) 0.2 MG tablet Take 1.5 tablets (0.3 mg total) by mouth 2 (two) times daily. 03/23/18 03/23/19  Sueanne Margarita, MD  furosemide (LASIX) 20 MG tablet Take 1 tablet (20 mg total) by mouth daily.  03/23/18 03/18/19  Sueanne Margarita, MD  hydrALAZINE (APRESOLINE) 25 MG tablet Take 1 tablet (25 mg total) by mouth 3 (three) times daily. 08/31/17 08/26/18  Sueanne Margarita, MD  HYDROcodone-acetaminophen (NORCO/VICODIN) 5-325 MG tablet Take 1-2 tablets by mouth every 4 (four) hours as needed for moderate pain. 04/08/18   Charlynne Cousins, MD  irbesartan (AVAPRO) 300 MG tablet Take 300 mg by mouth at bedtime.     [provider]  KLOR-CON 10 10 MEQ tablet Take 1 tablet (10 mEq total) by mouth daily. 03/23/18 03/18/19  Sueanne Margarita, MD  LORazepam (ATIVAN) 1 MG tablet Take 1 tablet (1 mg total) by mouth 2 (two) times daily as needed for anxiety. 03/20/18   Drenda Freeze, MD  Maltodextrin-Xanthan Gum ([place]) POWD As needed with meals. Patient not taking:  Reported on 04/06/2018 11/17/17   Hosie Poisson, MD  nitroGLYCERIN (NITROSTAT) 0.4 MG SL tablet Place 1 tablet (0.4 mg total) under the tongue every 5 (five) minutes as needed for chest pain. 08/31/17   Sueanne Margarita, MD  pantoprazole (PROTONIX) 40 MG tablet Take 40 mg by mouth 3 (three) times a week.     [provider]  tamsulosin (FLOMAX) 0.4 MG CAPS capsule Take 0.4 mg by mouth daily. 02/01/18   [provider]  traZODone (DESYREL) 50 MG tablet TAKE 1 TABLET BY MOUTH EVERYDAY AT BEDTIME Patient taking differently: Take 25 mg by mouth at bedtime.  02/14/18   TatEustace Quail, DO    Family History Family History  Problem Relation Age of Onset  . CAD Father 26       ASCAD  . CAD Sister   . Diabetes Child     Social History Social History   Tobacco Use  . Smoking status: Former Smoker    Packs/day: 2.00    Years: 5.00    Pack years: 10.00    Types: Cigarettes    Last attempt to quit: 04/28/1959    Years since quitting: 59.3  . Smokeless tobacco: Never Used  . Tobacco comment: 55+ yrs ago  Substance Use Topics  . Alcohol use: No  . Drug use: No     Allergies   Ambien [zolpidem  tartrate]; Aspirin; Celebrex [celecoxib]; Nsaids; Toprol xl [metoprolol tartrate]; Altace [ramipril]; Amlodipine; Levothyroxine; Biaxin [clarithromycin]; and Tetracyclines & related   Review of Systems Review of Systems  Constitutional: Positive for chills, fatigue and fever. Negative for diaphoresis.  HENT: Positive for congestion. Negative for rhinorrhea and sore throat.   Eyes: Negative for visual disturbance.  Respiratory: Positive for cough, chest tightness and shortness of breath. Negative for wheezing and stridor.   Cardiovascular: Negative for chest pain, palpitations and leg swelling.  Gastrointestinal: Negative for abdominal pain, constipation, diarrhea, nausea and vomiting.  Genitourinary: Positive for difficulty urinating (foley in place). Negative for flank pain and frequency.  Musculoskeletal: Negative for back pain, neck pain and neck stiffness.  Skin: Negative for rash and wound.  Neurological: Negative for light-headedness and headaches.  Psychiatric/Behavioral: Positive for confusion. Negative for agitation.  All other systems reviewed and are negative.    Physical Exam Updated Vital Signs BP (!) 151/98 (BP Location: Right Arm)   Pulse (!) 115   Temp 100 F (37.8 C) (Oral)   Resp 20   SpO2 94%   Physical Exam Vitals signs and nursing note reviewed.  Constitutional:      General: He is not in acute distress.    Appearance: He is well-developed. He is not ill-appearing, toxic-appearing or diaphoretic.  HENT:     Head: Normocephalic and atraumatic.     Nose: Congestion present. No rhinorrhea.     Mouth/Throat:     Pharynx: No oropharyngeal exudate or posterior oropharyngeal erythema.  Eyes:     Conjunctiva/sclera: Conjunctivae normal.     Pupils: Pupils are equal, round, and reactive to light.  Neck:     Musculoskeletal: Neck supple. No muscular tenderness.  Cardiovascular:     Rate and Rhythm: Regular rhythm. Tachycardia present.     Heart sounds: No  murmur.  Pulmonary:     Effort: Pulmonary effort is normal. No respiratory distress.     Breath sounds: Rhonchi present. No wheezing or rales.  Chest:     Chest wall: No tenderness.  Abdominal:     Palpations: Abdomen  is soft.     Tenderness: There is no abdominal tenderness. There is no right CVA tenderness, left CVA tenderness or guarding.  Skin:    General: Skin is warm and dry.     Capillary Refill: Capillary refill takes less than 2 seconds.  Neurological:     General: No focal deficit present.     Mental Status: He is alert.     Sensory: No sensory deficit.     Motor: No weakness.  Psychiatric:        Mood and Affect: Mood normal.      ED Treatments / Results  Labs (all labs ordered are listed, but only abnormal results are displayed) Labs Reviewed  COMPREHENSIVE METABOLIC PANEL - Abnormal; Notable for the following components:      Result Value   Potassium 3.3 (*)    Chloride 112 (*)    Glucose, Bld 156 (*)    BUN 28 (*)    Creatinine, Ser 1.26 (*)    Calcium 8.7 (*)    Albumin 3.4 (*)    GFR calc non Af Amer 53 (*)    All other components within normal limits  CBC WITH DIFFERENTIAL/PLATELET - Abnormal; Notable for the following components:   RBC 3.40 (*)    Hemoglobin 11.0 (*)    HCT 31.4 (*)    Platelets 127 (*)    Lymphs Abs 0.5 (*)    Abs Immature Granulocytes 0.21 (*)    All other components within normal limits  URINALYSIS, ROUTINE W REFLEX MICROSCOPIC - Abnormal; Notable for the following components:   APPearance HAZY (*)    Hgb urine dipstick MODERATE (*)    Protein, ur 30 (*)    Leukocytes,Ua LARGE (*)    WBC, UA >50 (*)    Bacteria, UA RARE (*)    Non Squamous Epithelial 0-5 (*)    All other components within normal limits  CULTURE, BLOOD (ROUTINE X 2)  CULTURE, BLOOD (ROUTINE X 2)  URINE CULTURE  EXPECTORATED SPUTUM ASSESSMENT W REFEX TO RESP CULTURE  GRAM STAIN  LACTIC ACID, PLASMA  PROTIME-INR  LIPASE, BLOOD  INFLUENZA PANEL BY PCR  (TYPE A & B)  HIV ANTIBODY (ROUTINE TESTING W REFLEX)  STREP PNEUMONIAE URINARY ANTIGEN  CBC  BASIC METABOLIC PANEL    EKG EKG Interpretation  Date/Time:  Sunday August 21 2018 19:08:33 EST Ventricular Rate:  110 PR Interval:    QRS Duration: 143 QT Interval:  361 QTC Calculation: 489 R Axis:   1 Text Interpretation:  Sinus tachycardia with irregular rate Left bundle branch block When compared to prior, faster rate and some worsened ST depression in V5 and V6.  More irregulatiry.  No STEMI Confirmed by Antony Blackbird [phone]) on 08/21/2018 7:14:10 PM   Radiology Dg Chest 2 View  Result Date: 08/21/2018 CLINICAL DATA:  Sepsis EXAM: CHEST - 2 VIEW COMPARISON:  04/06/2018 FINDINGS: Prior CABG. Heart is normal size. Concern for patchy opacity in the right upper lobe concerning for pneumonia. Left lung clear. No effusions or acute bony abnormality. IMPRESSION: Concern for right upper lobe infiltrate/pneumonia. Electronically Signed   By: Rolm Baptise M.D.   On: 08/21/2018 19:49    Procedures Procedures (including critical care time)  CRITICAL CARE Performed by: Gwenyth Allegra Elodie Panameno Total critical care time: 45 minutes Critical care time was exclusive of separately billable procedures and treating other patients. Critical care was necessary to treat or prevent imminent or life-threatening deterioration. Critical care was time spent personally by  me on the following activities: development of treatment plan with patient and/or surrogate as well as nursing, discussions with consultants, evaluation of patient's response to treatment, examination of patient, obtaining history from patient or surrogate, ordering and performing treatments and interventions, ordering and review of laboratory studies, ordering and review of radiographic studies, pulse oximetry and re-evaluation of patient's condition.   Medications Ordered in ED Medications  hydrALAZINE (APRESOLINE) tablet 25 mg (25 mg Oral  Given 08/21/18 2228)  carvedilol (COREG) tablet 6.25 mg (6.25 mg Oral Given 08/21/18 2228)  aspirin EC tablet 81 mg (has no administration in time range)  citalopram (CELEXA) tablet 20 mg (has no administration in time range)  QUEtiapine (SEROQUEL) tablet 100 mg (100 mg Oral Given 08/21/18 2228)  senna-docusate (Senokot-S) tablet 2 tablet (has no administration in time range)  tamsulosin (FLOMAX) capsule 0.4 mg (has no administration in time range)  temazepam (RESTORIL) capsule 15 mg (15 mg Oral Given 08/21/18 2228)  pantoprazole (PROTONIX) EC tablet 40 mg (has no administration in time range)  HYDROcodone-acetaminophen (NORCO/VICODIN) 5-325 MG per tablet 1-2 tablet (has no administration in time range)  cloNIDine (CATAPRES) tablet 0.3 mg (0.3 mg Oral Given 08/21/18 2228)  enoxaparin (LOVENOX) injection 40 mg (40 mg Subcutaneous Given 08/21/18 2228)  acetaminophen (TYLENOL) tablet 650 mg (650 mg Oral Given 08/21/18 2140)  0.9 %  sodium chloride infusion ( Intravenous New Bag/Given 08/21/18 2248)  cefTRIAXone (ROCEPHIN) 1 g in sodium chloride 0.9 % 100 mL IVPB (has no administration in time range)  azithromycin (ZITHROMAX) tablet 500 mg (500 mg Oral Given 08/21/18 2228)  ondansetron (ZOFRAN) injection 4 mg (4 mg Intravenous Given 08/21/18 2138)  sodium chloride flush (NS) 0.9 % injection 3 mL (3 mLs Intravenous Given 08/21/18 1926)  ceFEPIme (MAXIPIME) 2 g in sodium chloride 0.9 % 100 mL IVPB (0 g Intravenous Stopped 08/21/18 2037)  vancomycin (VANCOCIN) 1,500 mg in sodium chloride 0.9 % 500 mL IVPB (1,500 mg Intravenous Transfusing/Transfer 08/21/18 2151)  sodium chloride 0.9 % bolus 1,000 mL (1,000 mLs Intravenous Transfusing/Transfer 08/21/18 2151)  potassium chloride SA (K-DUR,KLOR-CON) CR tablet 20 mEq (20 mEq Oral Given 08/21/18 2141)     Initial Impression / Assessment and Plan / ED Course  I have reviewed the triage vital signs and the nursing notes.  Pertinent labs & imaging results that were  available during my care of the patient were reviewed by me and considered in my medical decision making (see chart for details).        Darryl [name] is a 84 y.o. male with a past medical history sniffing for hypertension, dyslipidemia, Parkinson's, stroke, CAD status post PCI and CABG, CKD, parkinsonism, BPH, and urinary retention with Foley catheter in place, recent UTI, and prior pneumonia who presents with fevers, chills, productive cough, malaise, and altered mental status.  Patient is brought in by family reports that over the last 2 days patient has been more sleepy and acting more confused than his baseline.  They are concerned that patient has an infection.  Patient's temperature was 103 before coming into the emergency department.  He is also had nausea, vomiting, and has had productive cough.  They are concerned he may have pneumonia.  They also are concerned he has urinary tract infection given his Foley catheter that is currently in place for retention.  He was seen several weeks ago and started on antibiotics for likely urinary tract infection.  He is seen urology multiple times and had attempted Foley catheter discontinuation but  he continued to have retention requiring it.  He has not complained of chest pain or abdominal pain today but is feeling fatigued and general malaise.  He had concerning vital signs on arrival.  Vital signs on arrival were concerning for sepsis with fever 101.9, tachycardia of 118, tachypnea of 22.  With concern for a source of pneumonia or UTI, patient made a code sepsis.  On exam, patient able to move his neck.  Patient has no focal neurologic deficits on initial exam.  Patient is sleepy but arousable to voice.  No complaint of neck tenderness or neck pain.  Lungs had some coarseness bilaterally.  Chest was nontender.  Slight murmur appreciated.  Abdomen nontender.  Legs have no significant edema or tenderness.  No rash seen on legs.  Clinically I am  concerned about UTI or pneumonia.  Patient will have chest x-ray and screening labs.  He will be given broad-spectrum antibiotics and fluids for sepsis.  Patient will be admitted for likely sepsis with some worsened mental status.  8:26 PM X-ray confirms pneumonia.  Patient will be admitted for further management.   Final Clinical Impressions(s) / ED Diagnoses   Final diagnoses:  Sepsis due to pneumonia [facility])    ED Discharge Orders    None       Clinical Impression: 1. Sepsis due to pneumonia [facility])     Disposition: Admit  This note was prepared with assistance of Dragon voice recognition software. Occasional wrong-word or sound-a-like substitutions may have occurred due to the inherent limitations of voice recognition software.      Taygan Connell, Gwenyth Allegra, MD 08/22/18 [phone]

## 2018-08-21 NOTE — ED Notes (Signed)
ED TO INPATIENT HANDOFF REPORT  Name/Age/Gender Darryl [name] 84 y.o. male  Code Status    Code Status Orders  (From admission, onward)         Start     Ordered   08/21/18 2106  Do not attempt resuscitation (DNR)  Continuous    Question Answer Comment  In the event of cardiac or respiratory ARREST Do not call a "code blue"   In the event of cardiac or respiratory ARREST Do not perform Intubation, CPR, defibrillation or ACLS   In the event of cardiac or respiratory ARREST Use medication by any route, position, wound care, and other measures to relive pain and suffering. May use oxygen, suction and manual treatment of airway obstruction as needed for comfort.      08/21/18 2109        Code Status History    Date Active Date Inactive Code Status Order ID Comments User Context   04/07/2018 0808 04/08/2018 1645 DNR [id number]  Charlynne Cousins, MD Inpatient   04/07/2018 0202 04/07/2018 0808 DNR [id number]  Toy Baker, MD ED   11/15/2017 0232 11/17/2017 1359 Full Code [id number]  Ivor Costa, MD ED   11/19/2014 1306 11/20/2014 1630 Full Code [id number]  [name], MD Inpatient   10/07/2014 1558 10/13/2014 1641 Full Code [id number]  Caren Griffins, MD ED   10/03/2014 2024 10/05/2014 1917 Full Code [id number]  Theressa Millard, MD Inpatient   05/03/2013 1212 05/04/2013 1317 Full Code [id number]  [name], MD Inpatient    Advance Directive Documentation     Most Recent Value  Type of Advance Directive  Out of facility DNR (pink MOST or yellow form)  Pre-existing out of facility DNR order (yellow form or pink MOST form)  Yellow form placed in chart (order not valid for inpatient use)  "MOST" Form in Place?  -      Home/SNF/Other Home  Chief Complaint UTI/Sepsis  Level of Care/Admitting Diagnosis ED Disposition    ED Disposition Condition [place]: [place] [100102]  Level of Care: Stepdown [14]  Admit to SDU based on  following criteria: Hemodynamic compromise or significant risk of instability:  Patient requiring short term acute titration and management of vasoactive drips, and invasive monitoring (i.e., CVP and Arterial line).  Diagnosis: Community acquired pneumonia of right upper lobe of lung [facility]) [[id number]]  Admitting Physician: Etta Quill [phone]  Attending Physician: Etta Quill [phone]  Estimated length of stay: past midnight tomorrow  Certification:: I certify this patient will need inpatient services for at least 2 midnights  PT Class (Do Not Modify): Inpatient [101]  PT Acc Code (Do Not Modify): Private [1]       Medical History Past Medical History:  Diagnosis Date  . Arthritis   . BPH (benign prostatic hypertrophy) with urinary retention   . Chronic diastolic CHF (congestive heart failure) ([place])   . Colon polyp   . Coronary atherosclerosis of native coronary artery    a. s/p CABG 11/2003. b. s/p DES to radial-OM and SVG-RCA 11/05. c. s/p BMS to LCx 11/2008.  . Dilated aortic root ([place]) 05/29/2014   80mm by echo 04/2017  . Diverticulosis   . Dyslipidemia    statin intolerant  . Elevated fasting glucose   . Gastritis    a. by EGD 04/2004.  . H/O urinary retention    post surgical procedures; has foley cath  . Headache(784.0)   .  Hiatal hernia   . HOH (hard of hearing)    slight hoh, rt>l  . HTN (hypertension)   . Hypertensive heart disease   . Insomnia   . Intracranial hemorrhage ([place])    a. small pontine [place] 2003.  [name] LBBB (left bundle branch block)   . Lumbar spondylolysis    multilevel DJD with mild spinal stenosis L3-L5  . Migraines   . Parkinson's disease ([place])   . Peptic ulcer disease    a. prior hx of bleeding ulcers, including 12/2003 in setting of aspirin use.  . Renal insufficiency    renal duplex scan in 2012 showed no renal artery stenosis  . Sleep disturbance   . Stroke [facility])    no clinical manifestations - seen on neuroimaging  . Thrombocytopenia  ([place])   . Tremor   . UTI (urinary tract infection)   . Wears glasses     Allergies Allergies  Allergen Reactions  . Ambien [Zolpidem Tartrate] Other (See Comments)    hallucinations  . Aspirin Other (See Comments)    ulcers  . Celebrex [Celecoxib] Other (See Comments)    Due to ulcer.  . Nsaids Other (See Comments)    Due to ulcer.  . Toprol Xl [Metoprolol Tartrate] Swelling  . Altace [Ramipril]     UNKNOWN  . Amlodipine     Feet swell  . Levothyroxine Other (See Comments)    "made him feel bad"  . Biaxin [Clarithromycin] Nausea Only  . Tetracyclines & Related Nausea Only    IV Location/Drains/Wounds Patient Lines/Drains/Airways Status   Active Line/Drains/Airways    Name:   Placement date:   Placement time:   Site:   Days:   Peripheral IV 08/21/18 Left Wrist   08/21/18    1845    Wrist   less than 1   Peripheral IV 08/21/18 Right Antecubital   08/21/18    1920    Antecubital   less than 1   Urethral Catheter Brian DeGraphenreid, NT Latex;Straight-tip 16 Fr.   08/09/18    0244    Latex;Straight-tip   12   Urethral Catheter Royal Beirne H. Rn Latex 16 Fr.   08/21/18    2050    Latex   less than 1   Incision 05/03/13 Back Other (Comment)   05/03/13    0937     1936   Incision (Closed) 11/19/14 Penis Other (Comment)   11/19/14    0818     1371          Labs/Imaging Results for orders placed or performed during the hospital encounter of 08/21/18 (from the past 48 hour(s))  Comprehensive metabolic panel     Status: Abnormal   Collection Time: 08/21/18  7:27 PM  Result Value Ref Range   Sodium 141 135 - 145 mmol/L   Potassium 3.3 (L) 3.5 - 5.1 mmol/L   Chloride 112 (H) 98 - 111 mmol/L   CO2 23 22 - 32 mmol/L   Glucose, Bld 156 (H) 70 - 99 mg/dL   BUN 28 (H) 8 - 23 mg/dL   Creatinine, Ser 1.26 (H) 0.61 - 1.24 mg/dL   Calcium 8.7 (L) 8.9 - 10.3 mg/dL   Total Protein 6.5 6.5 - 8.1 g/dL   Albumin 3.4 (L) 3.5 - 5.0 g/dL   AST 20 15 - 41 U/L   ALT 19 0 - 44 U/L   Alkaline  Phosphatase 89 38 - 126 U/L   Total Bilirubin 0.7 0.3 - 1.2 mg/dL  GFR calc non Af Amer 53 (L) >60 mL/min   GFR calc Af Amer >60 >60 mL/min   Anion gap 6 5 - 15    Comment: Performed at [facility], [place] [address], [place], [place] [address]  Lactic acid, plasma     Status: None   Collection Time: 08/21/18  7:27 PM  Result Value Ref Range   Lactic Acid, Venous 1.0 0.5 - 1.9 mmol/L    Comment: Performed at [facility], [place] [address], [place], [place] [address]  CBC with Differential     Status: Abnormal   Collection Time: 08/21/18  7:27 PM  Result Value Ref Range   WBC 8.9 4.0 - 10.5 K/uL   RBC 3.40 (L) 4.22 - 5.81 MIL/uL   Hemoglobin 11.0 (L) 13.0 - 17.0 g/dL   HCT 31.4 (L) 39.0 - 52.0 %   MCV 92.4 80.0 - 100.0 fL   MCH 32.4 26.0 - 34.0 pg   MCHC 35.0 30.0 - 36.0 g/dL   RDW 13.6 11.5 - 15.5 %   Platelets 127 (L) 150 - 400 K/uL   nRBC 0.0 0.0 - 0.2 %   Neutrophils Relative % 83 %   Neutro Abs 7.3 1.7 - 7.7 K/uL   Lymphocytes Relative 6 %   Lymphs Abs 0.5 (L) 0.7 - 4.0 K/uL   Monocytes Relative 9 %   Monocytes Absolute 0.8 0.1 - 1.0 K/uL   Eosinophils Relative 0 %   Eosinophils Absolute 0.0 0.0 - 0.5 K/uL   Basophils Relative 0 %   Basophils Absolute 0.0 0.0 - 0.1 K/uL   Immature Granulocytes 2 %   Abs Immature Granulocytes 0.21 (H) 0.00 - 0.07 K/uL    Comment: Performed at [facility], [place] [address] [place], [place] [address]  Protime-INR     Status: None   Collection Time: 08/21/18  7:27 PM  Result Value Ref Range   Prothrombin Time 13.3 11.4 - 15.2 seconds   INR 1.02     Comment: Performed at [facility], [place] [address] [place], [place] [address]  Lipase, blood     Status: None   Collection Time: 08/21/18  7:53 PM  Result Value Ref Range   Lipase 35 11 - 51 U/L    Comment: Performed at [facility], [place] [address] [place],  [address]   Dg Chest 2  View  Result Date: 08/21/2018 CLINICAL DATA:  Sepsis EXAM: CHEST - 2 VIEW COMPARISON:  04/06/2018 FINDINGS: Prior CABG. Heart is normal size. Concern for patchy opacity in the right upper lobe concerning for pneumonia. Left lung clear. No effusions or acute bony abnormality. IMPRESSION: Concern for right upper lobe infiltrate/pneumonia. Electronically Signed   By: Rolm Baptise M.D.   On: 08/21/2018 19:49   EKG Interpretation  Date/Time:  Sunday August 21 2018 19:08:33 EST Ventricular Rate:  110 PR Interval:    QRS Duration: 143 QT Interval:  361 QTC Calculation: 489 R Axis:   1 Text Interpretation:  Sinus tachycardia with irregular rate Left bundle branch block When compared to prior, faster rate and some worsened ST depression in V5 and V6.  More irregulatiry.  No STEMI Confirmed by Tegeler, Chris ([id number]) on 08/21/2018 7:14:10 PM   Pending Labs Unresulted Labs (From admission, onward)    Start     Ordered   08/22/18 0500  CBC  Tomorrow morning,   R     02 /23/20 2109   08/22/18  8341  Basic metabolic panel  Tomorrow morning,   R     08/21/18 2109   08/21/18 2105  Culture, sputum-assessment  Once,   R     08/21/18 2109   08/21/18 2105  Gram stain  Once,   R     08/21/18 2109   08/21/18 2105  HIV antibody (Routine Screening)  Once,   R     08/21/18 2109   08/21/18 2105  Strep pneumoniae urinary antigen  Once,   R     08/21/18 2109   08/21/18 2042  Influenza panel by PCR (type A & B)  (Influenza PCR Panel)  Once,   R     08/21/18 2041   08/21/18 1953  Urine culture  ONCE - STAT,   STAT     08/21/18 1953   08/21/18 1924  Lactic acid, plasma  Now then every 2 hours,   STAT     08/21/18 1924   08/21/18 1924  Culture, blood (Routine x 2)  BLOOD CULTURE X 2,   STAT     08/21/18 1924   08/21/18 1924  Urinalysis, Routine w reflex microscopic  ONCE - STAT,   STAT     08/21/18 1924          Vitals/Pain Today's Vitals   08/21/18 2030 08/21/18 2100 08/21/18 2130 08/21/18 2130  BP:  (!) 143/79 (!) 134/91 (!) 144/99   Pulse: 83 (!) 118 (!) 124   Resp: 14 (!) 27 (!) 30   Temp:    (!) 103 F (39.4 C)  TempSrc:    Oral  SpO2: 95% 94% 94%   Weight:      Height:      PainSc:        Isolation Precautions Droplet precaution  Medications Medications  vancomycin (VANCOCIN) 1,500 mg in sodium chloride 0.9 % 500 mL IVPB ( Intravenous Rate/Dose Verify 08/21/18 2128)  hydrALAZINE (APRESOLINE) tablet 25 mg (has no administration in time range)  carvedilol (COREG) tablet 6.25 mg (has no administration in time range)  aspirin EC tablet 81 mg (has no administration in time range)  citalopram (CELEXA) tablet 20 mg (has no administration in time range)  QUEtiapine (SEROQUEL) tablet 100 mg (has no administration in time range)  senna-docusate (Senokot-S) tablet 2 tablet (has no administration in time range)  tamsulosin (FLOMAX) capsule 0.4 mg (has no administration in time range)  temazepam (RESTORIL) capsule 15 mg (has no administration in time range)  pantoprazole (PROTONIX) EC tablet 40 mg (has no administration in time range)  HYDROcodone-acetaminophen (NORCO/VICODIN) 5-325 MG per tablet 1-2 tablet (has no administration in time range)  cloNIDine (CATAPRES) tablet 0.3 mg (has no administration in time range)  enoxaparin (LOVENOX) injection 40 mg (has no administration in time range)  acetaminophen (TYLENOL) tablet 650 mg (650 mg Oral Given 08/21/18 2140)  0.9 %  sodium chloride infusion (has no administration in time range)  cefTRIAXone (ROCEPHIN) 1 g in sodium chloride 0.9 % 100 mL IVPB (has no administration in time range)  azithromycin (ZITHROMAX) tablet 500 mg (has no administration in time range)  ondansetron (ZOFRAN) injection 4 mg (4 mg Intravenous Given 08/21/18 2138)  acetaminophen (TYLENOL) 325 MG tablet (has no administration in time range)  ondansetron (ZOFRAN) 4 MG/2ML injection (has no administration in time range)  sodium chloride flush (NS) 0.9 % injection 3 mL  (3 mLs Intravenous Given 08/21/18 1926)  ceFEPIme (MAXIPIME) 2 g in sodium chloride 0.9 % 100 mL IVPB (0 g  Intravenous Stopped 08/21/18 2037)  sodium chloride 0.9 % bolus 1,000 mL (1,000 mLs Intravenous New Bag/Given 08/21/18 2004)  potassium chloride SA (K-DUR,KLOR-CON) CR tablet 20 mEq (20 mEq Oral Given 08/21/18 2141)    Mobility walks with person assist

## 2018-08-21 NOTE — H&P (Signed)
History and Physical    MAYS PAINO OZH:[id number] DOB: 05-09-1935 DOA: 08/21/2018  PCP: [name], MD  Patient coming from: Home  I have personally briefly reviewed patient's old medical records in [place]  Chief Complaint: AMS  HPI: Darryl [name] is a 84 y.o. male with medical history significant of HTN, Dementia, LBBB, CAD s/p CABG, recurrent UTIs, urinary retention foley dependent now.  Patient presents to the ED with increasing confusion, fever up to 103 at home, productive cough, chills.  Symptoms progressively worsening over the past 2 days.  Associated N/V.  Seen several weeks ago and put on ABx for UTI.  Associated headache, but no meningismus.  ED Course: T 101.9, HR 120, CXR reveals RUL PNA.  UA pending still.  Review of Systems: As per HPI otherwise 10 point review of systems negative.   Past Medical History:  Diagnosis Date  . Arthritis   . BPH (benign prostatic hypertrophy) with urinary retention   . Chronic diastolic CHF (congestive heart failure) ([place])   . Colon polyp   . Coronary atherosclerosis of native coronary artery    a. s/p CABG 11/2003. b. s/p DES to radial-OM and SVG-RCA 11/05. c. s/p BMS to LCx 11/2008.  . Dilated aortic root ([place]) 05/29/2014   37mm by echo 04/2017  . Diverticulosis   . Dyslipidemia    statin intolerant  . Elevated fasting glucose   . Gastritis    a. by EGD 04/2004.  . H/O urinary retention    post surgical procedures; has foley cath  . Headache(784.0)   . Hiatal hernia   . HOH (hard of hearing)    slight hoh, rt>l  . HTN (hypertension)   . Hypertensive heart disease   . Insomnia   . Intracranial hemorrhage ([place])    a. small pontine [place] 2003.  [name] LBBB (left bundle branch block)   . Lumbar spondylolysis    multilevel DJD with mild spinal stenosis L3-L5  . Migraines   . Parkinson's disease ([place])   . Peptic ulcer disease    a. prior hx of bleeding ulcers, including 12/2003 in setting of aspirin use.  .  Renal insufficiency    renal duplex scan in 2012 showed no renal artery stenosis  . Sleep disturbance   . Stroke [facility])    no clinical manifestations - seen on neuroimaging  . Thrombocytopenia ([place])   . Tremor   . UTI (urinary tract infection)   . Wears glasses     Past Surgical History:  Procedure Laterality Date  . APPENDECTOMY  62   1963  . CATARACT EXTRACTION, BILATERAL    . CHOLECYSTECTOMY  1998  . COLON SURGERY  03/14/2008  . COLONOSCOPY  10/2001  . CORONARY ANGIOPLASTY  6/10,10/05   Dr. [name] cardiologist: last office visit 11/07/14  . CORONARY ARTERY BYPASS GRAFT  2005  . CYSTOSCOPY WITH LITHOLAPAXY N/A 11/19/2014   Procedure: CYSTOSCOPY WITH LITHOLAPAXY;  Surgeon: [name], MD;  Location: [facility];  Service: Urology;  Laterality: N/A;  . ESOPHAGOGASTRODUODENOSCOPY     10/2001, 12/2003, 04/2004  . LUMBAR LAMINECTOMY/DECOMPRESSION MICRODISCECTOMY N/A 05/03/2013   Procedure: LUMBAR LAMINECTOMY/DCOMPRESSION MICRODISCECTOMY RIGHT LUMBAR FOUR FIVE;  Surgeon: [name], MD;  Location: [place] [facility];  Service: Neurosurgery;  Laterality: N/A;  . SHOULDER ARTHROSCOPY Left 9/10  . stones     hx  . TRANSURETHRAL RESECTION OF PROSTATE N/A 11/19/2014   Procedure: TRANSURETHRAL RESECTION OF THE PROSTATE WITH GYRUS INSTRUMENTS;  Surgeon: [name], MD;  Location: [facility];  Service: Urology;  Laterality: N/A;  . WISDOM TOOTH EXTRACTION       reports that he quit smoking about 59 years ago. His smoking use included cigarettes. He has a 10.00 pack-year smoking history. He has never used smokeless tobacco. He reports that he does not drink alcohol or use drugs.  Allergies  Allergen Reactions  . Ambien [Zolpidem Tartrate] Other (See Comments)    hallucinations  . Aspirin Other (See Comments)    ulcers  . Celebrex [Celecoxib] Other (See Comments)    Due to ulcer.  . Nsaids Other (See Comments)    Due to ulcer.  . Toprol Xl [Metoprolol  Tartrate] Swelling  . Altace [Ramipril]     UNKNOWN  . Amlodipine     Feet swell  . Levothyroxine Other (See Comments)    "made him feel bad"  . Biaxin [Clarithromycin] Nausea Only  . Tetracyclines & Related Nausea Only    Family History  Problem Relation Age of Onset  . CAD Father 71       ASCAD  . CAD Sister   . Diabetes Child      Prior to Admission medications   Medication Sig Start Date End Date Taking? Authorizing Provider  acetaminophen (TYLENOL) 500 MG tablet Take 1,000 mg by mouth every 8 (eight) hours as needed for mild pain or moderate pain.   Yes [provider]  aspirin EC 81 MG tablet Take 81 mg by mouth. Patient takes 3 times a week   Yes [provider]  carvedilol (COREG) 6.25 MG tablet Take 1 tablet (6.25 mg total) by mouth 2 (two) times daily. 03/23/18  Yes Turner, Eber Hong, MD  citalopram (CELEXA) 20 MG tablet Take 20 mg by mouth every morning.    Yes [provider]  cloNIDine (CATAPRES) 0.2 MG tablet Take 1.5 tablets (0.3 mg total) by mouth 2 (two) times daily. 03/23/18 03/23/19 Yes Turner, Eber Hong, MD  furosemide (LASIX) 20 MG tablet Take 1 tablet (20 mg total) by mouth daily. 03/23/18 03/18/19 Yes Turner, Eber Hong, MD  hydrALAZINE (APRESOLINE) 25 MG tablet Take 1 tablet (25 mg total) by mouth 3 (three) times daily. 08/31/17 08/26/18 Yes Turner, Eber Hong, MD  HYDROcodone-acetaminophen (NORCO/VICODIN) 5-325 MG tablet Take 1-2 tablets by mouth every 4 (four) hours as needed for moderate pain. 04/08/18  Yes Charlynne Cousins, MD  irbesartan (AVAPRO) 300 MG tablet Take 300 mg by mouth at bedtime.    Yes [provider]  pantoprazole (PROTONIX) 40 MG tablet Take 40 mg by mouth 3 (three) times a week.    Yes [provider]  Probiotic Product (PROBIOTIC DAILY PO) Take 1 capsule by mouth daily.   Yes [provider]  QUEtiapine (SEROQUEL) 100 MG tablet Take 100 mg by mouth at bedtime. 08/10/18  Yes [provider]    SENNA PLUS 8.6-50 MG tablet Take 2 tablets by mouth daily. 08/10/18  Yes [provider]  tamsulosin (FLOMAX) 0.4 MG CAPS capsule Take 0.4 mg by mouth daily. 02/01/18  Yes [provider]  temazepam (RESTORIL) 15 MG capsule Take 15 mg by mouth every evening. 08/10/18  Yes [provider]  nitroGLYCERIN (NITROSTAT) 0.4 MG SL tablet Place 1 tablet (0.4 mg total) under the tongue every 5 (five) minutes as needed for chest pain. 08/31/17   Sueanne Margarita, MD    Physical Exam: Vitals:   08/21/18 1921 08/21/18 1930 08/21/18  2002 08/21/18 2030  BP: (!) 151/98 131/87 127/77 (!) 143/79  Pulse: (!) 118 (!) 109 97 83  Resp: 17 (!) 21 (!) 22 14  Temp: (!) 101.9 F (38.8 C)     TempSrc: Rectal     SpO2: 94% 95% 93% 95%  Weight:      Height:        Constitutional: NAD, calm, comfortable Eyes: PERRL, lids and conjunctivae normal ENMT: Mucous membranes are moist. Posterior pharynx clear of any exudate or lesions.Normal dentition.  Neck: normal, supple, no masses, no thyromegaly Respiratory: Coarse breath sounds, esp RUL Cardiovascular: Tachycardic Abdomen: no tenderness, no masses palpated. No hepatosplenomegaly. Bowel sounds positive.  Musculoskeletal: no clubbing / cyanosis. No joint deformity upper and lower extremities. Good ROM, no contractures. Normal muscle tone.  Skin: no rashes, lesions, ulcers. No induration Neurologic: CN 2-12 grossly intact. Sensation intact, DTR normal. Strength 5/5 in all 4.  Psychiatric: Mild confusion, slow to answer questions though does eventually answer appropriately.   Labs on Admission: I have personally reviewed following labs and imaging studies  CBC: Recent Labs  Lab 08/21/18 1927  WBC 8.9  NEUTROABS 7.3  HGB 11.0*  HCT 31.4*  MCV 92.4  PLT 268*   Basic Metabolic Panel: Recent Labs  Lab 08/21/18 1927  NA 141  K 3.3*  CL 112*  CO2 23  GLUCOSE 156*  BUN 28*  CREATININE 1.26*  CALCIUM 8.7*   GFR: Estimated  Creatinine Clearance: 47.5 mL/min (A) (by C-G formula based on SCr of 1.26 mg/dL (H)). Liver Function Tests: Recent Labs  Lab 08/21/18 1927  AST 20  ALT 19  ALKPHOS 89  BILITOT 0.7  PROT 6.5  ALBUMIN 3.4*   No results for input(s): LIPASE, AMYLASE in the last 168 hours. No results for input(s): AMMONIA in the last 168 hours. Coagulation Profile: Recent Labs  Lab 08/21/18 1927  INR 1.02   Cardiac Enzymes: No results for input(s): CKTOTAL, CKMB, CKMBINDEX, TROPONINI in the last 168 hours. BNP (last 3 results) No results for input(s): PROBNP in the last 8760 hours. HbA1C: No results for input(s): HGBA1C in the last 72 hours. CBG: No results for input(s): GLUCAP in the last 168 hours. Lipid Profile: No results for input(s): CHOL, HDL, LDLCALC, TRIG, CHOLHDL, LDLDIRECT in the last 72 hours. Thyroid Function Tests: No results for input(s): TSH, T4TOTAL, FREET4, T3FREE, THYROIDAB in the last 72 hours. Anemia Panel: No results for input(s): VITAMINB12, FOLATE, FERRITIN, TIBC, IRON, RETICCTPCT in the last 72 hours. Urine analysis:    Component Value Date/Time   COLORURINE YELLOW 08/08/2018 2331   APPEARANCEUR HAZY (A) 08/08/2018 2331   LABSPEC 1.015 08/08/2018 2331   PHURINE 6.0 08/08/2018 2331   GLUCOSEU NEGATIVE 08/08/2018 2331   HGBUR SMALL (A) 08/08/2018 2331   BILIRUBINUR NEGATIVE 08/08/2018 2331   KETONESUR NEGATIVE 08/08/2018 2331   PROTEINUR NEGATIVE 08/08/2018 2331   UROBILINOGEN 1.0 10/11/2014 1609   NITRITE NEGATIVE 08/08/2018 2331   LEUKOCYTESUR SMALL (A) 08/08/2018 2331    Radiological Exams on Admission: Dg Chest 2 View  Result Date: 08/21/2018 CLINICAL DATA:  Sepsis EXAM: CHEST - 2 VIEW COMPARISON:  04/06/2018 FINDINGS: Prior CABG. Heart is normal size. Concern for patchy opacity in the right upper lobe concerning for pneumonia. Left lung clear. No effusions or acute bony abnormality. IMPRESSION: Concern for right upper lobe infiltrate/pneumonia.  Electronically Signed   By: Rolm Baptise M.D.   On: 08/21/2018 19:49    EKG: Independently reviewed.  Assessment/Plan Principal Problem:  Community acquired pneumonia of right upper lobe of lung ([place]) Active Problems:   Benign essential HTN   Urinary retention   Chronic diastolic heart failure (HCC)   Acute metabolic encephalopathy   CKD (chronic kidney disease) stage 3, GFR 30-59 ml/min (HCC)   Dementia (HCC)    1. RUL CAP - 1. PNA pathway 2. Influenza PCR pending 3. Sputum and blood Cx 4. Got cefepime, flagyl, vanc in ED, will switch to rocephin / azithro 5. Zofran PRN N/V 6. Tylenol PRN fever 7. Tele monitor for tachycardia 8. IVF: NS at 100 cc/hr, got 1L bolus in ED, BPs running on the higher side though (865H systolic), Lactate only 1.0, so dont really have strong indication to do a full 42ml / kg bolus right now. 9. Repeat CBC in AM 2. Acute metabolic encephalopathy on top of chronic dementia - 1. Unclear dementia cause: at one point they thought it was parkinson's but ultimately decided against it and sinemet made him worse according to wife (has been off of sinemet for months now). 2. Cont home meds, 3. Looks like ativan makes his delirium worse according to pal care notes from last month 4. If needed, then add halidol 5. Will send to SDU overnight 3. Urinary retention - 1. Foley in place 2. Strict intake and output 3. UA pending 4. UCx pending 5. Could also have UTI as well as PNA, not yet determined. 4. HTN - looks to be fairly severe / difficult to control given the number of meds he is on 1. Continue Coreg 2. Continue Clonidine 3. Continue Hydralazine 4. Will hold Lasix and ARB for the moment 5. CKD stage 3 - 1. Creat at baseline 2. Strict intake and output with acute illness 3. Repeat BMP in AM 6. Chronic diastolic CHF - 1. Holding lasix 2. Gentle hydration 3. Watch for signs of fluid overload  DVT prophylaxis: Lovenox Code Status: DNR Family  Communication: Wife at bedside Disposition Plan: Home after admit Consults called: None Admission status: Admit to inpatient  Severity of Illness: The appropriate patient status for this patient is INPATIENT. Inpatient status is judged to be reasonable and necessary in order to provide the required intensity of service to ensure the patient's safety. The patient's presenting symptoms, physical exam findings, and initial radiographic and laboratory data in the context of their chronic comorbidities is felt to place them at high risk for further clinical deterioration. Furthermore, it is not anticipated that the patient will be medically stable for discharge from the hospital within 2 midnights of admission. The following factors support the patient status of inpatient.   " The patient's presenting symptoms include cough, fever, AMS. " The worrisome physical exam findings include AMS, fever, coarse breath sounds, T 101.9 here (103 at home). " The initial radiographic and laboratory data are worrisome because of RUL PNA. " The chronic co-morbidities include dementia, chronic foley catheter, CKD stage 3, HTN.   * I certify that at the point of admission it is my clinical judgment that the patient will require inpatient hospital care spanning beyond 2 midnights from the point of admission due to high intensity of service, high risk for further deterioration and high frequency of surveillance required.*    Kala Gassmann M. DO [facility]  How to contact the [facility] Attending or Consulting provider [place] or covering provider during after hours [place], for this patient?  1. Check the care team in [facility] and look for a) attending/consulting [facility] provider  listed and b) the [facility] team listed 2. Log into [email or web address]  Amion Physician Scheduling and messaging for groups and whole hospitals  On call and physician scheduling software for group practices, residents, hospitalists and other medical providers  for call, clinic, rotation and shift schedules. On[employer] is a hospital-wide system for scheduling doctors and paging doctors on call. EasyPlot is for scientific plotting and data analysis.  [email or web address]  and use [place]'s universal password to access. If you do not have the password, please contact the hospital operator.  3. Locate the [facility] provider you are looking for under [facility] and page to a number that you can be directly reached. 4. If you still have difficulty reaching the provider, please page the [facility] (Director on Call) for the Hospitalists listed on amion for assistance.  08/21/2018, 9:11 PM

## 2018-08-21 NOTE — ED Notes (Signed)
Dr.Tegler notified of pt being possible sepsis.

## 2018-08-21 NOTE — Progress Notes (Signed)
A consult was received from an ED physician for Cefepime, Vancomycin per pharmacy dosing.  The patient's profile has been reviewed for ht/wt/allergies/indication/available labs.   A one time order has been placed for Cefepime 2g IV, Vancomycin 1500 mg IV.  Further antibiotics/pharmacy consults should be ordered by admitting physician if indicated.                       Thank you,  Gretta Arab PharmD, BCPS Pager [phone] 08/21/2018 7:56 PM

## 2018-08-22 ENCOUNTER — Inpatient Hospital Stay (HOSPITAL_COMMUNITY): Payer: Medicare Other

## 2018-08-22 DIAGNOSIS — I10 Essential (primary) hypertension: Secondary | ICD-10-CM

## 2018-08-22 DIAGNOSIS — N183 Chronic kidney disease, stage 3 (moderate): Secondary | ICD-10-CM

## 2018-08-22 DIAGNOSIS — G9341 Metabolic encephalopathy: Secondary | ICD-10-CM

## 2018-08-22 DIAGNOSIS — F0391 Unspecified dementia with behavioral disturbance: Secondary | ICD-10-CM

## 2018-08-22 DIAGNOSIS — J181 Lobar pneumonia, unspecified organism: Secondary | ICD-10-CM

## 2018-08-22 DIAGNOSIS — K2970 Gastritis, unspecified, without bleeding: Secondary | ICD-10-CM

## 2018-08-22 DIAGNOSIS — I5032 Chronic diastolic (congestive) heart failure: Secondary | ICD-10-CM

## 2018-08-22 DIAGNOSIS — R339 Retention of urine, unspecified: Secondary | ICD-10-CM

## 2018-08-22 DIAGNOSIS — K279 Peptic ulcer, site unspecified, unspecified as acute or chronic, without hemorrhage or perforation: Secondary | ICD-10-CM

## 2018-08-22 LAB — BASIC METABOLIC PANEL
Anion gap: 3 — ABNORMAL LOW (ref 5–15)
BUN: 24 mg/dL — ABNORMAL HIGH (ref 8–23)
CHLORIDE: 114 mmol/L — AB (ref 98–111)
CO2: 25 mmol/L (ref 22–32)
CREATININE: 1.43 mg/dL — AB (ref 0.61–1.24)
Calcium: 8.2 mg/dL — ABNORMAL LOW (ref 8.9–10.3)
GFR calc Af Amer: 53 mL/min — ABNORMAL LOW (ref >60)
GFR calc non Af Amer: 46 mL/min — ABNORMAL LOW (ref >60)
Glucose, Bld: 164 mg/dL — ABNORMAL HIGH (ref 70–99)
POTASSIUM: 4.1 mmol/L (ref 3.5–5.1)
Sodium: 142 mmol/L (ref 135–145)

## 2018-08-22 LAB — CBC
HCT: 29.6 % — ABNORMAL LOW (ref 39.0–52.0)
Hemoglobin: 9.9 g/dL — ABNORMAL LOW (ref 13.0–17.0)
MCH: 31.8 pg (ref 26.0–34.0)
MCHC: 33.4 g/dL (ref 30.0–36.0)
MCV: 95.2 fL (ref 80.0–100.0)
Platelets: 113 10*3/uL — ABNORMAL LOW (ref 150–400)
RBC: 3.11 MIL/uL — AB (ref 4.22–5.81)
RDW: 14.1 % (ref 11.5–15.5)
WBC: 9.7 10*3/uL (ref 4.0–10.5)
nRBC: 0 % (ref 0.0–0.2)

## 2018-08-22 LAB — MRSA PCR SCREENING: MRSA by PCR: NEGATIVE

## 2018-08-22 LAB — STREP PNEUMONIAE URINARY ANTIGEN: Strep Pneumo Urinary Antigen: NEGATIVE

## 2018-08-22 LAB — TYPE AND SCREEN
ABO/RH(D): O POS
Antibody Screen: NEGATIVE

## 2018-08-22 LAB — HEMOGLOBIN AND HEMATOCRIT, BLOOD
HCT: 28 % — ABNORMAL LOW (ref 39.0–52.0)
Hemoglobin: 9.6 g/dL — ABNORMAL LOW (ref 13.0–17.0)

## 2018-08-22 LAB — ABO/RH: ABO/RH(D): O POS

## 2018-08-22 LAB — HIV ANTIBODY (ROUTINE TESTING W REFLEX): HIV Screen 4th Generation wRfx: NONREACTIVE

## 2018-08-22 MED ORDER — ORAL CARE MOUTH RINSE
15.0000 mL | Freq: Two times a day (BID) | OROMUCOSAL | Status: DC
  Administered 2018-08-22 – 2018-08-23 (×2): 15 mL via OROMUCOSAL

## 2018-08-22 MED ORDER — BISACODYL 10 MG RE SUPP
10.0000 mg | Freq: Once | RECTAL | Status: AC
  Administered 2018-08-22: 10 mg via RECTAL
  Filled 2018-08-22: qty 1

## 2018-08-22 MED ORDER — PANTOPRAZOLE SODIUM 40 MG IV SOLR
40.0000 mg | Freq: Two times a day (BID) | INTRAVENOUS | Status: DC
  Administered 2018-08-22: 40 mg via INTRAVENOUS
  Filled 2018-08-22: qty 40

## 2018-08-22 MED ORDER — CLONIDINE HCL 0.1 MG PO TABS
0.1000 mg | ORAL_TABLET | Freq: Two times a day (BID) | ORAL | Status: DC
  Administered 2018-08-22 – 2018-08-23 (×2): 0.1 mg via ORAL
  Filled 2018-08-22 (×2): qty 1

## 2018-08-22 MED ORDER — CARVEDILOL 3.125 MG PO TABS
3.1250 mg | ORAL_TABLET | Freq: Two times a day (BID) | ORAL | Status: DC
  Administered 2018-08-22 – 2018-08-23 (×2): 3.125 mg via ORAL
  Filled 2018-08-22 (×2): qty 1

## 2018-08-22 MED ORDER — HYDROMORPHONE HCL 1 MG/ML IJ SOLN
1.0000 mg | INTRAMUSCULAR | Status: DC | PRN
  Administered 2018-08-22 – 2018-08-23 (×3): 1 mg via INTRAVENOUS
  Filled 2018-08-22 (×3): qty 1

## 2018-08-22 MED ORDER — METRONIDAZOLE IN NACL 5-0.79 MG/ML-% IV SOLN
500.0000 mg | Freq: Three times a day (TID) | INTRAVENOUS | Status: DC
  Administered 2018-08-22: 500 mg via INTRAVENOUS
  Filled 2018-08-22: qty 100

## 2018-08-22 MED ORDER — LORAZEPAM 2 MG/ML IJ SOLN
1.0000 mg | INTRAMUSCULAR | Status: DC | PRN
  Administered 2018-08-23 (×2): 1 mg via INTRAVENOUS
  Filled 2018-08-22 (×2): qty 1

## 2018-08-22 NOTE — Progress Notes (Signed)
PROGRESS NOTE  Darryl [name]:[id number] DOB: 03-09-1935 DOA: 08/21/2018 PCP: [name], MD   LOS: 1 day   Brief Narrative / Interim history: 84 year old male with history of hypertension, dementia, LBBB, CAD status post CABG and PCI, recurrent UTIs, urinary retention and Foley dependent now, gastritis and peptic ulcer presenting with acute on chronic encephalopathy, fever, productive cough and chills and found to have right upper lobe pneumonia on chest x-ray. Patient was septic on presentation with altered mental status, hypotension, mild lactic acidosis tachycardia and tachypnea.  Influenza PCR negative.  Strep pneumo urinary antigen negative.  Chest with moderate blood and rare bacteria.  Status post cefepime, Flagyl and vancomycin ED.  Transition to Rocephin and azithromycin on admission.  Subjective: No major events overnight.  Patient is somehow irritable to staff and family member.  Denies chest pain, dyspnea or abdominal pain.  He is somehow confused on top of his underlying dementia to provide reliable history.  Wife at bedside reports ongoing melena.  She also reports couple of episodes of emesis and some concern about dysphagia.   Assessment & Plan: Principal Problem:   Community acquired pneumonia of right upper lobe of lung ([place]) Active Problems:   Benign essential HTN   Gastritis   Peptic ulcer disease   Urinary retention   Chronic diastolic heart failure (HCC)   Acute metabolic encephalopathy   CKD (chronic kidney disease) stage 3, GFR 30-59 ml/min (HCC)   Dementia (HCC)  Sepsis due to pneumonia and possible urinary tract infection.  Lactic acidosis resolved. -Cefepime, Flagyl and vancomycin in ED. -Transition to ceftriaxone and azithromycin 2/23--> -Add Flagyl for anaerobic coverage given concern about emesis and aspiration. -Follow cultures and narrow antibiotic as appropriate -SLP eval for dysphagia -Follow cultures. -Continue normal saline at 100  cc/h. -Adjust home blood pressure medications  Hypertension: Normotensive. -Adjust home blood pressure medications  Acute on chronic metabolic encephalopathy patient with history of chronic dementia: Likely due to infectious process as above -Delirium precaution -Treat reversible conditions as above. -Avoid sedating medications. -SLP eval.  Blood loss anemia/melanotic stool/history of gastritis and peptic ulcer: Wife reports melena.  Hemoglobin 15 on 2/10.  11 on admission and down to 10 this morning.  -2 peripheral IV lines. -IV Protonix twice daily -DC aspirin and Lovenox -[facility] consulted -Type and screen  Urinary retention:  -Continue Foley and Flomax. -Follow urine cultures.  CKD 3: Stable. -Continue monitoring  Chronic diastolic CHF/chronic LBBB: Stable.  Signs of fluid overload. -Home medications adjusted due to blood pressure. -Daily weight, intake output  Goal of care: DNR/DNI.  Patient connected with community hospice.  Palliative care consulted per wife's request.  Scheduled Meds: . azithromycin  500 mg Oral QHS  . carvedilol  3.125 mg Oral BID  . citalopram  20 mg Oral q morning - 10a  . cloNIDine  0.1 mg Oral BID  . mouth rinse  15 mL Mouth Rinse BID  . pantoprazole (PROTONIX) IV  40 mg Intravenous Q12H  . QUEtiapine  100 mg Oral QHS  . tamsulosin  0.4 mg Oral Daily  . temazepam  15 mg Oral QHS   Continuous Infusions: . sodium chloride 100 mL/hr at 08/22/18 0600  . cefTRIAXone (ROCEPHIN)  IV 200 mL/hr at 08/22/18 0600   PRN Meds:.acetaminophen, HYDROcodone-acetaminophen, ondansetron (ZOFRAN) IV  DVT prophylaxis: SCD Code Status: DNR Family Communication: Wife at bedside Disposition Plan: Remains inpatient  Consultants:   Palliative care  Gastroenterology  Procedures:   None  Antimicrobials:  As above  Objective: Vitals:   08/22/18 0900 08/22/18 0912 08/22/18 1000 08/22/18 1107  BP: (!) 143/72 (!) 143/72 (!) 164/66 (!) 116/59    Pulse: 73  73 63  Resp: 15  20 11   Temp:      TempSrc:      SpO2: 95%  97% 96%  Weight:      Height:        Intake/Output Summary (Last 24 hours) at 08/22/2018 1204 Last data filed at 08/22/2018 0900 Gross per 24 hour  Intake 1236.3 ml  Output 490 ml  Net 746.3 ml   Filed Weights   08/21/18 1919 08/21/18 2200  Weight: 80.3 kg 80.3 kg    Examination:  GENERAL: Somewhat irritable.  No acute distress. EYES - vision grossly intact. Sclera anicteric.  NOSE- no gross deformity or drainage LUNGS:  No IWOB.  Fair air movement bilaterally. Mild rhonchi.  HEART: RRR.  Heart sounds normal. ABD: Bowel sounds present. Soft.  Diffuse tenderness mainly over epigastric area. MSK/EXT: Moves extremities. No obvious deformity. SKIN: no apparent skin lesion.  NEURO: Awake, alert and oriented only to self.  Intermittently confused.  Data Reviewed: I have independently reviewed following labs and imaging studies   CBC: Recent Labs  Lab 08/21/18 1927 08/22/18 0324 08/22/18 1124  WBC 8.9 9.7  --   NEUTROABS 7.3  --   --   HGB 11.0* 9.9* 9.6*  HCT 31.4* 29.6* 28.0*  MCV 92.4 95.2  --   PLT 127* 113*  --    Basic Metabolic Panel: Recent Labs  Lab 08/21/18 1927 08/22/18 0324  NA 141 142  K 3.3* 4.1  CL 112* 114*  CO2 23 25  GLUCOSE 156* 164*  BUN 28* 24*  CREATININE 1.26* 1.43*  CALCIUM 8.7* 8.2*   GFR: Estimated Creatinine Clearance: 41.8 mL/min (A) (by C-G formula based on SCr of 1.43 mg/dL (H)). Liver Function Tests: Recent Labs  Lab 08/21/18 1927  AST 20  ALT 19  ALKPHOS 89  BILITOT 0.7  PROT 6.5  ALBUMIN 3.4*   Recent Labs  Lab 08/21/18 1953  LIPASE 35   No results for input(s): AMMONIA in the last 168 hours. Coagulation Profile: Recent Labs  Lab 08/21/18 1927  INR 1.02   Cardiac Enzymes: No results for input(s): CKTOTAL, CKMB, CKMBINDEX, TROPONINI in the last 168 hours. BNP (last 3 results) No results for input(s): PROBNP in the last 8760  hours. HbA1C: No results for input(s): HGBA1C in the last 72 hours. CBG: No results for input(s): GLUCAP in the last 168 hours. Lipid Profile: No results for input(s): CHOL, HDL, LDLCALC, TRIG, CHOLHDL, LDLDIRECT in the last 72 hours. Thyroid Function Tests: No results for input(s): TSH, T4TOTAL, FREET4, T3FREE, THYROIDAB in the last 72 hours. Anemia Panel: No results for input(s): VITAMINB12, FOLATE, FERRITIN, TIBC, IRON, RETICCTPCT in the last 72 hours. Urine analysis:    Component Value Date/Time   COLORURINE YELLOW 08/21/2018 1927   APPEARANCEUR HAZY (A) 08/21/2018 1927   LABSPEC 1.018 08/21/2018 1927   PHURINE 6.0 08/21/2018 1927   GLUCOSEU NEGATIVE 08/21/2018 1927   HGBUR MODERATE (A) 08/21/2018 1927   BILIRUBINUR NEGATIVE 08/21/2018 [place] 08/21/2018 1927   PROTEINUR 30 (A) 08/21/2018 1927   UROBILINOGEN 1.0 10/11/2014 1609   NITRITE NEGATIVE 08/21/2018 1927   LEUKOCYTESUR LARGE (A) 08/21/2018 1927   Sepsis Labs: Invalid input(s): PROCALCITONIN, LACTICIDVEN  No results found for this or any previous visit (from the past 240 hour(s)).  Radiology Studies: Dg Chest 2 View  Result Date: 08/21/2018 CLINICAL DATA:  Sepsis EXAM: CHEST - 2 VIEW COMPARISON:  04/06/2018 FINDINGS: Prior CABG. Heart is normal size. Concern for patchy opacity in the right upper lobe concerning for pneumonia. Left lung clear. No effusions or acute bony abnormality. IMPRESSION: Concern for right upper lobe infiltrate/pneumonia. Electronically Signed   By: Rolm Baptise M.D.   On: 08/21/2018 19:49   Dg Abd 1 View  Result Date: 08/22/2018 CLINICAL DATA:  Left lower quadrant abdominal pain. EXAM: ABDOMEN - 1 VIEW COMPARISON:  Abdominal x-ray dated August 17, 2018. FINDINGS: Normal bowel gas pattern. Unchanged 7 mm left renal calculus. No acute osseous abnormality. IMPRESSION: 1. No acute findings. 2. Unchanged left nephrolithiasis. Electronically Signed   By: Titus Dubin M.D.    On: 08/22/2018 09:04     T. [facility] Pager [phone]  If 7PM-7AM, please contact night-coverage [email or web address] Password [facility] 08/22/2018, 12:04 PM

## 2018-08-22 NOTE — Consult Note (Signed)
Referring Provider:  [place] Primary Care Physician:  [name], MD Primary Gastroenterologist: Althia Forts  Reason for Consultation: Drop in hemoglobin, possible upper GI bleed  HPI: Darryl [name] is a 84 y.o. male with past medical history of dementia, history of recurrent UTIs, history of coronary artery disease admitted to the hospital with altered mental status and fever.  He was subsequently diagnosed with pneumonia.  He was noted to have a drop in hemoglobin.  There was concern for dark-colored stool.  GI is consulted for further evaluation.  Patient seen and examined at bedside.  Most of the history obtained with the help of wife.  According to her, patient had black-colored stool on Friday.  No bowel movement since then.  Denies bright red blood per rectum.  Had nausea and vomiting on Thursday there was no blood in the vomiting.  Patient was complaining of lower abdominal discomfort.  Also complaining of chronic constipation takes laxative on a daily basis.    Past Medical History:  Diagnosis Date  . Arthritis   . BPH (benign prostatic hypertrophy) with urinary retention   . Chronic diastolic CHF (congestive heart failure) ([place])   . Colon polyp   . Coronary atherosclerosis of native coronary artery    a. s/p CABG 11/2003. b. s/p DES to radial-OM and SVG-RCA 11/05. c. s/p BMS to LCx 11/2008.  . Dilated aortic root ([place]) 05/29/2014   109mm by echo 04/2017  . Diverticulosis   . Dyslipidemia    statin intolerant  . Elevated fasting glucose   . Gastritis    a. by EGD 04/2004.  . H/O urinary retention    post surgical procedures; has foley cath  . Headache(784.0)   . Hiatal hernia   . HOH (hard of hearing)    slight hoh, rt>l  . HTN (hypertension)   . Hypertensive heart disease   . Insomnia   . Intracranial hemorrhage ([place])    a. small pontine [place] 2003.  [name] LBBB (left bundle branch block)   . Lumbar spondylolysis    multilevel DJD with mild spinal stenosis L3-L5  . Migraines    . Parkinson's disease ([place])   . Peptic ulcer disease    a. prior hx of bleeding ulcers, including 12/2003 in setting of aspirin use.  . Renal insufficiency    renal duplex scan in 2012 showed no renal artery stenosis  . Sleep disturbance   . Stroke [facility])    no clinical manifestations - seen on neuroimaging  . Thrombocytopenia ([place])   . Tremor   . UTI (urinary tract infection)   . Wears glasses     Past Surgical History:  Procedure Laterality Date  . APPENDECTOMY  62   1963  . CATARACT EXTRACTION, BILATERAL    . CHOLECYSTECTOMY  1998  . COLON SURGERY  03/14/2008  . COLONOSCOPY  10/2001  . CORONARY ANGIOPLASTY  6/10,10/05   Dr. [name] cardiologist: last office visit 11/07/14  . CORONARY ARTERY BYPASS GRAFT  2005  . CYSTOSCOPY WITH LITHOLAPAXY N/A 11/19/2014   Procedure: CYSTOSCOPY WITH LITHOLAPAXY;  Surgeon: [name], MD;  Location: [facility];  Service: Urology;  Laterality: N/A;  . ESOPHAGOGASTRODUODENOSCOPY     10/2001, 12/2003, 04/2004  . LUMBAR LAMINECTOMY/DECOMPRESSION MICRODISCECTOMY N/A 05/03/2013   Procedure: LUMBAR LAMINECTOMY/DCOMPRESSION MICRODISCECTOMY RIGHT LUMBAR FOUR FIVE;  Surgeon: [name], MD;  Location: [place] [facility];  Service: Neurosurgery;  Laterality: N/A;  . SHOULDER ARTHROSCOPY Left 9/10  . stones  hx  . TRANSURETHRAL RESECTION OF PROSTATE N/A 11/19/2014   Procedure: TRANSURETHRAL RESECTION OF THE PROSTATE WITH GYRUS INSTRUMENTS;  Surgeon: [name], MD;  Location: [place];  Service: Urology;  Laterality: N/A;  . WISDOM TOOTH EXTRACTION      Prior to Admission medications   Medication Sig Start Date End Date Taking? Authorizing Provider  acetaminophen (TYLENOL) 500 MG tablet Take 1,000 mg by mouth every 8 (eight) hours as needed for mild pain or moderate pain.   Yes [provider]  aspirin EC 81 MG tablet Take 81 mg by mouth. Patient takes 3 times a week   Yes [provider]   carvedilol (COREG) 6.25 MG tablet Take 1 tablet (6.25 mg total) by mouth 2 (two) times daily. 03/23/18  Yes Turner, Eber Hong, MD  citalopram (CELEXA) 20 MG tablet Take 20 mg by mouth every morning.    Yes [provider]  cloNIDine (CATAPRES) 0.2 MG tablet Take 1.5 tablets (0.3 mg total) by mouth 2 (two) times daily. 03/23/18 03/23/19 Yes Turner, Eber Hong, MD  furosemide (LASIX) 20 MG tablet Take 1 tablet (20 mg total) by mouth daily. 03/23/18 03/18/19 Yes Turner, Eber Hong, MD  hydrALAZINE (APRESOLINE) 25 MG tablet Take 1 tablet (25 mg total) by mouth 3 (three) times daily. 08/31/17 08/26/18 Yes Turner, Eber Hong, MD  HYDROcodone-acetaminophen (NORCO/VICODIN) 5-325 MG tablet Take 1-2 tablets by mouth every 4 (four) hours as needed for moderate pain. 04/08/18  Yes Charlynne Cousins, MD  irbesartan (AVAPRO) 300 MG tablet Take 300 mg by mouth at bedtime.    Yes [provider]  pantoprazole (PROTONIX) 40 MG tablet Take 40 mg by mouth 3 (three) times a week.    Yes [provider]  Probiotic Product (PROBIOTIC DAILY PO) Take 1 capsule by mouth daily.   Yes [provider]  QUEtiapine (SEROQUEL) 100 MG tablet Take 100 mg by mouth at bedtime. 08/10/18  Yes [provider]  SENNA PLUS 8.6-50 MG tablet Take 2 tablets by mouth daily. 08/10/18  Yes [provider]  tamsulosin (FLOMAX) 0.4 MG CAPS capsule Take 0.4 mg by mouth daily. 02/01/18  Yes [provider]  temazepam (RESTORIL) 15 MG capsule Take 15 mg by mouth every evening. 08/10/18  Yes [provider]  nitroGLYCERIN (NITROSTAT) 0.4 MG SL tablet Place 1 tablet (0.4 mg total) under the tongue every 5 (five) minutes as needed for chest pain. 08/31/17   Sueanne Margarita, MD    Scheduled Meds: . azithromycin  500 mg Oral QHS  . carvedilol  3.125 mg Oral BID  . citalopram  20 mg Oral q morning - 10a  . cloNIDine  0.1 mg Oral BID  . mouth rinse  15 mL Mouth Rinse BID  . pantoprazole (PROTONIX) IV   40 mg Intravenous Q12H  . QUEtiapine  100 mg Oral QHS  . tamsulosin  0.4 mg Oral Daily  . temazepam  15 mg Oral QHS   Continuous Infusions: . sodium chloride 100 mL/hr at 08/22/18 0600  . cefTRIAXone (ROCEPHIN)  IV 200 mL/hr at 08/22/18 0600   PRN Meds:.acetaminophen, HYDROcodone-acetaminophen, ondansetron (ZOFRAN) IV  Allergies as of 08/21/2018 - Review Complete 08/21/2018  Allergen Reaction Noted  . Ambien [zolpidem tartrate] Other (See Comments) 11/19/2014  . Aspirin Other (See Comments) 04/27/2013  . Celebrex [celecoxib] Other (See Comments) 04/10/2013  . Nsaids Other (See Comments) 04/10/2013  . Toprol xl [metoprolol tartrate] Swelling 04/10/2013  . Altace [ramipril]  04/10/2013  .  Amlodipine  10/25/2014  . Levothyroxine Other (See Comments) 08/11/2013  . Biaxin [clarithromycin] Nausea Only 04/10/2013  . Tetracyclines & related Nausea Only 04/10/2013    Family History  Problem Relation Age of Onset  . CAD Father 30       ASCAD  . CAD Sister   . Diabetes Child     Social History   Socioeconomic History  . Marital status: Married    Spouse name: [name]  . Number of children: 4  . Years of education: College  . Highest education level: Not on file  Occupational History  . Occupation: retired    Comment: Psychiatrist  Social Needs  . Financial resource strain: Not on file  . Food insecurity:    Worry: Not on file    Inability: Not on file  . Transportation needs:    Medical: Not on file    Non-medical: Not on file  Tobacco Use  . Smoking status: Former Smoker    Packs/day: 2.00    Years: 5.00    Pack years: 10.00    Types: Cigarettes    Last attempt to quit: 04/28/1959    Years since quitting: 59.3  . Smokeless tobacco: Never Used  . Tobacco comment: 55+ yrs ago  Substance and Sexual Activity  . Alcohol use: No  . Drug use: No  . Sexual activity: Not on file  Lifestyle  . Physical activity:    Days per week: Not on file    Minutes per  session: Not on file  . Stress: Not on file  Relationships  . Social connections:    Talks on phone: Not on file    Gets together: Not on file    Attends religious service: Not on file    Active member of club or organization: Not on file    Attends meetings of clubs or organizations: Not on file    Relationship status: Not on file  . Intimate partner violence:    Fear of current or ex partner: Not on file    Emotionally abused: Not on file    Physically abused: Not on file    Forced sexual activity: Not on file  Other Topics Concern  . Not on file  Social History Narrative   Patient is married to [name]), has 4 children   Patient is right handed   Education level is college   Caffeine consumption is none    Review of Systems: All negative except as stated above in HPI.  Physical Exam: Vital signs: Vitals:   08/22/18 1000 08/22/18 1107  BP: (!) 164/66 (!) 116/59  Pulse: 73 63  Resp: 20 11  Temp:    SpO2: 97% 96%     Physical Exam  Constitutional: No distress.  Elderly-appearing patient,  HENT:  Head: Normocephalic and atraumatic.  Eyes: EOM are normal. No scleral icterus.  Neck: Normal range of motion. Neck supple.  Cardiovascular: Normal rate.  Murmur heard. Pulmonary/Chest: Effort normal and breath sounds normal.  Abdominal: Soft. Bowel sounds are normal. He exhibits no distension. There is no abdominal tenderness. There is no rebound.  Musculoskeletal: Normal range of motion.        General: No edema.    GI:  Lab Results: Recent Labs    08/21/18 1927 08/22/18 0324 08/22/18 1124  WBC 8.9 9.7  --   HGB 11.0* 9.9* 9.6*  HCT 31.4* 29.6* 28.0*  PLT 127* 113*  --    BMET Recent Labs  08/21/18 1927 08/22/18 0324  NA 141 142  K 3.3* 4.1  CL 112* 114*  CO2 23 25  GLUCOSE 156* 164*  BUN 28* 24*  CREATININE 1.26* 1.43*  CALCIUM 8.7* 8.2*   LFT Recent Labs    08/21/18 1927  PROT 6.5  ALBUMIN 3.4*  AST 20  ALT 19  ALKPHOS 89  BILITOT 0.7    PT/INR Recent Labs    08/21/18 1927  LABPROT 13.3  INR 1.02     Studies/Results: Dg Chest 2 View  Result Date: 08/21/2018 CLINICAL DATA:  Sepsis EXAM: CHEST - 2 VIEW COMPARISON:  04/06/2018 FINDINGS: Prior CABG. Heart is normal size. Concern for patchy opacity in the right upper lobe concerning for pneumonia. Left lung clear. No effusions or acute bony abnormality. IMPRESSION: Concern for right upper lobe infiltrate/pneumonia. Electronically Signed   By: Rolm Baptise M.D.   On: 08/21/2018 19:49   Dg Abd 1 View  Result Date: 08/22/2018 CLINICAL DATA:  Left lower quadrant abdominal pain. EXAM: ABDOMEN - 1 VIEW COMPARISON:  Abdominal x-ray dated August 17, 2018. FINDINGS: Normal bowel gas pattern. Unchanged 7 mm left renal calculus. No acute osseous abnormality. IMPRESSION: 1. No acute findings. 2. Unchanged left nephrolithiasis. Electronically Signed   By: Titus Dubin M.D.   On: 08/22/2018 09:04    Impression/Plan: -Melena with drop in hemoglobin.  No bowel movement in last 3 days. -Constipation. -Pneumonia -Dementia -Lower abdominal discomfort.  Could be from constipation.  CT scan abdomen pelvis with contrast on August 09, 2018 showed nonobstructive left renal stone otherwise no acute changes.  Recommendations ------------------------ -Patient presentation is not consistent with acute ulcer bleeding.  No bowel movement in last 2 to 3 days.  Reported history of black-colored stool few days ago.  He does have a drop in hemoglobin. -Different management options such as conservative management without any further testing or imaging, upper GI series as well as endoscopy discussed with the patient.  Patient's wife prefers endoscopy for further evaluation.  Risks (bleeding, infection, bowel perforation that could require surgery, sedation-related changes in cardiopulmonary systems), benefits (identification and possible treatment of source of symptoms, exclusion of certain causes  of symptoms), and alternatives (watchful waiting, radiographic imaging studies, empiric medical treatment)  were explained to family in detail and patient wishes to proceed.  -Plan for EGD tomorrow. -Repeat CBC in the morning.  GI will follow   LOS: 1 day   Otis Brace  MD, FACP 08/22/2018, 12:00 PM  Contact #  [phone]

## 2018-08-22 NOTE — Care Management Note (Signed)
Case Management Note  Patient Details  Name: Darryl [name] MRN: [id number] Date of Birth: [birth year]  Subjective/Objective:                  Discharge Readiness Return to top of Pneumonia RRG - [place]  Discharge readiness is indicated by patient meeting Recovery Milestones, including ALL of the following: ? Hemodynamic stability NO TEMP 103 ? Tachypnea absent RR-30 ? Hypoxemia absent  ABSENT ? Afebrile, or temperature acceptable for next level of care NO TEMP 103 ? Oxygen absent or at baseline need ON ROOM AIR ? Mental status at baseline YES ? Antibiotic regimen acceptable for next level of care IV ROCEPHIN ? Ambulatory NOT AT THIS TIME ? Oral hydration, medications, and diet  IV FLDS AND IV ABX AS ABOVE   Action/Plan: Will follow for progression of care and clinical status. Will follow for case management needs none present at this time.  Expected Discharge Date:                  Expected Discharge Plan:     In-House Referral:     Discharge planning Services     Post Acute Care Choice:    Choice offered to:     DME Arranged:    DME Agency:     HH Arranged:    HH Agency:     Status of Service:     If discussed at [employer], dates discussed:    Additional Comments:  Leeroy Cha, RN 08/22/2018, 10:26 AM

## 2018-08-22 NOTE — Progress Notes (Signed)
COMMUNITY PALLIATIVE CARE SW NOTE  PATIENT NAME: Darryl [name] DOB: October 01, 1934 MRN: [id number]  PRIMARY CARE PROVIDER: [name], MD  RESPONSIBLE PARTY:  Acct ID - Guarantor Home Phone Work Phone Relationship Acct Type  [id number] KEYLAN, [id number]  Self P/F     [address], [place], [place] [address]     PLAN OF CARE and INTERVENTIONS:             1. GOALS OF CARE/ ADVANCE CARE PLANNING:  Goal is for patient to remain at home as long as possible.  Patient has a DNR. 2. SOCIAL/EMOTIONAL/SPIRITUAL ASSESSMENT/ INTERVENTIONS:  SW and Palliative Care RN, Darryl [name], met with patient and his wife, Darryl [name], in their home.  Patient was in his recliner.  He denied pain and answered questions.  When he did not know the answer, he would state so.  He stated several times that he loved his wife.  He appeared to respond positively to social interaction 3. PATIENT/CAREGIVER EDUCATION/ COPING:  Patient copes by expressing his feelings openly.  SW provided education regarding the Medicaid process. 4. PERSONAL EMERGENCY PLAN:  Patient's wife remains with him.  She will contact [place] or EMS as needed. 5. COMMUNITY RESOURCES COORDINATION/ HEALTH CARE NAVIGATION:  None. 6. FINANCIAL/LEGAL CONCERNS/INTERVENTIONS:  Couple is on a fixed income.     SOCIAL HX:  Social History   Tobacco Use  . Smoking status: Former Smoker    Packs/day: 2.00    Years: 5.00    Pack years: 10.00    Types: Cigarettes    Last attempt to quit: 04/28/1959    Years since quitting: 59.3  . Smokeless tobacco: Never Used  . Tobacco comment: 55+ yrs ago  Substance Use Topics  . Alcohol use: No    CODE STATUS:   Code Status: DNR  ADVANCED DIRECTIVES: N MOST FORM COMPLETE:  N HOSPICE EDUCATION PROVIDED:  Patient was previously under Hospice care. PPS:  Patient's appetite is normal.  He can stand independently and uses his walker. Duration of visit and documentation:  75 minutes.      Darryl Corn Raela Bohl,  LCSW

## 2018-08-22 NOTE — Progress Notes (Signed)
[facility]  Received referral for residential hospice at [facility] from [place].    Spoke with family, confirmed interest.  We will have a bed to offer on 2/25.  Necessary registration paperwork scheduled to be completed on 2/25 @ 9am.   Please fax discharge summary to [phone]  RN staff, please call report to [phone]  [facility] hospital liaison will notify LCSW once paperwork has been completed and transportation can be arranged.  Thank you, Venia Carbon BSN, RN [place] Liaison [phone]

## 2018-08-22 NOTE — Consult Note (Signed)
Palliative Care Consult Note Reason: Goals of Care  Darryl [name] is an 84 yo man recently discharged from hospice services [facility]) January 2020, followed by [facility], 2 recent admissions - kidney stones, urinary retention with acute delirium and has for 2 weeks not been eating, not able to walk and over all declining rapidly.  I had extensive discussion with his wife Darryl [name] on his QOL, goals of care and what her expectations were for recovery and the future. She says his QOL is very poor, he is not able to interact verbally, and has been sleeping more and more, not able to ambulate independently. We discussed that likely his condition currently is serious and may lead to EOL. We are likely prolonging his life in a very poor state of health by continuing to treat him aggressively for sepsis, GI bleeding etc. He has explicitly in the past stated he would not want this.  I offered the choice of transitioning to comfort care care as a compassionate and medically reasonable choice at this point. His wife agrees that this is the best option.  Recommendations:  1. Transition to comfort care 2. Transfer to [facility], referral placed 3. Allow for a Natural death to occur 4. Add on pain and sedation PRNs 5. Wife would like for him to be checked for impaction since he has not had a bowel movement in 4 days.  6. Maintain Foley for comfort  Lane Hacker, DO Palliative Medicine  Time: 70 min Greater than 50%  of this time was spent counseling and coordinating care related to the above assessment and plan.

## 2018-08-22 NOTE — Evaluation (Signed)
Clinical/Bedside Swallow Evaluation Patient Details  Name: Darryl [name] MRN: [id number] Date of Birth: 1935/05/27  Today's Date: 08/22/2018 Time: SLP Start Time (ACUTE ONLY): 0940 SLP Stop Time (ACUTE ONLY): 1034 SLP Time Calculation (min) (ACUTE ONLY): 54 min  Past Medical History:  Past Medical History:  Diagnosis Date  . Arthritis   . BPH (benign prostatic hypertrophy) with urinary retention   . Chronic diastolic CHF (congestive heart failure) ([place])   . Colon polyp   . Coronary atherosclerosis of native coronary artery    a. s/p CABG 11/2003. b. s/p DES to radial-OM and SVG-RCA 11/05. c. s/p BMS to LCx 11/2008.  . Dilated aortic root ([place]) 05/29/2014   82mm by echo 04/2017  . Diverticulosis   . Dyslipidemia    statin intolerant  . Elevated fasting glucose   . Gastritis    a. by EGD 04/2004.  . H/O urinary retention    post surgical procedures; has foley cath  . Headache(784.0)   . Hiatal hernia   . HOH (hard of hearing)    slight hoh, rt>l  . HTN (hypertension)   . Hypertensive heart disease   . Insomnia   . Intracranial hemorrhage ([place])    a. small pontine [place] 2003.  [name] LBBB (left bundle branch block)   . Lumbar spondylolysis    multilevel DJD with mild spinal stenosis L3-L5  . Migraines   . Parkinson's disease ([place])   . Peptic ulcer disease    a. prior hx of bleeding ulcers, including 12/2003 in setting of aspirin use.  . Renal insufficiency    renal duplex scan in 2012 showed no renal artery stenosis  . Sleep disturbance   . Stroke [facility])    no clinical manifestations - seen on neuroimaging  . Thrombocytopenia ([place])   . Tremor   . UTI (urinary tract infection)   . Wears glasses    Past Surgical History:  Past Surgical History:  Procedure Laterality Date  . APPENDECTOMY  60   1963  . CATARACT EXTRACTION, BILATERAL    . CHOLECYSTECTOMY  1998  . COLON SURGERY  03/14/2008  . COLONOSCOPY  10/2001  . CORONARY ANGIOPLASTY  6/10,10/05   Dr. [name]  cardiologist: last office visit 11/07/14  . CORONARY ARTERY BYPASS GRAFT  2005  . CYSTOSCOPY WITH LITHOLAPAXY N/A 11/19/2014   Procedure: CYSTOSCOPY WITH LITHOLAPAXY;  Surgeon: [name], MD;  Location: [facility];  Service: Urology;  Laterality: N/A;  . ESOPHAGOGASTRODUODENOSCOPY     10/2001, 12/2003, 04/2004  . LUMBAR LAMINECTOMY/DECOMPRESSION MICRODISCECTOMY N/A 05/03/2013   Procedure: LUMBAR LAMINECTOMY/DCOMPRESSION MICRODISCECTOMY RIGHT LUMBAR FOUR FIVE;  Surgeon: [name], MD;  Location: [facility];  Service: Neurosurgery;  Laterality: N/A;  . SHOULDER ARTHROSCOPY Left 9/10  . stones     hx  . TRANSURETHRAL RESECTION OF PROSTATE N/A 11/19/2014   Procedure: TRANSURETHRAL RESECTION OF THE PROSTATE WITH GYRUS INSTRUMENTS;  Surgeon: [name], MD;  Location: [facility];  Service: Urology;  Laterality: N/A;  . WISDOM TOOTH EXTRACTION     HPI:  84 yo male adm to [facility] increased confusion and fevers with n/v  found to have right upper lobe pna. Recent admit for UTI = foley cath in place and pt tx with ABX.  PMH + for dementia, Parkinsons, recurrent UTI - s/p foley, gastritis 2005, hiatal hernia, BPH, asymptomatic cva- small pontine 2003, migraines.  Swallow eval ordered.  RN reports hemoglobin keeps going down and spouse reports pt with  melena.  CT abdomen showed mild edematous distal stomach 08/09/2018 - pt is on a PPI.     Assessment / Plan / Recommendation Clinical Impression  Pt known to this SLP from prior hospital admit and prior [facility] 10/2017.  He is sleepy but would awaken to partcipate today.  Pt's voice is clear without wet quality nor hoarseness but mildly weak.  No focal CN deficits apparent during testing that pt would allow participation.    He was observed with minimal intake as he kept his eyes closed most of the evaluation and benefited from max to total assist to eat/drink.  No indication of aspiration with po observed * pt has h/o silent  aspiration/penetration on [facility] 10/2017.  Delayed swallow suspected with increased shallow RR immediately post-swallow of solid/puree.  In addition, pt appeared with subtle "gag" during intake of solid/puree- ? GI source - he denies gagging nor esophageal/GI issues.    Pt has been noncompliant with nectar thick liquids and thus do not recommend their use - especially given his renal issues at this time -Risks may outweigh benefit of thick liquids.  Recommend to modify diet to full liquid at this time due to pt's appearance of increased comfort and efficiency with liquids compared to solids.  Wife agreeable - Pt has dementia and uncertain of his capacity to help with medical decisions.  SLP inquired re: possible palliative referral - to which wife stated "why not?".  Advised MD of wife's wishes - She provides total care for this pt who has gone from walking with walker to needing assist to transit to bedside commode over the last few weeks *loose stools per wife*.  He was able to feed himself prior to admit.     Will follow up for education, tolerance and to follow palliative input.  Again chronicity of aspiration risk present with increased pulmonary ramfications as multiple comorbidities present and recurrent hospital admits. Wife agreeable to plan.   Thanks for this consult.   SLP Visit Diagnosis: Dysphagia, pharyngeal phase (R13.13);Dysphagia, unspecified (R13.10)(? GI component)    Aspiration Risk  Moderate aspiration risk    Diet Recommendation Thin liquid(full liquids)   Liquid Administration via: Cup;No straw;Spoon Medication Administration: Whole meds with puree Supervision: Staff to assist with self feeding Compensations: Slow rate;Small sips/bites Postural Changes: Seated upright at 90 degrees;Remain upright for at least 30 minutes after po intake    Other  Recommendations Oral Care Recommendations: Oral care BID(educated pt/wife on importance of oral care) Other Recommendations: Have  oral suction available   Follow up Recommendations (tbd)      Frequency and Duration min 1 x/week  2 weeks       Prognosis Prognosis for Safe Diet Advancement: Fair Barriers to Reach Goals: Time post onset;Behavior;Cognitive deficits;Motivation      Swallow Study   General Date of Onset: 08/22/18 HPI: 84 yo male adm to [facility] increased confusion and fevers with n/v  found to have right upper lobe pna. Recent admit for UTI = foley cath in place and pt tx with ABX.  PMH + for dementia, Parkinsons, recurrent UTI - s/p foley, gastritis 2005, hiatal hernia, BPH, asymptomatic cva- small pontine 2003, migraines.  Swallow eval ordered.  RN reports hemoglobin keeps going down and spouse reports pt with melena.  CT abdomen showed mild edematous distal stomach 08/09/2018 - pt is on a PPI.   Type of Study: Bedside Swallow Evaluation Previous Swallow Assessment: MBS 11/16/2017 + penetration and aspiration of thin and nectar, nectar  via straw - no aspiration, recommendation for dys3/nectar - cough after liquids, = high risk of aspiration due to parkinsons - pt refused to consume nectar liquids Diet Prior to this Study: Regular;Thin liquids Temperature Spikes Noted: Yes(103) Respiratory Status: Room air History of Recent Intubation: No Behavior/Cognition: Other (Comment);Agitated;Lethargic/Drowsy(but would awaken to SLP verbal/visual stimulation) Oral Cavity Assessment: Other (comment);Dried secretions(posterior soft palate secretions, assisted pt with oral care, dental brushing) Oral Care Completed by SLP: Yes Oral Cavity - Dentition: Adequate natural dentition Self-Feeding Abilities: Needs assist Patient Positioning: Upright in bed Baseline Vocal Quality: Low vocal intensity Volitional Cough: Strong Volitional Swallow: Unable to elicit    Oral/Motor/Sensory Function Overall Oral Motor/Sensory Function: Generalized oral weakness   Ice Chips Ice chips: Within functional limits Presentation: Spoon    Thin Liquid Thin Liquid: Impaired Presentation: Cup;Self Fed;Spoon(hand over hand assist with cup) Pharyngeal  Phase Impairments: Multiple swallows    Nectar Thick Nectar Thick Liquid: Not tested   Honey Thick Honey Thick Liquid: Not tested   Puree Puree: Within functional limits Presentation: Spoon Other Comments: ? subtle gagging, pt denies, increased RR with swallow breathing acutely after swallow    Solid     Solid: Impaired Oral Phase Impairments: Reduced lingual movement/coordination;Impaired mastication Oral Phase Functional Implications: Prolonged oral transit;Other (comment)(slow and ineffective mastication, oral holding and residuals - used puree to aid clearance) Pharyngeal Phase Impairments: Other (comments)(appearance of subtle gagging and wince with swallow, ? due to odynophagia - pt denies) Other Comments: increased RR with swallow breathing acutely after swallow - resolved within a minute      Macario Golds 08/22/2018,11:13 AM  Luanna Salk, MS [place] Pager [phone] Office [phone]

## 2018-08-23 ENCOUNTER — Encounter (HOSPITAL_COMMUNITY): Payer: Self-pay | Admitting: Anesthesiology

## 2018-08-23 ENCOUNTER — Encounter (HOSPITAL_COMMUNITY)
Admission: EM | Disposition: A | Discharge status: Hospice | Payer: Self-pay | Source: Home / Self Care | Attending: Student

## 2018-08-23 DIAGNOSIS — Z515 Encounter for palliative care: Secondary | ICD-10-CM

## 2018-08-23 LAB — BLOOD CULTURE ID PANEL (REFLEXED)

## 2018-08-23 LAB — URINE CULTURE

## 2018-08-23 SURGERY — ESOPHAGOGASTRODUODENOSCOPY (EGD) WITH PROPOFOL
Anesthesia: Monitor Anesthesia Care

## 2018-08-23 MED ORDER — CLONIDINE HCL 0.2 MG/24HR TD PTWK
0.2000 mg | MEDICATED_PATCH | TRANSDERMAL | Status: DC
  Filled 2018-08-23: qty 1

## 2018-08-23 MED ORDER — CHLORPROMAZINE HCL 25 MG PO TABS: 25.0000 mg | ORAL_TABLET | Freq: Four times a day (QID) | ORAL | 0 refills | Status: AC | PRN

## 2018-08-23 MED ORDER — HYDROMORPHONE HCL 4 MG PO TABS: 4.0000 mg | ORAL_TABLET | ORAL | 0 refills | Status: AC | PRN

## 2018-08-23 MED ORDER — LORAZEPAM 2 MG/ML PO CONC: 1.0000 mg | Freq: Four times a day (QID) | ORAL | 0 refills | Status: AC

## 2018-08-23 MED ORDER — CLONIDINE 0.2 MG/24HR TD PTWK: 0.2000 mg | MEDICATED_PATCH | TRANSDERMAL | 12 refills | Status: AC

## 2018-08-23 MED ORDER — LORAZEPAM 2 MG/ML PO CONC: 1.0000 mg | ORAL | 0 refills | Status: AC | PRN

## 2018-08-23 MED ORDER — HYDROMORPHONE HCL 4 MG PO TABS: 4.0000 mg | ORAL_TABLET | Freq: Four times a day (QID) | ORAL | 0 refills | Status: AC

## 2018-08-23 NOTE — Discharge Summary (Signed)
Physician Discharge Summary  KASSIDY FRANKSON UXL:[id number] DOB: [birth year] DOA: 08/21/2018  PCP: [name], MD  Admit date: 08/21/2018 Discharge date: 08/23/2018  Admitted From: home Disposition: [facility]  Discharge Condition: Comfort care CODE STATUS: DNR  Hospital Course: 84 year old male with history of hypertension, dementia, LBBB, CAD status post CABG and PCI, recurrent UTIs, urinary retention and Foley dependent now, gastritis and peptic ulcer presenting with acute on chronic encephalopathy, fever, productive cough and chills and found to have right upper lobe pneumonia on chest x-ray. Patient was septic on presentation with altered mental status, hypotension, mild lactic acidosis tachycardia and tachypnea.  Influenza PCR negative.  Strep pneumo urinary antigen negative.  Chest with moderate blood and rare bacteria.  Status post cefepime, Flagyl and vancomycin ED.  Transition to Rocephin and azithromycin on admission.   The next day, patient and family met with palliative care, and patient was transitioned to comfort care.  IV antibiotics discontinued.  Discharge Diagnoses:  Principal Problem:   Community acquired pneumonia of right upper lobe of lung ([place]) Active Problems:   Benign essential HTN   Gastritis   Peptic ulcer disease   Urinary retention   Chronic diastolic heart failure (HCC)   Acute metabolic encephalopathy   CKD (chronic kidney disease) stage 3, GFR 30-59 ml/min (HCC)   Dementia (HCC)  Sepsis due to pneumonia and possible urinary tract infection.  Lactic acidosis resolved.  Hypertension: Normotensive. -Catapres and hydralazine  Acute on chronic metabolic encephalopathy patient with history of chronic dementia:  Blood loss anemia/melanotic stool/history of gastritis and peptic ulcer  Urinary retention:  Discharged with Foley catheter  CKD 3: Stable.  Chronic diastolic CHF/chronic LBBB: Stable.  Signs of fluid overload.  Comfort care:  symptomatic management per palliative care.  Discharge Instructions  Discharge Instructions    Diet general   Complete by:  As directed      Allergies as of 08/23/2018      Reactions   Ambien [zolpidem Tartrate] Other (See Comments)   hallucinations   Aspirin Other (See Comments)   ulcers   Celebrex [celecoxib] Other (See Comments)   Due to ulcer.   Nsaids Other (See Comments)   Due to ulcer.   Toprol Xl [metoprolol Tartrate] Swelling   Altace [ramipril]    UNKNOWN   Amlodipine    Feet swell   Levothyroxine Other (See Comments)   "made him feel bad"   Biaxin [clarithromycin] Nausea Only   Tetracyclines & Related Nausea Only      Medication List    STOP taking these medications   acetaminophen 500 MG tablet Commonly known as:  TYLENOL   aspirin EC 81 MG tablet   carvedilol 6.25 MG tablet Commonly known as:  COREG   citalopram 20 MG tablet Commonly known as:  CELEXA   cloNIDine 0.2 MG tablet Commonly known as:  CATAPRES   hydrALAZINE 25 MG tablet Commonly known as:  APRESOLINE   HYDROcodone-acetaminophen 5-325 MG tablet Commonly known as:  NORCO/VICODIN   irbesartan 300 MG tablet Commonly known as:  AVAPRO   PROBIOTIC DAILY PO   SENNA PLUS 8.6-50 MG tablet Generic drug:  senna-docusate   tamsulosin 0.4 MG Caps capsule Commonly known as:  FLOMAX   temazepam 15 MG capsule Commonly known as:  RESTORIL     TAKE these medications   chlorproMAZINE 25 MG tablet Commonly known as:  THORAZINE Take 1 tablet (25 mg total) by mouth 4 (four) times daily as needed for nausea (agitation).  cloNIDine 0.2 mg/24hr patch Commonly known as:  CATAPRES-TTS-2 Place 1 patch (0.2 mg total) onto the skin once a week.   furosemide 20 MG tablet Commonly known as:  LASIX Take 1 tablet (20 mg total) by mouth daily.   HYDROmorphone 4 MG tablet Commonly known as:  DILAUDID Take 1 tablet (4 mg total) by mouth every 6 (six) hours.   HYDROmorphone 4 MG tablet Commonly  known as:  DILAUDID Take 1 tablet (4 mg total) by mouth every 2 (two) hours as needed for moderate pain or severe pain (BREAKTHROUGH PAIN).   LORazepam 2 MG/ML concentrated solution Commonly known as:  LORAZEPAM INTENSOL Take 0.5 mLs (1 mg total) by mouth every 6 (six) hours.   LORazepam 2 MG/ML concentrated solution Commonly known as:  LORAZEPAM INTENSOL Place 0.5 mLs (1 mg total) under the tongue every 2 (two) hours as needed for anxiety, seizure, sedation or sleep.   nitroGLYCERIN 0.4 MG SL tablet Commonly known as:  NITROSTAT Place 1 tablet (0.4 mg total) under the tongue every 5 (five) minutes as needed for chest pain.   pantoprazole 40 MG tablet Commonly known as:  PROTONIX Take 40 mg by mouth 3 (three) times a week.   QUEtiapine 100 MG tablet Commonly known as:  SEROQUEL Take 100 mg by mouth at bedtime.       Consultations:  Palliative care  Gastroenterology  Procedures/Studies:  2D Echo: Not obtained this admission.  Dg Chest 2 View  Result Date: 08/21/2018 CLINICAL DATA:  Sepsis EXAM: CHEST - 2 VIEW COMPARISON:  04/06/2018 FINDINGS: Prior CABG. Heart is normal size. Concern for patchy opacity in the right upper lobe concerning for pneumonia. Left lung clear. No effusions or acute bony abnormality. IMPRESSION: Concern for right upper lobe infiltrate/pneumonia. Electronically Signed   By: Rolm Baptise M.D.   On: 08/21/2018 19:49   Dg Abd 1 View  Result Date: 08/22/2018 CLINICAL DATA:  Left lower quadrant abdominal pain. EXAM: ABDOMEN - 1 VIEW COMPARISON:  Abdominal x-ray dated August 17, 2018. FINDINGS: Normal bowel gas pattern. Unchanged 7 mm left renal calculus. No acute osseous abnormality. IMPRESSION: 1. No acute findings. 2. Unchanged left nephrolithiasis. Electronically Signed   By: Titus Dubin M.D.   On: 08/22/2018 09:04   Ct Abdomen Pelvis W Contrast  Result Date: 08/09/2018 CLINICAL DATA:  Right-sided abdominal pain with elevated white blood cell  count EXAM: CT ABDOMEN AND PELVIS WITH CONTRAST TECHNIQUE: Multidetector CT imaging of the abdomen and pelvis was performed using the standard protocol following bolus administration of intravenous contrast. CONTRAST:  127m ISOVUE-300 IOPAMIDOL (ISOVUE-300) INJECTION 61% COMPARISON:  11/14/17 FINDINGS: Lower chest: Lung bases are free of acute infiltrate or sizable effusion. A few tiny stable nodules are noted from prior exam of 11/14/2017 Hepatobiliary: Gallbladder is been surgically removed. The liver is within normal limits. Pancreas: Unremarkable. No pancreatic ductal dilatation or surrounding inflammatory changes. Spleen: Normal in size without focal abnormality. Adrenals/Urinary Tract: Adrenal glands are within normal limits bilaterally. Nonobstructing left lower pole renal stone is noted measuring 7 mm. The collecting systems and ureters are prominent although no obstructing lesions are seen. The bladder is well distended likely contributing to the fullness bilaterally. Prior TURP defect is noted within the prostate. Stomach/Bowel: The appendix has been surgically removed. Diverticular change of the colon is noted without evidence of diverticulitis. Small bowel is within normal limits with the exception of a prominent duodenal diverticulum adjacent to the head of the pancreas. Distal stomach demonstrates mild edematous changes  consistent with the given clinical history of gastritis. No definitive ulceration is noted. Vascular/Lymphatic: Aortic atherosclerosis. No enlarged abdominal or pelvic lymph nodes. Reproductive: TURP defect is noted within the prostate. Other: No abdominal wall hernia or abnormality. No abdominopelvic ascites. Musculoskeletal: Degenerative changes of lumbar spine are noted IMPRESSION: Nonobstructing left renal stone is noted. Fullness of the collecting systems is noted bilaterally related to the distended bladder. Diverticular change without evidence of diverticulitis. Electronically  Signed   By: Inez Catalina M.D.   On: 08/09/2018 02:38     Subjective: No major events overnight of this morning.  Wife states that he has not had good sleep until early this morning.  Denies pain.    Discharge Exam: Vitals:   08/23/18 0451 08/23/18 0745  BP: (!) 176/80 (!) 160/84  Pulse: 80 67  Resp: 16 20  Temp: 98.6 F (37 C) 98.9 F (37.2 C)  SpO2: 97% 98%    GENERAL: Appears well. No acute distress.  EXT:  no edema bilaterally.  SKIN: no apparent skin lesion.  NEURO: Awake, alert and oriented to self and familiar face. PSYCH: Calm.   The results of significant diagnostics from this hospitalization (including imaging, microbiology, ancillary and laboratory) are listed below for reference.     Microbiology: Recent Results (from the past 240 hour(s))  Culture, blood (Routine x 2)     Status: None (Preliminary result)   Collection Time: 08/21/18  7:27 PM  Result Value Ref Range Status   Specimen Description   Final    BLOOD RIGHT ANTECUBITAL Performed at [facility] [address], [place], [place] [address]    Special Requests   Final    BOTTLES DRAWN AEROBIC AND ANAEROBIC Blood Culture adequate volume Performed at [facility] [address] [place], [place] [address]    Culture  Setup Time   Final    GRAM NEGATIVE RODS AEROBIC BOTTLE ONLY Organism ID to follow CRITICAL RESULT CALLED TO, READ BACK BY AND VERIFIED WITH: N. Freeland, AT [id number] 08/23/18 BY Rush Landmark Performed at [facility], [place] [address], [place], [place] [address]    Culture GRAM NEGATIVE RODS  Final   Report Status PENDING  Incomplete  Culture, blood (Routine x 2)     Status: None (Preliminary result)   Collection Time: 08/21/18  7:27 PM  Result Value Ref Range Status   Specimen Description   Final    BLOOD LEFT ANTECUBITAL Performed at [facility] [address], [place], [place] [address]    Special Requests    Final    BOTTLES DRAWN AEROBIC AND ANAEROBIC Blood Culture adequate volume Performed at [facility] [address], [place], [place] [address]    Culture   Final    NO GROWTH < 24 HOURS Performed at [facility] [address], [place], [place] [address]    Report Status PENDING  Incomplete  Blood Culture ID Panel (Reflexed)     Status: Abnormal   Collection Time: 08/21/18  7:27 PM  Result Value Ref Range Status   Enterococcus species NOT DETECTED NOT DETECTED Final   Listeria monocytogenes NOT DETECTED NOT DETECTED Final   Staphylococcus species NOT DETECTED NOT DETECTED Final   Staphylococcus aureus (BCID) NOT DETECTED NOT DETECTED Final   Streptococcus species NOT DETECTED NOT DETECTED Final   Streptococcus agalactiae NOT DETECTED NOT DETECTED Final   Streptococcus pneumoniae NOT DETECTED NOT DETECTED Final  Streptococcus pyogenes NOT DETECTED NOT DETECTED Final   Acinetobacter baumannii NOT DETECTED NOT DETECTED Final   Enterobacteriaceae species NOT DETECTED NOT DETECTED Final   Enterobacter cloacae complex NOT DETECTED NOT DETECTED Final   Escherichia coli NOT DETECTED NOT DETECTED Final   Klebsiella oxytoca NOT DETECTED NOT DETECTED Final   Klebsiella pneumoniae NOT DETECTED NOT DETECTED Final   Proteus species NOT DETECTED NOT DETECTED Final   Serratia marcescens NOT DETECTED NOT DETECTED Final   Carbapenem resistance NOT DETECTED NOT DETECTED Final   Haemophilus influenzae NOT DETECTED NOT DETECTED Final   Neisseria meningitidis NOT DETECTED NOT DETECTED Final   Pseudomonas aeruginosa DETECTED (A) NOT DETECTED Final    Comment: CRITICAL RESULT CALLED TO, READ BACK BY AND VERIFIED WITH: Guadlupe Spanish PHARMD, AT 3710 08/23/18 BY D. VANHOOK    Candida albicans NOT DETECTED NOT DETECTED Final   Candida glabrata NOT DETECTED NOT DETECTED Final   Candida krusei NOT DETECTED NOT DETECTED Final   Candida parapsilosis NOT DETECTED NOT DETECTED Final    Candida tropicalis NOT DETECTED NOT DETECTED Final    Comment: Performed at [facility], [place] [address] [place], [place] [address]  Urine culture     Status: Abnormal   Collection Time: 08/21/18  7:53 PM  Result Value Ref Range Status   Specimen Description   Final    URINE, RANDOM Performed at [facility] [address] [place], [place] [address]    Special Requests   Final    NONE Performed at [facility], [place] [address] [place], [place] [address]    Culture MULTIPLE SPECIES PRESENT, SUGGEST RECOLLECTION (A)  Final   Report Status 08/23/2018 FINAL  Final  MRSA PCR Screening     Status: None   Collection Time: 08/22/18 11:03 AM  Result Value Ref Range Status   MRSA by PCR NEGATIVE NEGATIVE Final    Comment:        The GeneXpert MRSA Assay (FDA approved for NASAL specimens only), is one component of a comprehensive MRSA colonization surveillance program. It is not intended to diagnose MRSA infection nor to guide or monitor treatment for MRSA infections. Performed at [facility], [place] [address], [place], [place] [address]      Labs: BNP (last 3 results) Recent Labs    11/15/17 0231  BNP 829.9*   Basic Metabolic Panel: Recent Labs  Lab 08/21/18 1927 08/22/18 0324  NA 141 142  K 3.3* 4.1  CL 112* 114*  CO2 23 25  GLUCOSE 156* 164*  BUN 28* 24*  CREATININE 1.26* 1.43*  CALCIUM 8.7* 8.2*   Liver Function Tests: Recent Labs  Lab 08/21/18 1927  AST 20  ALT 19  ALKPHOS 89  BILITOT 0.7  PROT 6.5  ALBUMIN 3.4*   Recent Labs  Lab 08/21/18 1953  LIPASE 35   No results for input(s): AMMONIA in the last 168 hours. CBC: Recent Labs  Lab 08/21/18 1927 08/22/18 0324 08/22/18 1124  WBC 8.9 9.7  --   NEUTROABS 7.3  --   --   HGB 11.0* 9.9* 9.6*  HCT 31.4* 29.6* 28.0*  MCV 92.4 95.2  --   PLT 127* 113*  --    Cardiac Enzymes: No results for input(s): CKTOTAL, CKMB, CKMBINDEX,  TROPONINI in the last 168 hours. BNP: Invalid input(s): POCBNP CBG: No results for input(s): GLUCAP in the last 168 hours. D-Dimer No results for input(s): DDIMER in the last 72 hours.  Hgb A1c No results for input(s): HGBA1C in the last 72 hours. Lipid Profile No results for input(s): CHOL, HDL, LDLCALC, TRIG, CHOLHDL, LDLDIRECT in the last 72 hours. Thyroid function studies No results for input(s): TSH, T4TOTAL, T3FREE, THYROIDAB in the last 72 hours.  Invalid input(s): FREET3 Anemia work up No results for input(s): VITAMINB12, FOLATE, FERRITIN, TIBC, IRON, RETICCTPCT in the last 72 hours. Urinalysis    Component Value Date/Time   COLORURINE YELLOW 08/21/2018 1927   APPEARANCEUR HAZY (A) 08/21/2018 1927   LABSPEC 1.018 08/21/2018 1927   PHURINE 6.0 08/21/2018 1927   GLUCOSEU NEGATIVE 08/21/2018 1927   HGBUR MODERATE (A) 08/21/2018 1927   BILIRUBINUR NEGATIVE 08/21/2018 [place] 08/21/2018 1927   PROTEINUR 30 (A) 08/21/2018 1927   UROBILINOGEN 1.0 10/11/2014 1609   NITRITE NEGATIVE 08/21/2018 1927   LEUKOCYTESUR LARGE (A) 08/21/2018 1927   Sepsis Labs Invalid input(s): PROCALCITONIN,  WBC,  LACTICIDVEN   Time coordinating discharge: 35 minutes  SIGNED:  Mercy Riding, MD  [facility] 08/23/2018, 9:51 AM Pager [phone]  If 7PM-7AM, please contact night-coverage [email or web address] Password [email or web address]

## 2018-08-23 NOTE — Progress Notes (Signed)
-  Patient has been transitioned to comfort care.  EGD canceled.  GI will sign off.  Otis Brace MD, [place] 08/23/2018, 10:41 AM  Contact #  [phone]

## 2018-08-23 NOTE — Care Management Note (Signed)
Case Management Note  Patient Details  Name: Darryl [name] MRN: [id number] Date of Birth: August 29, 1934  Subjective/Objective:                  discharged  Action/Plan: Discharged to home with self-care, orders checked for hhc needs. No CM needs present at time of discharge.  Patient is able to arrangement own appointments and home care.  Expected Discharge Date:  08/23/18               Expected Discharge Plan:  Home/Self Care  In-House Referral:     Discharge planning Services  CM Consult  Post Acute Care Choice:    Choice offered to:     DME Arranged:    DME Agency:     HH Arranged:    HH Agency:     Status of Service:  Completed, signed off  If discussed at [employer] of Stay Meetings, dates discussed:    Additional Comments:  Leeroy Cha, RN 08/23/2018, 11:24 AM

## 2018-08-23 NOTE — Progress Notes (Signed)
Palliative Care Progress Note  After discussion with his family yesterday a plan has been made to focus on the patient's comfort as the primary goal and to not intervene with aggressive medical interventions to prolong his current state of suffering and functional status decline related to dementia and progressive chronic illness.  Plan is to transfer to [facility] for terminal care in the setting of sepsis related to PNA, UTI and possible GIB (dark emesis led to current admission and dropping Hb). He has been given IV fluids and IV antibiotics since admission but remains unresponsive and at times agitated and combative.Discontinued antibiotics yesterday and fluids. He was also severely constipated and impacted- requested nursing give dulcolax suppository and check for impaction. I suspect he will have <2 weeks prognosis.  Recommendations:  1. He did well with both ativan and hydromorphone yesterday.Will switch to sublingual scheduled and PRN doses. Updated discharge medications.  2. Thorazine and Haldol for agitation and Nausea.  3. Maintain Foley indefinitely.  4. Aggressive bowel regime as needed.  Lane Hacker, DO Palliative Medicine  Time: 35 min Greater than 50%  of this time was spent counseling and coordinating care related to the above assessment and plan.

## 2018-08-23 NOTE — Progress Notes (Signed)
LCSW consulted for residential hospice referral.  LCSW made [facility] referral.  LCSW will continue to follow.  Carolin Coy [place]

## 2018-08-23 NOTE — Progress Notes (Signed)
PHARMACY - PHYSICIAN COMMUNICATION CRITICAL VALUE ALERT - BLOOD CULTURE IDENTIFICATION (BCID)  Darryl [name] is an 84 y.o. male who presented to [facility] on 08/21/2018 with a chief complaint of PNA  Assessment:  1 of 4 bottles growing pseudomonas  Name of physician (or Provider) ContactedCyndia Skeeters  Current antibiotics: none  Changes to prescribed antibiotics recommended:  Patient is to be discharged today to [facility], All antibiotics d/c'd yesterday with no plans to resume anything  Results for orders placed or performed during the hospital encounter of 08/21/18  Blood Culture ID Panel (Reflexed) (Collected: 08/21/2018  7:27 PM)  Result Value Ref Range   Enterococcus species NOT DETECTED NOT DETECTED   Listeria monocytogenes NOT DETECTED NOT DETECTED   Staphylococcus species NOT DETECTED NOT DETECTED   Staphylococcus aureus (BCID) NOT DETECTED NOT DETECTED   Streptococcus species NOT DETECTED NOT DETECTED   Streptococcus agalactiae NOT DETECTED NOT DETECTED   Streptococcus pneumoniae NOT DETECTED NOT DETECTED   Streptococcus pyogenes NOT DETECTED NOT DETECTED   Acinetobacter baumannii NOT DETECTED NOT DETECTED   Enterobacteriaceae species NOT DETECTED NOT DETECTED   Enterobacter cloacae complex NOT DETECTED NOT DETECTED   Escherichia coli NOT DETECTED NOT DETECTED   Klebsiella oxytoca NOT DETECTED NOT DETECTED   Klebsiella pneumoniae NOT DETECTED NOT DETECTED   Proteus species NOT DETECTED NOT DETECTED   Serratia marcescens NOT DETECTED NOT DETECTED   Carbapenem resistance NOT DETECTED NOT DETECTED   Haemophilus influenzae NOT DETECTED NOT DETECTED   Neisseria meningitidis NOT DETECTED NOT DETECTED   Pseudomonas aeruginosa DETECTED (A) NOT DETECTED   Candida albicans NOT DETECTED NOT DETECTED   Candida glabrata NOT DETECTED NOT DETECTED   Candida krusei NOT DETECTED NOT DETECTED   Candida parapsilosis NOT DETECTED NOT DETECTED   Candida tropicalis NOT DETECTED NOT  DETECTED    Kara Mead 08/23/2018  10:30 AM

## 2018-08-23 NOTE — Progress Notes (Signed)
COMMUNITY PALLIATIVE CARE RN NOTE  PATIENT NAME: Darryl [name] DOB: March 13, 1935 MRN: [id number]  PRIMARY CARE PROVIDER: [name], MD  RESPONSIBLE PARTY: Barbra Sarks (wife) Acct ID - Guarantor Home Phone Work Phone Relationship Acct Type  [id number] CORBY, [phone]  Self P/F     [address], [place], [place] [address]    PLAN OF CARE and INTERVENTION:  1. ADVANCE CARE PLANNING/GOALS OF CARE: Wife wants patient to remain at home as long as possible. He is a DNR. 2. PATIENT/CAREGIVER EDUCATION: Reinforced Safe Mobility/Transfers and Fall Prevention 3. DISEASE STATUS: Joint visit made with Palliative Care SW, Lynn Duffy. Patient sitting up in recliner in living room awake and alert. He is able to answer simple questions, but intermittently confused. He denies pain and says repeatedly, "I feel good." On 08/08/18 wife took patient to the ED d/t c/o groin pain. He was found to have a UTI and urinary retention. A foley catheter was placed and antibiotics started. Recently, the catheter was removed for a trial, however he began experiencing retention again and catheter had to be replaced. Patient had an appointment with Urology yesterday. Wife was told that patient also has a kidney stone, however it is not obstructing anything per scans. Wife reports patient has had some nausea and vomiting and temp of 100.9 yesterday. Visit today, temp was 99.7 and she had just recently given patient some Tylenol. No vomiting today, however patient was weak this am and required transport via his wheelchair. Catheter is draining dark yellow urine. No odor or blood noted. He has another follow up appointment with Urology next Tuesday. Wife has been pushing fluids and intake normal today. Will continue to monitor.   HISTORY OF PRESENT ILLNESS:  This is a 84 yo male who resides at home with his wife. Palliative Care Team continues to follow patient. Visits scheduled every 2 weeks as requested by  wife.  CODE STATUS: DNR ADVANCED DIRECTIVES: Y  MOST FORM: no PPS: 40%   PHYSICAL EXAM:   VITALS: Today's Vitals   08/18/18 1159  BP: 138/64  Pulse: 65  Resp: 18  Temp: 99.7 F (37.6 C)  TempSrc: Temporal  SpO2: 95%  PainSc: 0-No pain    LUNGS: clear to auscultation  CARDIAC: Cor RRR EXTREMITIES: No edema SKIN: Exposed skin is dry and intact  NEURO: Alert and oriented x 2 (person/place), intermittent confusion, generalized weakness, ambulatory with walker, at times must be transported via wheelchair   (Duration of visit and documentation 90 minutes)    [name], RN

## 2018-08-23 NOTE — Progress Notes (Signed)
Manufacturing engineer [facility]) [facility] Liaison note.  Received request from Servando Snare, [place] for family interest in [facility] with request for transfer  today. Chart reviewed and eligibility confirmed. Met with patient and wife, [name] to confirm interest and explain services. Family agreeable to transfer today. CSW aware.  Registration paper work completed.. Dr. Lane Hacker to assume care per family request.  Please fax discharge summary to [phone]. RN please call report to [phone]. Please arrange transport for patient.  Thank you,   Farrel Gordon, RN, [facility]  [facility] are on [place]

## 2018-08-23 NOTE — Progress Notes (Signed)
LCSW following for residential hospice placement.  Patient has bed at [facility].   LCSW faxed dc docs to facility.   Patient will transport by [facility].   RN please call report to [phone].  Carolin Coy [place]

## 2018-08-25 LAB — CULTURE, BLOOD (ROUTINE X 2): SPECIAL REQUESTS: ADEQUATE

## 2018-08-26 LAB — CULTURE, BLOOD (ROUTINE X 2)
Culture: NO GROWTH
SPECIAL REQUESTS: ADEQUATE

## 2018-09-28 DEATH — deceased
